# Patient Record
Sex: Male | Born: 1965 | Race: White | Hispanic: No | Marital: Married | Smoking: Never smoker
Health system: Southern US, Community
[De-identification: ages and names within clinical notes are randomized; demographics above are authoritative.]

## PROBLEM LIST (undated history)

## (undated) DIAGNOSIS — I350 Nonrheumatic aortic (valve) stenosis: Secondary | ICD-10-CM

## (undated) DIAGNOSIS — R112 Nausea with vomiting, unspecified: Secondary | ICD-10-CM

## (undated) DIAGNOSIS — Z9889 Other specified postprocedural states: Secondary | ICD-10-CM

## (undated) DIAGNOSIS — E119 Type 2 diabetes mellitus without complications: Secondary | ICD-10-CM

## (undated) DIAGNOSIS — I10 Essential (primary) hypertension: Secondary | ICD-10-CM

## (undated) HISTORY — DX: Type 2 diabetes mellitus without complications: E11.9

## (undated) HISTORY — DX: Essential (primary) hypertension: I10

## (undated) HISTORY — DX: Nonrheumatic aortic (valve) stenosis: I35.0

---

## 2011-03-25 ENCOUNTER — Ambulatory Visit: Payer: Worker's Compensation

## 2011-04-05 ENCOUNTER — Ambulatory Visit: Payer: Worker's Compensation

## 2011-04-14 ENCOUNTER — Ambulatory Visit: Payer: Worker's Compensation

## 2012-06-01 ENCOUNTER — Ambulatory Visit (INDEPENDENT_AMBULATORY_CARE_PROVIDER_SITE_OTHER): Payer: Self-pay | Admitting: Physician Assistant

## 2012-06-01 VITALS — BP 120/82 | HR 71 | Temp 98.4°F | Resp 17 | Ht 65.0 in | Wt 236.0 lb

## 2012-06-01 DIAGNOSIS — Z0289 Encounter for other administrative examinations: Secondary | ICD-10-CM

## 2012-06-01 DIAGNOSIS — E119 Type 2 diabetes mellitus without complications: Secondary | ICD-10-CM

## 2012-06-01 LAB — POCT GLYCOSYLATED HEMOGLOBIN (HGB A1C): Hemoglobin A1C: 6.7

## 2012-06-01 NOTE — Progress Notes (Signed)
  Subjective:    Patient ID: Jonathon Bray, male    DOB: [birth year], 47 y.o.   MRN: [id number]  HPI   Jonathon Bray is a 47 yr old male here for DOT exam.  He is currently treated for DM by his PCP, Dr. Kirstie Mirza.  He is treated with Glucovance only.  Pt is working on his diet, eating out less.  Has lost about 30 lbs in the last several months.  Hopeful he can get off diabetes medication.  States fasting blood sugars run in 120s (previoulsy 160s-170s).  His next appt with PCP is 06/22/2012, states he follows up every three months.  He does not wear corrective lenses.  Eye doctor appt next month as well.  Denies any other medical problems or medications.    Pt drives a service truck locally.   Review of Systems  All other systems reviewed and are negative.       Objective:   Physical Exam  Vitals reviewed. Constitutional: He is oriented to person, place, and time. He appears well-developed and well-nourished. No distress.  HENT:  Head: Normocephalic and atraumatic.  Right Ear: Tympanic membrane and ear canal normal.  Left Ear: Tympanic membrane and ear canal normal.  Mouth/Throat: Uvula is midline, oropharynx is clear and moist and mucous membranes are normal.  Eyes: Conjunctivae and EOM are normal. Pupils are equal, round, and reactive to light. No scleral icterus.  Neck: Normal range of motion. Neck supple.  Cardiovascular: Normal rate, regular rhythm, normal heart sounds and intact distal pulses.  Exam reveals no gallop and no friction rub.   No murmur heard. Pulmonary/Chest: Effort normal and breath sounds normal. He has no wheezes. He has no rales.  Abdominal: Soft. Bowel sounds are normal. He exhibits no distension and no mass. There is no tenderness. There is no rebound and no guarding. Hernia confirmed negative in the right inguinal area and confirmed negative in the left inguinal area.  Musculoskeletal: Normal range of motion. He exhibits no edema.       Cervical back: Normal.   Thoracic back: Normal.       Lumbar back: Normal.  Lymphadenopathy:    He has no cervical adenopathy.  Neurological: He is alert and oriented to person, place, and time. He has normal strength. No cranial nerve deficit or sensory deficit.  Skin: Skin is warm and dry.  Psychiatric: His behavior is normal.     Filed Vitals:   06/01/12 0824  BP: 120/82  Pulse: 71  Temp: 98.4 F (36.9 C)  Resp: 17        Assessment & Plan:  Health examination of defined subpopulation  Diabetes mellitus - Plan: POCT glycosylated hemoglobin (Hb A1C), POCT glucose (manual entry)   Jonathon Bray is a 47 yr old male here for DOT exam.  Pt with DM treated with oral agent only.  1000mg  sugar on urine dip.  A1C is 6.7 today indicating adequate control of DM.  PE is WNL.  Ok to certify for 1 yr.  DOT card signed.  Encouraged pt to continue making lifestyle changes and to follow up with primary as planned.

## 2012-06-12 ENCOUNTER — Encounter: Payer: Self-pay | Admitting: Physician Assistant

## 2014-01-24 ENCOUNTER — Inpatient Hospital Stay (HOSPITAL_COMMUNITY): Payer: 59

## 2014-01-24 ENCOUNTER — Inpatient Hospital Stay (HOSPITAL_COMMUNITY)
Admission: EM | Admit: 2014-01-24 | Discharge: 2014-01-25 | DRG: 069 | Disposition: A | Discharge status: Home / Self Care | Payer: 59 | Attending: Internal Medicine | Admitting: Internal Medicine

## 2014-01-24 ENCOUNTER — Encounter (HOSPITAL_COMMUNITY): Payer: Self-pay | Admitting: Emergency Medicine

## 2014-01-24 ENCOUNTER — Emergency Department (HOSPITAL_COMMUNITY): Payer: 59

## 2014-01-24 DIAGNOSIS — D45 Polycythemia vera: Secondary | ICD-10-CM | POA: Diagnosis present

## 2014-01-24 DIAGNOSIS — E119 Type 2 diabetes mellitus without complications: Secondary | ICD-10-CM

## 2014-01-24 DIAGNOSIS — G459 Transient cerebral ischemic attack, unspecified: Secondary | ICD-10-CM | POA: Diagnosis present

## 2014-01-24 DIAGNOSIS — E860 Dehydration: Secondary | ICD-10-CM | POA: Diagnosis present

## 2014-01-24 DIAGNOSIS — E781 Pure hyperglyceridemia: Secondary | ICD-10-CM | POA: Diagnosis present

## 2014-01-24 DIAGNOSIS — I10 Essential (primary) hypertension: Secondary | ICD-10-CM | POA: Diagnosis present

## 2014-01-24 DIAGNOSIS — H538 Other visual disturbances: Secondary | ICD-10-CM | POA: Diagnosis present

## 2014-01-24 DIAGNOSIS — E1165 Type 2 diabetes mellitus with hyperglycemia: Secondary | ICD-10-CM | POA: Diagnosis present

## 2014-01-24 DIAGNOSIS — R531 Weakness: Secondary | ICD-10-CM

## 2014-01-24 DIAGNOSIS — R29898 Other symptoms and signs involving the musculoskeletal system: Secondary | ICD-10-CM

## 2014-01-24 DIAGNOSIS — R42 Dizziness and giddiness: Secondary | ICD-10-CM | POA: Diagnosis present

## 2014-01-24 DIAGNOSIS — Z79899 Other long term (current) drug therapy: Secondary | ICD-10-CM

## 2014-01-24 DIAGNOSIS — I639 Cerebral infarction, unspecified: Secondary | ICD-10-CM

## 2014-01-24 LAB — URINALYSIS, ROUTINE W REFLEX MICROSCOPIC
Bilirubin Urine: NEGATIVE
Hgb urine dipstick: NEGATIVE
Ketones, ur: NEGATIVE mg/dL
LEUKOCYTES UA: NEGATIVE
Nitrite: NEGATIVE
PH: 5 (ref 5.0–8.0)
Protein, ur: NEGATIVE mg/dL
Specific Gravity, Urine: 1.045 — ABNORMAL HIGH (ref 1.005–1.030)
Urobilinogen, UA: 0.2 mg/dL (ref 0.0–1.0)

## 2014-01-24 LAB — BASIC METABOLIC PANEL
Anion gap: 12 (ref 5–15)
BUN: 20 mg/dL (ref 6–23)
CO2: 26 meq/L (ref 19–32)
Calcium: 9.7 mg/dL (ref 8.4–10.5)
Chloride: 101 mEq/L (ref 96–112)
Creatinine, Ser: 0.9 mg/dL (ref 0.50–1.35)
GFR calc Af Amer: >90 mL/min (ref >90)
GFR calc non Af Amer: >90 mL/min (ref >90)
GLUCOSE: 249 mg/dL — AB (ref 70–99)
POTASSIUM: 4.9 meq/L (ref 3.7–5.3)
Sodium: 139 mEq/L (ref 137–147)

## 2014-01-24 LAB — CBC WITH DIFFERENTIAL/PLATELET
BASOS PCT: 0 % (ref 0–1)
Basophils Absolute: 0 10*3/uL (ref 0.0–0.1)
Eosinophils Absolute: 0.3 10*3/uL (ref 0.0–0.7)
Eosinophils Relative: 4 % (ref 0–5)
HCT: 50.7 % (ref 39.0–52.0)
HEMOGLOBIN: 18.5 g/dL — AB (ref 13.0–17.0)
LYMPHS ABS: 1.5 10*3/uL (ref 0.7–4.0)
LYMPHS PCT: 20 % (ref 12–46)
MCH: 30.7 pg (ref 26.0–34.0)
MCHC: 36.5 g/dL — AB (ref 30.0–36.0)
MCV: 84.2 fL (ref 78.0–100.0)
MONOS PCT: 6 % (ref 3–12)
Monocytes Absolute: 0.4 10*3/uL (ref 0.1–1.0)
NEUTROS PCT: 70 % (ref 43–77)
Neutro Abs: 5.1 10*3/uL (ref 1.7–7.7)
Platelets: 190 10*3/uL (ref 150–400)
RBC: 6.02 MIL/uL — AB (ref 4.22–5.81)
RDW: 12.4 % (ref 11.5–15.5)
WBC: 7.3 10*3/uL (ref 4.0–10.5)

## 2014-01-24 LAB — RAPID URINE DRUG SCREEN, HOSP PERFORMED
Amphetamines: NOT DETECTED
BENZODIAZEPINES: NOT DETECTED
Barbiturates: NOT DETECTED
Cocaine: NOT DETECTED
Opiates: NOT DETECTED
Tetrahydrocannabinol: NOT DETECTED

## 2014-01-24 LAB — I-STAT CHEM 8, ED
BUN: 22 mg/dL (ref 6–23)
CREATININE: 1 mg/dL (ref 0.50–1.35)
Calcium, Ion: 1.26 mmol/L — ABNORMAL HIGH (ref 1.12–1.23)
Chloride: 102 mEq/L (ref 96–112)
Glucose, Bld: 251 mg/dL — ABNORMAL HIGH (ref 70–99)
HEMATOCRIT: 55 % — AB (ref 39.0–52.0)
HEMOGLOBIN: 18.7 g/dL — AB (ref 13.0–17.0)
POTASSIUM: 4.4 meq/L (ref 3.7–5.3)
SODIUM: 139 meq/L (ref 137–147)
TCO2: 27 mmol/L (ref 0–100)

## 2014-01-24 LAB — GLUCOSE, CAPILLARY
Glucose-Capillary: 236 mg/dL — ABNORMAL HIGH (ref 70–99)
Glucose-Capillary: 262 mg/dL — ABNORMAL HIGH (ref 70–99)

## 2014-01-24 LAB — I-STAT TROPONIN, ED: Troponin i, poc: 0 ng/mL (ref 0.00–0.08)

## 2014-01-24 LAB — PROTIME-INR
INR: 1.06 (ref 0.00–1.49)
Prothrombin Time: 13.9 seconds (ref 11.6–15.2)

## 2014-01-24 LAB — URINE MICROSCOPIC-ADD ON
RBC / HPF: NONE SEEN RBC/hpf (ref <3)
WBC, UA: NONE SEEN WBC/hpf (ref <3)

## 2014-01-24 LAB — HEMOGLOBIN A1C
Hgb A1c MFr Bld: 7.8 % — ABNORMAL HIGH (ref <5.7)
MEAN PLASMA GLUCOSE: 177 mg/dL — AB (ref <117)

## 2014-01-24 LAB — ETHANOL: Alcohol, Ethyl (B): <11 mg/dL (ref 0–11)

## 2014-01-24 LAB — APTT: APTT: 27 s (ref 24–37)

## 2014-01-24 MED ORDER — INSULIN ASPART 100 UNIT/ML ~~LOC~~ SOLN
0.0000 [IU] | Freq: Three times a day (TID) | SUBCUTANEOUS | Status: DC
  Administered 2014-01-24: 3 [IU] via SUBCUTANEOUS
  Administered 2014-01-25: 2 [IU] via SUBCUTANEOUS
  Administered 2014-01-25 (×2): 3 [IU] via SUBCUTANEOUS

## 2014-01-24 MED ORDER — IOHEXOL 350 MG/ML SOLN
80.0000 mL | Freq: Once | INTRAVENOUS | Status: AC | PRN
  Administered 2014-01-24: 80 mL via INTRAVENOUS

## 2014-01-24 MED ORDER — SODIUM CHLORIDE 0.9 % IV SOLN
Freq: Once | INTRAVENOUS | Status: AC
  Administered 2014-01-24: 11:00:00 via INTRAVENOUS

## 2014-01-24 MED ORDER — STROKE: EARLY STAGES OF RECOVERY BOOK
Freq: Once | Status: AC
  Administered 2014-01-25: 08:00:00
  Filled 2014-01-24 (×2): qty 1

## 2014-01-24 MED ORDER — ASPIRIN 325 MG PO TABS
325.0000 mg | ORAL_TABLET | Freq: Once | ORAL | Status: AC
  Administered 2014-01-24: 325 mg via ORAL
  Filled 2014-01-24: qty 1

## 2014-01-24 MED ORDER — METOCLOPRAMIDE HCL 5 MG/ML IJ SOLN
10.0000 mg | Freq: Once | INTRAMUSCULAR | Status: AC
  Administered 2014-01-24: 10 mg via INTRAVENOUS
  Filled 2014-01-24: qty 2

## 2014-01-24 MED ORDER — HEPARIN SODIUM (PORCINE) 5000 UNIT/ML IJ SOLN
5000.0000 [IU] | Freq: Three times a day (TID) | INTRAMUSCULAR | Status: DC
  Administered 2014-01-24 – 2014-01-25 (×3): 5000 [IU] via SUBCUTANEOUS
  Filled 2014-01-24 (×3): qty 1

## 2014-01-24 MED ORDER — ASPIRIN EC 81 MG PO TBEC
81.0000 mg | DELAYED_RELEASE_TABLET | Freq: Every day | ORAL | Status: DC
Start: 2014-01-24 — End: 2014-01-24

## 2014-01-24 MED ORDER — ASPIRIN EC 81 MG PO TBEC
81.0000 mg | DELAYED_RELEASE_TABLET | Freq: Every day | ORAL | Status: DC
  Administered 2014-01-25: 81 mg via ORAL
  Filled 2014-01-24: qty 1

## 2014-01-24 MED ORDER — ACETAMINOPHEN 325 MG PO TABS
650.0000 mg | ORAL_TABLET | Freq: Once | ORAL | Status: AC
  Administered 2014-01-24: 650 mg via ORAL
  Filled 2014-01-24: qty 2

## 2014-01-24 NOTE — ED Notes (Signed)
Patient states dizziness while driving this am, patient states once he pulled over he could not stand due to leg weakness, at time of arrival all symptoms improved except patient now c/o posterior headache and right arm heaviness, no drift noted, +pms in all extremities,

## 2014-01-24 NOTE — ED Notes (Signed)
Patient transported to MRI 

## 2014-01-24 NOTE — ED Notes (Signed)
Pt c/o near syncope with dizziness today while driving; pt sts pain in right arm and neck

## 2014-01-24 NOTE — ED Provider Notes (Signed)
CSN: [id number]     Arrival date & time 01/24/14  9381 History   First MD Initiated Contact with Patient 01/24/14 0912     Chief Complaint  Patient presents with  . Near Syncope  . Dizziness     HPI Patient states he was in his normal state of health this morning when he was driving down the road and felt a sudden sense of spinning and then "darkness" came over him.  He did not have syncope.  No chest pain or palpitations.  Pulled his car over quickly in an attempt to get out of his car at which point he fell forward secondary to "dizziness".  He now reports weakness and heaviness of his right arm.  He denies weakness of his lower extremities.  He reports mild headache.  He also reports some posterior neck and head pain.  He's never had symptoms like this before.  He has a history of hypertension and diabetes and is compliant with his medications.  He has recently lost 30 pounds over the past year.  He does not smoke cigarettes.  No prior history of stroke.  No history of headaches.  He does report headache at this time.  Last known well time equals 7 AM   Past Medical History  Diagnosis Date  . 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 Hypertension    History reviewed. No pertinent past surgical history. History reviewed. No pertinent family history. History  Substance Use Topics  . Smoking status: Never Smoker   . Smokeless tobacco: Never Used  . Alcohol Use: No    Review of Systems  All other systems reviewed and are negative.     Allergies  Review of patient's allergies indicates no known allergies.  Home Medications   Prior to Admission medications   Medication Sig Start Date End Date Taking? Authorizing Provider  Canagliflozin (INVOKANA) 300 MG TABS Take 300 mg by mouth daily.   Yes Historical Provider, MD  lisinopril (PRINIVIL,ZESTRIL) 5 MG tablet Take 2.5 mg by mouth every other day.   Yes Historical Provider, MD  metFORMIN (GLUCOPHAGE) 500 MG tablet  Take 500-1,000 mg by mouth 2 (two) times daily with a meal. 1000mg  every morning , and 500mg  in the evening with meals.   Yes Historical Provider, MD   BP 136/89  Pulse 89  Temp(Src) 97.2 F (36.2 C) (Oral)  Resp 18  Ht 5\' 5"  (1.651 m)  SpO2 96% Physical Exam  Nursing note and vitals reviewed. Constitutional: He is oriented to person, place, and time. He appears well-developed and well-nourished.  HENT:  Head: Normocephalic and atraumatic.  Eyes: EOM are normal. Pupils are equal, round, and reactive to light.  Neck: Normal range of motion.  Cardiovascular: Normal rate, regular rhythm, normal heart sounds and intact distal pulses.   Pulmonary/Chest: Effort normal and breath sounds normal. No respiratory distress.  Abdominal: Soft. He exhibits no distension. There is no tenderness.  Musculoskeletal: Normal range of motion.  Neurological: He is alert and oriented to person, place, and time.  Mild weakness of his right upper extremity as compared to his left.  No obvious pronator drift Speech normal. No facial asymetry.   Skin: Skin is warm and dry.  Psychiatric: He has a normal mood and affect. Judgment normal.    ED Course  Procedures (including critical care time)  CRITICAL CARE Performed by: Hoy Morn Total critical care time: 33 Critical care time was exclusive of separately billable  procedures and treating other patients. Critical care was necessary to treat or prevent imminent or life-threatening deterioration. Critical care was time spent personally by me on the following activities: development of treatment plan with patient and/or surrogate as well as nursing, discussions with consultants, evaluation of patient's response to treatment, examination of patient, obtaining history from patient or surrogate, ordering and performing treatments and interventions, ordering and review of laboratory studies, ordering and review of radiographic studies, pulse oximetry and  re-evaluation of patient's condition.   Labs Review Labs Reviewed  BASIC METABOLIC PANEL - Abnormal; Notable for the following:    Glucose, Bld 249 (*)    All other components within normal limits  CBC WITH DIFFERENTIAL - Abnormal; Notable for the following:    RBC 6.02 (*)    Hemoglobin 18.5 (*)    MCHC 36.5 (*)    All other components within normal limits  I-STAT CHEM 8, ED - Abnormal; Notable for the following:    Glucose, Bld 251 (*)    Calcium, Ion 1.26 (*)    Hemoglobin 18.7 (*)    HCT 55.0 (*)    All other components within normal limits  ETHANOL  PROTIME-INR  APTT  URINE RAPID DRUG SCREEN (HOSP PERFORMED)  URINALYSIS, ROUTINE W REFLEX MICROSCOPIC  I-STAT TROPOININ, ED  I-STAT TROPOININ, ED  Randolm Idol, ED    Imaging Review Dg Chest 2 View  01/24/2014   CLINICAL DATA:  Near syncope, dizziness.  EXAM: CHEST  2 VIEW  COMPARISON:  None.  FINDINGS: The heart size and mediastinal contours are within normal limits. Both lungs are clear. No pneumothorax or pleural effusion is noted. The visualized skeletal structures are unremarkable.  IMPRESSION: No acute cardiopulmonary abnormality seen.   Electronically Signed   By: Sabino Dick M.D.   On: 01/24/2014 09:10   Ct Head Wo Contrast  01/24/2014   CLINICAL DATA:  Code stroke, right arm weakness, near syncope  EXAM: CT HEAD WITHOUT CONTRAST  TECHNIQUE: Contiguous axial images were obtained from the base of the skull through the vertex without intravenous contrast.  COMPARISON:  None.  FINDINGS: There is no evidence of mass effect, midline shift or extra-axial fluid collections. There is no evidence of a space-occupying lesion or intracranial hemorrhage. There is no evidence of a cortical-based area of acute infarction.  The ventricles and sulci are appropriate for the patient's age. The basal cisterns are patent.  Visualized portions of the orbits are unremarkable. The visualized portions of the paranasal sinuses and mastoid air  cells are unremarkable.  The osseous structures are unremarkable.  IMPRESSION: No acute intracranial pathology. These results were called by telephone at the time of interpretation on 01/24/2014 at 9:54 am to Dr. Aram Beecham, who verbally acknowledged these results.   Electronically Signed   By: Kathreen Devoid   On: 01/24/2014 09:57  I personally reviewed the imaging tests through PACS system I reviewed available ER/hospitalization records through the EMR    EKG Interpretation   Date/Time:  Thursday January 24 2014 08:45:24 EDT Ventricular Rate:  89 PR Interval:  150 QRS Duration: 80 QT Interval:  352 QTC Calculation: 428 R Axis:   103 Text Interpretation:  Normal sinus rhythm Rightward axis Borderline ECG No  significant change was found Confirmed by Amellia Panik  MD, Bridie Colquhoun ([id number]) on  01/24/2014 10:09:09 AM      MDM   Final diagnoses:  Dizziness   Good stroke called on my valuation of the patient given his right arm weakness Initial  head CT negative.  Sent for CTA of the head and neck to evaluate for dissection.  No obvious abnormalities noted.  Patient will be admitted to the hospital for stroke workup.  He appears to be having some improvement in his right arm weakness at this time.  Aspirin will be given.    Hoy Morn, MD 01/24/14 [phone]

## 2014-01-24 NOTE — Consult Note (Signed)
Referring Physician: ED    Chief Complaint: CODE STROKE, RIGHT ARM WEAKNESS, BLURRED VI SON, DIZZINESS, HA  HPI:                                                                                                                                         Jonathon Bray is an 48 y.o. male with a past medical history significant for HTN, DM, brought in via EMS due to acute onset of the above stated symptoms. He [place] the [place] when suddenly experienced blurred vision, dizziness, and heaviness/weakness of the right arm. Stated that at some point he fell and hit his head and ever since has ben having headache and pain in the back of his neck. Denies double vision, difficulty swallowing, slurred speech, confusion, or language impairment. NIHSS 1. CT brain showed no acute intracranial abnormality. Date last known well: 01/24/14 Time last known well:  tPA Given: no, mild deficits NIHSS: 1   Past Medical History  Diagnosis Date  . 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 Hypertension     History reviewed. No pertinent past surgical history.  History reviewed. No pertinent family history. Social History:  reports that he has never smoked. He has never used smokeless tobacco. He reports that he does not drink alcohol or use illicit drugs.  Allergies: No Known Allergies  Medications:                                                                                                                           I have reviewed the patient's current medications.  ROS:  History obtained from the patient and chart review  General ROS: negative for - chills, fatigue, fever, night sweats,or weight loss Psychological ROS: negative for - behavioral disorder, hallucinations, memory difficulties, mood swings or suicidal ideation Ophthalmic ROS:  negative for - blurry vision, double vision, eye pain or loss of vision ENT ROS: negative for - epistaxis, nasal discharge, oral lesions, sore throat, tinnitus or vertigo Allergy and Immunology ROS: negative for - hives or itchy/watery eyes Hematological and Lymphatic ROS: negative for - bleeding problems, bruising or swollen lymph nodes Endocrine ROS: negative for - galactorrhea, hair pattern changes, polydipsia/polyuria or temperature intolerance Respiratory ROS: negative for - cough, hemoptysis, shortness of breath or wheezing Cardiovascular ROS: negative for - chest pain, dyspnea on exertion, edema or irregular heartbeat Gastrointestinal ROS: negative for - abdominal pain, diarrhea, hematemesis, nausea/vomiting or stool incontinence Genito-Urinary ROS: negative for - dysuria, hematuria, incontinence or urinary frequency/urgency Musculoskeletal ROS: negative for - joint swelling Neurological ROS: as noted in HPI Dermatological ROS: negative for rash and skin lesion changes  Physical exam: pleasant male in no apparent distress. Blood pressure 136/89, pulse 89, temperature 97.2 F (36.2 C), temperature source Oral, resp. rate 18, height 5' 5"  (1.651 m), SpO2 96.00%. Head: normocephalic. Neck: supple, no bruits, no JVD. Cardiac: no murmurs. Lungs: clear. Abdomen: soft, no tender, no mass. Extremities: no edema. Neurologic Examination:                                                                                                      General: Mental Status: Alert, oriented, thought content appropriate.  Speech fluent without evidence of aphasia.  Able to follow 3 step commands without difficulty. Cranial Nerves: II: Discs flat bilaterally; Visual fields grossly normal, pupils equal, round, reactive to light and accommodation III,IV, VI: ptosis not present, extra-ocular motions intact bilaterally V,VII: smile asymmetric die to mild right lower face weakness, facial light touch sensation  normal bilaterally VIII: hearing normal bilaterally IX,X: gag reflex present XI: bilateral shoulder shrug XII: midline tongue extension without atrophy or fasciculations Motor: Right : Upper extremity   5/5    Left:     Upper extremity   5/5  Lower extremity   5/5     Lower extremity   5/5 Tone and bulk:normal tone throughout; no atrophy noted Sensory: Pinprick and light touch intact throughout, bilaterally Deep Tendon Reflexes:  Right: Upper Extremity   Left: Upper extremity   biceps (C-5 to C-6) 2/4   biceps (C-5 to C-6) 2/4 tricep (C7) 2/4    triceps (C7) 2/4 Brachioradialis (C6) 2/4  Brachioradialis (C6) 2/4  Lower Extremity Lower Extremity  quadriceps (L-2 to L-4) 2/4   quadriceps (L-2 to L-4) 2/4 Achilles (S1) 2/4   Achilles (S1) 2/4  Plantars: Right: downgoing   Left: downgoing Cerebellar: normal finger-to-nose,  normal heel-to-shin test Gait:  No tested    Results for orders placed during the hospital encounter of 01/24/14 (from the past 48 hour(s))  BASIC METABOLIC PANEL     Status: Abnormal (Preliminary result)   Collection Time    01/24/14  8:59 AM      Result Value Ref Range   Sodium 139  137 - 147 mEq/L   Potassium PENDING  3.7 - 5.3 mEq/L   Chloride 101  96 - 112 mEq/L   CO2 26  19 - 32 mEq/L   Glucose, Bld 249 (*) 70 - 99 mg/dL   BUN 20  6 - 23 mg/dL   Creatinine, Ser 0.90  0.50 - 1.35 mg/dL   Calcium 9.7  8.4 - 10.5 mg/dL   GFR calc non Af Amer >90  >90 mL/min   GFR calc Af Amer >90  >90 mL/min   Comment: (NOTE)     The eGFR has been calculated using the CKD EPI equation.     This calculation has not been validated in all clinical situations.     eGFR's persistently <90 mL/min signify possible Chronic Kidney     Disease.   Anion gap 12  5 - 15  CBC WITH DIFFERENTIAL     Status: Abnormal   Collection Time    01/24/14  8:59 AM      Result Value Ref Range   WBC 7.3  4.0 - 10.5 K/uL   RBC 6.02 (*) 4.22 - 5.81 MIL/uL   Hemoglobin 18.5 (*) 13.0 -  17.0 g/dL   HCT 50.7  39.0 - 52.0 %   MCV 84.2  78.0 - 100.0 fL   MCH 30.7  26.0 - 34.0 pg   MCHC 36.5 (*) 30.0 - 36.0 g/dL   RDW 12.4  11.5 - 15.5 %   Platelets 190  150 - 400 K/uL   Neutrophils Relative % 70  43 - 77 %   Neutro Abs 5.1  1.7 - 7.7 K/uL   Lymphocytes Relative 20  12 - 46 %   Lymphs Abs 1.5  0.7 - 4.0 K/uL   Monocytes Relative 6  3 - 12 %   Monocytes Absolute 0.4  0.1 - 1.0 K/uL   Eosinophils Relative 4  0 - 5 %   Eosinophils Absolute 0.3  0.0 - 0.7 K/uL   Basophils Relative 0  0 - 1 %   Basophils Absolute 0.0  0.0 - 0.1 K/uL  I-STAT TROPOININ, ED     Status: None   Collection Time    01/24/14  9:04 AM      Result Value Ref Range   Troponin i, poc 0.00  0.00 - 0.08 ng/mL   Comment 3            Comment: Due to the release kinetics of cTnI,     a negative result within the first hours     of the onset of symptoms does not rule out     myocardial infarction with certainty.     If myocardial infarction is still suspected,     repeat the test at appropriate intervals.  I-STAT CHEM 8, ED     Status: Abnormal   Collection Time    01/24/14  9:45 AM      Result Value Ref Range   Sodium 139  137 - 147 mEq/L   Potassium 4.4  3.7 - 5.3 mEq/L   Chloride 102  96 - 112 mEq/L   BUN 22  6 - 23 mg/dL   Creatinine, Ser 1.00  0.50 - 1.35 mg/dL   Glucose, Bld 251 (*) 70 - 99 mg/dL   Calcium, Ion 1.26 (*) 1.12 - 1.23 mmol/L   TCO2 27  0 - 100 mmol/L  Hemoglobin 18.7 (*) 13.0 - 17.0 g/dL   HCT 55.0 (*) 39.0 - 52.0 %   Dg Chest 2 View  01/24/2014   CLINICAL DATA:  Near syncope, dizziness.  EXAM: CHEST  2 VIEW  COMPARISON:  None.  FINDINGS: The heart size and mediastinal contours are within normal limits. Both lungs are clear. No pneumothorax or pleural effusion is noted. The visualized skeletal structures are unremarkable.  IMPRESSION: No acute cardiopulmonary abnormality seen.   Electronically Signed   By: Sabino Dick M.D.   On: 01/24/2014 09:10   Ct Head Wo  Contrast  01/24/2014   CLINICAL DATA:  Code stroke, right arm weakness, near syncope  EXAM: CT HEAD WITHOUT CONTRAST  TECHNIQUE: Contiguous axial images were obtained from the base of the skull through the vertex without intravenous contrast.  COMPARISON:  None.  FINDINGS: There is no evidence of mass effect, midline shift or extra-axial fluid collections. There is no evidence of a space-occupying lesion or intracranial hemorrhage. There is no evidence of a cortical-based area of acute infarction.  The ventricles and sulci are appropriate for the patient's age. The basal cisterns are patent.  Visualized portions of the orbits are unremarkable. The visualized portions of the paranasal sinuses and mastoid air cells are unremarkable.  The osseous structures are unremarkable.  IMPRESSION: No acute intracranial pathology. These results were called by telephone at the time of interpretation on 01/24/2014 at 9:54 am to Dr. Aram Beecham, who verbally acknowledged these results.   Electronically Signed   By: Kathreen Devoid   On: 01/24/2014 09:57     Assessment: 48 y.o. male brought in as a code stroke due to acute onset of blurred vision, right arm weakness, dizziness, and HA. NIHSS 1. CT brain negative for acute abnormality. Deficits are minimal and thus will not pursue thrombolysis. He hit his head and thus concern about dissection: requested CTA brain and neck. Will need admission to medicine and complete stroke work up. Aspirin. Stroke team will follow up in am  Stroke Risk Factors -  HTN, DM.  Plan: 1. HgbA1c, fasting lipid panel 2. MRI of the brain without contrast 3. Echocardiogram 4. Carotid dopplers 5. Prophylactic therapy-aspirin 6. Risk factor modification 7. Telemetry monitoring 8. Frequent neuro checks 9. PT/OT SLP  [facility] [phone]  01/24/2014, 10:00 AM  Addendum: unremarkable CTA brain and neck.
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
## 2014-01-24 NOTE — ED Notes (Signed)
Pt transported to CT with stroke and Rapid Response RNs
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
## 2014-01-24 NOTE — ED Notes (Addendum)
Patient transported to MRI 

## 2014-01-24 NOTE — Progress Notes (Signed)
Patient admitted from the ED for stroke work up. Alert and oriented and no neuro deficit observed. Oriented to the room and call bell placed within reach. Cardiac monitoring set up as well.

## 2014-01-24 NOTE — ED Notes (Signed)
Back from CT

## 2014-01-25 ENCOUNTER — Encounter (HOSPITAL_COMMUNITY): Payer: Self-pay | Admitting: Emergency Medicine

## 2014-01-25 DIAGNOSIS — I6789 Other cerebrovascular disease: Secondary | ICD-10-CM

## 2014-01-25 LAB — LIPID PANEL
CHOLESTEROL: 159 mg/dL (ref 0–200)
HDL: 40 mg/dL (ref >39)
LDL CALC: 74 mg/dL (ref 0–99)
TRIGLYCERIDES: 223 mg/dL — AB (ref <150)
Total CHOL/HDL Ratio: 4 RATIO
VLDL: 45 mg/dL — ABNORMAL HIGH (ref 0–40)

## 2014-01-25 LAB — BASIC METABOLIC PANEL
Anion gap: 11 (ref 5–15)
BUN: 16 mg/dL (ref 6–23)
CO2: 25 mEq/L (ref 19–32)
Calcium: 9.2 mg/dL (ref 8.4–10.5)
Chloride: 100 mEq/L (ref 96–112)
Creatinine, Ser: 0.89 mg/dL (ref 0.50–1.35)
GFR calc Af Amer: >90 mL/min (ref >90)
GLUCOSE: 253 mg/dL — AB (ref 70–99)
POTASSIUM: 4.8 meq/L (ref 3.7–5.3)
SODIUM: 136 meq/L — AB (ref 137–147)

## 2014-01-25 LAB — HEMOGLOBIN A1C
Hgb A1c MFr Bld: 7.7 % — ABNORMAL HIGH (ref <5.7)
Mean Plasma Glucose: 174 mg/dL — ABNORMAL HIGH (ref <117)

## 2014-01-25 LAB — CBC
HCT: 49.4 % (ref 39.0–52.0)
HEMOGLOBIN: 17.8 g/dL — AB (ref 13.0–17.0)
MCH: 29.9 pg (ref 26.0–34.0)
MCHC: 36 g/dL (ref 30.0–36.0)
MCV: 82.9 fL (ref 78.0–100.0)
PLATELETS: 168 10*3/uL (ref 150–400)
RBC: 5.96 MIL/uL — ABNORMAL HIGH (ref 4.22–5.81)
RDW: 12.5 % (ref 11.5–15.5)
WBC: 7.8 10*3/uL (ref 4.0–10.5)

## 2014-01-25 LAB — GLUCOSE, CAPILLARY
GLUCOSE-CAPILLARY: 204 mg/dL — AB (ref 70–99)
GLUCOSE-CAPILLARY: 209 mg/dL — AB (ref 70–99)
Glucose-Capillary: 194 mg/dL — ABNORMAL HIGH (ref 70–99)

## 2014-01-25 NOTE — Progress Notes (Signed)
Bilateral carotid artery duplex completed:  1-39% ICA stenosis.  Vertebral artery flow is antegrade.     
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
## 2014-01-25 NOTE — Progress Notes (Signed)
  Echocardiogram 2D Echocardiogram has been performed.  Jonathon Bray 01/25/2014, 12:13 PM

## 2014-01-25 NOTE — Progress Notes (Signed)
Inpatient Diabetes Program Recommendations  AACE/ADA: New Consensus Statement on Inpatient Glycemic Control (2013)  Target Ranges:  Prepandial:   less than 140 mg/dL      Peak postprandial:   less than 180 mg/dL (1-2 hours)      Critically ill patients:  140 - 180 mg/dL   While here in hospital: Inpatient Diabetes Program Recommendations Insulin - Basal: Please consider addition of basal lantus or levemir. At 0.2 units/kg, pt would require 21 units daily. However, coukld start low with 10-15 units.  Thank you, Rosita Kea, RN, CNS, Diabetes Coordinator [phone])

## 2014-01-25 NOTE — Progress Notes (Signed)
Pt is being discharged home with family. Discharge instructions were given to patient and family.
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
## 2014-01-25 NOTE — Progress Notes (Signed)
SLP Cancellation Note  Patient Details Name: Jonathon Bray MRN: [id number] DOB: [birth year]   Cancelled treatment:        Patient unavailable during the two times SLP attempted to see him today for SLE, getting ECHO, then in vascular labs. SLP will attempt tomorrow (01/26/14)   Dannial Monarch 01/25/2014, 5:25 PM
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
## 2014-01-25 NOTE — Progress Notes (Addendum)
Subjective: No complaints this AM. He feels much better and would like to follow-up with his PCP to discuss other medications to try besides Invokana. He agrees to make sure he keeps up adequate fluid intake.  Objective: Vital signs in last 24 hours: Filed Vitals:   01/25/14 0400 01/25/14 1022 01/25/14 1100 01/25/14 1326  BP: 136/85 134/102  125/80  Pulse: 82 74  84  Temp: 98.2 F (36.8 C) 98.3 F (36.8 C)  98.2 F (36.8 C)  TempSrc: Oral Oral  Oral  Resp: 16 16  16   Height:   5\' 6"  (1.676 m)   Weight:   235 lb (106.595 kg)   SpO2: 100% 96%  97%   Weight change:   Intake/Output Summary (Last 24 hours) at 01/25/14 1436 Last data filed at 01/24/14 2221  Gross per 24 hour  Intake      0 ml  Output    600 ml  Net   -600 ml   General: resting in bed, NAD HEENT: PERRL, EOMI, no scleral icterus Cardiac: RRR, no rubs, murmurs or gallops Pulm: clear to auscultation bilaterally, no wheezes, rales, or rhonchi Abd: soft, nontender, nondistended, BS present Ext: warm and well perfused, no pedal edema Neuro: alert and oriented X3, cranial nerves II-XII intact, grip strength 2+, UE/LE strength 5/5, strength and sensation to light touch equal in bilateral upper and lower extremities   Lab Results: Basic Metabolic Panel:  Recent Labs Lab 01/24/14 0859 01/24/14 0945 01/25/14 0820  NA 139 139 136*  K 4.9 4.4 4.8  CL 101 102 100  CO2 26  --  25  GLUCOSE 249* 251* 253*  BUN 20 22 16   CREATININE 0.90 1.00 0.89  CALCIUM 9.7  --  9.2   CBC:  Recent Labs Lab 01/24/14 0859 01/24/14 0945 01/25/14 0820  WBC 7.3  --  7.8  NEUTROABS 5.1  --   --   HGB 18.5* 18.7* 17.8*  HCT 50.7 55.0* 49.4  MCV 84.2  --  82.9  PLT 190  --  168   CBG:  Recent Labs Lab 01/24/14 1806 01/24/14 2153 01/25/14 0646 01/25/14 1302  GLUCAP 236* 262* 204* 209*   Hemoglobin A1C:  Recent Labs Lab 01/25/14 0456  HGBA1C 7.7*   Fasting Lipid Panel:  Recent Labs Lab 01/25/14 0456  CHOL  159  HDL 40  LDLCALC 74  TRIG 223*  CHOLHDL 4.0   Urine Drug Screen: Drugs of Abuse     Component Value Date/Time   LABOPIA NONE DETECTED 01/24/2014 1121   COCAINSCRNUR NONE DETECTED 01/24/2014 1121   LABBENZ NONE DETECTED 01/24/2014 1121   AMPHETMU NONE DETECTED 01/24/2014 1121   THCU NONE DETECTED 01/24/2014 1121   LABBARB NONE DETECTED 01/24/2014 1121    Alcohol Level:  Recent Labs Lab 01/24/14 0952  ETH <11   Urinalysis:  Recent Labs Lab 01/24/14 1121  COLORURINE YELLOW  LABSPEC 1.045*  PHURINE 5.0  GLUCOSEU >1000*  HGBUR NEGATIVE  BILIRUBINUR NEGATIVE  KETONESUR NEGATIVE  PROTEINUR NEGATIVE  UROBILINOGEN 0.2  NITRITE NEGATIVE  LEUKOCYTESUR NEGATIVE   Studies/Results: Ct Angio Head W/cm &/or Wo Cm  01/24/2014   CLINICAL DATA:  Code stroke.  Right arm weakness.  EXAM: CT ANGIOGRAPHY HEAD AND NECK  TECHNIQUE: Multidetector CT imaging of the head and neck was performed using the standard protocol during bolus administration of intravenous contrast. Multiplanar CT image reconstructions and MIPs were obtained to evaluate the vascular anatomy. Carotid stenosis measurements (when applicable) are obtained utilizing  NASCET criteria, using the distal internal carotid diameter as the denominator.  CONTRAST:  39mL OMNIPAQUE IOHEXOL 350 MG/ML SOLN  COMPARISON:  CT head 01/24/2014  FINDINGS: CTA HEAD FINDINGS  Ventricle size is normal. Negative for acute infarct. Negative for hemorrhage or mass. Normal enhancement.  Both vertebral arteries are patent to the basilar. PICA patent bilaterally. Basilar is normal. Superior cerebellar and posterior cerebral arteries are widely patent bilaterally.  Internal carotid artery widely patent bilaterally. Anterior and middle cerebral arteries widely patent.  Negative for cerebral aneurysm.  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 Negative for venous thrombosis.  Review of the MIP images confirms the above findings.  CTA NECK  FINDINGS  Aortic arch is normal. Common origin of the innominate and left carotid artery.  Carotid arteries are widely patent bilaterally. Negative for carotid stenosis or dissection.  Both vertebral arteries are widely patent without stenosis or dissection. Both vertebral arteries are patent to the basilar and are approximately equal in size.  Review of the MIP images confirms the above findings.  IMPRESSION: Normal CTA head  Normal CTA neck  Critical Value/emergent results were called by telephone at the time of interpretation on 01/24/2014 at 0928 am to Verdene Rio, [place], who verbally acknowledged these results.   Electronically Signed   By: Franchot Gallo M.D.   On: 01/24/2014 11:02   Dg Chest 2 View  01/24/2014   CLINICAL DATA:  Near syncope, dizziness.  EXAM: CHEST  2 VIEW  COMPARISON:  None.  FINDINGS: The heart size and mediastinal contours are within normal limits. Both lungs are clear. No pneumothorax or pleural effusion is noted. The visualized skeletal structures are unremarkable.  IMPRESSION: No acute cardiopulmonary abnormality seen.   Electronically Signed   By: Sabino Dick M.D.   On: 01/24/2014 09:10   Ct Head Wo Contrast  01/24/2014   CLINICAL DATA:  Code stroke, right arm weakness, near syncope  EXAM: CT HEAD WITHOUT CONTRAST  TECHNIQUE: Contiguous axial images were obtained from the base of the skull through the vertex without intravenous contrast.  COMPARISON:  None.  FINDINGS: There is no evidence of mass effect, midline shift or extra-axial fluid collections. There is no evidence of a space-occupying lesion or intracranial hemorrhage. There is no evidence of a cortical-based area of acute infarction.  The ventricles and sulci are appropriate for the patient's age. The basal cisterns are patent.  Visualized portions of the orbits are unremarkable. The visualized portions of the paranasal sinuses and mastoid air cells are unremarkable.  The osseous structures are unremarkable.   IMPRESSION: No acute intracranial pathology. These results were called by telephone at the time of interpretation on 01/24/2014 at 9:54 am to Dr. Aram Beecham, who verbally acknowledged these results.   Electronically Signed   By: Kathreen Devoid   On: 01/24/2014 09:57   Ct Angio Neck W/cm &/or Wo/cm  01/24/2014   CLINICAL DATA:  Code stroke.  Right arm weakness.  EXAM: CT ANGIOGRAPHY HEAD AND NECK  TECHNIQUE: Multidetector CT imaging of the head and neck was performed using the standard protocol during bolus administration of intravenous contrast. Multiplanar CT image reconstructions and MIPs were obtained to evaluate the vascular anatomy. Carotid stenosis measurements (when applicable) are obtained utilizing NASCET criteria, using the distal internal carotid diameter as the denominator.  CONTRAST:  72mL OMNIPAQUE IOHEXOL 350 MG/ML SOLN  COMPARISON:  CT head 01/24/2014  FINDINGS: CTA HEAD FINDINGS  Ventricle size is normal. Negative for acute infarct. Negative for hemorrhage  or mass. Normal enhancement.  Both vertebral arteries are patent to the basilar. PICA patent bilaterally. Basilar is normal. Superior cerebellar and posterior cerebral arteries are widely patent bilaterally.  Internal carotid artery widely patent bilaterally. Anterior and middle cerebral arteries widely patent.  Negative for cerebral aneurysm.  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 Negative for venous thrombosis.  Review of the MIP images confirms the above findings.  CTA NECK FINDINGS  Aortic arch is normal. Common origin of the innominate and left carotid artery.  Carotid arteries are widely patent bilaterally. Negative for carotid stenosis or dissection.  Both vertebral arteries are widely patent without stenosis or dissection. Both vertebral arteries are patent to the basilar and are approximately equal in size.  Review of the MIP images confirms the above findings.  IMPRESSION: Normal CTA head  Normal CTA neck  Critical  Value/emergent results were called by telephone at the time of interpretation on 01/24/2014 at 0928 am to Verdene Rio, [place], who verbally acknowledged these results.   Electronically Signed   By: Franchot Gallo M.D.   On: 01/24/2014 11:02   Mr Brain Wo Contrast  01/24/2014   CLINICAL DATA:  Acute onset dizziness and right arm weakness with mild headache today.  EXAM: MRI HEAD WITHOUT CONTRAST  TECHNIQUE: Multiplanar, multiecho pulse sequences of the brain and surrounding structures were obtained without intravenous contrast.  COMPARISON:  Head CT/ CTA earlier today  FINDINGS: There is no acute infarct. Ventricles and sulci are normal for age. There is no evidence of intracranial hemorrhage, mass, midline shift, or extra-axial fluid collection. No brain parenchymal signal abnormality is identified.  Orbits are unremarkable. Small left maxillary sinus mucous retention cyst is noted. Mastoid air cells are clear. Major intracranial vascular flow voids are preserved. Calvarium and scalp soft tissues are unremarkable.  IMPRESSION: Unremarkable appearance of the brain.   Electronically Signed   By: Logan Bores   On: 01/24/2014 15:43   Medications: I have reviewed the patient's current medications. Scheduled Meds: .  stroke: mapping our early stages of recovery book   Does not apply Once  . aspirin EC  81 mg Oral Daily  . heparin  5,000 Units Subcutaneous 3 times per day  . insulin aspart  0-9 Units Subcutaneous TID WC   Continuous Infusions:  PRN Meds:. Assessment/Plan: Mr. Wecker is a 49 year old male with DM2, HTN who presented with dizziness and R arm weakness likely related to dehydration in the setting of Invokana use.  Dehydration in the setting of Invokana use: He does endorse polyuria related to starting Invoka two years ago. His A1c is 7.7 indicating he could try another non-insulin therapy. Stroke workup has been largely unremarkable. -Echo pending   DM2: Continue home meds.   HTN: Continue  home meds.   #FEN:  -Diet: Carb Modified   #DVT prophylaxis: heparin 5000 units subcutaneous   #CODE STATUS: FULL CODE  Dispo: Disposition is deferred at this time, awaiting improvement of current medical problems.  Anticipated discharge in approximately 1-2 day(s).   The patient does have a current PCP (No primary provider on file.) and does not need an [facility] hospital follow-up appointment after discharge.  The patient does not have transportation limitations that hinder transportation to clinic appointments.  .Services Needed at time of discharge: Y = Yes, Blank = No PT:   OT:   RN:   Equipment:   Other:     LOS: 1 day   Charlott Rakes, MD 01/25/2014,  2:36 PM

## 2014-01-25 NOTE — Progress Notes (Signed)
Subjective: Patient resting comfortably in chair. No complaints of dizziness or pain. Objective: Vital signs in last 24 hours: Filed Vitals:   01/25/14 0000 01/25/14 0200 01/25/14 0400 01/25/14 1022  BP: 136/80  136/85 134/102  Pulse: 78 70 82 74  Temp: 98.1 F (36.7 C) 98.2 F (36.8 C) 98.2 F (36.8 C) 98.3 F (36.8 C)  TempSrc: Oral Oral Oral Oral  Resp: 16 16 16    Height:      SpO2: 98% 97% 100% 96%   Weight change:   Intake/Output Summary (Last 24 hours) at 01/25/14 1105 Last data filed at 01/24/14 2221  Gross per 24 hour  Intake      0 ml  Output   1155 ml  Net  -1155 ml   BP 134/102  Pulse 74  Temp(Src) 98.3 F (36.8 C) (Oral)  Resp 16  Ht 5\' 5"  (1.651 m)  SpO2 96%  General Appearance:    Alert, no acute distress.  Head:    Normocephalic, atraumatic.  Eyes:    PERRL, EOMI, no injection or icterus.  Ears:    Deferred.  Nose:   Deferred.  Throat:   Mucous membranes pink and well perfused.  Neck:   Supple, symmetrical, full ROM.  Back:     Deferred.  Lungs:     Clear to auscultation bilaterally, normal work of breathing.  Chest wall:    No tenderness or deformity.  Heart:    Regular rate and rhythm, S1 and S2 normal, no murmur, rub, or gallop.  Abdomen:     Soft, non-tender, bowel sounds active all four quadrants.  Genitalia:    Deferred.  Rectal:    Deferred.  Extremities:   Extremities normal, atraumatic, no cyanosis or edema.  Pulses:   2+ and symmetric upper extremities.  Skin:   Skin color, texture, turgor normal.  Lymph nodes:   Deferred.  Neurologic:   CNII-XII intact. Normal strength, sensation. No focal deficits.   Lab Results: Results for Jonathon Bray, Jonathon Bray (MRN [id number]) as of 01/25/2014 11:14  Ref. Range 01/25/2014 04:56 01/25/2014 06:46 01/25/2014 08:20  Glucose-Capillary Latest Range: 70-99 mg/dL  204 (H)   Sodium Latest Range: 137-147 mEq/L   136 (L)  Potassium Latest Range: 3.7-5.3 mEq/L   4.8  Chloride Latest Range: 96-112 mEq/L    100  CO2 Latest Range: 19-32 mEq/L   25  BUN Latest Range: 6-23 mg/dL   16  Creatinine Latest Range: 0.50-1.35 mg/dL   0.89  Calcium Latest Range: 8.4-10.5 mg/dL   9.2  GFR calc non Af Amer Latest Range: >90 mL/min   >90  GFR calc Af Amer Latest Range: >90 mL/min   >90  Glucose Latest Range: 70-99 mg/dL   253 (H)  Anion gap Latest Range: 5-15    11  Cholesterol Latest Range: 0-200 mg/dL 159    Triglycerides Latest Range: <150 mg/dL 223 (H)    HDL Latest Range: >39 mg/dL 40    LDL (calc) Latest Range: 0-99 mg/dL 74    VLDL Latest Range: 0-40 mg/dL 45 (H)    Total CHOL/HDL Ratio No range found 4.0    WBC Latest Range: 4.0-10.5 K/uL   7.8  RBC Latest Range: 4.22-5.81 MIL/uL   5.96 (H)  Hemoglobin Latest Range: 13.0-17.0 g/dL   17.8 (H)  HCT Latest Range: 39.0-52.0 %   49.4  MCV Latest Range: 78.0-100.0 fL   82.9  MCH Latest Range: 26.0-34.0 pg   29.9  MCHC Latest Range: 30.0-36.0 g/dL  36.0  RDW Latest Range: 11.5-15.5 %   12.5  Platelets Latest Range: 150-400 K/uL   168   Micro Results: No results found for this or any previous visit (from the past 240 hour(s)). Studies/Results: Ct Angio Head W/cm &/or Wo Cm  01/24/2014   CLINICAL DATA:  Code stroke.  Right arm weakness.  EXAM: CT ANGIOGRAPHY HEAD AND NECK  TECHNIQUE: Multidetector CT imaging of the head and neck was performed using the standard protocol during bolus administration of intravenous contrast. Multiplanar CT image reconstructions and MIPs were obtained to evaluate the vascular anatomy. Carotid stenosis measurements (when applicable) are obtained utilizing NASCET criteria, using the distal internal carotid diameter as the denominator.  CONTRAST:  20mL OMNIPAQUE IOHEXOL 350 MG/ML SOLN  COMPARISON:  CT head 01/24/2014  FINDINGS: CTA HEAD FINDINGS  Ventricle size is normal. Negative for acute infarct. Negative for hemorrhage or mass. Normal enhancement.  Both vertebral arteries are patent to the basilar. PICA patent bilaterally.  Basilar is normal. Superior cerebellar and posterior cerebral arteries are widely patent bilaterally.  Internal carotid artery widely patent bilaterally. Anterior and middle cerebral arteries widely patent.  Negative for cerebral aneurysm.  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 Negative for venous thrombosis.  Review of the MIP images confirms the above findings.  CTA NECK FINDINGS  Aortic arch is normal. Common origin of the innominate and left carotid artery.  Carotid arteries are widely patent bilaterally. Negative for carotid stenosis or dissection.  Both vertebral arteries are widely patent without stenosis or dissection. Both vertebral arteries are patent to the basilar and are approximately equal in size.  Review of the MIP images confirms the above findings.  IMPRESSION: Normal CTA head  Normal CTA neck  Critical Value/emergent results were called by telephone at the time of interpretation on 01/24/2014 at 0928 am to Verdene Rio, [place], who verbally acknowledged these results.   Electronically Signed   By: Franchot Gallo M.D.   On: 01/24/2014 11:02   Dg Chest 2 View  01/24/2014   CLINICAL DATA:  Near syncope, dizziness.  EXAM: CHEST  2 VIEW  COMPARISON:  None.  FINDINGS: The heart size and mediastinal contours are within normal limits. Both lungs are clear. No pneumothorax or pleural effusion is noted. The visualized skeletal structures are unremarkable.  IMPRESSION: No acute cardiopulmonary abnormality seen.   Electronically Signed   By: Sabino Dick M.D.   On: 01/24/2014 09:10   Ct Head Wo Contrast  01/24/2014   CLINICAL DATA:  Code stroke, right arm weakness, near syncope  EXAM: CT HEAD WITHOUT CONTRAST  TECHNIQUE: Contiguous axial images were obtained from the base of the skull through the vertex without intravenous contrast.  COMPARISON:  None.  FINDINGS: There is no evidence of mass effect, midline shift or extra-axial fluid collections. There is no evidence of a  space-occupying lesion or intracranial hemorrhage. There is no evidence of a cortical-based area of acute infarction.  The ventricles and sulci are appropriate for the patient's age. The basal cisterns are patent.  Visualized portions of the orbits are unremarkable. The visualized portions of the paranasal sinuses and mastoid air cells are unremarkable.  The osseous structures are unremarkable.  IMPRESSION: No acute intracranial pathology. These results were called by telephone at the time of interpretation on 01/24/2014 at 9:54 am to Dr. Aram Beecham, who verbally acknowledged these results.   Electronically Signed   By: Kathreen Devoid   On: 01/24/2014 09:57   Ct Angio Neck W/cm &/  or Wo/cm  01/24/2014   CLINICAL DATA:  Code stroke.  Right arm weakness.  EXAM: CT ANGIOGRAPHY HEAD AND NECK  TECHNIQUE: Multidetector CT imaging of the head and neck was performed using the standard protocol during bolus administration of intravenous contrast. Multiplanar CT image reconstructions and MIPs were obtained to evaluate the vascular anatomy. Carotid stenosis measurements (when applicable) are obtained utilizing NASCET criteria, using the distal internal carotid diameter as the denominator.  CONTRAST:  80mL OMNIPAQUE IOHEXOL 350 MG/ML SOLN  COMPARISON:  CT head 01/24/2014  FINDINGS: CTA HEAD FINDINGS  Ventricle size is normal. Negative for acute infarct. Negative for hemorrhage or mass. Normal enhancement.  Both vertebral arteries are patent to the basilar. PICA patent bilaterally. Basilar is normal. Superior cerebellar and posterior cerebral arteries are widely patent bilaterally.  Internal carotid artery widely patent bilaterally. Anterior and middle cerebral arteries widely patent.  Negative for cerebral aneurysm.  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 Negative for venous thrombosis.  Review of the MIP images confirms the above findings.  CTA NECK FINDINGS  Aortic arch is normal. Common origin of the  innominate and left carotid artery.  Carotid arteries are widely patent bilaterally. Negative for carotid stenosis or dissection.  Both vertebral arteries are widely patent without stenosis or dissection. Both vertebral arteries are patent to the basilar and are approximately equal in size.  Review of the MIP images confirms the above findings.  IMPRESSION: Normal CTA head  Normal CTA neck  Critical Value/emergent results were called by telephone at the time of interpretation on 01/24/2014 at 0928 am to Verdene Rio, [place], who verbally acknowledged these results.   Electronically Signed   By: Franchot Gallo M.D.   On: 01/24/2014 11:02   Mr Brain Wo Contrast  01/24/2014   CLINICAL DATA:  Acute onset dizziness and right arm weakness with mild headache today.  EXAM: MRI HEAD WITHOUT CONTRAST  TECHNIQUE: Multiplanar, multiecho pulse sequences of the brain and surrounding structures were obtained without intravenous contrast.  COMPARISON:  Head CT/ CTA earlier today  FINDINGS: There is no acute infarct. Ventricles and sulci are normal for age. There is no evidence of intracranial hemorrhage, mass, midline shift, or extra-axial fluid collection. No brain parenchymal signal abnormality is identified.  Orbits are unremarkable. Small left maxillary sinus mucous retention cyst is noted. Mastoid air cells are clear. Major intracranial vascular flow voids are preserved. Calvarium and scalp soft tissues are unremarkable.  IMPRESSION: Unremarkable appearance of the brain.   Electronically Signed   By: Logan Bores   On: 01/24/2014 15:43   Medications: I have reviewed the patient's current medications. Scheduled Meds: .  stroke: mapping our early stages of recovery book   Does not apply Once  . aspirin EC  81 mg Oral Daily  . heparin  5,000 Units Subcutaneous 3 times per day  . insulin aspart  0-9 Units Subcutaneous TID WC   Assessment/Plan: Principal Problem:   Dizziness Active Problems:   Diabetes mellitus    Hypertension  Dizziness and Right Arm Weakness: Resolved. Likely TIA v hypovolemia. CT head, CT angiogram, MRI brain, chest xray unrevealing for stroke. Elevated hemoglobin and hematocrit likely combination of dehydration and polycythemia however no history of COPD, shortness of breath, and good exercise tolerance. * Follow up carotid doppler and echocardiogram.  * D/C'd canagliflozin - polyuria perhaps contributing to dehydration. Patient to follow up with primary care physician. * Lipid panel significant for elevated triglycerides and VLDL. Patient to follow up  with primary care physician. * ASA 81mg  * Neurology following  DMII: Moderately elevated blood glucose. * D/C'd canagliflozin. * Continue home metformin.  HTN: Normotensive. * Continue home lisinopril.  #FEN: * D/C'd fluids, adequate PO intake. * Diet: carb modified  #DVT: * Heparin 5000U subq  #Code Status: Full  Dispo: Patient to be discharged today and will follow up with PCP. Hospital discharge summary to be faxed to PCP.  This is a Careers information officer Note.  The care of the patient was discussed with Dr. Daryll Drown and the assessment and plan formulated with their assistance.  Please see their attached note for official documentation of the daily encounter.   LOS: 1 day   Jerene Dilling, Med Student 01/25/2014, 11:05 AM

## 2014-01-25 NOTE — Discharge Instructions (Signed)
Thank you for trusting [place] with your medical care!  You were hospitalized for dizziness, right arm weakness, and neck muscle pain. Your studies and labwork were reassuring that you did not have a stroke but may have been due to dehydration.  Please STOP Invokana until you see your regular doctor next week. Also, please discuss the possibility of starting a statin, a medication used to lower blood cholesterol.   Dehydration, Adult Dehydration is when you lose more fluids from the body than you take in. Vital organs like the kidneys, brain, and heart cannot function without a proper amount of fluids and salt. Any loss of fluids from the body can cause dehydration.  CAUSES   Vomiting.  Diarrhea.  Excessive sweating.  Excessive urine output.  Fever. SYMPTOMS  Mild dehydration  Thirst.  Dry lips.  Slightly dry mouth. Moderate dehydration  Very dry mouth.  Sunken eyes.  Skin does not bounce back quickly when lightly pinched and released.  Dark urine and decreased urine production.  Decreased tear production.  Headache. Severe dehydration  Very dry mouth.  Extreme thirst.  Rapid, weak pulse (more than 100 beats per minute at rest).  Cold hands and feet.  Not able to sweat in spite of heat and temperature.  Rapid breathing.  Blue lips.  Confusion and lethargy.  Difficulty being awakened.  Minimal urine production.  No tears. DIAGNOSIS  Your caregiver will diagnose dehydration based on your symptoms and your exam. Blood and urine tests will help confirm the diagnosis. The diagnostic evaluation should also identify the cause of dehydration. TREATMENT  Treatment of mild or moderate dehydration can often be done at home by increasing the amount of fluids that you drink. It is best to drink small amounts of fluid more often. Drinking too much at one time can make vomiting worse. Refer to the home care instructions below. Severe dehydration needs to be treated at  the hospital where you will probably be given intravenous (IV) fluids that contain water and electrolytes. HOME CARE INSTRUCTIONS   Ask your caregiver about specific rehydration instructions.  Drink enough fluids to keep your urine clear or pale yellow.  Drink small amounts frequently if you have nausea and vomiting.  Eat as you normally do.  Avoid:  Foods or drinks high in sugar.  Carbonated drinks.  Juice.  Extremely hot or cold fluids.  Drinks with caffeine.  Fatty, greasy foods.  Alcohol.  Tobacco.  Overeating.  Gelatin desserts.  Wash your hands well to avoid spreading bacteria and viruses.  Only take over-the-counter or prescription medicines for pain, discomfort, or fever as directed by your caregiver.  Ask your caregiver if you should continue all prescribed and over-the-counter medicines.  Keep all follow-up appointments with your caregiver. SEEK MEDICAL CARE IF:  You have abdominal pain and it increases or stays in one area (localizes).  You have a rash, stiff neck, or severe headache.  You are irritable, sleepy, or difficult to awaken.  You are weak, dizzy, or extremely thirsty. SEEK IMMEDIATE MEDICAL CARE IF:   You are unable to keep fluids down or you get worse despite treatment.  You have frequent episodes of vomiting or diarrhea.  You have blood or green matter (bile) in your vomit.  You have blood in your stool or your stool looks black and tarry.  You have not urinated in 6 to 8 hours, or you have only urinated a small amount of very dark urine.  You have a fever.  You  faint. MAKE SURE YOU:   Understand these instructions.  Will watch your condition.  Will get help right away if you are not doing well or get worse. Document Released: 03/22/2005 Document Revised: 06/14/2011 Document Reviewed: 11/09/2010 [facility] Patient Information 2015 [place], [place]. This information is not intended to replace advice given to you by your  health care provider. Make sure you discuss any questions you have with your health care provider.

## 2014-01-25 NOTE — Progress Notes (Signed)
  I have seen and examined the patient myself, and I have reviewed the note by Jerene Dilling, MS 3 and was present during the interview and physical exam.  Please see my separate H&P for additional findings, assessment, and plan.   Signed: Charlott Rakes, MD 01/25/2014, 5:34 PM

## 2014-01-25 NOTE — Progress Notes (Signed)
Nutrition Brief Note  Malnutrition Screening Tool result is inaccurate.  Please consult if nutrition needs are identified.  Jelina Paulsen Barnett RD, LDN Inpatient Clinical Dietitian Pager: [phone] After Hours Pager: [phone]  

## 2014-01-31 NOTE — Discharge Summary (Signed)
I saw Jonathon Bray on day of discharge and assisted in the discharge planning.

## 2014-05-15 ENCOUNTER — Encounter: Payer: Self-pay | Admitting: Endocrinology

## 2014-05-15 ENCOUNTER — Ambulatory Visit (INDEPENDENT_AMBULATORY_CARE_PROVIDER_SITE_OTHER): Payer: Self-pay | Admitting: Endocrinology

## 2014-05-15 VITALS — BP 144/106 | HR 82 | Temp 98.0°F | Resp 16 | Ht 65.0 in | Wt 246.8 lb

## 2014-05-15 DIAGNOSIS — IMO0002 Reserved for concepts with insufficient information to code with codable children: Secondary | ICD-10-CM

## 2014-05-15 DIAGNOSIS — E1165 Type 2 diabetes mellitus with hyperglycemia: Secondary | ICD-10-CM

## 2014-05-15 DIAGNOSIS — E669 Obesity, unspecified: Secondary | ICD-10-CM

## 2014-05-15 MED ORDER — DULAGLUTIDE 0.75 MG/0.5ML ~~LOC~~ SOAJ: 0.7500 mg | SUBCUTANEOUS | Status: DC

## 2014-05-15 NOTE — Progress Notes (Signed)
Patient ID: Jonathon Bray, male   DOB: 05/16/1965, 49 y.o.   MRN: [id number]           Reason for Appointment: Consultation for Type 2 Diabetes  Referring physician: Woody Seller  History of Present Illness:          Diagnosis: Type 2 diabetes mellitus, date of diagnosis: 2006       Past history: He thinks his blood sugars were only minimally increased at the time of diagnosis and he was not put on medications right away.  His initial weight was about 220 pounds.  Subsequently was put on metformin which apparently had controlled his sugars. At some point because of higher sugars he was given Amaryl also in addition, details of subsequent control are not available. Most likely because of poor control he was given Invokana 300 mg in 2014 which he took for almost a year. He thinks his blood sugars had improved with this but had to be stopped because of an episode of near-syncope in 01/2014 caused by orthostatic hypotension and resulted in hospital admission  Recent history:  He is referred here for further management since his A1c in 2/16 was 8.2. Previously in 10/15 it was 7.4 with PCP and has not had any labs in between.  A1c appears to be regimen increasing as it was 7.2 in July 2015 He continues to be on Amaryl 4 mg in the morning along with 1500 mg of metformin a day. He was given a trial of Januvia after his hospitalization but he thinks he did this only for about a month and stopped it because of cramping in his muscles and some joint pains. He also does not think Januvia was helping his glucose. Earlier this month he was given Onglyza 5 mg to take Also on his own he has been trying to do a little better with watching his diet.  He says that he is getting did information from a family member and is able to cut back on his carbohydrates and avoiding fast food and the diet soft drinks he was using He is checking his blood sugars only in the mornings and none after meals       Oral  hypoglycemic drugs the patient is taking are: Metformin 1 g in the morning and 2.5 g p.m.  Amaryl 2 mg in a.m.      Side effects from medications have been:  orthostatic hypotension from Invokana 300 mg Compliance with the medical regimen: Good Hypoglycemia: None    Glucose monitoring:  done one time a day         Glucometer: One Touch.      Blood Glucose readings fasting 145-180; upto 250 a couple of weeks ago  Self-care: The diet that the patient has been following is: tries to limit carbs. Less eating out, less fried    Meals: 3 meals per day. Breakfast is eggs, toast and milk.  Lunch is a sandwich Has snacks with apple or fruit bars           Exercise: walking or using some exercise equipment about 3 days a week  Dietician visit, most recent: None .               Weight history:  Wt Readings from Last 3 Encounters:  05/15/14 246 lb 12.8 oz (111.948 kg)  01/25/14 235 lb (106.595 kg)  06/01/12 236 lb (107.049 kg)    Glycemic control: 8.2   Lab Results  Component Value Date  HGBA1C 7.7* 01/25/2014   HGBA1C 7.8* 01/24/2014   HGBA1C 6.7 06/01/2012   Lab Results  Component Value Date   LDLCALC 74 01/25/2014   CREATININE 0.89 01/25/2014         Medication List       This list is accurate as of: 05/15/14 11:56 AM.  Always use your most recent med list.               allopurinol 300 MG tablet  Commonly known as:  ZYLOPRIM  Take 300 mg by mouth as needed.     glimepiride 4 MG tablet  Commonly known as:  AMARYL  Take 4 mg by mouth 2 (two) times daily.     lisinopril 5 MG tablet  Commonly known as:  PRINIVIL,ZESTRIL  Take 2.5 mg by mouth every other day.     metFORMIN 500 MG tablet  Commonly known as:  GLUCOPHAGE  Take 500-1,000 mg by mouth 2 (two) times daily with a meal. 1000mg  every morning , and 500mg  in the evening with meals.        Allergies: No Known Allergies  Past Medical History  Diagnosis Date  . Diabetes mellitus without complication      type two  . Hypertension     No past surgical history on file.  Family History  Problem Relation Age of Onset  . Diabetes Mother   . Diabetes Father   . Heart disease Father     Social History:  reports that he has never smoked. He has never used smokeless tobacco. He reports that he does not drink alcohol or use illicit drugs.    Review of Systems       Vision is normal. Most recent eye exam was in 11/2013       Lipids: These have been normal except for triglycerides without any medications       Lab Results  Component Value Date   CHOL 159 01/25/2014   HDL 40 01/25/2014   LDLCALC 74 01/25/2014   TRIG 223* 01/25/2014   CHOLHDL 4.0 01/25/2014                  Skin: No rash or infections     Thyroid:  No  unusual fatigue or history of thyroid disease.     The blood pressure has been normal, he is taking low dose lisinopril for kidney protection from PCP     No swelling of feet.     No shortness of breath or chest tightness  on exertion.     Bowel habits: Normal.  May have loose stools if eating certain kinds of foods like biscuits.  No nausea or abdominal pain      No joint  pains recently and no muscle cramps recently.          No history of Numbness, tingling or burning in feet       Has nocturia about one time    No difficulty with erectile function     He is on allopurinol for history of ?  Gout  Preventive care: He does not take annual influenza vaccine and does not take 81 mg aspirin.  Physical Examination:  BP 144/106 mmHg  Pulse 82  Temp(Src) 98 F (36.7 C)  Resp 16  Ht 5\' 5"  (1.651 m)  Wt 246 lb 12.8 oz (111.948 kg)  BMI 41.07 kg/m2  SpO2 97%  Repeat blood pressure 120/82  GENERAL:  Patient has marked generalized obesity with significant abdominal component .   HEENT:         Eye exam shows normal external appearance. Fundus exam shows no retinopathy. Oral exam shows normal mucosa .  NECK:        No lymphadenopathy. Carotids are  normal to palpation and no bruit heard.  Thyroid is not enlarged and no nodules felt.   LUNGS:         Chest is symmetrical. Lungs are clear to auscultation.Marland Kitchen   HEART:         Heart sounds:  S1 and S2 are normal. No murmurs or clicks heard., no S3 or S4.   ABDOMEN:   There is no distention present. Liver and spleen are not palpable. No other mass or tenderness present.  EXTREMITIES:     There is no edema. No skin lesions present.Marland Kitchen  NEUROLOGICAL:   Vibration sense is mildly reduced in toes. Ankle jerks are absent bilaterally.   Diabetic foot exam shows normal monofilament sensation in the toes and plantar surfaces, no skin lesions or ulcers on the feet and normal pedal pulses MUSCULOSKELETAL:       There is no enlargement or deformity of the joints. Spine is normal to inspection.Marland Kitchen   SKIN:       No rash, has acanthosis of the back of the neck    ASSESSMENT:  Diabetes type 2, uncontrolled with recent A1c of 8.2%. He has had diabetes for about 10 years and most likely has had progression with  relative insulin deficiency   He may have had mild benefit from taking Invokana but his A1c in 2015 was not below 7% anyway. Apparently had orthostatic hypotension with a 300 mg dose  Although he has started losing weight on his own with better diet and some exercise he still has significant high blood sugars Most likely has postprandial hyperglycemia since he is monitoring only morning readings With A1c of 8.2 he needs a relatively efficacious medication and most likely will not be able to get control with Onglyza, A1c higher when trying Januvia.   Discussed with the patient the nature of GLP-1 drugs, the action on various organ systems, how they benefit blood glucose control, as well as the benefit of weight loss and  increase satiety . Explained possible side effects especially nausea and vomiting; discussed safety information in package insert. Demonstrated the medication injection device and injection  technique to the patient. Discussed injection sites and titration of Trulicity starting with 0.75 mg once a week for 2 weeks and then increasing to 1.5 mg if no symptoms of nausea. Patient brochure on Trulicity and co-pay card given  Complications: None evident, will check urine microalbumin on his next visit Recommended regular eye exams  PLAN:   Start Trulicity 1.82 mg as above.  Most likely will increase the dose to 1.5 mg on his next visit unless blood sugars are significantly better  Stop Onglyza  Increase metformin by 500 mg in the evenings for total dose of 2000 mg  Change Amaryl to half tablet twice a day, discussed possibility of stopping this when blood sugar is below 100  Start monitoring at least half the time about 2 hours after meals especially after her evening meal and bring monitor for download on each visit.  Consistent exercise regimen  Discussed possibility of going on insulin if blood sugars continue to be poorly controlled   Patient Instructions  Metformin 2 in am and pm  Please check  blood sugars at least half the time about 2 hours after any meal and 3 times per week on waking up. Please bring blood sugar monitor to each visit. Recommended blood sugar levels about 2 hours after meal is 140-180 and on waking up 42-353  Start TRULICITY with the pen as shown once weekly on the same day of the week.  You may inject in the stomach, thigh or arm as indicated in the brochure given.  You will feel fullness of the stomach with starting the medication and should try to keep the portions at meals small.  You may experience nausea in the first few days which usually gets better over time   If any questions or concerns are present call the office or the [place] at [phone]. Also visit [email or web address] website for more useful information  Glimeperide 1/2 twice daily and if sugar is <100 in daytime stop am dose  Stop Onglyza     Ruqayya Ventress 05/15/2014,  11:56 AM   Note: This office note was prepared with Estate agent. Any transcriptional errors that result from this process are unintentional.

## 2014-05-15 NOTE — Patient Instructions (Addendum)
Metformin 2 in am and pm  Please check blood sugars at least half the time about 2 hours after any meal and 3 times per week on waking up. Please bring blood sugar monitor to each visit. Recommended blood sugar levels about 2 hours after meal is 140-180 and on waking up 16-109  Start TRULICITY with the pen as shown once weekly on the same day of the week.  You may inject in the stomach, thigh or arm as indicated in the brochure given.  You will feel fullness of the stomach with starting the medication and should try to keep the portions at meals small.  You may experience nausea in the first few days which usually gets better over time   If any questions or concerns are present call the office or the [place] at [phone]. Also visit [email or web address] website for more useful information  Glimeperide 1/2 twice daily and if sugar is <100 in daytime stop am dose  Stop Onglyza

## 2014-05-22 ENCOUNTER — Telehealth: Payer: Self-pay | Admitting: Nutrition

## 2014-05-22 NOTE — Telephone Encounter (Signed)
Message left on his machine to call for an appt. To learn how to administer the Victoza medication.

## 2014-05-24 ENCOUNTER — Telehealth: Payer: Self-pay | Admitting: Endocrinology

## 2014-05-24 NOTE — Telephone Encounter (Signed)
The last 2 med that have been rx are not being covered by the insurance please help him with an alternate that he can use. His insurance is not giving an alternate that will be covered.

## 2014-05-24 NOTE — Telephone Encounter (Signed)
I tried calling the patient back, # was busy, will try again at a later time.

## 2014-06-06 ENCOUNTER — Other Ambulatory Visit: Payer: Self-pay | Admitting: *Deleted

## 2014-06-17 ENCOUNTER — Ambulatory Visit: Payer: Self-pay | Admitting: Endocrinology

## 2014-07-17 ENCOUNTER — Other Ambulatory Visit: Payer: Self-pay

## 2014-07-17 ENCOUNTER — Telehealth: Payer: Self-pay | Admitting: Nutrition

## 2014-07-17 MED ORDER — LIRAGLUTIDE 18 MG/3ML ~~LOC~~ SOPN: 1.2000 mg | PEN_INJECTOR | Freq: Every day | SUBCUTANEOUS | Status: DC

## 2014-07-17 NOTE — Telephone Encounter (Signed)
Phoned patient and informed him that we are changing his medication to victoza, and that he will need some instruction on how to use this pen and adjust the dosage.  He says that he has been following his diet better, and exercising daily, and that his FBSs are down to the 140s.  Explained how this medication works, and that it will help him to loose weight, and get his blood sugars down quicker after he eats, and decrease his appetitie.  Suggested he test his blood  Sugars 2hr. pcS and if over 150, he will definitely need this medication.  Michela Pitcher he would think about this.  i gave him my number to schedule a time to meet with me, should he decide to pick up this medication.  Told him also of the Co-pay card for this drug.

## 2014-07-17 NOTE — Telephone Encounter (Signed)
Trulicity is denied.   Plan requires for the following first: Bydureon, Byetta or Victoza.   Please advise.

## 2014-07-17 NOTE — Telephone Encounter (Signed)
He will be needing to start Victoza daily.  Prescription will be for 1.2 mg daily   Need him to come in to see Vaughan Basta to be trained on this device and dosage.  Also needs follow-up visit about 3 weeks after starting this medication.  He did not show for his last visit

## 2014-07-22 NOTE — Telephone Encounter (Signed)
Agree he should have instructions on the Victoza but also needs to make a follow-up appointment with me within the next 3-4 weeks

## 2014-07-22 NOTE — Telephone Encounter (Signed)
Note left on my desk from Antonieta Iba, saying the patient would like a copay card for the Victoza. Left him a message that I would leave on up from for him, but that he would need some instructions on how/when to increase the dose of this medication, and to call me before he starts on this, to set up a few min. Appt. For me to show him how to do this.

## 2014-07-23 NOTE — Telephone Encounter (Signed)
Pt has cut out all junk food and his sugar is leveled out. He is picking up the copay card tomorrow for the victoza and he will make the appt then

## 2014-07-23 NOTE — Telephone Encounter (Signed)
LM for pt to call back and schedule.

## 2014-11-16 IMAGING — CT CT ANGIO HEAD
1 of 15 series · 1 of 33 positions shown · IV contrast (Iohexol (Omnipaque 350))
Comparison: CT head 01/24/2014

CLINICAL DATA: Code stroke.  Right arm weakness.



[Series 200: locator · axial · 0.49mm/px · 1 of 1 slices shown]
[im 1/1  soft-tissue]
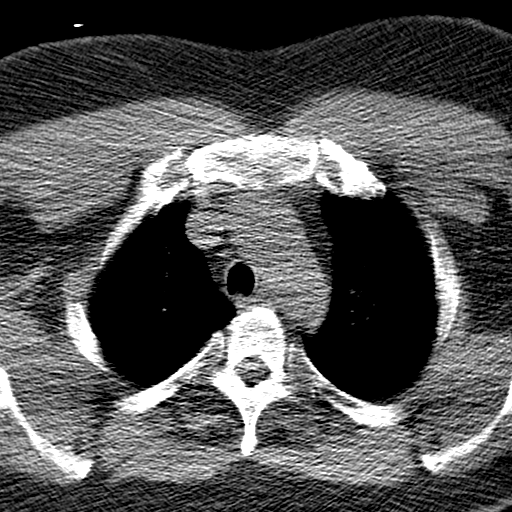

[1 of 33 positions shown; findings below may reference images not displayed]

FINDINGS: CTA HEAD FINDINGS

Ventricle size is normal. Negative for acute infarct. Negative for
hemorrhage or mass. Normal enhancement.

Both vertebral arteries are patent to the basilar. PICA patent
bilaterally. Basilar is normal. Superior cerebellar and posterior
cerebral arteries are widely patent bilaterally.

Internal carotid artery widely patent bilaterally. Anterior and
middle cerebral arteries widely patent.

Negative for cerebral aneurysm.

Hypoplastic left transverse sinus, felt to be congenital. Negative
for venous thrombosis.

Review of the MIP images confirms the above findings.

CTA NECK FINDINGS

Aortic arch is normal. Common origin of the innominate and left
carotid artery.

Carotid arteries are widely patent bilaterally. Negative for carotid
stenosis or dissection.

Both vertebral arteries are widely patent without stenosis or
dissection. Both vertebral arteries are patent to the basilar and
are approximately equal in size.

Review of the MIP images confirms the above findings.
IMPRESSION: Normal CTA head

Normal CTA neck

Critical Value/emergent results were called by telephone at the time
of interpretation on 01/24/2014 at 4438 am to Isco Sasso, PA, who
verbally acknowledged these results.

## 2018-01-02 ENCOUNTER — Ambulatory Visit: Payer: Self-pay | Admitting: "Endocrinology

## 2018-04-05 HISTORY — PX: COLONOSCOPY: SHX174

## 2020-04-02 ENCOUNTER — Encounter: Payer: Self-pay | Admitting: Internal Medicine

## 2020-04-02 ENCOUNTER — Ambulatory Visit: Payer: 59 | Admitting: Internal Medicine

## 2020-04-02 ENCOUNTER — Other Ambulatory Visit: Payer: Self-pay

## 2020-04-02 DIAGNOSIS — R911 Solitary pulmonary nodule: Secondary | ICD-10-CM

## 2020-04-02 DIAGNOSIS — I10 Essential (primary) hypertension: Secondary | ICD-10-CM

## 2020-04-02 NOTE — Assessment & Plan Note (Signed)
Never smoker - incidental dx 02/16/20  RUL x 7 mm > in reminder file for recall 08/2021   CT results reviewed with pt >>> Too small for PET or bx, not suspicious enough for excisional bx > really only option for now is follow the [employer] society guidelines as rec by radiology = 18 months until next ct due unless new resp symptoms.  Discussed in detail all the  indications, usual  risks and alternatives  relative to the benefits with patient who agrees to proceed with w/u as outlined.

## 2020-04-02 NOTE — Progress Notes (Signed)
Jonathon Bray, male    DOB: Jun 07, 1965,  MRN: [id number]   Brief patient profile:  33 yowm never smoker  Dm/hbp on acei with no chronic resp problems with acute onset fatigue august 2021 dx with covid at [facility] drug and stayed home x 10 days all better and back to work fine then fell L2 hitting [place] driveway playing with dog > CT      History of Present Illness  04/02/2020  Pulmonary/ 1st office eval/ [facility] / [place] Office  Chief Complaint  Patient presents with  . Consult    Shortness of breath sometimes when lays down  works as Catering manager well ventilated  Dyspnea:  Not limited by breathing from desired activities   Cough: none  Sleep: hs ever since fell and trying to lie flat, assoc with flare back pain  SABA use: none 02 none   No obvious day to day or daytime variability or assoc excess/ purulent sputum or mucus plugs or hemoptysis or cp or chest tightness, subjective wheeze or overt sinus or hb symptoms.   Sleeping  without nocturnal  or early am exacerbation  of respiratory  c/o's or need for noct saba. Also denies any obvious fluctuation of symptoms with weather or environmental changes or other aggravating or alleviating factors except as outlined above   No unusual exposure hx or h/o childhood pna/ asthma or knowledge of premature birth.  Current Allergies, Complete Past Medical History, Past Surgical History, Family History, and Social History were reviewed in [employer] record.  ROS  The following are not active complaints unless bolded Hoarseness, sore throat, dysphagia, dental problems, itching, sneezing,  nasal congestion or discharge of excess mucus or purulent secretions, ear ache,   fever, chills, sweats, unintended wt loss or wt gain, classically pleuritic or exertional cp,  ? Orthopnea(vs back pain related sob supine)  pnd or arm/hand swelling  or leg swelling, presyncope, palpitations, abdominal  pain, anorexia, nausea, vomiting, diarrhea  or change in bowel habits or change in bladder habits, change in stools or change in urine, dysuria, hematuria,  rash, arthralgias/back pain, visual complaints, headache, numbness, weakness or ataxia or problems with walking or coordination,  change in mood or  memory.           Past Medical History:  Diagnosis Date  . Diabetes mellitus without complication ([place])    type two  . Hypertension     Outpatient Medications Prior to Visit  Medication Sig Dispense Refill  . Dulaglutide (TRULICITY) [id number] SOPN Inject 0.75 mg into the skin once a week. 4 pen 0  . glimepiride (AMARYL) 4 MG tablet Take 2 mg by mouth 2 (two) times daily.   3  . insulin lispro (HUMALOG) 100 UNIT/ML KwikPen INJECT 18 UNITS SUBCUTANEOUSLY BEFORE MEAL(S) AND  NOT  AT  SNACKS    . Insulin NPH, Human,, Isophane, (HUMULIN N KWIKPEN) 100 UNIT/ML Kiwkpen INJECT 30 UNITS SUBCUTANEOUSLY BEFORE MORNING MEAL AND 20 UNITS AT BEDTIME    . Liraglutide 18 MG/3ML SOPN Inject 0.2 mLs (1.2 mg total) into the skin daily. 6 mL 5  . lisinopril (PRINIVIL,ZESTRIL) 5 MG tablet Take 2.5 mg by mouth every other day.    . metFORMIN (GLUCOPHAGE) 500 MG tablet Take 500-1,000 mg by mouth 2 (two) times daily with a meal. 1000mg  every morning , and 500mg  in the evening with meals.    Marland Kitchen oxyCODONE (OXY IR/ROXICODONE) 5 MG immediate release tablet Take 5  mg by mouth every 6 (six) hours as needed.    Marland Kitchen HYDROcodone-acetaminophen (NORCO/VICODIN) 5-325 MG tablet     . allopurinol (ZYLOPRIM) 300 MG tablet Take 300 mg by mouth as needed. (Patient not taking: Reported on 04/02/2020)  4   No facility-administered medications prior to visit.     Objective:     BP (!) 146/96 (BP Location: Left Arm, Cuff Size: Normal)   Pulse 99   Temp (!) 97.4 F (36.3 C) (Other (Comment)) Comment (Src): wrist  Ht 5\' 5"  (1.651 m)   Wt 222 lb 3.2 oz (100.8 kg)   SpO2 96% Comment: Room air  BMI 36.98 kg/m   SpO2: 96 %  (Room air)    HEENT : pt wearing mask not removed for exam due to covid -19 concerns.    NECK :  without JVD/Nodes/TM/ nl carotid upstrokes bilaterally   LUNGS: no acc muscle use,  Nl contour chest which is clear to A and P bilaterally without cough on insp or exp maneuvers   CV:  RRR  no s3 or murmur or increase in P2, and no edema   ABD:  soft and nontender with nl inspiratory excursion in the supine position. No bruits or organomegaly appreciated, bowel sounds nl  MS:  Nl gait/ ext warm without deformities, calf tenderness, cyanosis or clubbing No obvious joint restrictions   SKIN: warm and dry without lesions    NEURO:  alert, approp, nl sensorium with  no motor or cerebellar deficits apparent.     CT  02/16/20 s contrast  1. Right apical pulmonary nodule with a mean diameter of 7 mm.  Non-contrast chest CT at 6-12 months is recommended. If the nodule  is stable at time of repeat CT, then future CT at 18-24 months (from  today's scan) is considered optional for low-risk patients, but is  recommended for high-risk patients. This recommendation follows the  consensus statement: Guidelines for Management of Incidental  Pulmonary Nodules Detected on CT Images: From the Fleischner Society  2017; Radiology 2017; 284:228-243.  2. 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 Recommend annual imaging followup by CTA or MRA. This  recommendation follows 2010  ACCF/AHA/AATS/ACR/ASA/SCA/SCAI/SIR/STS/SVM Guidelines for the  Diagnosis and Management of Patients with Thoracic Aortic Disease.  Circulation. 2010; 1212011. Aortic aneurysm NOS (ICD10-I71.9)  3. Cholelithiasis without cholecystitis.      Assessment   Solitary pulmonary nodule on lung CT Never smoker - incidental dx 02/16/20  RUL x 7 mm > in reminder file for recall 08/2021   CT results reviewed with pt >>> Too small for PET or bx, not suspicious enough for excisional bx > really only  option for now is follow the Fleischner society guidelines as rec by radiology = 18 months until next ct due unless new resp symptoms.  Discussed in detail all the  indications, usual  risks and alternatives  relative to the benefits with patient who agrees to proceed with w/u as outlined.       Essential hypertension Not optimally controlled on present regimen. I reviewed this with the patient and emphasized importance of follow-up with primary care.  >>> advised for new cough or unexplained sob/nocturnal symptoms would prefer prefer off acei x 6 weeks prior to embarking on pulmonary w/u as trial off acei would be a reasonable first step before traveling to see me.  >>> also advised: Patient is unvaccinated and was informed of the seriousness of COVID 19 infection as  a direct risk to lung health as well as safety to close contacts and should continue to wear a facemask in public and minimize exposure to public locations but especially avoid any area or activity where non-close contacts are not observing distancing or wearing an appropriate face mask.  I strongly recommended pt take either of the vaccines available through local drugstores based on updated information on millions of Americans treated with the [place] products  which have proven both safe and  effective even against the delta and omicron variants.   Each maintenance medication was reviewed in detail including emphasizing most importantly the difference between maintenance and prns and under what circumstances the prns are to be triggered using an action plan format where appropriate.  Total time for H and P, chart review, counseling, teaching device and generating customized AVS unique to this initial office visit / charting = 45 min               Christinia Gully, MD 04/02/2020

## 2020-04-02 NOTE — Patient Instructions (Addendum)
Your nodules are most likely totally benign   Radiology recommends another scan after at least 18 months and my office will call to be sure your have it done hopefully at the same place you had the previous one for apples to apples comparison (ok Vyas's office schedule but we will to confirm)  If start coughing or having more trouble with breathing you would need to first try substituting an alternative drug for lisinopril.     I very strongly recommend you get the moderna or pfizer vaccine as soon as possible based on your risk of dying from the virus  and the proven safety and benefit of these vaccines against even the delta or omicron  variant.  This can save your life as well as  those of your loved ones,  especially if they are also not vaccinated.

## 2020-04-02 NOTE — Assessment & Plan Note (Addendum)
Not optimally controlled on present regimen. I reviewed this with the patient and emphasized importance of follow-up with primary care.  >>> advised for new cough or unexplained sob/nocturnal symptoms would prefer prefer off acei x 6 weeks prior to embarking on pulmonary w/u as trial off acei would be a reasonable first step before traveling to see me.  >>> also advised: Patient is unvaccinated and was informed of the seriousness of COVID 19 infection as a direct risk to lung health as well as safety to close contacts and should continue to wear a facemask in public and minimize exposure to public locations but especially avoid any area or activity where non-close contacts are not observing distancing or wearing an appropriate face mask.  I strongly recommended pt take either of the vaccines available through local drugstores based on updated information on millions of Americans treated with the Moderna and [employer] products  which have proven both safe and  effective even against the delta and omicron variants.   Each maintenance medication was reviewed in detail including emphasizing most importantly the difference between maintenance and prns and under what circumstances the prns are to be triggered using an action plan format where appropriate.  Total time for H and P, chart review, counseling, teaching device and generating customized AVS unique to this initial office visit / charting = 45 min

## 2020-06-17 ENCOUNTER — Other Ambulatory Visit: Payer: Self-pay | Admitting: Neurosurgery

## 2020-06-18 ENCOUNTER — Encounter (HOSPITAL_COMMUNITY): Payer: Self-pay | Admitting: Neurosurgery

## 2020-06-18 ENCOUNTER — Other Ambulatory Visit: Payer: Self-pay

## 2020-06-18 NOTE — Progress Notes (Signed)
PCP - Dr. Woody Seller Cardiologist -   Chest x-ray -  EKG - DOS Stress Test -  ECHO - 2015 Cardiac Cath -   Fasting Blood Sugar:   Checks Blood Sugar:  Has a glucose monitor but has not worn it since his MRI. But per pt he can tell when his sugars are high or too low.   Blood Thinner Instructions:  Aspirin Instructions:   COVID TEST- DOS   Anesthesia review: n/a  -------------  SDW INSTRUCTIONS:  Your procedure is scheduled on 06/19/20. Please report to [facility] Main Entrance "A" at 11:30 A.M., and check in at the Admitting office. Call this number if you have problems the morning of surgery: [phone]   Remember: Do not eat after midnight the night before your surgery  Clear liquids until 11:30 AM DOS: water, black coffee (nothing added), clear tea, non-citrus juices (no pulp- cranberry or apple juice), Gatorade   Medications to take morning of surgery with a sip of water include: insulin lispro (HUMALOG) : 3/16 no bedtime dose, 3/17: none -- 1/2 dose if CBG >220  Insulin NPH, Human,, Isophane, (HUMULIN N KWIKPEN)  3/16: 1/2 usual dose  3/17: 1/2 usual dose   ** PLEASE check your blood sugar the morning of your surgery when you wake up and every 2 hours until you get to the Short Stay unit.  If your blood sugar is less than 70 mg/dL, you will need to treat for low blood sugar: - Do not take insulin. - Treat a low blood sugar (less than 70 mg/dL) with  cup of clear juice (cranberry or apple), 4 glucose tablets, OR glucose gel. - Recheck blood sugar in 15 minutes after treatment (to make sure it is greater than 70 mg/dL). If your blood sugar is not greater than 70 mg/dL on recheck, call [phone] for further instructions.   As of today, STOP taking any Aspirin (unless otherwise instructed by your surgeon), Aleve, Naproxen, Ibuprofen, Motrin, Advil, Goody's, BC's, all herbal medications, fish oil, and all vitamins.    The Morning of Surgery Do not wear jewelry Do not  wear lotions, powders, colognes, or deodorant Men may shave face and neck. Do not bring valuables to the hospital. [facility] is not responsible for any belongings or valuables. If you are a smoker, DO NOT Smoke 24 hours prior to surgery If you wear a CPAP at night please bring your mask the morning of surgery  Remember that you must have someone to transport you home after your surgery, and remain with you for 24 hours if you are discharged the same day. Please bring cases for contacts, glasses, hearing aids, dentures or bridgework because it cannot be worn into surgery.   Patients discharged the day of surgery will not be allowed to drive home.   Please shower the NIGHT BEFORE SURGERY and the MORNING OF SURGERY with DIAL Soap. Wear comfortable clothes the morning of surgery. Oral Hygiene is also important to reduce your risk of infection.  Remember - BRUSH YOUR TEETH THE MORNING OF SURGERY WITH YOUR REGULAR TOOTHPASTE  Patient denies shortness of breath, fever, cough and chest pain.

## 2020-06-19 ENCOUNTER — Ambulatory Visit (HOSPITAL_COMMUNITY): Payer: 59 | Admitting: Anesthesiology

## 2020-06-19 ENCOUNTER — Other Ambulatory Visit: Payer: Self-pay

## 2020-06-19 ENCOUNTER — Encounter (HOSPITAL_COMMUNITY)
Admission: RE | Disposition: A | Discharge status: Home / Self Care | Payer: Self-pay | Source: Home / Self Care | Attending: Neurosurgery

## 2020-06-19 ENCOUNTER — Ambulatory Visit (HOSPITAL_COMMUNITY): Payer: 59

## 2020-06-19 ENCOUNTER — Ambulatory Visit (HOSPITAL_COMMUNITY)
Admission: RE | Admit: 2020-06-19 | Discharge: 2020-06-19 | Disposition: A | Discharge status: Home / Self Care | Payer: 59 | Attending: Neurosurgery | Admitting: Neurosurgery

## 2020-06-19 ENCOUNTER — Encounter (HOSPITAL_COMMUNITY): Payer: Self-pay | Admitting: Neurosurgery

## 2020-06-19 DIAGNOSIS — Z833 Family history of diabetes mellitus: Secondary | ICD-10-CM | POA: Insufficient documentation

## 2020-06-19 DIAGNOSIS — Z20822 Contact with and (suspected) exposure to covid-19: Secondary | ICD-10-CM | POA: Diagnosis not present

## 2020-06-19 DIAGNOSIS — W1831XA Fall on same level due to stepping on an object, initial encounter: Secondary | ICD-10-CM | POA: Diagnosis not present

## 2020-06-19 DIAGNOSIS — S32020A Wedge compression fracture of second lumbar vertebra, initial encounter for closed fracture: Secondary | ICD-10-CM | POA: Diagnosis present

## 2020-06-19 DIAGNOSIS — E119 Type 2 diabetes mellitus without complications: Secondary | ICD-10-CM | POA: Diagnosis not present

## 2020-06-19 DIAGNOSIS — Z8249 Family history of ischemic heart disease and other diseases of the circulatory system: Secondary | ICD-10-CM | POA: Diagnosis not present

## 2020-06-19 DIAGNOSIS — I10 Essential (primary) hypertension: Secondary | ICD-10-CM | POA: Insufficient documentation

## 2020-06-19 DIAGNOSIS — Z419 Encounter for procedure for purposes other than remedying health state, unspecified: Secondary | ICD-10-CM

## 2020-06-19 HISTORY — DX: Nausea with vomiting, unspecified: R11.2

## 2020-06-19 HISTORY — PX: KYPHOPLASTY: SHX5884

## 2020-06-19 HISTORY — DX: Other specified postprocedural states: Z98.890

## 2020-06-19 LAB — SARS CORONAVIRUS 2 BY RT PCR (HOSPITAL ORDER, PERFORMED IN ~~LOC~~ HOSPITAL LAB): SARS Coronavirus 2: NEGATIVE

## 2020-06-19 LAB — BASIC METABOLIC PANEL
Anion gap: 7 (ref 5–15)
BUN: 15 mg/dL (ref 6–20)
CO2: 27 mmol/L (ref 22–32)
Calcium: 9.3 mg/dL (ref 8.9–10.3)
Chloride: 99 mmol/L (ref 98–111)
Creatinine, Ser: 0.88 mg/dL (ref 0.61–1.24)
GFR, Estimated: >60 mL/min (ref >60)
Glucose, Bld: 171 mg/dL — ABNORMAL HIGH (ref 70–99)
Potassium: 4.4 mmol/L (ref 3.5–5.1)
Sodium: 133 mmol/L — ABNORMAL LOW (ref 135–145)

## 2020-06-19 LAB — CBC
HCT: 49 % (ref 39.0–52.0)
Hemoglobin: 17.5 g/dL — ABNORMAL HIGH (ref 13.0–17.0)
MCH: 29.5 pg (ref 26.0–34.0)
MCHC: 35.7 g/dL (ref 30.0–36.0)
MCV: 82.6 fL (ref 80.0–100.0)
Platelets: 233 10*3/uL (ref 150–400)
RBC: 5.93 MIL/uL — ABNORMAL HIGH (ref 4.22–5.81)
RDW: 11.9 % (ref 11.5–15.5)
WBC: 9.3 10*3/uL (ref 4.0–10.5)
nRBC: 0 % (ref 0.0–0.2)

## 2020-06-19 LAB — GLUCOSE, CAPILLARY
Glucose-Capillary: 179 mg/dL — ABNORMAL HIGH (ref 70–99)
Glucose-Capillary: 133 mg/dL — ABNORMAL HIGH (ref 70–99)

## 2020-06-19 LAB — HEMOGLOBIN A1C
Hgb A1c MFr Bld: 8.9 % — ABNORMAL HIGH (ref 4.8–5.6)
Mean Plasma Glucose: 208.73 mg/dL

## 2020-06-19 SURGERY — KYPHOPLASTY
Anesthesia: General

## 2020-06-19 MED ORDER — MIDAZOLAM HCL 5 MG/5ML IJ SOLN
INTRAMUSCULAR | Status: DC | PRN
  Administered 2020-06-19: 2 mg via INTRAVENOUS

## 2020-06-19 MED ORDER — DIPHENHYDRAMINE HCL 50 MG/ML IJ SOLN
INTRAMUSCULAR | Status: DC | PRN
  Administered 2020-06-19: 12.5 mg via INTRAVENOUS

## 2020-06-19 MED ORDER — LIDOCAINE-EPINEPHRINE 0.5 %-1:200000 IJ SOLN
INTRAMUSCULAR | Status: DC | PRN
  Administered 2020-06-19: 7 mL

## 2020-06-19 MED ORDER — ROCURONIUM BROMIDE 10 MG/ML (PF) SYRINGE
PREFILLED_SYRINGE | INTRAVENOUS | Status: DC | PRN
  Administered 2020-06-19: 50 mg via INTRAVENOUS

## 2020-06-19 MED ORDER — MEPERIDINE HCL 25 MG/ML IJ SOLN: 6.2500 mg | INTRAMUSCULAR | Status: DC | PRN

## 2020-06-19 MED ORDER — CHLORHEXIDINE GLUCONATE CLOTH 2 % EX PADS: 6.0000 | MEDICATED_PAD | Freq: Once | CUTANEOUS | Status: DC

## 2020-06-19 MED ORDER — PROPOFOL 10 MG/ML IV BOLUS
INTRAVENOUS | Status: AC
  Filled 2020-06-19: qty 20

## 2020-06-19 MED ORDER — ONDANSETRON HCL 4 MG/2ML IJ SOLN
INTRAMUSCULAR | Status: DC | PRN
  Administered 2020-06-19: 4 mg via INTRAVENOUS

## 2020-06-19 MED ORDER — LIDOCAINE-EPINEPHRINE 0.5 %-1:200000 IJ SOLN
INTRAMUSCULAR | Status: AC
  Filled 2020-06-19: qty 1

## 2020-06-19 MED ORDER — MIDAZOLAM HCL 2 MG/2ML IJ SOLN
INTRAMUSCULAR | Status: AC
  Filled 2020-06-19: qty 2

## 2020-06-19 MED ORDER — CEFAZOLIN SODIUM-DEXTROSE 2-4 GM/100ML-% IV SOLN
INTRAVENOUS | Status: AC
  Filled 2020-06-19: qty 100

## 2020-06-19 MED ORDER — FENTANYL CITRATE (PF) 250 MCG/5ML IJ SOLN
INTRAMUSCULAR | Status: AC
  Filled 2020-06-19: qty 5

## 2020-06-19 MED ORDER — CHLORHEXIDINE GLUCONATE 0.12 % MT SOLN
15.0000 mL | Freq: Once | OROMUCOSAL | Status: AC
  Administered 2020-06-19: 15 mL via OROMUCOSAL
  Filled 2020-06-19: qty 15

## 2020-06-19 MED ORDER — IOPAMIDOL (ISOVUE-300) INJECTION 61%
INTRAVENOUS | Status: DC | PRN
  Administered 2020-06-19: 50 mL

## 2020-06-19 MED ORDER — CEFAZOLIN SODIUM-DEXTROSE 2-4 GM/100ML-% IV SOLN
2.0000 g | INTRAVENOUS | Status: AC
  Administered 2020-06-19: 2 g via INTRAVENOUS

## 2020-06-19 MED ORDER — AMISULPRIDE (ANTIEMETIC) 5 MG/2ML IV SOLN: 10.0000 mg | Freq: Once | INTRAVENOUS | Status: DC | PRN

## 2020-06-19 MED ORDER — HYDROMORPHONE HCL 1 MG/ML IJ SOLN
0.2500 mg | INTRAMUSCULAR | Status: DC | PRN
Start: 2020-06-19 — End: 2020-06-19

## 2020-06-19 MED ORDER — PROPOFOL 10 MG/ML IV BOLUS
INTRAVENOUS | Status: DC | PRN
  Administered 2020-06-19: 140 mg via INTRAVENOUS

## 2020-06-19 MED ORDER — 0.9 % SODIUM CHLORIDE (POUR BTL) OPTIME
TOPICAL | Status: DC | PRN
  Administered 2020-06-19: 1000 mL

## 2020-06-19 MED ORDER — ORAL CARE MOUTH RINSE: 15.0000 mL | Freq: Once | OROMUCOSAL | Status: AC

## 2020-06-19 MED ORDER — LIDOCAINE 2% (20 MG/ML) 5 ML SYRINGE
INTRAMUSCULAR | Status: DC | PRN
  Administered 2020-06-19: 100 mg via INTRAVENOUS

## 2020-06-19 MED ORDER — DEXAMETHASONE SODIUM PHOSPHATE 10 MG/ML IJ SOLN
INTRAMUSCULAR | Status: DC | PRN
  Administered 2020-06-19: 4 mg via INTRAVENOUS

## 2020-06-19 MED ORDER — LACTATED RINGERS IV SOLN: INTRAVENOUS | Status: DC

## 2020-06-19 MED ORDER — FENTANYL CITRATE (PF) 250 MCG/5ML IJ SOLN
INTRAMUSCULAR | Status: DC | PRN
  Administered 2020-06-19: 100 ug via INTRAVENOUS
  Administered 2020-06-19: 50 ug via INTRAVENOUS

## 2020-06-19 SURGICAL SUPPLY — 37 items
BLADE CLIPPER SURG (BLADE) IMPLANT
BLADE SURG 15 STRL LF DISP TIS (BLADE) ×1 IMPLANT
BLADE SURG 15 STRL SS (BLADE) ×1
CARTRIDGE OIL MAESTRO DRILL (MISCELLANEOUS) IMPLANT
CEMENT KYPHON CX01A KIT/MIXER (Cement) IMPLANT
COVER WAND RF STERILE (DRAPES) ×2 IMPLANT
DERMABOND ADVANCED (GAUZE/BANDAGES/DRESSINGS) ×1
DERMABOND ADVANCED .7 DNX12 (GAUZE/BANDAGES/DRESSINGS) ×1 IMPLANT
DIFFUSER DRILL AIR PNEUMATIC (MISCELLANEOUS) ×2 IMPLANT
DRAPE C-ARM 42X72 X-RAY (DRAPES) ×2 IMPLANT
DRAPE HALF SHEET 40X57 (DRAPES) ×2 IMPLANT
DRAPE LAPAROTOMY 100X72X124 (DRAPES) ×2 IMPLANT
DRAPE SURG 17X23 STRL (DRAPES) ×2 IMPLANT
DRAPE WARM FLUID 44X44 (DRAPES) ×2 IMPLANT
DURAPREP 26ML APPLICATOR (WOUND CARE) ×2 IMPLANT
GAUZE 4X4 16PLY RFD (DISPOSABLE) ×2 IMPLANT
GLOVE ECLIPSE 6.5 STRL STRAW (GLOVE) ×2 IMPLANT
GLOVE EXAM NITRILE XL STR (GLOVE) IMPLANT
GOWN STRL REUS W/ TWL LRG LVL3 (GOWN DISPOSABLE) ×2 IMPLANT
GOWN STRL REUS W/ TWL XL LVL3 (GOWN DISPOSABLE) ×1 IMPLANT
GOWN STRL REUS W/TWL 2XL LVL3 (GOWN DISPOSABLE) IMPLANT
GOWN STRL REUS W/TWL LRG LVL3 (GOWN DISPOSABLE) ×2
GOWN STRL REUS W/TWL XL LVL3 (GOWN DISPOSABLE) ×1
KIT BASIN OR (CUSTOM PROCEDURE TRAY) ×2 IMPLANT
KIT TURNOVER KIT B (KITS) ×2 IMPLANT
NEEDLE HYPO 25X1 1.5 SAFETY (NEEDLE) ×2 IMPLANT
NS IRRIG 1000ML POUR BTL (IV SOLUTION) ×2 IMPLANT
OIL CARTRIDGE MAESTRO DRILL (MISCELLANEOUS)
PACK SURGICAL SETUP 50X90 (CUSTOM PROCEDURE TRAY) ×2 IMPLANT
PAD ARMBOARD 7.5X6 YLW CONV (MISCELLANEOUS) ×6 IMPLANT
SPECIMEN JAR SMALL (MISCELLANEOUS) ×2 IMPLANT
STAPLER SKIN PROX WIDE 3.9 (STAPLE) ×2 IMPLANT
SUT VIC AB 3-0 SH 8-18 (SUTURE) ×2 IMPLANT
SYR CONTROL 10ML LL (SYRINGE) ×4 IMPLANT
TOWEL GREEN STERILE (TOWEL DISPOSABLE) ×2 IMPLANT
TOWEL GREEN STERILE FF (TOWEL DISPOSABLE) ×2 IMPLANT
TRAY KYPHOPAK 20/3 ONESTEP 1ST (MISCELLANEOUS) ×2 IMPLANT

## 2020-06-19 NOTE — Progress Notes (Signed)
Called Dr Marcie Bal to see if a T&S was needed on this patient.  Per Dr Marcie Bal, no T&S needed for this procedure.  Verified t/o order.  CBC and BMP drawn and sent to lab.

## 2020-06-19 NOTE — Anesthesia Procedure Notes (Signed)
Procedure Name: Intubation Date/Time: 06/19/2020 3:02 PM Performed by: Mariea Clonts, CRNA Pre-anesthesia Checklist: Patient identified, Emergency Drugs available, Suction available and Patient being monitored Patient Re-evaluated:Patient Re-evaluated prior to induction Oxygen Delivery Method: Circle System Utilized Preoxygenation: Pre-oxygenation with 100% oxygen Induction Type: IV induction Ventilation: Mask ventilation without difficulty and Oral airway inserted - appropriate to patient size Laryngoscope Size: Sabra Heck and 2 Grade View: Grade II Tube type: Oral Tube size: 7.5 mm Number of attempts: 1 Airway Equipment and Method: Stylet and Oral airway Placement Confirmation: ETT inserted through vocal cords under direct vision,  positive ETCO2 and breath sounds checked- equal and bilateral Tube secured with: Tape Dental Injury: Teeth and Oropharynx as per pre-operative assessment

## 2020-06-19 NOTE — Anesthesia Preprocedure Evaluation (Addendum)
Anesthesia Evaluation  Patient identified by MRN, date of birth, ID band Patient awake    Reviewed: Allergy & Precautions, NPO status , Patient's Chart, lab work & pertinent test results  History of Anesthesia Complications (+) PONV and history of anesthetic complications  Airway Mallampati: II  TM Distance: >3 FB Neck ROM: Full    Dental  (+) Dental Advisory Given, Teeth Intact   Pulmonary neg pulmonary ROS,    Pulmonary exam normal breath sounds clear to auscultation       Cardiovascular hypertension, Pt. on medications  Rhythm:Regular Rate:Normal + Systolic murmurs    Neuro/Psych negative neurological ROS     GI/Hepatic negative GI ROS, Neg liver ROS,   Endo/Other  diabetes  Renal/GU negative Renal ROS     Musculoskeletal negative musculoskeletal ROS (+)   Abdominal (+) + obese,   Peds  Hematology negative hematology ROS (+)   Anesthesia Other Findings   Reproductive/Obstetrics                            Anesthesia Physical Anesthesia Plan  ASA: III  Anesthesia Plan: General   Post-op Pain Management:    Induction: Intravenous  PONV Risk Score and Plan: 4 or greater and Ondansetron, Dexamethasone, Midazolam, Treatment may vary due to age or medical condition and Diphenhydramine  Airway Management Planned: Oral ETT  Additional Equipment: None  Intra-op Plan:   Post-operative Plan: Extubation in OR  Informed Consent: I have reviewed the patients History and Physical, chart, labs and discussed the procedure including the risks, benefits and alternatives for the proposed anesthesia with the patient or authorized representative who has indicated his/her understanding and acceptance.     Dental advisory given  Plan Discussed with: CRNA  Anesthesia Plan Comments:        Anesthesia Quick Evaluation

## 2020-06-19 NOTE — Op Note (Signed)
06/19/2020  5:35 PM  PATIENT:  Jonathon Bray  55 y.o. male  PRE-OPERATIVE DIAGNOSIS:  Wedge compression fracture of second lumbar vertebra  POST-OPERATIVE DIAGNOSIS:  Wedge compression fracture of second lumbar vertebra  PROCEDURE:  Procedure(s): ATTEMPTED LUMBAR TWO KYPHOPLASTY  SURGEON: Surgeon(s): Ashok Pall, MD  ASSISTANTS:none  ANESTHESIA:   general  EBL:  Total I/O In: 100 [IV Piggyback:100] Out: -   BLOOD ADMINISTERED:none    COUNT:correct per nursing  DRAINS: none   SPECIMEN:  No Specimen  DICTATION: Jonathon Bray was taken to the operating room, intubated, and placed under a general anesthetic without difficulty. He was positioned prone on the table with all pressure points padded. His lumbar region was prepped and draped in a sterile manner. I incised the entry point and  placed needles into the L2 pedicle on both sides. I used fluoroscopic guidance to place the trocars. I inflated the balloons on both sides, and on both sides the balloon violated the superior L2 endplate. Thus I would not instill methylmethacrylate into the vertebral body.  The incisions were closed and dressed. He was turned supine and extubated. Sent to the PACU. I closed the incisions for the trocars  PLAN OF CARE: Discharge to home after PACU  PATIENT DISPOSITION:  PACU - hemodynamically stable.   Delay start of Pharmacological VTE agent (>24hrs) due to surgical blood loss or risk of bleeding:  no

## 2020-06-19 NOTE — Anesthesia Postprocedure Evaluation (Signed)
Anesthesia Post Note  Patient: Jonathon Bray  Procedure(s) Performed: ATTEMPTED LUMBAR TWO KYPHOPLASTY (N/A )     Patient location during evaluation: PACU Anesthesia Type: General Level of consciousness: sedated and patient cooperative Pain management: pain level controlled Vital Signs Assessment: post-procedure vital signs reviewed and stable Respiratory status: spontaneous breathing Cardiovascular status: stable Anesthetic complications: no   No complications documented.  Last Vitals:  Vitals:   06/19/20 1645 06/19/20 1700  BP: (!) 146/98 (!) 144/96  Pulse: 100 98  Resp: 15 15  Temp:  (!) 36.4 C  SpO2: 95% 94%    Last Pain:  Vitals:   06/19/20 1700  TempSrc:   PainSc: [name]

## 2020-06-19 NOTE — Progress Notes (Signed)
Patient states "had poison ivy" to lower legs.  Patient has sores and scars on bilateral lower legs.  CRNA Thedore Mins was informed and looked at patient's lower legs.

## 2020-06-19 NOTE — Transfer of Care (Signed)
Immediate Anesthesia Transfer of Care Note  Patient: Jonathon Bray  Procedure(s) Performed: ATTEMPTED LUMBAR TWO KYPHOPLASTY (N/A )  Patient Location: PACU  Anesthesia Type:General  Level of Consciousness: awake, alert  and oriented  Airway & Oxygen Therapy: Patient Spontanous Breathing and Patient connected to nasal cannula oxygen  Post-op Assessment: Report given to RN, Post -op Vital signs reviewed and stable and Patient moving all extremities X 4  Post vital signs: Reviewed and stable  Last Vitals:  Vitals Value Taken Time  BP 152/97 06/19/20 1628  Temp    Pulse 100 06/19/20 1631  Resp 18 06/19/20 1631  SpO2 97 % 06/19/20 1631  Vitals shown include unvalidated device data.  Last Pain:  Vitals:   06/19/20 1254  TempSrc:   PainSc: 8       Patients Stated Pain Goal: 3 (39/53/20 2334)  Complications: No complications documented.

## 2020-06-19 NOTE — H&P (Signed)
BP (!) 152/104   Pulse (!) 106   Temp 98 F (36.7 C) (Oral)   Resp 18   Ht 5\' 5"  (1.651 m)   Wt 95.3 kg   SpO2 96%   BMI 34.95 kg/m  Mr. Sperling comes in today secondary to an L2 compression fracture which he got trying to avoid a friend's dog.  He fell backwards on concrete. His fall was broken only by his back.  He also hit his head at that time.  This occurred on November 13.  He was taken to the [facility], where he was evaluated and also received a number of scans.  Head CT was normal.  The lumbar scan showed a fracture of L2.  He has been able to urinate.  He has not had a bowel movement, however, since that day.  He otherwise is doing well and did not have any significant back pain.  He has started to use a cane since the injury.  He is 55 years of age, works with heavy equipment.  He is right handed. Use alcohol. No history of substance abuse.     Past medical history is significant for hypertension and diabetes.  No previous procedures.       He has a reaction to [employer].  He saids that is knocks him out.     He takes Metformin, Lisinopril, Hydrocodone, Acetaminophen, Humalog, Humulin, and Ibuprofen.       No information about mother or father, but diabetes and heart disease present in the family history.       He says nothing truly relieves the pain, but if he stands, he gets some relief. Lying down has been his worst position.  At the time of the intake, he filled a 9/10 for pain severity.  His weight has been stable.     Review of systems positive only for the diabetes and hypertension.     On exam, Mr. Milby is alert, oriented by 4. He answers all questions appropriately.  Memory, language, attention span, and fund of knowledge are normal.  Speech is clear. It is also fluent. Hearing intact to voice.  Reflexes are intact in the upper extremities at 2+, at the knees 1+, ankles trace.  He has a negative Romberg test.  Gait is antalgic.  He does use a cane when he is walking.   Pupils equal, round, and reactive to light.  Full extraocular movements.  Full visual fields.     Mr. Bihm reports having severe pain in and around the medial left thigh.  He can barely stand anybody touching him.  He can barely stand putting his clothes on because they would have to touch it.  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  This is causing stenosis, but the compression fracture seems to have advanced slightly.  I do not know how that is connected to this hyperpathia that he seems to be describing in the left thigh.  For that, I will give him Neurontin and see how he does with it.  I would also like to just go ahead and schedule him for a kyphoplasty of L2.  There is still edema within the bone, and this fracture is now a few months old.  I think this might very well help, and we will see, but I don't have a direct correlate to the pain that he is experiencing in the thigh.  The disc herniation at 3-4 appears to  be something that has been there for quite some time.  The listhesis certainly is not acute.  I will see him for the kyphoplasty and we will get that scheduled.

## 2020-06-19 NOTE — Progress Notes (Signed)
MD at bedside explained procedure to patient. MD reported no restrictions at this time and if patient had any problems or pain medicine to call his office

## 2020-06-20 ENCOUNTER — Encounter (HOSPITAL_COMMUNITY): Payer: Self-pay | Admitting: Neurosurgery

## 2020-11-17 ENCOUNTER — Other Ambulatory Visit: Payer: Self-pay | Admitting: Neurosurgery

## 2020-12-25 NOTE — Progress Notes (Signed)
Surgical Instructions    Your procedure is scheduled on Thursday, September 29th, 2022.   Report to [facility] Main Entrance "A" at 09:00 A.M., then check in with the Admitting office.  Call this number if you have problems the morning of surgery:  [phone]   If you have any questions prior to your surgery date call [phone]: Open Monday-Friday 8am-4pm    Remember:  Do not eat after midnight the night before your surgery  You may drink clear liquids until 08:00 the morning of your surgery.   Clear liquids allowed are: Water, Non-Citrus Juices (without pulp), Carbonated Beverages, Clear Tea, Black Coffee ONLY (NO MILK, CREAM OR POWDERED CREAMER of any kind), and Gatorade    Take these medicines the morning of surgery with A SIP OF WATER: none   WHAT DO I DO ABOUT MY DIABETES MEDICATION?   Do not  take Dulaglutide (TRULICITY) and metFORMIN (GLUCOPHAGE) the morning of surgery.  THE DAY BEFORE SURGERY, take insulin lispro (HUMALOG) before meals but not at bedtime     THE MORNING OF SURGERY, do not take insulin lispro (HUMALOG)   THE NIGHT BEFORE SURGERY, take 50% of Insulin NPH, Human,, Isophane, (HUMULIN N KWIKPEN) (10 units)  THE MORNING OF SURGERY take 50% of Insulin NPH, Human,, Isophane, (HUMULIN N KWIKPEN) (9 units)  If your CBG is greater than 220 mg/dL, you may take  of your sliding scale (correction) dose of insulin.   HOW TO MANAGE YOUR DIABETES BEFORE AND AFTER SURGERY  Why is it important to control my blood sugar before and after surgery? Improving blood sugar levels before and after surgery helps healing and can limit problems. A way of improving blood sugar control is eating a healthy diet by:  Eating less sugar and carbohydrates  Increasing activity/exercise  Talking with your doctor about reaching your blood sugar goals High blood sugars (greater than 180 mg/dL) can raise your risk of infections and slow your recovery, so you will need to focus on  controlling your diabetes during the weeks before surgery. Make sure that the doctor who takes care of your diabetes knows about your planned surgery including the date and location.  How do I manage my blood sugar before surgery? Check your blood sugar at least 4 times a day, starting 2 days before surgery, to make sure that the level is not too high or low.  Check your blood sugar the morning of your surgery when you wake up and every 2 hours until you get to the Short Stay unit.  If your blood sugar is less than 70 mg/dL, you will need to treat for low blood sugar: Do not take insulin. Treat a low blood sugar (less than 70 mg/dL) with  cup of clear juice (cranberry or apple), 4 glucose tablets, OR glucose gel. Recheck blood sugar in 15 minutes after treatment (to make sure it is greater than 70 mg/dL). If your blood sugar is not greater than 70 mg/dL on recheck, call [phone] for further instructions. Report your blood sugar to the short stay nurse when you get to Short Stay.  If you are admitted to the hospital after surgery: Your blood sugar will be checked by the staff and you will probably be given insulin after surgery (instead of oral diabetes medicines) to make sure you have good blood sugar levels. The goal for blood sugar control after surgery is 80-180 mg/dL.    As of today, STOP taking any Aspirin (unless otherwise instructed by your  surgeon) Aleve, Naproxen, Ibuprofen, Motrin, Advil, Goody's, BC's, all herbal medications, fish oil, and all vitamins.           Do not wear jewelry  Do not wear lotions, powders, colognes, or deodorant. Men may shave face and neck. Do not bring valuables to the hospital.              [facility] is not responsible for any belongings or valuables.  Do NOT Smoke (Tobacco/Vaping)  24 hours prior to your procedure If you use a CPAP at night, you may bring your mask for your overnight stay.   Contacts, glasses, dentures or bridgework may  not be worn into surgery, please bring cases for these belongings   For patients admitted to the hospital, discharge time will be determined by your treatment team.   Patients discharged the day of surgery will not be allowed to drive home, and someone needs to stay with them for 24 hours.  NO VISITORS WILL BE ALLOWED IN PRE-OP WHERE PATIENTS GET READY FOR SURGERY.  ONLY 1 SUPPORT PERSON MAY BE PRESENT WHILE YOU ARE IN SURGERY.  IF YOU ARE TO BE ADMITTED, ONCE YOU ARE IN YOUR ROOM YOU WILL BE ALLOWED TWO (2) VISITORS.  Minor children may have two parents present. Special consideration for safety and communication needs will be reviewed on a case by case basis.  Special instructions:    Oral Hygiene is also important to reduce your risk of infection.  Remember - BRUSH YOUR TEETH THE MORNING OF SURGERY WITH YOUR REGULAR TOOTHPASTE   [place]- Preparing For Surgery  Before surgery, you can play an important role. Because skin is not sterile, your skin needs to be as free of germs as possible. You can reduce the number of germs on your skin by washing with CHG (chlorahexidine gluconate) Soap before surgery.  CHG is an antiseptic cleaner which kills germs and bonds with the skin to continue killing germs even after washing.     Please do not use if you have an allergy to CHG or antibacterial soaps. If your skin becomes reddened/irritated stop using the CHG.  Do not shave (including legs and underarms) for at least 48 hours prior to first CHG shower. It is OK to shave your face.  Please follow these instructions carefully.     Shower the NIGHT BEFORE SURGERY and the MORNING OF SURGERY with CHG Soap.   If you chose to wash your hair, wash your hair first as usual with your normal shampoo. After you shampoo, rinse your hair and body thoroughly to remove the shampoo.  Then [employer] and genitals (private parts) with your normal soap and rinse thoroughly to remove soap.  After that Use CHG Soap  as you would any other liquid soap. You can apply CHG directly to the skin and wash gently with a scrungie or a clean washcloth.   Apply the CHG Soap to your body ONLY FROM THE NECK DOWN.  Do not use on open wounds or open sores. Avoid contact with your eyes, ears, mouth and genitals (private parts). Wash Face and genitals (private parts)  with your normal soap.   Wash thoroughly, paying special attention to the area where your surgery will be performed.  Thoroughly rinse your body with warm water from the neck down.  DO NOT shower/wash with your normal soap after using and rinsing off the CHG Soap.  Pat yourself dry with a CLEAN TOWEL.  Wear CLEAN PAJAMAS to bed  the night before surgery  Place CLEAN SHEETS on your bed the night before your surgery  DO NOT SLEEP WITH PETS.   Day of Surgery:  Take a shower with CHG soap. Wear Clean/Comfortable clothing the morning of surgery Do not apply any deodorants/lotions.   Remember to brush your teeth WITH YOUR REGULAR TOOTHPASTE.   Please read over the following fact sheets that you were given.

## 2020-12-26 ENCOUNTER — Encounter (HOSPITAL_COMMUNITY): Payer: Self-pay

## 2020-12-26 ENCOUNTER — Other Ambulatory Visit: Payer: Self-pay

## 2020-12-26 ENCOUNTER — Encounter (HOSPITAL_COMMUNITY)
Admission: RE | Admit: 2020-12-26 | Discharge: 2020-12-26 | Disposition: A | Discharge status: Home / Self Care | Payer: 59 | Source: Ambulatory Visit | Attending: Neurosurgery | Admitting: Neurosurgery

## 2020-12-26 DIAGNOSIS — E119 Type 2 diabetes mellitus without complications: Secondary | ICD-10-CM | POA: Insufficient documentation

## 2020-12-26 DIAGNOSIS — M4316 Spondylolisthesis, lumbar region: Secondary | ICD-10-CM | POA: Diagnosis not present

## 2020-12-26 DIAGNOSIS — Z7984 Long term (current) use of oral hypoglycemic drugs: Secondary | ICD-10-CM | POA: Diagnosis not present

## 2020-12-26 DIAGNOSIS — Z7901 Long term (current) use of anticoagulants: Secondary | ICD-10-CM | POA: Insufficient documentation

## 2020-12-26 DIAGNOSIS — I10 Essential (primary) hypertension: Secondary | ICD-10-CM | POA: Insufficient documentation

## 2020-12-26 DIAGNOSIS — Z794 Long term (current) use of insulin: Secondary | ICD-10-CM | POA: Diagnosis not present

## 2020-12-26 DIAGNOSIS — Z79899 Other long term (current) drug therapy: Secondary | ICD-10-CM | POA: Insufficient documentation

## 2020-12-26 DIAGNOSIS — Z01818 Encounter for other preprocedural examination: Secondary | ICD-10-CM | POA: Diagnosis not present

## 2020-12-26 LAB — BASIC METABOLIC PANEL
Anion gap: 8 (ref 5–15)
BUN: 18 mg/dL (ref 6–20)
CO2: 28 mmol/L (ref 22–32)
Calcium: 9.4 mg/dL (ref 8.9–10.3)
Chloride: 102 mmol/L (ref 98–111)
Creatinine, Ser: 0.9 mg/dL (ref 0.61–1.24)
GFR, Estimated: >60 mL/min (ref >60)
Glucose, Bld: 158 mg/dL — ABNORMAL HIGH (ref 70–99)
Potassium: 4.3 mmol/L (ref 3.5–5.1)
Sodium: 138 mmol/L (ref 135–145)

## 2020-12-26 LAB — CBC
HCT: 45 % (ref 39.0–52.0)
Hemoglobin: 15.9 g/dL (ref 13.0–17.0)
MCH: 29.4 pg (ref 26.0–34.0)
MCHC: 35.3 g/dL (ref 30.0–36.0)
MCV: 83.2 fL (ref 80.0–100.0)
Platelets: 257 10*3/uL (ref 150–400)
RBC: 5.41 MIL/uL (ref 4.22–5.81)
RDW: 12.6 % (ref 11.5–15.5)
WBC: 11.9 10*3/uL — ABNORMAL HIGH (ref 4.0–10.5)
nRBC: 0 % (ref 0.0–0.2)

## 2020-12-26 LAB — TYPE AND SCREEN
ABO/RH(D): A NEG
Antibody Screen: NEGATIVE

## 2020-12-26 LAB — HEMOGLOBIN A1C
Hgb A1c MFr Bld: 9.3 % — ABNORMAL HIGH (ref 4.8–5.6)
Mean Plasma Glucose: 220.21 mg/dL

## 2020-12-26 LAB — SURGICAL PCR SCREEN
MRSA, PCR: NEGATIVE
Staphylococcus aureus: POSITIVE — AB

## 2020-12-26 LAB — GLUCOSE, CAPILLARY: Glucose-Capillary: 213 mg/dL — ABNORMAL HIGH (ref 70–99)

## 2020-12-26 NOTE — Progress Notes (Addendum)
PCP - Dr. Jerene Bears Cardiologist - Dr. Jacolyn Reedy  PPM/ICD - Denies  Chest x-ray - N/A EKG - 12/26/20 Stress Test - Denies ECHO - 01/27/14 Cardiac Cath - Denies  Sleep Study - Denies  Fasting Blood Sugar - 175-180 Checks Blood Sugar _2_ times a day  Patient states that he wears a continuous CBG monitor, but has not worn it in few days because he wasn't sure if he could war it for surgery. I instructed patient to continue to wear monitor as directed.  Blood Thinner Instructions: N/A Aspirin Instructions: N/A  ERAS Protcol - Yes PRE-SURGERY Ensure or G2- No  Patient had questions about what lumbar levels were being worked on during his surgery. Consent states 'Lumber 3-4'. Patient states he was told lumber 5-6 would be worked on. He will sign consent DOS after clarification.  COVID TEST- Scheduled Monday, 12/29/20   Anesthesia review: Yes, abnormal A1C  Patient denies shortness of breath, fever, cough and chest pain at PAT appointment   All instructions explained to the patient, with a verbal understanding of the material. Patient agrees to go over the instructions while at home for a better understanding. Patient also instructed to self quarantine after being tested for COVID-19. The opportunity to ask questions was provided.

## 2020-12-29 ENCOUNTER — Other Ambulatory Visit: Payer: Self-pay

## 2020-12-29 ENCOUNTER — Other Ambulatory Visit (HOSPITAL_COMMUNITY)
Admission: RE | Admit: 2020-12-29 | Discharge: 2020-12-29 | Disposition: A | Discharge status: Home / Self Care | Payer: 59 | Source: Ambulatory Visit | Attending: Neurosurgery | Admitting: Neurosurgery

## 2020-12-29 DIAGNOSIS — Z01812 Encounter for preprocedural laboratory examination: Secondary | ICD-10-CM | POA: Insufficient documentation

## 2020-12-29 DIAGNOSIS — Z20822 Contact with and (suspected) exposure to covid-19: Secondary | ICD-10-CM | POA: Insufficient documentation

## 2020-12-29 LAB — SARS CORONAVIRUS 2 (TAT 6-24 HRS): SARS Coronavirus 2: NEGATIVE

## 2020-12-29 NOTE — Progress Notes (Addendum)
Anesthesia Chart Review:  Case: [id number] Date/Time: 01/01/21 1046   Procedures:      Lumbar 3 gill procedure with Lumbar 3-4 decompression and fusion, posterior versus transforaminal (Back)     APPLICATION OF PULSE   Anesthesia type: General   Pre-op diagnosis: Spondylolisthesis, Lumbar region   Location: [facility] OR ROOM 21 / [facility]   Surgeons: Erline Levine, MD       DISCUSSION: Patient is a 55 year old male scheduled for the above procedure. S/p attempted L2 kyphoplasty 06/19/20. Per Op Note, trocars placed but inflated balloons "violated the superior L2 endplate. Thus I would not instill methylmethacrylate into the vertebral body." Incisions closed and case aborted. He had 10/21/20 L-spine MRI which showed similar L2 loss height with resolution of marrow edema and severe canal and moderate-severe foraminal stenosis at L3-4.   Other history includes never smoker, post-operative N/V, HTN, DM2. BMI is consistent with obesity.  [facility] following fall backwards onto asphalt, no LOC. Reportedly fell while trying to avoid a dog. Mild superior and inferior endplate compression fractures at L2 noted. 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 Pulmonologist Dr. Melvyn Novas recommended repeat imaging in 18 months.   A1c 9.3%, up from 7.4% in June. Based on 09/09/20 endocrinology note, Trulicity increased to 3 mg weekly, continue Humulin N 30 units Q AM and 20 unites with dinner, Humalog 18 units TID with meals; However, based on medication list in [facility], he is taking NPH 18 units in AM and 20 units in PM and sometimes taking less than 18 units Humalog with meals. Trulicity dose is still listed as 0.75 mg. Has Libre CGM but was not wearing it at PAT as she reportedly was unsure if he could wear it up to surgery. He was advised to use his CGM as directed.   I attempted to contact Mr. Lindner given elevated BP (180/107) and A1c from 12/26/20  PAT visit. I got a voice message from his cell number. I was hoping to clarify the changes in lisinopril and DM regimen based on Dr. Gustavus Bryant and endocrinology notes. He is taking less insulin then mentioned in notes, which may account for rise in A1c. Also, HTN not well controlled. Unfortunately, I don't see that BP was rechecked at PAT. Previously readings ~ 140's/90's. He is at risk for case cancellation or delay if he arrives with a significantly elevated glucose or BP on the day of surgery. I will request more recent records from [facility]. I have also left a message with Janett Billow at Dr. Melven Sartorius office regarding BP and A1c readings.   ADDENDUM 12/31/20 1:27 PM: I was able to speak with Mr. Szatkowski today. His BP of 180/107 was off antihypertensives, as he had run out for about a week and was was able to resume on 12/30/20 after getting a refill. Janett Billow with neurosurgery said BP reading there were 155/100 07/07/20, 153/90 10/13/20, and 155/96 on 11/10/20.) Also within the past three months there was about a two week time frame that he was out of insulin which likely explains the jump in his A1c.  He got those refilled about two weeks ago. He says he is compliant with his insulin, but believes his just recently picked up the increase dose of Trulicity which is why the 3 mg dose is not showing on his medication list. (I asked him to bring in a list of what he is taking,  since current doses on him medication list are lower than what is listed on his last endocrinology visit.) His back pain from his L2 fracture is improved, so no longer having SOB when lying down. He is taking lisinopril for HTN. He feels recovered from [place] from last year. Now having bilateral foot pain. 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 He has been out of work due to his foot pain since the end of August but previous was able to do  strenuous activity related to his job and property upkeep without chest pain, SOB, or syncope. He has increased foot pain with walking, so advised he use hospital wheelchair to get to [place] for surgery, in hopes to avoid pain-associated elevated BP.  He will get vitals on arrival and CBG. Anesthesia team to evaluate on the day of surgery.    VS: BP (!) 180/107   Pulse 99   Temp 36.6 C (Oral)   Resp 19   Ht 5\' 5"  (1.651 m)   Wt 100.2 kg   SpO2 98%   BMI 36.78 kg/m  BP Readings from Last 3 Encounters:  12/26/20 (!) 180/107  06/19/20 (!) 144/96  04/02/20 (!) 146/96     PROVIDERS: Glenda Chroman, MD is PCP [facility]) - Jacolyn Reedy, FNP is endocrinology provider ([facility]). At that visit, A1c 7.4%. T 3 month follow-up planned.  Christinia Gully, MD is pulmonologist. Visit on 04/02/20. Reported fatigue in August 2021 and was found to have COVID-19 by home test. Had fall 02/2020 with L2 fracture and CT showing incidental with RUL 7 mm lung nodule, too small for PET or biopsy, plan for 18 month follow-up imaging. Some SOB with lying down but otherwise activity not limitng his breathing and no cough or congestion. No edema. Question if back pain from L2 fracture aggravated by lying supine was cause. Discussed trial off ACEi and PCP follow-up for HTN. If persisted could consider further pulmonology testing.   LABS: Preoperative labs noted. A1c 9.3%, up from 8.9% on 06/19/20 and 7.4% on 09/09/20 ([facility]). See DISCUSSION/ADDENDUM. (all labs ordered are listed, but only abnormal results are displayed)  Labs Reviewed  SURGICAL PCR SCREEN - Abnormal; Notable for the following components:      Result Value   Staphylococcus aureus POSITIVE (*)    All other components within normal limits  HEMOGLOBIN A1C - Abnormal; Notable for the following components:   Hgb A1c MFr Bld 9.3 (*)    All other components within normal limits  BASIC METABOLIC PANEL - Abnormal; Notable for  the following components:   Glucose, Bld 158 (*)    All other components within normal limits  CBC - Abnormal; Notable for the following components:   WBC 11.9 (*)    All other components within normal limits  GLUCOSE, CAPILLARY - Abnormal; Notable for the following components:   Glucose-Capillary 213 (*)    All other components within normal limits  TYPE AND SCREEN     IMAGES: MRI L-spine 10/21/20 ([facility]/PACS): IMPRESSION: 1. At L3-L4, similar severe canal and moderate to severe bilateral foraminal stenosis. 2. Additional milder multifocal degenerative changes are also similar and detailed above. [See full report] 3. Similar height loss of the L2 vertebral body with resolution of the previously seen marrow edema. 4. Mild edema within the posterior right paraspinal soft tissues at L2-L3, suggestive of  a strain.   Xray left ribs and chest 3V 09/01/20 [facility]): FINDINGS:  - No rib fracture or rib lesion.  - Heart, mediastinum and hila are unremarkable. Clear lungs. No  pleural effusion or pneumothorax.   CT Chest 02/16/20 [facility]): Impression: 1. Right apical pulmonary nodule with a mean diameter of 7 mm.  Non-contrast chest CT at 6-12 months is recommended. If the nodule  is stable at time of repeat CT, then future CT at 18-24 months (from  today's scan) is considered optional for low-risk patients, but is  recommended for high-risk patients. This recommendation follows the  consensus statement: Guidelines for Management of Incidental  Pulmonary Nodules Detected on CT Images: From the Fleischner Society  2017; Radiology 2017; 284:228-243.  2. 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 Recommend annual imaging followup by CTA or MRA. This  recommendation follows 2010  ACCF/AHA/AATS/ACR/ASA/SCA/SCAI/SIR/STS/SVM Guidelines for the  Diagnosis and Management of Patients with Thoracic Aortic Disease.  Circulation. 2010; 121: Q595-G387. Aortic  aneurysm NOS (ICD10-I71.9)  3. Cholelithiasis without cholecystitis.    EKG: 12/26/20: NSR   CV: Echo 01/25/14: Study Conclusions  - Left ventricle: The cavity size was normal. Wall thickness was    normal. The estimated ejection fraction was 60%. Wall motion was    normal; there were no regional wall motion abnormalities.  - Impressions: No cardiac source of embolism was identified, but    cannot be ruled out on the basis of this examination.  Impressions:  - No cardiac source of embolism was identified, but cannot be ruled    out on the basis of this examination.   US Carotid 01/25/14: Summary:  Bilateral: 1-39% ICA stenosis. Vertebral artery flow is antegrade.    Past Medical History:  Diagnosis Date   Diabetes mellitus without complication ([place])    type two - [place] ON RIGHT UPPER OUTER ARM   Hypertension    PONV (postoperative nausea and vomiting)     Past Surgical History:  Procedure Laterality Date   COLONOSCOPY  2020   KYPHOPLASTY N/A 06/19/2020   Procedure: ATTEMPTED LUMBAR TWO KYPHOPLASTY;  Surgeon: Ashok Pall, MD;  Location: [facility];  Service: Neurosurgery;  Laterality: N/A;    MEDICATIONS:  Dulaglutide (TRULICITY) 5.64 PP/2.9JJ SOPN   insulin lispro (HUMALOG) 100 UNIT/ML KwikPen   Insulin NPH, Human,, Isophane, (HUMULIN N KWIKPEN) 100 UNIT/ML Kiwkpen   lisinopril (PRINIVIL,ZESTRIL) 5 MG tablet   metFORMIN (GLUCOPHAGE) 500 MG tablet   No current facility-administered medications for this encounter.    Myra Gianotti, PA-C Surgical Short Stay/Anesthesiology [facility] Phone [phone] [facility] Phone [phone] 12/29/2020 2:21 PM

## 2020-12-31 NOTE — Anesthesia Preprocedure Evaluation (Addendum)
Anesthesia Evaluation  Patient identified by MRN, date of birth, ID band Patient awake    Reviewed: Allergy & Precautions, Patient's Chart, lab work & pertinent test results  History of Anesthesia Complications (+) PONV and history of anesthetic complications  Airway Mallampati: II  TM Distance: >3 FB     Dental   Pulmonary neg pulmonary ROS,    breath sounds clear to auscultation       Cardiovascular hypertension, Pt. on medications  Rhythm:Regular Rate:Normal  Echo:  - Left ventricle: The cavity size was normal. Wall thickness was  normal. The estimated ejection fraction was 60%. Wall motion was  normal; there were no regional wall motion abnormalities.  - Impressions: No cardiac source of embolism was identified, but  cannot be ruled out on the basis of this examination.      Neuro/Psych negative neurological ROS  negative psych ROS   GI/Hepatic negative GI ROS, Neg liver ROS,   Endo/Other  diabetes, Type 2, Oral Hypoglycemic Agents  Renal/GU negative Renal ROS     Musculoskeletal negative musculoskeletal ROS (+)   Abdominal   Peds  Hematology negative hematology ROS (+)   Anesthesia Other Findings   Reproductive/Obstetrics                           Anesthesia Physical Anesthesia Plan  ASA: 3  Anesthesia Plan: General   Post-op Pain Management:    Induction: Intravenous  PONV Risk Score and Plan: 4 or greater and Ondansetron, Dexamethasone, Midazolam and Scopolamine patch - Pre-op  Airway Management Planned: Oral ETT  Additional Equipment: None  Intra-op Plan:   Post-operative Plan: Extubation in OR  Informed Consent: I have reviewed the patients History and Physical, chart, labs and discussed the procedure including the risks, benefits and alternatives for the proposed anesthesia with the patient or authorized representative who has indicated his/her understanding  and acceptance.       Plan Discussed with: CRNA and Anesthesiologist  Anesthesia Plan Comments: (PAT note written by Myra Gianotti, PA-C. )      Anesthesia Quick Evaluation

## 2021-01-01 ENCOUNTER — Inpatient Hospital Stay (HOSPITAL_COMMUNITY)
Admission: RE | Admit: 2021-01-01 | Discharge: 2021-01-02 | DRG: 460 | Disposition: A | Discharge status: Home / Self Care | Payer: 59 | Attending: Neurosurgery | Admitting: Neurosurgery

## 2021-01-01 ENCOUNTER — Inpatient Hospital Stay (HOSPITAL_COMMUNITY): Payer: 59 | Admitting: Vascular Surgery

## 2021-01-01 ENCOUNTER — Inpatient Hospital Stay (HOSPITAL_COMMUNITY): Payer: 59

## 2021-01-01 ENCOUNTER — Inpatient Hospital Stay (HOSPITAL_COMMUNITY): Payer: 59 | Admitting: Certified Registered Nurse Anesthetist

## 2021-01-01 ENCOUNTER — Other Ambulatory Visit: Payer: Self-pay

## 2021-01-01 ENCOUNTER — Encounter (HOSPITAL_COMMUNITY): Payer: Self-pay | Admitting: Neurosurgery

## 2021-01-01 ENCOUNTER — Encounter (HOSPITAL_COMMUNITY)
Admission: RE | Disposition: A | Discharge status: Home / Self Care | Payer: Self-pay | Source: Home / Self Care | Attending: Neurosurgery

## 2021-01-01 DIAGNOSIS — Z79891 Long term (current) use of opiate analgesic: Secondary | ICD-10-CM | POA: Diagnosis not present

## 2021-01-01 DIAGNOSIS — Z20822 Contact with and (suspected) exposure to covid-19: Secondary | ICD-10-CM | POA: Diagnosis present

## 2021-01-01 DIAGNOSIS — Z794 Long term (current) use of insulin: Secondary | ICD-10-CM | POA: Diagnosis not present

## 2021-01-01 DIAGNOSIS — I10 Essential (primary) hypertension: Secondary | ICD-10-CM | POA: Diagnosis present

## 2021-01-01 DIAGNOSIS — Z791 Long term (current) use of non-steroidal anti-inflammatories (NSAID): Secondary | ICD-10-CM

## 2021-01-01 DIAGNOSIS — Z79899 Other long term (current) drug therapy: Secondary | ICD-10-CM | POA: Diagnosis not present

## 2021-01-01 DIAGNOSIS — M5116 Intervertebral disc disorders with radiculopathy, lumbar region: Principal | ICD-10-CM | POA: Diagnosis present

## 2021-01-01 DIAGNOSIS — M4726 Other spondylosis with radiculopathy, lumbar region: Secondary | ICD-10-CM | POA: Diagnosis present

## 2021-01-01 DIAGNOSIS — Z7984 Long term (current) use of oral hypoglycemic drugs: Secondary | ICD-10-CM | POA: Diagnosis not present

## 2021-01-01 DIAGNOSIS — Z881 Allergy status to other antibiotic agents status: Secondary | ICD-10-CM | POA: Diagnosis not present

## 2021-01-01 DIAGNOSIS — Z889 Allergy status to unspecified drugs, medicaments and biological substances status: Secondary | ICD-10-CM

## 2021-01-01 DIAGNOSIS — M48061 Spinal stenosis, lumbar region without neurogenic claudication: Secondary | ICD-10-CM | POA: Diagnosis present

## 2021-01-01 DIAGNOSIS — M4306 Spondylolysis, lumbar region: Secondary | ICD-10-CM | POA: Diagnosis present

## 2021-01-01 DIAGNOSIS — Z419 Encounter for procedure for purposes other than remedying health state, unspecified: Secondary | ICD-10-CM

## 2021-01-01 LAB — GLUCOSE, CAPILLARY
Glucose-Capillary: 197 mg/dL — ABNORMAL HIGH (ref 70–99)
Glucose-Capillary: 169 mg/dL — ABNORMAL HIGH (ref 70–99)
Glucose-Capillary: 206 mg/dL — ABNORMAL HIGH (ref 70–99)
Glucose-Capillary: 330 mg/dL — ABNORMAL HIGH (ref 70–99)

## 2021-01-01 LAB — ABO/RH: ABO/RH(D): A NEG

## 2021-01-01 SURGERY — POSTERIOR LUMBAR FUSION 1 LEVEL
Anesthesia: General | Site: Spine Lumbar

## 2021-01-01 MED ORDER — INSULIN ASPART 100 UNIT/ML IJ SOLN
2.0000 [IU] | Freq: Once | INTRAMUSCULAR | Status: AC
  Administered 2021-01-01: 2 [IU] via SUBCUTANEOUS

## 2021-01-01 MED ORDER — LACTATED RINGERS IV SOLN: INTRAVENOUS | Status: DC

## 2021-01-01 MED ORDER — PHENYLEPHRINE 40 MCG/ML (10ML) SYRINGE FOR IV PUSH (FOR BLOOD PRESSURE SUPPORT)
PREFILLED_SYRINGE | INTRAVENOUS | Status: DC | PRN
  Administered 2021-01-01: 120 ug via INTRAVENOUS
  Administered 2021-01-01: 80 ug via INTRAVENOUS

## 2021-01-01 MED ORDER — FENTANYL CITRATE (PF) 250 MCG/5ML IJ SOLN
INTRAMUSCULAR | Status: DC | PRN
  Administered 2021-01-01: 100 ug via INTRAVENOUS
  Administered 2021-01-01 (×2): 50 ug via INTRAVENOUS

## 2021-01-01 MED ORDER — FENTANYL CITRATE (PF) 250 MCG/5ML IJ SOLN
INTRAMUSCULAR | Status: AC
  Filled 2021-01-01: qty 5

## 2021-01-01 MED ORDER — LABETALOL HCL 5 MG/ML IV SOLN
INTRAVENOUS | Status: AC
  Administered 2021-01-01: 5 mg via INTRAVENOUS
  Filled 2021-01-01: qty 4

## 2021-01-01 MED ORDER — BISACODYL 10 MG RE SUPP: 10.0000 mg | Freq: Every day | RECTAL | Status: DC | PRN

## 2021-01-01 MED ORDER — PANTOPRAZOLE SODIUM 40 MG PO TBEC
40.0000 mg | DELAYED_RELEASE_TABLET | Freq: Every day | ORAL | Status: DC
  Administered 2021-01-01 – 2021-01-02 (×2): 40 mg via ORAL
  Filled 2021-01-01: qty 1

## 2021-01-01 MED ORDER — LISINOPRIL 10 MG PO TABS
5.0000 mg | ORAL_TABLET | ORAL | Status: AC
  Administered 2021-01-01: 5 mg via ORAL

## 2021-01-01 MED ORDER — DEXAMETHASONE SODIUM PHOSPHATE 10 MG/ML IJ SOLN
INTRAMUSCULAR | Status: DC | PRN
  Administered 2021-01-01: 5 mg via INTRAVENOUS

## 2021-01-01 MED ORDER — METFORMIN HCL 500 MG PO TABS
1000.0000 mg | ORAL_TABLET | Freq: Two times a day (BID) | ORAL | Status: DC
  Administered 2021-01-02: 1000 mg via ORAL
  Filled 2021-01-01: qty 2

## 2021-01-01 MED ORDER — BUPIVACAINE LIPOSOME 1.3 % IJ SUSP
INTRAMUSCULAR | Status: DC | PRN
  Administered 2021-01-01: 20 mL

## 2021-01-01 MED ORDER — PHENYLEPHRINE HCL-NACL 20-0.9 MG/250ML-% IV SOLN
INTRAVENOUS | Status: DC | PRN
  Administered 2021-01-01: 30 ug/min via INTRAVENOUS

## 2021-01-01 MED ORDER — MIDAZOLAM HCL 2 MG/2ML IJ SOLN
INTRAMUSCULAR | Status: DC | PRN
  Administered 2021-01-01: 2 mg via INTRAVENOUS

## 2021-01-01 MED ORDER — CINNAMON 500 MG PO CAPS: ORAL_CAPSULE | Freq: Every day | ORAL | Status: DC

## 2021-01-01 MED ORDER — DIPHENHYDRAMINE HCL 50 MG/ML IJ SOLN
INTRAMUSCULAR | Status: AC
  Filled 2021-01-01: qty 1

## 2021-01-01 MED ORDER — HYDROCODONE-ACETAMINOPHEN 5-325 MG PO TABS
2.0000 | ORAL_TABLET | ORAL | Status: DC | PRN
  Administered 2021-01-01 – 2021-01-02 (×4): 2 via ORAL
  Filled 2021-01-01 (×4): qty 2

## 2021-01-01 MED ORDER — ROCURONIUM BROMIDE 10 MG/ML (PF) SYRINGE
PREFILLED_SYRINGE | INTRAVENOUS | Status: DC | PRN
  Administered 2021-01-01: 30 mg via INTRAVENOUS
  Administered 2021-01-01: 70 mg via INTRAVENOUS

## 2021-01-01 MED ORDER — INSULIN LISPRO (1 UNIT DIAL) 100 UNIT/ML (KWIKPEN): 18.0000 [IU] | PEN_INJECTOR | Freq: Three times a day (TID) | SUBCUTANEOUS | Status: DC

## 2021-01-01 MED ORDER — FLEET ENEMA 7-19 GM/118ML RE ENEM: 1.0000 | ENEMA | Freq: Once | RECTAL | Status: DC | PRN

## 2021-01-01 MED ORDER — LABETALOL HCL 5 MG/ML IV SOLN: 5.0000 mg | Freq: Once | INTRAVENOUS | Status: AC

## 2021-01-01 MED ORDER — CHLORHEXIDINE GLUCONATE 0.12 % MT SOLN
15.0000 mL | Freq: Once | OROMUCOSAL | Status: AC
  Administered 2021-01-01: 15 mL via OROMUCOSAL
  Filled 2021-01-01: qty 15

## 2021-01-01 MED ORDER — ONDANSETRON HCL 4 MG/2ML IJ SOLN: 4.0000 mg | Freq: Four times a day (QID) | INTRAMUSCULAR | Status: DC | PRN

## 2021-01-01 MED ORDER — DULAGLUTIDE 3 MG/0.5ML ~~LOC~~ SOAJ: 3.0000 mg | SUBCUTANEOUS | Status: DC

## 2021-01-01 MED ORDER — MEPERIDINE HCL 25 MG/ML IJ SOLN: 6.2500 mg | INTRAMUSCULAR | Status: DC | PRN

## 2021-01-01 MED ORDER — ACETAMINOPHEN 325 MG PO TABS: 325.0000 mg | ORAL_TABLET | Freq: Once | ORAL | Status: DC | PRN

## 2021-01-01 MED ORDER — ONDANSETRON HCL 4 MG PO TABS: 4.0000 mg | ORAL_TABLET | Freq: Four times a day (QID) | ORAL | Status: DC | PRN

## 2021-01-01 MED ORDER — ROCURONIUM BROMIDE 10 MG/ML (PF) SYRINGE
PREFILLED_SYRINGE | INTRAVENOUS | Status: AC
  Filled 2021-01-01: qty 10

## 2021-01-01 MED ORDER — CEFAZOLIN SODIUM-DEXTROSE 2-4 GM/100ML-% IV SOLN
2.0000 g | Freq: Three times a day (TID) | INTRAVENOUS | Status: AC
Start: 2021-01-01 — End: 2021-01-02
  Administered 2021-01-01 – 2021-01-02 (×2): 2 g via INTRAVENOUS
  Filled 2021-01-01 (×2): qty 100

## 2021-01-01 MED ORDER — SODIUM CHLORIDE 0.9% FLUSH: 3.0000 mL | Freq: Two times a day (BID) | INTRAVENOUS | Status: DC

## 2021-01-01 MED ORDER — ONDANSETRON HCL 4 MG/2ML IJ SOLN
INTRAMUSCULAR | Status: AC
  Filled 2021-01-01: qty 2

## 2021-01-01 MED ORDER — DEXAMETHASONE SODIUM PHOSPHATE 10 MG/ML IJ SOLN
INTRAMUSCULAR | Status: AC
  Filled 2021-01-01: qty 1

## 2021-01-01 MED ORDER — HYDROMORPHONE HCL 1 MG/ML IJ SOLN
INTRAMUSCULAR | Status: AC
  Filled 2021-01-01: qty 1

## 2021-01-01 MED ORDER — CEFAZOLIN SODIUM-DEXTROSE 2-4 GM/100ML-% IV SOLN
2.0000 g | INTRAVENOUS | Status: AC
  Administered 2021-01-01: 2 g via INTRAVENOUS
  Filled 2021-01-01: qty 100

## 2021-01-01 MED ORDER — METHOCARBAMOL 1000 MG/10ML IJ SOLN
500.0000 mg | Freq: Four times a day (QID) | INTRAVENOUS | Status: DC | PRN
  Filled 2021-01-01: qty 5

## 2021-01-01 MED ORDER — SODIUM CHLORIDE 0.9 % IV SOLN: 250.0000 mL | INTRAVENOUS | Status: DC

## 2021-01-01 MED ORDER — ORAL CARE MOUTH RINSE: 15.0000 mL | Freq: Once | OROMUCOSAL | Status: AC

## 2021-01-01 MED ORDER — LISINOPRIL 10 MG PO TABS
5.0000 mg | ORAL_TABLET | Freq: Every day | ORAL | Status: DC
  Administered 2021-01-02: 5 mg via ORAL
  Filled 2021-01-01 (×2): qty 1

## 2021-01-01 MED ORDER — PROPOFOL 10 MG/ML IV BOLUS
INTRAVENOUS | Status: DC | PRN
  Administered 2021-01-01: 130 mg via INTRAVENOUS

## 2021-01-01 MED ORDER — HYDROMORPHONE HCL 1 MG/ML IJ SOLN: 0.5000 mg | INTRAMUSCULAR | Status: DC | PRN

## 2021-01-01 MED ORDER — INSULIN ASPART 100 UNIT/ML IJ SOLN
INTRAMUSCULAR | Status: AC
  Filled 2021-01-01: qty 1

## 2021-01-01 MED ORDER — AMISULPRIDE (ANTIEMETIC) 5 MG/2ML IV SOLN: 10.0000 mg | Freq: Once | INTRAVENOUS | Status: DC | PRN

## 2021-01-01 MED ORDER — THROMBIN 5000 UNITS EX SOLR
CUTANEOUS | Status: AC
  Filled 2021-01-01: qty 5000

## 2021-01-01 MED ORDER — MIDAZOLAM HCL 2 MG/2ML IJ SOLN
INTRAMUSCULAR | Status: AC
  Filled 2021-01-01: qty 2

## 2021-01-01 MED ORDER — BUPIVACAINE HCL (PF) 0.5 % IJ SOLN
INTRAMUSCULAR | Status: AC
  Filled 2021-01-01: qty 30

## 2021-01-01 MED ORDER — ALUM & MAG HYDROXIDE-SIMETH 200-200-20 MG/5ML PO SUSP: 30.0000 mL | Freq: Four times a day (QID) | ORAL | Status: DC | PRN

## 2021-01-01 MED ORDER — HYDROMORPHONE HCL 1 MG/ML IJ SOLN
0.2500 mg | INTRAMUSCULAR | Status: DC | PRN
  Administered 2021-01-01 (×3): 0.5 mg via INTRAVENOUS

## 2021-01-01 MED ORDER — SODIUM CHLORIDE 0.9% FLUSH: 3.0000 mL | INTRAVENOUS | Status: DC | PRN

## 2021-01-01 MED ORDER — MENTHOL 3 MG MT LOZG: 1.0000 | LOZENGE | OROMUCOSAL | Status: DC | PRN

## 2021-01-01 MED ORDER — CHLORHEXIDINE GLUCONATE CLOTH 2 % EX PADS: 6.0000 | MEDICATED_PAD | Freq: Once | CUTANEOUS | Status: DC

## 2021-01-01 MED ORDER — OXYCODONE HCL 5 MG PO TABS: 5.0000 mg | ORAL_TABLET | ORAL | Status: DC | PRN

## 2021-01-01 MED ORDER — SCOPOLAMINE 1 MG/3DAYS TD PT72
MEDICATED_PATCH | TRANSDERMAL | Status: DC | PRN
  Administered 2021-01-01: 1 via TRANSDERMAL

## 2021-01-01 MED ORDER — LIDOCAINE-EPINEPHRINE 1 %-1:100000 IJ SOLN
INTRAMUSCULAR | Status: DC | PRN
  Administered 2021-01-01: 5 mL

## 2021-01-01 MED ORDER — KCL IN DEXTROSE-NACL 20-5-0.45 MEQ/L-%-% IV SOLN: INTRAVENOUS | Status: DC

## 2021-01-01 MED ORDER — PANTOPRAZOLE SODIUM 40 MG IV SOLR: 40.0000 mg | Freq: Every day | INTRAVENOUS | Status: DC

## 2021-01-01 MED ORDER — PHENYLEPHRINE 40 MCG/ML (10ML) SYRINGE FOR IV PUSH (FOR BLOOD PRESSURE SUPPORT)
PREFILLED_SYRINGE | INTRAVENOUS | Status: AC
  Filled 2021-01-01: qty 10

## 2021-01-01 MED ORDER — LIDOCAINE HCL (PF) 2 % IJ SOLN
INTRAMUSCULAR | Status: AC
  Filled 2021-01-01: qty 5

## 2021-01-01 MED ORDER — LIDOCAINE-EPINEPHRINE 1 %-1:100000 IJ SOLN
INTRAMUSCULAR | Status: AC
  Filled 2021-01-01: qty 1

## 2021-01-01 MED ORDER — DOCUSATE SODIUM 100 MG PO CAPS
100.0000 mg | ORAL_CAPSULE | Freq: Two times a day (BID) | ORAL | Status: DC
  Administered 2021-01-01 – 2021-01-02 (×2): 100 mg via ORAL
  Filled 2021-01-01 (×2): qty 1

## 2021-01-01 MED ORDER — ZOLPIDEM TARTRATE 5 MG PO TABS: 5.0000 mg | ORAL_TABLET | Freq: Every evening | ORAL | Status: DC | PRN

## 2021-01-01 MED ORDER — POLYETHYLENE GLYCOL 3350 17 G PO PACK: 17.0000 g | PACK | Freq: Every day | ORAL | Status: DC | PRN

## 2021-01-01 MED ORDER — METHOCARBAMOL 500 MG PO TABS
500.0000 mg | ORAL_TABLET | Freq: Four times a day (QID) | ORAL | Status: DC | PRN
  Administered 2021-01-01 – 2021-01-02 (×2): 500 mg via ORAL
  Filled 2021-01-01 (×2): qty 1

## 2021-01-01 MED ORDER — LIDOCAINE 2% (20 MG/ML) 5 ML SYRINGE
INTRAMUSCULAR | Status: DC | PRN
  Administered 2021-01-01: 60 mg via INTRAVENOUS

## 2021-01-01 MED ORDER — PROMETHAZINE HCL 25 MG/ML IJ SOLN: 6.2500 mg | INTRAMUSCULAR | Status: DC | PRN

## 2021-01-01 MED ORDER — ACETAMINOPHEN 325 MG PO TABS: 650.0000 mg | ORAL_TABLET | ORAL | Status: DC | PRN

## 2021-01-01 MED ORDER — INSULIN ASPART 100 UNIT/ML IJ SOLN
0.0000 [IU] | Freq: Three times a day (TID) | INTRAMUSCULAR | Status: DC
  Administered 2021-01-02: 8 [IU] via SUBCUTANEOUS

## 2021-01-01 MED ORDER — SUGAMMADEX SODIUM 200 MG/2ML IV SOLN
INTRAVENOUS | Status: DC | PRN
Start: 2021-01-01 — End: 2021-01-01
  Administered 2021-01-01: 200 mg via INTRAVENOUS

## 2021-01-01 MED ORDER — INSULIN ASPART 100 UNIT/ML IJ SOLN
4.0000 [IU] | Freq: Three times a day (TID) | INTRAMUSCULAR | Status: DC
  Administered 2021-01-02: 4 [IU] via SUBCUTANEOUS

## 2021-01-01 MED ORDER — BUPIVACAINE HCL (PF) 0.5 % IJ SOLN
INTRAMUSCULAR | Status: DC | PRN
  Administered 2021-01-01: 5 mL

## 2021-01-01 MED ORDER — BUPIVACAINE LIPOSOME 1.3 % IJ SUSP
INTRAMUSCULAR | Status: AC
  Filled 2021-01-01: qty 20

## 2021-01-01 MED ORDER — ONDANSETRON HCL 4 MG/2ML IJ SOLN
INTRAMUSCULAR | Status: DC | PRN
  Administered 2021-01-01: 4 mg via INTRAVENOUS

## 2021-01-01 MED ORDER — ACETAMINOPHEN 160 MG/5ML PO SOLN: 325.0000 mg | Freq: Once | ORAL | Status: DC | PRN

## 2021-01-01 MED ORDER — ACETAMINOPHEN 10 MG/ML IV SOLN: 1000.0000 mg | Freq: Once | INTRAVENOUS | Status: DC | PRN

## 2021-01-01 MED ORDER — KETOROLAC TROMETHAMINE 15 MG/ML IJ SOLN
7.5000 mg | Freq: Four times a day (QID) | INTRAMUSCULAR | Status: DC
  Administered 2021-01-01 – 2021-01-02 (×3): 7.5 mg via INTRAVENOUS
  Filled 2021-01-01 (×3): qty 1

## 2021-01-01 MED ORDER — 0.9 % SODIUM CHLORIDE (POUR BTL) OPTIME
TOPICAL | Status: DC | PRN
  Administered 2021-01-01: 1000 mL

## 2021-01-01 MED ORDER — INSULIN ASPART 100 UNIT/ML IJ SOLN
0.0000 [IU] | Freq: Every day | INTRAMUSCULAR | Status: DC
  Administered 2021-01-01: 4 [IU] via SUBCUTANEOUS

## 2021-01-01 MED ORDER — PHENOL 1.4 % MT LIQD: 1.0000 | OROMUCOSAL | Status: DC | PRN

## 2021-01-01 MED ORDER — ACETAMINOPHEN 650 MG RE SUPP: 650.0000 mg | RECTAL | Status: DC | PRN

## 2021-01-01 MED ORDER — DIPHENHYDRAMINE HCL 50 MG/ML IJ SOLN
INTRAMUSCULAR | Status: DC | PRN
  Administered 2021-01-01: 12.5 mg via INTRAVENOUS

## 2021-01-01 MED ORDER — THROMBIN 5000 UNITS EX SOLR: OROMUCOSAL | Status: DC | PRN

## 2021-01-01 SURGICAL SUPPLY — 80 items
BAG COUNTER SPONGE SURGICOUNT (BAG) ×3 IMPLANT
BASKET BONE COLLECTION (BASKET) ×3 IMPLANT
BLADE CLIPPER SURG (BLADE) ×3 IMPLANT
BONE CANC CHIPS 20CC PCAN1/4 (Bone Implant) ×3 IMPLANT
BUR MATCHSTICK NEURO 3.0 LAGG (BURR) ×3 IMPLANT
BUR PRECISION FLUTE 5.0 (BURR) ×3 IMPLANT
CANISTER SUCT 3000ML PPV (MISCELLANEOUS) ×3 IMPLANT
CARTRIDGE OIL MAESTRO DRILL (MISCELLANEOUS) ×2 IMPLANT
CHIPS CANC BONE 20CC PCAN1/4 (Bone Implant) ×2 IMPLANT
CLIP PULSE STIMULATION (NEUROSURGERY SUPPLIES) IMPLANT
CNTNR URN SCR LID CUP LEK RST (MISCELLANEOUS) ×2 IMPLANT
CONT SPEC 4OZ STRL OR WHT (MISCELLANEOUS) ×1
COVER BACK TABLE 60X90IN (DRAPES) ×3 IMPLANT
DECANTER SPIKE VIAL GLASS SM (MISCELLANEOUS) IMPLANT
DERMABOND ADVANCED (GAUZE/BANDAGES/DRESSINGS) ×1
DERMABOND ADVANCED .7 DNX12 (GAUZE/BANDAGES/DRESSINGS) ×2 IMPLANT
DIFFUSER DRILL AIR PNEUMATIC (MISCELLANEOUS) ×3 IMPLANT
DRAPE C-ARM 42X72 X-RAY (DRAPES) ×3 IMPLANT
DRAPE C-ARMOR (DRAPES) ×3 IMPLANT
DRAPE LAPAROTOMY 100X72X124 (DRAPES) ×3 IMPLANT
DRAPE PULSE TABLE ARRAY (DRAPES) ×3 IMPLANT
DRAPE SURG 17X23 STRL (DRAPES) ×3 IMPLANT
DRSG OPSITE POSTOP 4X6 (GAUZE/BANDAGES/DRESSINGS) ×3 IMPLANT
DURAPREP 26ML APPLICATOR (WOUND CARE) ×3 IMPLANT
ELECT BLADE 4.0 EZ CLEAN MEGAD (MISCELLANEOUS) ×3
ELECT REM PT RETURN 9FT ADLT (ELECTROSURGICAL) ×3
ELECTRODE BLDE 4.0 EZ CLN MEGD (MISCELLANEOUS) ×2 IMPLANT
ELECTRODE REM PT RTRN 9FT ADLT (ELECTROSURGICAL) ×2 IMPLANT
EVACUATOR 1/8 PVC DRAIN (DRAIN) IMPLANT
GAUZE 4X4 16PLY ~~LOC~~+RFID DBL (SPONGE) ×3 IMPLANT
GAUZE SPONGE 4X4 12PLY STRL (GAUZE/BANDAGES/DRESSINGS) ×3 IMPLANT
GLOVE EXAM NITRILE XL STR (GLOVE) IMPLANT
GLOVE SRG 8 PF TXTR STRL LF DI (GLOVE) ×4 IMPLANT
GLOVE SURG ENC MOIS LTX SZ8 (GLOVE) ×6 IMPLANT
GLOVE SURG LTX SZ8 (GLOVE) ×6 IMPLANT
GLOVE SURG UNDER POLY LF SZ8 (GLOVE) ×2
GLOVE SURG UNDER POLY LF SZ8.5 (GLOVE) ×6 IMPLANT
GOWN STRL REUS W/ TWL LRG LVL3 (GOWN DISPOSABLE) IMPLANT
GOWN STRL REUS W/ TWL XL LVL3 (GOWN DISPOSABLE) ×4 IMPLANT
GOWN STRL REUS W/TWL 2XL LVL3 (GOWN DISPOSABLE) ×6 IMPLANT
GOWN STRL REUS W/TWL LRG LVL3 (GOWN DISPOSABLE)
GOWN STRL REUS W/TWL XL LVL3 (GOWN DISPOSABLE) ×2
HEMOSTAT POWDER KIT SURGIFOAM (HEMOSTASIS) ×3 IMPLANT
KIT BASIN OR (CUSTOM PROCEDURE TRAY) ×3 IMPLANT
KIT INFUSE SMALL (Orthopedic Implant) ×3 IMPLANT
KIT POSITION SURG JACKSON T1 (MISCELLANEOUS) ×3 IMPLANT
KIT PULSE EMG NEEDLE (NEUROSURGERY SUPPLIES) IMPLANT
KIT PULSE EMG SURFACE (NEUROSURGERY SUPPLIES) IMPLANT
KIT PULSE MIOM NEEDLE (NEUROSURGERY SUPPLIES) IMPLANT
KIT PULSE MIOM SURFACE (NEUROSURGERY SUPPLIES) IMPLANT
KIT PULSE XLIF (NEUROSURGERY SUPPLIES) IMPLANT
KIT TURNOVER KIT B (KITS) ×3 IMPLANT
MILL MEDIUM DISP (BLADE) ×3 IMPLANT
NEEDLE HYPO 25X1 1.5 SAFETY (NEEDLE) ×3 IMPLANT
NEEDLE SPNL 18GX3.5 QUINCKE PK (NEEDLE) ×3 IMPLANT
NS IRRIG 1000ML POUR BTL (IV SOLUTION) ×3 IMPLANT
OIL CARTRIDGE MAESTRO DRILL (MISCELLANEOUS) ×3
PACK LAMINECTOMY NEURO (CUSTOM PROCEDURE TRAY) ×3 IMPLANT
PAD ARMBOARD 7.5X6 YLW CONV (MISCELLANEOUS) ×9 IMPLANT
PATTIES SURGICAL .5 X.5 (GAUZE/BANDAGES/DRESSINGS) IMPLANT
PATTIES SURGICAL .5 X1 (DISPOSABLE) IMPLANT
PATTIES SURGICAL 1X1 (DISPOSABLE) IMPLANT
PENCIL BUTTON HOLSTER BLD 10FT (ELECTRODE) ×3 IMPLANT
PROBE PULSE STIMULATION (NEUROSURGERY SUPPLIES) IMPLANT
ROD RELINE-O LORD 5.5X40 (Rod) ×6 IMPLANT
RUBBER BAND PULSE STRL (MISCELLANEOUS) ×3 IMPLANT
SCREW LOCK RELINE 5.5 TULIP (Screw) ×12 IMPLANT
SCREW RELINE-O POLY 6.5X45 (Screw) ×12 IMPLANT
SPONGE SURGIFOAM ABS GEL 100 (HEMOSTASIS) IMPLANT
SPONGE T-LAP 4X18 ~~LOC~~+RFID (SPONGE) ×3 IMPLANT
STAPLER SKIN PROX WIDE 3.9 (STAPLE) IMPLANT
SUT VIC AB 1 CT1 18XBRD ANBCTR (SUTURE) ×4 IMPLANT
SUT VIC AB 1 CT1 8-18 (SUTURE) ×2
SUT VIC AB 2-0 CT1 18 (SUTURE) ×3 IMPLANT
SUT VIC AB 3-0 SH 8-18 (SUTURE) ×3 IMPLANT
SYR 5ML LL (SYRINGE) IMPLANT
TOWEL GREEN STERILE (TOWEL DISPOSABLE) ×3 IMPLANT
TOWEL GREEN STERILE FF (TOWEL DISPOSABLE) ×3 IMPLANT
TRAY FOLEY MTR SLVR 16FR STAT (SET/KITS/TRAYS/PACK) ×3 IMPLANT
WATER STERILE IRR 1000ML POUR (IV SOLUTION) ×3 IMPLANT

## 2021-01-01 NOTE — Brief Op Note (Signed)
01/01/2021  3:56 PM  PATIENT:  Jonathon Bray  55 y.o. male  PRE-OPERATIVE DIAGNOSIS:  Spondylolisthesis, Lumbar region, spondylolysis L 3, herniated lumbar disc, lumbar spinal stenosis, radiculopathy, lumbago L 34 level  POST-OPERATIVE DIAGNOSIS:  Spondylolisthesis, Lumbar region, spondylolysis L 3, herniated lumbar disc, lumbar spinal stenosis, radiculopathy, lumbago L 34 level  PROCEDURE:  Procedure(s): Lumbar three gill procedure with Lumbar three-four decompression and fusion, posterior versus transforaminal (N/A) APPLICATION OF PULSE (N/A) pedicle screw fixation, posterolateral arthrodesis  SURGEON:  Surgeon(s) and Role:    Erline Levine, MD - Primary    * Dawley, Theodoro Doing, DO - Assisting  PHYSICIAN ASSISTANT:   ASSISTANTS: [place], RN [place] MS 3   ANESTHESIA:   general  EBL:  200 mL   BLOOD ADMINISTERED:none  DRAINS: none   LOCAL MEDICATIONS USED:  MARCAINE    and LIDOCAINE   SPECIMEN:  No Specimen  DISPOSITION OF SPECIMEN:  N/A  COUNTS:  YES  TOURNIQUET:  * No tourniquets in log *  DICTATION: Patient is 55 year old man with mobile spondylolisthesis of L 3 on L 4 with bilateral L 3 spondylolysis, herniated disc and lumbar stenosis. He has a severe left greater than right radiculopathy. It was elected to take him to surgery for decompression and fusion at this level.   Procedure: Patient was placed in a prone position on the [place] table after smooth and uncomplicated induction of general endotracheal anesthesia. His low back was prepped and draped in usual sterile fashion with betadine scrub and DuraPrep after localizing radiographs were obtained with Pulse. Area of incision was infiltrated with local lidocaine. Incision was made to the lumbodorsal fascia was incised and exposure was performed of the L 3 through L 4 spinous processes laminae facet joint and transverse processes. Intraoperative x-ray was obtained which confirmed correct orientation. A total  laminectomy of L 3 (Gill procedure)  was performed with disarticulation of the facet joints at this level and thorough decompression was performed of both L 3 and L 4 nerve roots along with the common dural tube. The bilateral pars defects were identified and posterior elements were removed.  This decompression was more involved than would be typical of that performed for PLIF alone and included painstaking dissection of adherent ligament compressing the thecal sac and wide decompression of all neural elements. Both the L 3 and L 4 nerve roots were widely decompressed.  The bone quality was extremely soft.  I felt that the patient would likely subside with interbody grafts, so we elected to not perform this portion of the procedure.  The posterolateral region was extensively decorticated and pedicle probes were placed at L 3 and L 4 bilaterally. Intraoperative fluoroscopy confirmed correct orientationin the AP and lateral plane. 45 x 6.5 mm pedicle screws were placed at L 4 bilaterally and 45 x 6.5 mm screws placed at L 3 bilaterally final x-rays demonstrated well-positioned pedicle screw fixation. A 40 mm lordotic rod was placed on the right and a 40 mm rod was placed on the left locked down in situ and the posterolateral region was packed with the small BMP and 40 cc bone autograft supplemented with 20 cc allograft chips. The wound was irrigated. Long-acting Marcaine was injected into the deep musculature. Fascia was closed with 1 Vicryl sutures skin edges were reapproximated 2 and 3-0 Vicryl sutures. The wound was dressed with Dermabond and an occlusive dressing.  The patient was extubated in the operating room and taken to recovery in stable satisfactory condition.  He tolerated the operation well counts were correct at the end of the case.   PLAN OF CARE: Admit to inpatient   PATIENT DISPOSITION:  PACU - hemodynamically stable.   Delay start of Pharmacological VTE agent (>24hrs) due to surgical blood loss  or risk of bleeding: yes

## 2021-01-01 NOTE — Anesthesia Postprocedure Evaluation (Signed)
Anesthesia Post Note  Patient: Diamante Rubin  Procedure(s) Performed: Lumbar three gill procedure with Lumbar three-four decompression and fusion, posterior versus transforaminal (Spine Lumbar) APPLICATION OF PULSE     Patient location during evaluation: PACU Anesthesia Type: General Level of consciousness: awake and alert Pain management: pain level controlled Vital Signs Assessment: post-procedure vital signs reviewed and stable Respiratory status: spontaneous breathing, nonlabored ventilation, respiratory function stable and patient connected to nasal cannula oxygen Cardiovascular status: blood pressure returned to baseline and stable Postop Assessment: no apparent nausea or vomiting Anesthetic complications: no   No notable events documented.  Last Vitals:  Vitals:   01/01/21 1700 01/01/21 1800  BP: (!) 156/94   Pulse: 88 92  Resp: (!) 9 12  Temp:    SpO2: 98% 97%    Last Pain:  Vitals:   01/01/21 1800  TempSrc:   PainSc: Mango Yesenia Fontenette

## 2021-01-01 NOTE — Op Note (Signed)
01/01/2021  3:56 PM  PATIENT:  Jonathon Bray  55 y.o. male  PRE-OPERATIVE DIAGNOSIS:  Spondylolisthesis, Lumbar region, spondylolysis L 3, herniated lumbar disc, lumbar spinal stenosis, radiculopathy, lumbago L 34 level  POST-OPERATIVE DIAGNOSIS:  Spondylolisthesis, Lumbar region, spondylolysis L 3, herniated lumbar disc, lumbar spinal stenosis, radiculopathy, lumbago L 34 level  PROCEDURE:  Procedure(s): Lumbar three gill procedure with Lumbar three-four decompression and fusion, posterior versus transforaminal (N/A) APPLICATION OF PULSE (N/A) pedicle screw fixation, posterolateral arthrodesis  SURGEON:  Surgeon(s) and Role:    Erline Levine, MD - Primary    * Dawley, Theodoro Doing, DO - Assisting  PHYSICIAN ASSISTANT:   ASSISTANTS: [place], RN [place] MS 3   ANESTHESIA:   general  EBL:  200 mL   BLOOD ADMINISTERED:none  DRAINS: none   LOCAL MEDICATIONS USED:  MARCAINE    and LIDOCAINE   SPECIMEN:  No Specimen  DISPOSITION OF SPECIMEN:  N/A  COUNTS:  YES  TOURNIQUET:  * No tourniquets in log *  DICTATION: Patient is 55 year old man with mobile spondylolisthesis of L 3 on L 4 with bilateral L 3 spondylolysis, herniated disc and lumbar stenosis. He has a severe left greater than right radiculopathy. It was elected to take him to surgery for decompression and fusion at this level.   Procedure: Patient was placed in a prone position on the [place] table after smooth and uncomplicated induction of general endotracheal anesthesia. His low back was prepped and draped in usual sterile fashion with betadine scrub and DuraPrep after localizing radiographs were obtained with Pulse. Area of incision was infiltrated with local lidocaine. Incision was made to the lumbodorsal fascia was incised and exposure was performed of the L 3 through L 4 spinous processes laminae facet joint and transverse processes. Intraoperative x-ray was obtained which confirmed correct orientation. A total  laminectomy of L 3 (Gill procedure)  was performed with disarticulation of the facet joints at this level and thorough decompression was performed of both L 3 and L 4 nerve roots along with the common dural tube. The bilateral pars defects were identified and posterior elements were removed.  This decompression was more involved than would be typical of that performed for PLIF alone and included painstaking dissection of adherent ligament compressing the thecal sac and wide decompression of all neural elements. Both the L 3 and L 4 nerve roots were widely decompressed.  The bone quality was extremely soft.  I felt that the patient would likely subside with interbody grafts, so we elected to not perform this portion of the procedure.  The posterolateral region was extensively decorticated and pedicle probes were placed at L 3 and L 4 bilaterally. Intraoperative fluoroscopy confirmed correct orientationin the AP and lateral plane. 45 x 6.5 mm pedicle screws were placed at L 4 bilaterally and 45 x 6.5 mm screws placed at L 3 bilaterally final x-rays demonstrated well-positioned pedicle screw fixation. A 40 mm lordotic rod was placed on the right and a 40 mm rod was placed on the left locked down in situ and the posterolateral region was packed with the small BMP and 40 cc bone autograft supplemented with 20 cc allograft chips. The wound was irrigated. Long-acting Marcaine was injected into the deep musculature. Fascia was closed with 1 Vicryl sutures skin edges were reapproximated 2 and 3-0 Vicryl sutures. The wound was dressed with Dermabond and an occlusive dressing.  The patient was extubated in the operating room and taken to recovery in stable satisfactory condition.  He tolerated the operation well counts were correct at the end of the case.   PLAN OF CARE: Admit to inpatient   PATIENT DISPOSITION:  PACU - hemodynamically stable.   Delay start of Pharmacological VTE agent (>24hrs) due to surgical blood loss  or risk of bleeding: yes

## 2021-01-01 NOTE — H&P (Signed)
Patient ID:   [id number] Patient: Jonathon Bray  Date of Birth: March 29, 1966 Visit Type: Office Visit   Date: 11/10/2020 10:30 AM Provider: Marchia Meiers. Vertell Limber MD   This 55 year old male presents for back pain.  HISTORY OF PRESENT ILLNESS: 1.  back pain  The patient remains severely symptomatic.  He continues to have severe pain.  He is having pain into both of his legs.  New MRI imaging redemonstrates severe spinal stenosis at the L3-4 level with bilateral pars defects of L3 and with spondylolisthesis of L3 on L4.  The previously identified L2 compression fracture appears sclerotic and there does not appear to be any evidence of continued bone marrow edema suggesting the fracture is healed.  Based on his new imaging and his persistent pain and weakness, I have recommended proceeding with surgery.  This will consist of L3 Gill procedure with L3-4 decompression and fusion.  The patient wishes to go ahead with surgery.      Medical/Surgical/Interim History Reviewed, no change.  Last detailed document date:02/19/2020.     PAST MEDICAL HISTORY, SURGICAL HISTORY, FAMILY HISTORY, SOCIAL HISTORY AND REVIEW OF SYSTEMS I have reviewed the patient's past medical, surgical, family and social history as well as the comprehensive review of systems as included on the [facility] history form dated 02/19/2020, which I have signed.  Family History: Reviewed, no changes.  Last detailed document date:02/19/2020.   Social History: Reviewed, no changes. Last detailed document date: 02/19/2020.    MEDICATIONS: (added, continued or stopped this visit) Started Medication Directions Instruction Stopped 05/08/2020 acetaminophen 300 mg-codeine 30 mg tablet take 1 tablet by oral route  every 6 hours as needed   05/08/2020 cyclobenzaprine 10 mg tablet take 1 tablet by oral route 3 times every day as needed   05/26/2020 gabapentin 300 mg capsule take 1 capsule by  oral route once today, then 2 tablets tomorrow, then 3 tablets each day afterwards    HUMALOG inject by subcutaneous route per prescriber's instructions. Insulin dosing requires individualization.    lisinopril take 1 tablet by oral route  every day    metformin HCl take 1 tablet by oral route 2 times every day with morning and evening meals   10/13/2020 methocarbamol 500 mg tablet take 1 tablet by oral route 4 times every day  11/14/2020 10/13/2020 Mobic 15 mg tablet take 1 tablet by oral route  every day   06/29/2020 oxycodone 5 mg tablet take 1 tablet by oral route  every  6 hours as needed      ALLERGIES: Ingredient Reaction Medication Name Comment NO KNOWN ALLERGIES    No known allergies. Reviewed, no changes.    PHYSICAL EXAM:  Vitals Date Temp F BP Pulse Ht In Wt Lb BMI BSA Pain Score 11/10/2020  155/96 93 65 221 36.78  8/10     IMPRESSION:  The patient has severe back and bilateral lower extremity pain refractory to conservative management.  His previous L2 compression fracture has healed.  He continues to have severe spinal stenosis with spondylolisthesis at L3-4 with bilateral L3 pars defects.  PLAN: L3 Gill with L3-4 fusion.  Risks and benefits were discussed in detail with the patient he wishes to proceed with surgery.  Patient education was performed extensively today.  Orders: Diagnostic Procedures: Assessment Procedure M54.16 Lumbar Spine- AP/Lat Instruction(s)/Education: Assessment Instruction I10 Lifestyle education Z68.36 Lifestyle education regarding diet  Completed Orders (this encounter) Order Details Reason Side Interpretation Result Initial Treatment Date Region  Lifestyle education Patient will follow up with primary care physician.       Lifestyle education regarding diet Encouraged patient to eat well balanced diet        Assessment/Plan  # Detail Type Description   1. Assessment Lumbar spondylosis (M47.816).     2. Assessment Spondylolisthesis, lumbar region (M43.16).     3. Assessment Lumbar radiculopathy (M54.16).     4. Assessment Lumbar foraminal stenosis (M48.061).     5. Assessment Essential (primary) hypertension (I10).     6. Assessment Body mass index (BMI) 36.0-36.9, adult (Z68.36).  Plan Orders Today's instructions / counseling include(s) Lifestyle education regarding diet. Clinical information/comments: Encouraged patient to eat well balanced diet.       Pain Management Plan Pain Scale: 8/10. Method: Numeric Pain Intensity Scale. Location: back. Onset: 04/20/2009. Duration: varies. Quality: discomforting. Pain management follow-up plan of care: Patient will continue medication management..              Provider:  Marchia Meiers. Vertell Limber MD  11/15/2020 04:05 PM    Dictation edited by: Marchia Meiers. Vertell Limber    CC Providers: [facility] [address] [place],  [place]  [address]   Kyle Cabbell MD  [address] [place], [place] [address]               Electronically signed by Marchia Meiers. Vertell Limber MD on 11/15/2020 04:05 PM

## 2021-01-01 NOTE — Anesthesia Procedure Notes (Signed)
Procedure Name: Intubation Date/Time: 01/01/2021 1:26 PM Performed by: Janace Litten, CRNA Pre-anesthesia Checklist: Patient identified, Emergency Drugs available, Suction available and Patient being monitored Patient Re-evaluated:Patient Re-evaluated prior to induction Oxygen Delivery Method: Circle System Utilized Preoxygenation: Pre-oxygenation with 100% oxygen Induction Type: IV induction Ventilation: Mask ventilation without difficulty Laryngoscope Size: Mac and 4 Grade View: Grade I Tube type: Oral Tube size: 7.5 mm Number of attempts: 1 Airway Equipment and Method: Stylet and Oral airway Placement Confirmation: ETT inserted through vocal cords under direct vision, positive ETCO2 and breath sounds checked- equal and bilateral Secured at: 21 cm Tube secured with: Tape Dental Injury: Teeth and Oropharynx as per pre-operative assessment

## 2021-01-01 NOTE — Progress Notes (Signed)
Awake, alert, conversant.  MAEW with good power.  Patient states legs hurt less than before his surgery.

## 2021-01-01 NOTE — Transfer of Care (Signed)
Immediate Anesthesia Transfer of Care Note  Patient: Jonathon Bray  Procedure(s) Performed: Lumbar three gill procedure with Lumbar three-four decompression and fusion, posterior versus transforaminal (Spine Lumbar) APPLICATION OF PULSE  Patient Location: PACU  Anesthesia Type:General  Level of Consciousness: awake, alert  and oriented  Airway & Oxygen Therapy: Patient Spontanous Breathing and Patient connected to nasal cannula oxygen  Post-op Assessment: Report given to RN and Post -op Vital signs reviewed and stable  Post vital signs: Reviewed and stable  Last Vitals:  Vitals Value Taken Time  BP 158/93 01/01/21 1602  Temp 36.4 C 01/01/21 1600  Pulse 102 01/01/21 1603  Resp 17 01/01/21 1603  SpO2 96 % 01/01/21 1603  Vitals shown include unvalidated device data.  Last Pain:  Vitals:   01/01/21 1055  TempSrc:   PainSc: 5       Patients Stated Pain Goal: 0 (34/03/70 9643)  Complications: No notable events documented.

## 2021-01-01 NOTE — Interval H&P Note (Signed)
History and Physical Interval Note:  01/01/2021 12:26 PM  Jonathon Bray  has presented today for surgery, with the diagnosis of Spondylolisthesis, Lumbar region.  The various methods of treatment have been discussed with the patient and family. After consideration of risks, benefits and other options for treatment, the patient has consented to  Procedure(s): Lumbar 3 gill procedure with Lumbar 3-4 decompression and fusion, posterior versus transforaminal (N/A) APPLICATION OF PULSE (N/A) as a surgical intervention.  The patient's history has been reviewed, patient examined, no change in status, stable for surgery.  I have reviewed the patient's chart and labs.  Questions were answered to the patient's satisfaction.     Peggyann Shoals

## 2021-01-02 LAB — GLUCOSE, CAPILLARY: Glucose-Capillary: 264 mg/dL — ABNORMAL HIGH (ref 70–99)

## 2021-01-02 MED ORDER — METHOCARBAMOL 500 MG PO TABS: 500.0000 mg | ORAL_TABLET | Freq: Four times a day (QID) | ORAL | 0 refills | Status: DC | PRN

## 2021-01-02 MED ORDER — HYDROCODONE-ACETAMINOPHEN 5-325 MG PO TABS: 1.0000 | ORAL_TABLET | ORAL | 0 refills | Status: AC | PRN

## 2021-01-02 NOTE — Plan of Care (Signed)
Patient alert and oriented, mae's well, voiding adequate amount of urine, swallowing without difficulty, no c/o pain at time of discharge. Patient discharged home with family. Script and discharged instructions given to patient. Patient and wife stated understanding of instructions given. Patient has an appointment with Dr. Vertell Limber in 2 weeks

## 2021-01-02 NOTE — Evaluation (Signed)
Physical Therapy Evaluation Patient Details Name: Jonathon Bray MRN: [id number] DOB: 1965-04-11 Today's Date: 01/02/2021  History of Present Illness  55 y.o. male presents to [facility] hospital on 01/01/2021 with severe BLE radiating pain. MRI demonstrates severe spinal stenosis at L3-4. Pt underwent L3-4 PLIF on 01/01/2021. PMH includes HTN, DM.  Clinical Impression  Pt presents to PT with deficits in activity tolerance, power, gait, balance, endurance, sensation. Pt with chronic LE sensation deficits due to peripheral neuropathy, but reports less significant tingling since surgery. Pt is able to perform all mobility required in the home without physical assistance, benefiting from UE support of walker to improve balance. Pt will benefit from continued aggressive mobilization to aide in a return to independence. PT recommends discharge home with RW.     Recommendations for follow up therapy are one component of a multi-disciplinary discharge planning process, led by the attending physician.  Recommendations may be updated based on patient status, additional functional criteria and insurance authorization.  Follow Up Recommendations No PT follow up    Equipment Recommendations  Rolling walker with 5" wheels    Recommendations for Other Services       Precautions / Restrictions Precautions Precautions: Fall;Back Precaution Booklet Issued:  (pt declines, reports having multiple copies of back precautions) Required Braces or Orthoses: Spinal Brace Spinal Brace: Lumbar corset;Applied in sitting position Restrictions Weight Bearing Restrictions: No      Mobility  Bed Mobility Overal bed mobility: Needs Assistance Bed Mobility: Rolling;Sidelying to Sit;Sit to Sidelying Rolling: Supervision Sidelying to sit: Supervision     Sit to sidelying: Supervision General bed mobility comments: cues for log roll technique    Transfers Overall transfer level: Needs assistance Equipment used:  Rolling walker (2 wheeled) Transfers: Sit to/from Stand Sit to Stand: Supervision            Ambulation/Gait Ambulation/Gait assistance: Supervision Gait Distance (Feet): 400 Feet (no device for 30') Assistive device: Rolling walker (2 wheeled);None Gait Pattern/deviations: Step-through pattern Gait velocity: reduced Gait velocity interpretation: 1.31 - 2.62 ft/sec, indicative of limited community ambulator General Gait Details: pt with slowed step-through gait, reduced gait speed and increased lateral sway without UE support  Stairs            Wheelchair Mobility    Modified Rankin (Stroke Patients Only)       Balance Overall balance assessment: Needs assistance Sitting-balance support: No upper extremity supported;Feet supported Sitting balance-Leahy Scale: Good     Standing balance support: No upper extremity supported Standing balance-Leahy Scale: Fair                               Pertinent Vitals/Pain Pain Assessment: Faces Faces Pain Scale: Hurts little more Pain Location: low back Pain Descriptors / Indicators: Grimacing Pain Intervention(s): Monitored during session    Home Living Family/patient expects to be discharged to:: Private residence Living Arrangements: Spouse/significant other Available Help at Discharge: Family;Available 24 hours/day Type of Home: House Home Access: Ramped entrance     Home Layout: One level Home Equipment: Cane - single point      Prior Function Level of Independence: Independent               Hand Dominance        Extremity/Trunk Assessment   Upper Extremity Assessment Upper Extremity Assessment: Overall WFL for tasks assessed    Lower Extremity Assessment Lower Extremity Assessment: Overall WFL for tasks assessed  Cervical / Trunk Assessment Cervical / Trunk Assessment: Other exceptions Cervical / Trunk Exceptions: s/p lumbar surgery  Communication   Communication: No  difficulties  Cognition Arousal/Alertness: Awake/alert Behavior During Therapy: WFL for tasks assessed/performed Overall Cognitive Status: Within Functional Limits for tasks assessed                                        General Comments General comments (skin integrity, edema, etc.): VSS on RA    Exercises     Assessment/Plan    PT Assessment Patient needs continued PT services  PT Problem List Decreased activity tolerance;Decreased balance;Decreased mobility;Decreased knowledge of use of DME;Decreased knowledge of precautions;Pain       PT Treatment Interventions DME instruction;Gait training;Functional mobility training;Therapeutic activities;Therapeutic exercise;Balance training;Neuromuscular re-education;Patient/family education    PT Goals (Current goals can be found in the Care Plan section)  Acute Rehab PT Goals Patient Stated Goal: to go home PT Goal Formulation: With patient Time For Goal Achievement: 01/07/21 Potential to Achieve Goals: Good    Frequency Min 5X/week   Barriers to discharge        Co-evaluation               AM-PAC PT "6 Clicks" Mobility  Outcome Measure Help needed turning from your back to your side while in a flat bed without using bedrails?: A Little Help needed moving from lying on your back to sitting on the side of a flat bed without using bedrails?: A Little Help needed moving to and from a bed to a chair (including a wheelchair)?: A Little Help needed standing up from a chair using your arms (e.g., wheelchair or bedside chair)?: A Little Help needed to walk in hospital room?: A Little Help needed climbing 3-5 steps with a railing? : A Little 6 Click Score: 18    End of Session Equipment Utilized During Treatment: Back brace Activity Tolerance: Patient tolerated treatment well Patient left: in bed;with call bell/phone within reach;with family/visitor present Nurse Communication: Mobility status PT Visit  Diagnosis: Other abnormalities of gait and mobility (R26.89);Pain Pain - part of body:  (back)    Time: 3875-6433 PT Time Calculation (min) (ACUTE ONLY): 17 min   Charges:   PT Evaluation $PT Eval Low Complexity: 1 Low          Zenaida Niece, PT, DPT Acute Rehabilitation Pager: [phone]   Zenaida Niece 01/02/2021, 9:02 AM

## 2021-01-02 NOTE — Progress Notes (Signed)
Subjective: Patient reports that he is doing well and is pleased with his postoperative status thus far. He has appropriate incisional pain. BLE pain has significantly improved. He is ambulating well. No acute events overnight.  Objective: Vital signs in last 24 hours: Temp:  [97.5 F (36.4 C)-98.4 F (36.9 C)] 98.2 F (36.8 C) (09/30 0341) Pulse Rate:  [88-98] 93 (09/30 0341) Resp:  [9-20] 20 (09/30 0341) BP: (123-188)/(73-115) 123/83 (09/30 0341) SpO2:  [95 %-99 %] 96 % (09/30 0341) Weight:  [95.3 kg] 95.3 kg (09/29 1012)  Intake/Output from previous day: 09/29 0701 - 09/30 0700 In: 1500 [I.V.:1500] Out: 1535 [Urine:1335; Blood:200] Intake/Output this shift: No intake/output data recorded.  Physical Exam: Patient is awake, A/O X 4, conversant, and in good spirits. They are in NAD and VSS. Doing well. Speech is fluent and appropriate. MAEW with good strength. Sensation to light touch is intact. PERLA, EOMI. CNs grossly intact. Dressing is clean dry intact. Incision is well approximated with no drainage, erythema, or edema.  Lab Results: No results for input(s): WBC, HGB, HCT, PLT in the last 72 hours. BMET No results for input(s): NA, K, CL, CO2, GLUCOSE, BUN, CREATININE, CALCIUM in the last 72 hours.  Studies/Results: DG Lumbar Spine 2-3 Views  Result Date: 01/01/2021 CLINICAL DATA:  Lumbar decompression and fusion EXAM: LUMBAR SPINE - 2-3 VIEW COMPARISON:  MRI 10/21/2020, radiograph 10/13/2020 FINDINGS: Three low resolution intraoperative spot views of the lumbar spine. The images demonstrate transpedicular screws at the lumbar spine, this appears to be at the L3-L4 level given compression deformity at L2 noted prior radiographs. IMPRESSION: Intraoperative fluoroscopic assistance provided during lumbar spine surgery Electronically Signed   By: Donavan Foil M.D.   On: 01/01/2021 17:42   DG C-Arm 1-60 Min-No Report  Result Date: 01/01/2021 Fluoroscopy was utilized by the  requesting physician.  No radiographic interpretation.   DG C-Arm 1-60 Min-No Report  Result Date: 01/01/2021 Fluoroscopy was utilized by the requesting physician.  No radiographic interpretation.   DG C-Arm 1-60 Min-No Report  Result Date: 01/01/2021 Fluoroscopy was utilized by the requesting physician.  No radiographic interpretation.    Assessment/Plan: Patient is post-op day 1 s/p L3 Gill procedure with L3-4 decompression and fusion. He is recovering well and reports a significant improvement in his preoperative symptoms.  His only complaint is mild incisional discomfort.  He has ambulated with nursing staff and is awaiting PT/OT evaluation.  Continue LSO brace when OOB. Continue working on pain control, mobility and ambulating patient. Will plan for discharge today.   LOS: 1 day     Marvis Moeller, DNP, NP-C 01/02/2021, 7:26 AM

## 2021-01-02 NOTE — Discharge Summary (Signed)
Physician Discharge Summary  Patient ID: Jonathon Bray MRN: [id number] DOB/AGE: April 07, 1965 55 y.o.  Admit date: 01/01/2021 Discharge date: 01/02/2021  Admission Diagnoses: Spondylolisthesis, Lumbar region, spondylolysis L 3, herniated lumbar disc, lumbar spinal stenosis, radiculopathy, lumbago L 34 level  Discharge Diagnoses: Spondylolisthesis, Lumbar region, spondylolysis L 3, herniated lumbar disc, lumbar spinal stenosis, radiculopathy, lumbago L 34 level Active Problems:   Spondylolysis, lumbar region   Discharged Condition: good  Hospital Course: The patient was admitted on 01/01/2021 and taken to the operating room where the patient underwent L3 Gill procedure with L3-4 decompression and fusion. The patient tolerated the procedure well and was taken to the recovery room and then to the floor in stable condition. The hospital course was routine. There were no complications. The wound remained clean dry and intact. Pt had appropriate back soreness. No complaints of leg pain or new N/T/W. The patient remained afebrile with stable vital signs, and tolerated a regular diet. The patient continued to increase activities, and pain was well controlled with oral pain medications.   Consults: None  Significant Diagnostic Studies: radiology: X-Ray: intraoperative   Treatments: surgery: Lumbar three gill procedure with Lumbar three-four decompression and fusion, posterior versus transforaminal (N/A) APPLICATION OF PULSE (N/A) pedicle screw fixation, posterolateral arthrodesis  Discharge Exam: Blood pressure (!) 155/96, pulse 95, temperature 97.6 F (36.4 C), temperature source Oral, resp. rate 18, height 5\' 5"  (1.651 m), weight 95.3 kg, SpO2 98 %.  Physical Exam: Patient is awake, A/O X 4, conversant, and in good spirits. They are in NAD and VSS. Doing well. Speech is fluent and appropriate. MAEW with good strength. Sensation to light touch is intact. PERLA, EOMI. CNs grossly intact. Dressing  is clean dry intact. Incision is well approximated with no drainage, erythema, or edema.  Disposition: Discharge disposition: 01-Home or Self Care        Allergies as of 01/02/2021       Reactions   Canagliflozin Other (See Comments)   Other reaction(s): Dizziness "passing out" "passing out" "passing out" "passing out"   Levofloxacin Rash   Lisinopril Other (See Comments), Cough   Ok with low dose   Olmesartan Other (See Comments)        Medication List     STOP taking these medications    naproxen sodium 220 MG tablet Commonly known as: ALEVE       TAKE these medications    CINNAMON PO Take 1 tablet by mouth daily.   Dulaglutide 3 MG/0.5ML Sopn Inject 3 mg into the skin once a week. Sunday's   HumuLIN N KwikPen 100 UNIT/ML Kiwkpen Generic drug: Insulin NPH (Human) (Isophane) Inject 30 Units into the skin in the morning and at bedtime.   HYDROcodone-acetaminophen 5-325 MG tablet Commonly known as: NORCO/VICODIN Take 1-2 tablets by mouth every 4 (four) hours as needed for moderate pain.   insulin lispro 100 UNIT/ML KwikPen Commonly known as: HUMALOG Inject 18 Units into the skin with breakfast, with lunch, and with evening meal.   lisinopril 5 MG tablet Commonly known as: ZESTRIL Take 5 mg by mouth daily.   metFORMIN 500 MG tablet Commonly known as: GLUCOPHAGE Take 1,000 mg by mouth 2 (two) times daily with a meal.   methocarbamol 500 MG tablet Commonly known as: ROBAXIN Take 1 tablet (500 mg total) by mouth every 6 (six) hours as needed for muscle spasms.         Signed: Marvis Moeller, DNP, NP-C 01/02/2021, 8:25 AM

## 2021-01-02 NOTE — Discharge Instructions (Addendum)
Wound Care Remove outer dressing in 2 -3 days Leave incision open to air. You may shower. Do not scrub directly on incision.  Do not put any creams, lotions, or ointments on incision. Activity Walk each and every day, increasing distance each day. No lifting greater than 5 lbs.  Avoid bending, arching, and twisting. No driving for 2 weeks; may ride as a passenger locally. If provided with back brace, wear when out of bed.  It is not necessary to wear in bed. Diet Resume your normal diet.  Return to Work Will be discussed at you follow up appointment. Call Your Doctor If Any of These Occur Redness, drainage, or swelling at the wound.  Temperature greater than 101 degrees. Severe pain not relieved by pain medication. Incision starts to come apart. Follow Up Appt Call today for appointment in 3-4 weeks ([phone]) or for problems.  If you have any hardware placed in your spine, you will need an x-ray before your appointment.

## 2021-01-05 ENCOUNTER — Encounter: Payer: Self-pay | Admitting: Internal Medicine

## 2021-08-06 ENCOUNTER — Other Ambulatory Visit: Payer: Self-pay | Admitting: Emergency Medicine

## 2021-08-06 DIAGNOSIS — R911 Solitary pulmonary nodule: Secondary | ICD-10-CM

## 2021-08-28 ENCOUNTER — Ambulatory Visit (HOSPITAL_BASED_OUTPATIENT_CLINIC_OR_DEPARTMENT_OTHER): Admission: RE | Admit: 2021-08-28 | Payer: 59 | Source: Ambulatory Visit

## 2022-01-18 ENCOUNTER — Ambulatory Visit: Payer: 59 | Admitting: Cardiology

## 2022-01-18 ENCOUNTER — Encounter: Payer: Self-pay | Admitting: Cardiology

## 2022-01-18 ENCOUNTER — Ambulatory Visit: Payer: Self-pay | Admitting: Cardiology

## 2022-01-18 VITALS — BP 175/107 | HR 88 | Temp 97.8°F | Resp 16 | Ht 65.0 in | Wt 220.0 lb

## 2022-01-18 DIAGNOSIS — R011 Cardiac murmur, unspecified: Secondary | ICD-10-CM

## 2022-01-18 DIAGNOSIS — I10 Essential (primary) hypertension: Secondary | ICD-10-CM

## 2022-01-18 DIAGNOSIS — R0989 Other specified symptoms and signs involving the circulatory and respiratory systems: Secondary | ICD-10-CM

## 2022-01-18 DIAGNOSIS — R0602 Shortness of breath: Secondary | ICD-10-CM

## 2022-01-18 DIAGNOSIS — E1165 Type 2 diabetes mellitus with hyperglycemia: Secondary | ICD-10-CM

## 2022-01-18 DIAGNOSIS — I700 Atherosclerosis of aorta: Secondary | ICD-10-CM

## 2022-01-18 MED ORDER — LOSARTAN POTASSIUM 50 MG PO TABS: 50.0000 mg | ORAL_TABLET | Freq: Every morning | ORAL | 2 refills | Status: DC

## 2022-01-18 MED ORDER — METOPROLOL SUCCINATE ER 25 MG PO TB24: 25.0000 mg | ORAL_TABLET | Freq: Every day | ORAL | 0 refills | Status: DC

## 2022-01-18 NOTE — Progress Notes (Signed)
ID:  Jonathon Bray, DOB 08-02-1965, MRN [id number]  PCP:  Glenda Chroman, MD  Cardiologist:  Rex Kras, DO, [facility] (established care 01/18/2022)  REASON FOR CONSULT: Shortness of breath  REQUESTING PHYSICIAN:  Glenda Chroman, MD [place] [address]  Chief Complaint  Patient presents with   Shortness of Breath   New Patient (Initial Visit)    Referred by Ma Hillock    HPI  Jonathon Bray is a 56 y.o. Caucasian male who presents to the clinic for evaluation of shortness of breath at the request of Vyas, Dhruv B, MD. His past medical history and cardiovascular risk factors include: Aortic atherosclerosis, benign essential hypertension, gout, hyperlipidemia, insulin-dependent diabetes mellitus type 2, family history of heart disease, obesity.   Patient presents to the office for evaluation of shortness of breath.  He noticed the symptoms over the last 2 or 3 weeks predominantly when he lays down he has a difficult time catching his breath.  Currently has 2 pillow orthopnea, and minimal lower extremity swelling.  Office blood pressures at today's office visit are not well controlled.  He has been out of his medications for the last 3 to 4 days.  Waiting for refill.  In addition, patient states that his blood pressures are usually elevated at doctor's offices.  When asked what his normal blood pressures at home patient states that he does not check it often but when it is checked it is usually 140/85.  Patient has been a diabetic for the last 20 years and has been on insulin for the last 7 years.  He had an echo back in 2015 but no prior stress tests.   FUNCTIONAL STATUS: No structured exercise program or daily routine.  Limited due o prior surgery.  ALLERGIES: Allergies  Allergen Reactions   Canagliflozin Other (See Comments)    Other reaction(s): Dizziness "passing out" "passing out" "passing out" "passing out"    Levofloxacin Rash   Lisinopril Other  (See Comments) and Cough    Ok with low dose    MEDICATION LIST PRIOR TO VISIT: Current Meds  Medication Sig   insulin lispro (HUMALOG) 100 UNIT/ML KwikPen Inject 18 Units into the skin with breakfast, with lunch, and with evening meal.   Insulin NPH, Human,, Isophane, (HUMULIN N KWIKPEN) 100 UNIT/ML Kiwkpen Inject 30 Units into the skin in the morning and at bedtime.   losartan (COZAAR) 50 MG tablet Take 1 tablet (50 mg total) by mouth in the morning.   metFORMIN (GLUCOPHAGE) 500 MG tablet Take 1,000 mg by mouth 2 (two) times daily with a meal.   metoprolol succinate (TOPROL XL) 25 MG 24 hr tablet Take 1 tablet (25 mg total) by mouth daily at 10 pm.   [DISCONTINUED] lisinopril (PRINIVIL,ZESTRIL) 5 MG tablet Take 5 mg by mouth daily.     PAST MEDICAL HISTORY: Past Medical History:  Diagnosis Date   Diabetes mellitus without complication ([place])    type two - [place] ON RIGHT UPPER OUTER ARM   Hypertension    PONV (postoperative nausea and vomiting)     PAST SURGICAL HISTORY: Past Surgical History:  Procedure Laterality Date   COLONOSCOPY  2020   KYPHOPLASTY N/A 06/19/2020   Procedure: ATTEMPTED LUMBAR TWO KYPHOPLASTY;  Surgeon: Ashok Pall, MD;  Location: [place];  Service: Neurosurgery;  Laterality: N/A;    FAMILY HISTORY: The patient family history includes Diabetes in his father and mother; Heart attack in his brother and sister;  Heart disease in his father; Hypertension in his sister; Other (age of onset: 4) in his brother; Other (age of onset: 62) in his brother.  SOCIAL HISTORY:  The patient  reports that he has never smoked. He has never used smokeless tobacco. He reports that he does not drink alcohol and does not use drugs.  REVIEW OF SYSTEMS: Review of Systems  Cardiovascular:  Positive for leg swelling and paroxysmal nocturnal dyspnea (one episode (see HPI)). Negative for chest pain, claudication, dyspnea on exertion, irregular heartbeat, near-syncope, orthopnea,  palpitations and syncope.  Respiratory:  Negative for shortness of breath.   Hematologic/Lymphatic: Negative for bleeding problem.  Musculoskeletal:  Negative for muscle cramps and myalgias.  Neurological:  Negative for dizziness and light-headedness.    PHYSICAL EXAM:    01/18/2022    3:32 PM 01/18/2022    3:25 PM 01/02/2021    7:35 AM  Vitals with BMI  Height  5' 5"    Weight  220 lbs   BMI  63.84   Systolic 665 993 570  Diastolic 177 939 96  Pulse 88 88 95   CONSTITUTIONAL: Appears older than stated age, hemodynamically stable, no acute distress  SKIN: Skin is warm and dry. No rash noted. No cyanosis. No pallor. No jaundice HEAD: Normocephalic and atraumatic.  EYES: No scleral icterus MOUTH/THROAT: Moist oral membranes.  Poor oral dentition NECK: No JVD present. No thyromegaly noted.  Bilateral carotid bruits, left subclavian bruit. CHEST Normal respiratory effort. No intercostal retractions  LUNGS: Decreased breath sounds bilaterally likely secondary to poor inspiratory effort.  No stridor. No wheezes. No rales.  CARDIOVASCULAR: Regular rate and rhythm, positive S1-S2, 3 out of 6 systolic ejection murmur at the second right intercostal space, 3 out of 6 holosystolic murmur, no rubs or gallops appreciated. ABDOMINAL: Obese, soft, nontender, nondistended, positive bowel sounds in all 4 quadrants no apparent ascites.  EXTREMITIES: +1 bilateral peripheral edema, warm to touch HEMATOLOGIC: No significant bruising NEUROLOGIC: Oriented to person, place, and time. Nonfocal. Normal muscle tone.  PSYCHIATRIC: Normal mood and affect. Normal behavior. Cooperative  CARDIAC DATABASE: EKG: 01/18/2022: Normal sinus rhythm, 93 bpm, without underlying ischemia or injury pattern.  Echocardiogram: 01/25/2014: LVEF 60%.    Stress Testing: No results found for this or any previous visit from the past 1095 days.  Heart Catheterization: None  LABORATORY DATA:    Latest Ref Rng & Units  12/26/2020    2:07 PM 06/19/2020    1:51 PM 01/25/2014    8:20 AM  CBC  WBC 4.0 - 10.5 K/uL 11.9  9.3  7.8   Hemoglobin 13.0 - 17.0 g/dL 15.9  17.5  17.8   Hematocrit 39.0 - 52.0 % 45.0  49.0  49.4   Platelets 150 - 400 K/uL 257  233  168        Latest Ref Rng & Units 12/26/2020    2:07 PM 06/19/2020    1:51 PM 01/25/2014    8:20 AM  CMP  Glucose 70 - 99 mg/dL 158  171  253   BUN 6 - 20 mg/dL 18  15  16    Creatinine 0.61 - 1.24 mg/dL 0.90  0.88  0.89   Sodium 135 - 145 mmol/L 138  133  136   Potassium 3.5 - 5.1 mmol/L 4.3  4.4  4.8   Chloride 98 - 111 mmol/L 102  99  100   CO2 22 - 32 mmol/L 28  27  25    Calcium 8.9 - 10.3 mg/dL  9.4  9.3  9.2     Lipid Panel     Component Value Date/Time   CHOL 159 01/25/2014 0456   TRIG 223 (H) 01/25/2014 0456   HDL 40 01/25/2014 0456   CHOLHDL 4.0 01/25/2014 0456   VLDL 45 (H) 01/25/2014 0456   LDLCALC 74 01/25/2014 0456    No components found for: "NTPROBNP" No results for input(s): "PROBNP" in the last 8760 hours. No results for input(s): "TSH" in the last 8760 hours.  BMP No results for input(s): "NA", "K", "CL", "CO2", "GLUCOSE", "BUN", "CREATININE", "CALCIUM", "GFRNONAA", "GFRAA" in the last 8760 hours.  HEMOGLOBIN A1C Lab Results  Component Value Date   HGBA1C 9.3 (H) 12/26/2020   MPG 220.21 12/26/2020    IMPRESSION:    ICD-10-CM   1. Shortness of breath  R06.02 EKG 12-Lead    Pro b natriuretic peptide (BNP)    TSH    losartan (COZAAR) 50 MG tablet    ECHOCARDIOGRAM COMPLETE    2. Essential hypertension  I10 losartan (COZAAR) 50 MG tablet    3. Type 2 diabetes mellitus with hyperglycemia, with long-term current use of insulin (HCC)  E11.65 Lipid Panel With LDL/HDL Ratio   Z79.4 LDL cholesterol, direct    CMP14+EGFR    CT CARDIAC SCORING ([facility] LOCATIONS ONLY)    4. Atherosclerosis of aorta (HCC)  I70.0 CT CARDIAC SCORING ([facility] LOCATIONS ONLY)    5. Bilateral carotid bruits  R09.89 Carotid    CANCELED: PCV  CAROTID DUPLEX (BILATERAL)    6. Murmur  R01.1 metoprolol succinate (TOPROL XL) 25 MG 24 hr tablet    ECHOCARDIOGRAM COMPLETE       RECOMMENDATIONS: Jonathon Bray is a 56 y.o. Caucasian male whose past medical history and cardiac risk factors include: Aortic atherosclerosis, benign essential hypertension, gout, hyperlipidemia, insulin-dependent diabetes mellitus type 2, family history of heart disease, obesity.   Shortness of breath Likely secondary to valvular heart disease and uncontrolled hypertension Echo will be ordered to evaluate for structural heart disease and left ventricular systolic function. Start losartan 50 mg p.o. every morning. Start Toprol-XL 25 mg p.o. every afternoon. Given his long-term history of diabetes he likely needs to have an ischemic work-up. However, would like to optimize his blood pressures and evaluate for valvular heart disease prior to ischemic work-up. We will check a coronary calcium score for further risk stratification and the study will also help stratify aortic valve calcification. Check BNP  Essential hypertension Blood pressures are not well controlled. Discontinue lisinopril Start losartan and Toprol-XL as discussed above Patient is asked to keep a log of his blood pressures and to bring it in at the next office visit for review with PCP  Type 2 diabetes mellitus with hyperglycemia, with long-term current use of insulin ([place]) Reemphasized importance of glycemic control. Currently on insulin and metformin. Consider Jardiance/Farxiga-for now we will defer to PCP. Reemphasized importance of glycemic control, being on ACE or ARB for renal protection. He has been a diabetic for the last 20 years but still not on statin therapy. From a cardiovascular standpoint would recommend statin therapy given his elevated ASCVD risk score and diabetes.  However, we will check a fasting lipid profile for further evaluation.  Bilateral carotid  bruits Likely secondary to underlying valve disease but superimposed carotid artery disease cannot be ruled out. Plan carotid duplex  Murmur Based on physical examination I suspect he has a degree of aortic stenosis (at least moderate) he also has findings of mitral  regurgitation murmur either due to primary pathology or secondary to Galliverdin  phenomena. Echo will be ordered to evaluate for structural heart disease and left ventricular systolic function. Further recommendations to follow.  Given my suspicion for underlying aortic valve stenosis I have advised the patient to seek medical attention by going to the closest ER via EMS if he has worsening shortness of breath, chest pain, or syncope.  Patient verbalized understanding.  Data Reviewed: I have independently reviewed external notes provided by the referring provider as part of this office visit.   I have independently reviewed prior echo results, carotid duplex results, EKG as part of medical decision making. I have ordered the following tests:  Orders Placed This Encounter  Procedures   CT CARDIAC SCORING ([facility] LOCATIONS ONLY)    Standing Status:   Future    Standing Expiration Date:   01/19/2023    Order Specific Question:   Preferred imaging location?    Answer:   [facility]    Order Specific Question:   Release to patient    Answer:   Immediate   Lipid Panel With LDL/HDL Ratio   LDL cholesterol, direct   CMP14+EGFR   Pro b natriuretic peptide (BNP)    Standing Status:   Future    Standing Expiration Date:   04/20/2022   TSH   EKG 12-Lead   ECHOCARDIOGRAM COMPLETE    Standing Status:   Future    Standing Expiration Date:   01/19/2023    Scheduling Instructions:     Evaluate for aortic stenosis and mitral regurgitation    Order Specific Question:   Where should this test be performed    Answer:   [facility]    Order Specific Question:   Perflutren DEFINITY (image enhancing agent) should be administered unless  hypersensitivity or allergy exist    Answer:   Administer Perflutren    Order Specific Question:   Reason for exam-Echo    Answer:   Murmur R01.1  I have made medications changes at today's encounter as noted above.  FINAL MEDICATION LIST END OF ENCOUNTER: Meds ordered this encounter  Medications   losartan (COZAAR) 50 MG tablet    Sig: Take 1 tablet (50 mg total) by mouth in the morning.    Dispense:  30 tablet    Refill:  2   metoprolol succinate (TOPROL XL) 25 MG 24 hr tablet    Sig: Take 1 tablet (25 mg total) by mouth daily at 10 pm.    Dispense:  30 tablet    Refill:  0    Medications Discontinued During This Encounter  Medication Reason   CINNAMON PO    Dulaglutide 3 MG/0.5ML SOPN    methocarbamol (ROBAXIN) 500 MG tablet    lisinopril (PRINIVIL,ZESTRIL) 5 MG tablet Change in therapy     Current Outpatient Medications:    insulin lispro (HUMALOG) 100 UNIT/ML KwikPen, Inject 18 Units into the skin with breakfast, with lunch, and with evening meal., Disp: , Rfl:    Insulin NPH, Human,, Isophane, (HUMULIN N KWIKPEN) 100 UNIT/ML Kiwkpen, Inject 30 Units into the skin in the morning and at bedtime., Disp: , Rfl:    losartan (COZAAR) 50 MG tablet, Take 1 tablet (50 mg total) by mouth in the morning., Disp: 30 tablet, Rfl: 2   metFORMIN (GLUCOPHAGE) 500 MG tablet, Take 1,000 mg by mouth 2 (two) times daily with a meal., Disp: , Rfl:    metoprolol succinate (TOPROL XL) 25 MG 24 hr tablet,  Take 1 tablet (25 mg total) by mouth daily at 10 pm., Disp: 30 tablet, Rfl: 0   indomethacin (INDOCIN) 50 MG capsule, Take 50 mg by mouth 3 (three) times daily as needed., Disp: , Rfl:   Orders Placed This Encounter  Procedures   CT CARDIAC SCORING ([facility] LOCATIONS ONLY)   Lipid Panel With LDL/HDL Ratio   LDL cholesterol, direct   CMP14+EGFR   Pro b natriuretic peptide (BNP)   TSH   EKG 12-Lead   ECHOCARDIOGRAM COMPLETE   Carotid    There are no Patient Instructions on file for this visit.    --Continue cardiac medications as reconciled in final medication list. --Return in about 6 weeks (around 03/04/2022) for Reevaluation of, Dyspnea, Review test results. or sooner if needed. --Continue follow-up with your primary care physician regarding the management of your other chronic comorbid conditions.  Patient's questions and concerns were addressed to his satisfaction. He voices understanding of the instructions provided during this encounter.   This note was created using a voice recognition software as a result there may be grammatical errors inadvertently enclosed that do not reflect the nature of this encounter. Every attempt is made to correct such errors.  Rex Kras, [place], [facility]  Pager: [phone] Office: [phone]

## 2022-01-25 ENCOUNTER — Ambulatory Visit: Payer: 59 | Admitting: Neurology

## 2022-02-16 ENCOUNTER — Ambulatory Visit (HOSPITAL_COMMUNITY)
Admission: RE | Admit: 2022-02-16 | Discharge: 2022-02-16 | Disposition: A | Discharge status: Home / Self Care | Payer: 59 | Source: Ambulatory Visit | Attending: Cardiology | Admitting: Cardiology

## 2022-02-16 ENCOUNTER — Ambulatory Visit (HOSPITAL_BASED_OUTPATIENT_CLINIC_OR_DEPARTMENT_OTHER)
Admission: RE | Admit: 2022-02-16 | Discharge: 2022-02-16 | Disposition: A | Discharge status: Home / Self Care | Payer: 59 | Source: Ambulatory Visit | Attending: Cardiology | Admitting: Cardiology

## 2022-02-16 DIAGNOSIS — R0989 Other specified symptoms and signs involving the circulatory and respiratory systems: Secondary | ICD-10-CM | POA: Diagnosis present

## 2022-02-16 DIAGNOSIS — E119 Type 2 diabetes mellitus without complications: Secondary | ICD-10-CM | POA: Insufficient documentation

## 2022-02-16 DIAGNOSIS — R011 Cardiac murmur, unspecified: Secondary | ICD-10-CM

## 2022-02-16 DIAGNOSIS — R0602 Shortness of breath: Secondary | ICD-10-CM

## 2022-02-16 DIAGNOSIS — I10 Essential (primary) hypertension: Secondary | ICD-10-CM | POA: Diagnosis not present

## 2022-02-22 ENCOUNTER — Other Ambulatory Visit: Payer: Self-pay | Admitting: Cardiology

## 2022-02-23 ENCOUNTER — Telehealth: Payer: Self-pay

## 2022-02-23 LAB — LDL CHOLESTEROL, DIRECT: LDL Direct: 119 mg/dL — ABNORMAL HIGH (ref 0–99)

## 2022-02-23 LAB — CMP14+EGFR
ALT: 10 IU/L (ref 0–44)
AST: 12 IU/L (ref 0–40)
Albumin/Globulin Ratio: 1.5 (ref 1.2–2.2)
Albumin: 4.2 g/dL (ref 3.8–4.9)
Alkaline Phosphatase: 73 IU/L (ref 44–121)
BUN/Creatinine Ratio: 19 (ref 9–20)
BUN: 18 mg/dL (ref 6–24)
Bilirubin Total: 0.6 mg/dL (ref 0.0–1.2)
CO2: 26 mmol/L (ref 20–29)
Calcium: 9.9 mg/dL (ref 8.7–10.2)
Chloride: 95 mmol/L — ABNORMAL LOW (ref 96–106)
Creatinine, Ser: 0.95 mg/dL (ref 0.76–1.27)
Globulin, Total: 2.8 g/dL (ref 1.5–4.5)
Glucose: 352 mg/dL — ABNORMAL HIGH (ref 70–99)
Potassium: 5.3 mmol/L — ABNORMAL HIGH (ref 3.5–5.2)
Sodium: 133 mmol/L — ABNORMAL LOW (ref 134–144)
Total Protein: 7 g/dL (ref 6.0–8.5)
eGFR: 94 mL/min/{1.73_m2} (ref >59)

## 2022-02-23 LAB — TSH: TSH: 1.13 u[IU]/mL (ref 0.450–4.500)

## 2022-02-23 LAB — LIPID PANEL WITH LDL/HDL RATIO
Cholesterol, Total: 193 mg/dL (ref 100–199)
HDL: 47 mg/dL (ref >39)
LDL Chol Calc (NIH): 114 mg/dL — ABNORMAL HIGH (ref 0–99)
LDL/HDL Ratio: 2.4 ratio (ref 0.0–3.6)
Triglycerides: 181 mg/dL — ABNORMAL HIGH (ref 0–149)
VLDL Cholesterol Cal: 32 mg/dL (ref 5–40)

## 2022-02-23 LAB — PRO B NATRIURETIC PEPTIDE: NT-Pro BNP: 251 pg/mL — ABNORMAL HIGH (ref 0–210)

## 2022-02-23 NOTE — Telephone Encounter (Signed)
Spoke with patient, told him labs would be discussed at next visit. Patient was confused about where to get Calcium score performed. I advised him to call [phone] option 1 then option 3 for scheduling his appointment for calcium score.

## 2022-02-23 NOTE — Telephone Encounter (Signed)
-----   Message from [place], [place] sent at 02/23/2022  2:56 PM EST ----- We will review the results at the upcoming office visit.

## 2022-02-24 NOTE — Progress Notes (Signed)
Called patient, NA, LMAM

## 2022-03-01 NOTE — Progress Notes (Signed)
Called and spoke with patient regarding his CAD results.

## 2022-03-01 NOTE — Progress Notes (Signed)
Called and spoke with patient regarding his CAD and Lab results. Patient does not want to start any medications until he comes to see you on the 30th. He stated that he has also stopped taking the Metoprolol because it made him pass out. He will talk to you about it more at his office visit.

## 2022-03-04 ENCOUNTER — Ambulatory Visit: Payer: 59

## 2022-03-04 LAB — ECHOCARDIOGRAM COMPLETE
AR max vel: 0.87 cm2
AV Area VTI: 0.92 cm2
AV Area mean vel: 0.87 cm2
AV Mean grad: 32 mmHg
AV Peak grad: 50.7 mmHg
Ao pk vel: 3.56 m/s
Area-P 1/2: 3.01 cm2
P 1/2 time: 303 msec
S' Lateral: 2.5 cm

## 2022-03-05 ENCOUNTER — Telehealth: Payer: Self-pay

## 2022-03-05 NOTE — Telephone Encounter (Signed)
Called to give results no answer.

## 2022-03-05 NOTE — Telephone Encounter (Signed)
-----   Message from [place], [place] sent at 03/04/2022  8:24 PM EST ----- Left ventricular ejection fraction (pumping activity) of the heart is within the normal limit.  Findings suggestive of aortic stenosis Details will be reviewed at the next office visit.   Sunit Minden, DO, [facility]

## 2022-03-05 NOTE — Telephone Encounter (Signed)
-----   Message from [place], [place] sent at 03/04/2022  8:24 PM EST ----- Left ventricular ejection fraction (pumping activity) of the heart is within the normal limit.  Findings suggestive of aortic stenosis Details will be reviewed at the next office visit.   Sunit Cochiti, DO, [facility]

## 2022-03-05 NOTE — Telephone Encounter (Signed)
Patient made aware of results.  

## 2022-03-22 ENCOUNTER — Encounter: Payer: Self-pay | Admitting: Cardiology

## 2022-03-22 ENCOUNTER — Ambulatory Visit: Payer: 59 | Admitting: Cardiology

## 2022-03-22 VITALS — BP 142/88 | HR 86 | Resp 16 | Ht 65.0 in | Wt 214.8 lb

## 2022-03-22 DIAGNOSIS — I700 Atherosclerosis of aorta: Secondary | ICD-10-CM

## 2022-03-22 DIAGNOSIS — I77810 Thoracic aortic ectasia: Secondary | ICD-10-CM

## 2022-03-22 DIAGNOSIS — I350 Nonrheumatic aortic (valve) stenosis: Secondary | ICD-10-CM

## 2022-03-22 DIAGNOSIS — Z794 Long term (current) use of insulin: Secondary | ICD-10-CM

## 2022-03-22 DIAGNOSIS — R0602 Shortness of breath: Secondary | ICD-10-CM

## 2022-03-22 DIAGNOSIS — I10 Essential (primary) hypertension: Secondary | ICD-10-CM

## 2022-03-22 MED ORDER — ASPIRIN 81 MG PO TBEC: 81.0000 mg | DELAYED_RELEASE_TABLET | Freq: Every day | ORAL | 12 refills | Status: DC

## 2022-03-22 MED ORDER — ATORVASTATIN CALCIUM 40 MG PO TABS: 40.0000 mg | ORAL_TABLET | Freq: Every day | ORAL | 0 refills | Status: DC

## 2022-03-22 MED ORDER — METOPROLOL SUCCINATE ER 25 MG PO TB24: 12.5000 mg | ORAL_TABLET | Freq: Every morning | ORAL | 0 refills | Status: DC

## 2022-03-22 NOTE — Patient Instructions (Signed)
Take Toprol-XL 12.5 mg p.o. every morning. Take losartan in the p.m. Start atorvastatin at night as prescribed Start aspirin 81 mg p.o. daily. Fasting labs in 6 weeks to reevaluate your lipids and liver function after starting cholesterol medication. Your coronary calcium score scheduled for 03/31/2022 at 10 AM. Exercise stress test prior to the next office visit.

## 2022-03-22 NOTE — Progress Notes (Signed)
ID:  Jonathon Bray, DOB Apr 12, 1965, MRN [id number]  PCP:  Glenda Chroman, MD  Cardiologist:  Rex Kras, DO, [facility] (established care 01/18/2022)  Date: 03/22/22 Last Office Visit: 01/18/2022  Chief Complaint  Patient presents with   Shortness of Breath   Follow-up    6 weeks    HPI  Jonathon Bray is a 56 y.o. 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   Referred to the practice for evaluation of shortness of breath.  During initial consultation patient's symptoms were concerning for possible heart failure and physical examination notable for valvular heart disease specifically aortic stenosis.  Had ordered labs to evaluate his lipids, A1c, and BMP.  Results reviewed with him and noted below for further reference.  In addition, patient was recommended to be on losartan and Toprol-XL.  He is only taking losartan for now.  Patient states that his home blood pressures are around 135 mmHg.  Carotid duplex does not illustrate any hemodynamically significant stenosis.  Bilateral carotid bruits are likely secondary to his underlying valvular disease.   Since last visit he denies heart failure symptoms, angina pectoris, or syncope.  Patient states that his shortness of breath is improved by at least 90%.   FUNCTIONAL STATUS: No structured exercise program or daily routine.  Limited due o prior surgery.  ALLERGIES: Allergies  Allergen Reactions   Canagliflozin Other (See Comments)    Other reaction(s): Dizziness "passing out" "passing out" "passing out" "passing out"    Levofloxacin Rash   Lisinopril Other (See Comments) and Cough    Ok with low dose    MEDICATION LIST PRIOR TO VISIT: Current Meds   Medication Sig   aspirin EC 81 MG tablet Take 1 tablet (81 mg total) by mouth daily. Swallow whole.   atorvastatin (LIPITOR) 40 MG tablet Take 1 tablet (40 mg total) by mouth at bedtime.   indomethacin (INDOCIN) 50 MG capsule Take 50 mg by mouth 3 (three) times daily as needed.   insulin lispro (HUMALOG) 100 UNIT/ML KwikPen Inject 18 Units into the skin with breakfast, with lunch, and with evening meal.   Insulin NPH, Human,, Isophane, (HUMULIN N KWIKPEN) 100 UNIT/ML Kiwkpen Inject 30 Units into the skin in the morning and at bedtime.   losartan (COZAAR) 50 MG tablet Take 1 tablet (50 mg total) by mouth in the morning.   metFORMIN (GLUCOPHAGE) 500 MG tablet Take 1,000 mg by mouth 2 (two) times daily with a meal.   metoprolol succinate (TOPROL XL) 25 MG 24 hr tablet Take 0.5 tablets (12.5 mg total) by mouth every morning.     PAST MEDICAL HISTORY: Past Medical History:  Diagnosis Date   Aortic stenosis    Diabetes mellitus without complication ([place])    type two - [place] ON RIGHT UPPER OUTER ARM   Hypertension    PONV (postoperative nausea and vomiting)     PAST SURGICAL HISTORY: Past Surgical History:  Procedure Laterality Date   COLONOSCOPY  2020   KYPHOPLASTY N/A 06/19/2020   Procedure: ATTEMPTED LUMBAR TWO KYPHOPLASTY;  Surgeon: Ashok Pall, MD;  Location: [facility];  Service: Neurosurgery;  Laterality: N/A;    FAMILY HISTORY: The patient family history includes Diabetes in his brother, father, and mother; Heart attack in his brother and sister; Heart disease  in his father; Hypertension in his sister; Other (age of onset: 46) in his brother; Other (age of onset: 86) in his brother.  SOCIAL HISTORY:  The patient  reports that he has never smoked. He has never used smokeless tobacco. He reports that he does not drink alcohol and does not use drugs.  REVIEW OF SYSTEMS: Review of Systems  Cardiovascular:  Negative for chest pain, claudication, dyspnea on exertion, irregular  heartbeat, leg swelling, near-syncope, orthopnea, palpitations, paroxysmal nocturnal dyspnea and syncope.  Respiratory:  Negative for shortness of breath.   Hematologic/Lymphatic: Negative for bleeding problem.  Musculoskeletal:  Negative for muscle cramps and myalgias.  Neurological:  Negative for dizziness and light-headedness.    PHYSICAL EXAM:    03/22/2022    3:47 PM 01/18/2022    3:32 PM 01/18/2022    3:25 PM  Vitals with BMI  Height _0   _1   Weight 214 lbs 13 oz  220 lbs  BMI 85.46  27.03  Systolic 500 938 182  Diastolic 88 993 716  Pulse 86 88 88   CONSTITUTIONAL: Appears older than stated age, hemodynamically stable, no acute distress  SKIN: Skin is warm and dry. No rash noted. No cyanosis. No pallor. No jaundice HEAD: Normocephalic and atraumatic.  EYES: No scleral icterus MOUTH/THROAT: Moist oral membranes.  Poor oral dentition NECK: No JVD present. No thyromegaly noted.  Bilateral carotid bruits, left subclavian bruit. CHEST Normal respiratory effort. No intercostal retractions  LUNGS: Decreased breath sounds bilaterally likely secondary to poor inspiratory effort.  No stridor. No wheezes. No rales.  CARDIOVASCULAR: Regular rate and rhythm, positive S1-S2, 3 out of 6 systolic ejection murmur at the second right intercostal space,  no rubs or gallops appreciated. ABDOMINAL: Obese, soft, nontender, nondistended, positive bowel sounds in all 4 quadrants no apparent ascites.  EXTREMITIES: +trace bilateral peripheral edema, warm to touch HEMATOLOGIC: No significant bruising NEUROLOGIC: Oriented to person, place, and time. Nonfocal. Normal muscle tone.  PSYCHIATRIC: Normal mood and affect. Normal behavior. Cooperative  CARDIAC DATABASE: EKG: 01/18/2022: Normal sinus rhythm, 93 bpm, without underlying ischemia or injury pattern.  Echocardiogram: 01/25/2014: LVEF 60%.    02/16/2022: 1. Left ventricular ejection fraction, by estimation, is 60 to 65%. Left  ventricular ejection fraction by PLAX is 70 %. The left ventricle has normal function. The left ventricle has no regional wall motion abnormalities. The left ventricular internal cavity size was mildly dilated. Left ventricular diastolic parameters were normal. 2. Right ventricular systolic function is normal. The right ventricular size is normal. 3. The mitral valve is grossly normal. No evidence of mitral valve regurgitation. 4. The aortic valve is calcified. There is mild thickening of the aortic valve. Aortic valve regurgitation is mild. Moderate aortic valve stenosis. Aortic valve mean gradient measures 32.0 mmHg. Aortic valve Vmax measures 3.56 m/s.   Stress Testing: None  Heart Catheterization: None  Carotid Duplex 02/16/2022: Right Carotid: The extracranial vessels were near-normal with only minimal wall thickening or plaque.  Left Carotid: The extracranial vessels were near-normal with only minimal wall thickening or plaque.  Vertebrals: Bilateral vertebral arteries demonstrate antegrade flow.  Subclavians: Normal flow hemodynamics were seen in bilateral subclavian arteries.   LABORATORY DATA:    Latest Ref Rng & Units 12/26/2020    2:07 PM 06/19/2020    1:51 PM 01/25/2014    8:20 AM  CBC  WBC 4.0 - 10.5 K/uL 11.9  9.3  7.8   Hemoglobin 13.0 - 17.0 g/dL 15.9  17.5  17.8  Hematocrit 39.0 - 52.0 % 45.0  49.0  49.4   Platelets 150 - 400 K/uL 257  233  168        Latest Ref Rng & Units 02/22/2022   10:04 AM 12/26/2020    2:07 PM 06/19/2020    1:51 PM  CMP  Glucose 70 - 99 mg/dL 352  158  171   BUN 6 - 24 mg/dL _0 Creatinine 0.76 - 1.27 mg/dL 0.95  0.90  0.88   Sodium 134 - 144 mmol/L 133  138  133   Potassium 3.5 - 5.2 mmol/L 5.3  4.3  4.4   Chloride 96 - 106 mmol/L 95  102  99   CO2 20 - 29 mmol/L _1 Calcium 8.7 - 10.2 mg/dL 9.9  9.4  9.3   Total Protein 6.0 - 8.5 g/dL 7.0     Total Bilirubin 0.0 - 1.2 mg/dL 0.6     Alkaline Phos 44 - 121 IU/L  73     AST 0 - 40 IU/L 12     ALT 0 - 44 IU/L 10       Lipid Panel  Lab Results  Component Value Date   CHOL 193 02/22/2022   HDL 47 02/22/2022   LDLCALC 114 (H) 02/22/2022   LDLDIRECT 119 (H) 02/22/2022   TRIG 181 (H) 02/22/2022   CHOLHDL 4.0 01/25/2014    No components found for: "NTPROBNP" Recent Labs    02/22/22 1005  PROBNP 251*   Recent Labs    02/22/22 1004  TSH 1.130    BMP Recent Labs    02/22/22 1004  NA 133*  K 5.3*  CL 95*  CO2 26  GLUCOSE 352*  BUN 18  CREATININE 0.95  CALCIUM 9.9    HEMOGLOBIN A1C Lab Results  Component Value Date   HGBA1C 9.3 (H) 12/26/2020   MPG 220.21 12/26/2020    IMPRESSION:    ICD-10-CM   1. Shortness of breath  R06.02 PCV MYOCARDIAL PERFUSION WO LEXISCAN    2. Nonrheumatic aortic valve stenosis  I35.0 metoprolol succinate (TOPROL XL) 25 MG 24 hr tablet    PCV MYOCARDIAL PERFUSION WO LEXISCAN    PCV ECHOCARDIOGRAM COMPLETE    3. Essential hypertension  I10 metoprolol succinate (TOPROL XL) 25 MG 24 hr tablet    PCV MYOCARDIAL PERFUSION WO LEXISCAN    4. Type 2 diabetes mellitus with hyperglycemia, with long-term current use of insulin (HCC)  E11.65 atorvastatin (LIPITOR) 40 MG tablet   Z79.4 PCV MYOCARDIAL PERFUSION WO LEXISCAN    Lipid Panel With LDL/HDL Ratio    LDL cholesterol, direct    CMP14+EGFR    5. Atherosclerosis of aorta (HCC)  I70.0 atorvastatin (LIPITOR) 40 MG tablet    aspirin EC 81 MG tablet    PCV MYOCARDIAL PERFUSION WO LEXISCAN    Lipid Panel With LDL/HDL Ratio    LDL cholesterol, direct    CMP14+EGFR    6. Ascending aorta dilatation (HCC)  I77.810 PCV ECHOCARDIOGRAM COMPLETE       RECOMMENDATIONS: Leshawn Straka is a 55 y.o. 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   Shortness of breath Improving. Likely secondary to underlying valvular heart disease and hypertension. However, given his risk  factors underlying ischemia cannot be ruled out. NT-proBNP -above normal limits but not diagnostic of heart failure.  Clinically euvolemic. Blood pressures are better controlled but currently not at goal. Echocardiogram results reviewed with the patient at today's visit.  Nonrheumatic aortic valve stenosis Moderate aortic stenosis based on hemodynamics, as of November 2023.  Denies anginal discomfort, heart failure symptoms, or syncope.  Patient is educated on if the symptoms surface he needs to go to the closest ER via EMS for further evaluation and management.  Would like to repeat an echo in 6 months to reevaluate disease progression.  Continue losartan. Recommended starting Toprol-XL 12.5 mg p.o. every morning. Avoid excessive diuresis. Re emphasized the importance of blood pressure management  Essential hypertension Improving.  Currently not at goal. Currently on losartan. Will start Toprol-XL 12.5 mg p.o. a.m. Reemphasized importance of low-salt diet. Educated him on keeping a log of his blood pressures at home  Type 2 diabetes mellitus with hyperglycemia, with long-term current use of insulin (HCC) A1c not well-controlled. Patient states that he is currently on insulin and following up with endocrinology. Start atorvastatin 40 mg p.o. nightly, targeting goal LDL of <55 mg/dL. Will check fasting lipid profile and CMP in 6 weeks to reevaluate therapy  Atherosclerosis of aorta (HCC) /ascending aorta dilatation: Start aspirin and atorvastatin.  Will need to follow the dilatation of the ascending aorta.   Patient is scheduled for a coronary calcium score later this month.  Given his uncontrolled diabetes, uncontrolled hyperlipidemia, shortness of breath shared decision was to proceed with ischemic  workup.  Will schedule for exercise nuclear stress test to evaluate for functional capacity as well as reversible ischemia.  FINAL MEDICATION LIST END OF ENCOUNTER: Meds ordered this encounter  Medications   atorvastatin (LIPITOR) 40 MG tablet    Sig: Take 1 tablet (40 mg total) by mouth at bedtime.    Dispense:  90 tablet    Refill:  0   metoprolol succinate (TOPROL XL) 25 MG 24 hr tablet    Sig: Take 0.5 tablets (12.5 mg total) by mouth every morning.    Dispense:  45 tablet    Refill:  0   aspirin EC 81 MG tablet    Sig: Take 1 tablet (81 mg total) by mouth daily. Swallow whole.    Dispense:  30 tablet    Refill:  12    Medications Discontinued During This Encounter  Medication Reason   metoprolol succinate (TOPROL XL) 25 MG 24 hr tablet Patient Preference     Current Outpatient Medications:    aspirin EC 81 MG tablet, Take 1 tablet (81 mg total) by mouth daily. Swallow whole., Disp: 30 tablet, Rfl: 12   atorvastatin (LIPITOR) 40 MG tablet, Take 1 tablet (40 mg total) by mouth at bedtime., Disp: 90 tablet, Rfl: 0   indomethacin (INDOCIN) 50 MG capsule, Take 50 mg by mouth 3 (three) times daily as needed., Disp: , Rfl:    insulin lispro (HUMALOG) 100 UNIT/ML KwikPen, Inject 18 Units into the skin with breakfast, with lunch, and with evening meal., Disp: , Rfl:    Insulin NPH, Human,, Isophane, (HUMULIN N KWIKPEN) 100 UNIT/ML Kiwkpen, Inject 30 Units into the skin in the morning and at bedtime., Disp: , Rfl:    losartan (COZAAR) 50 MG tablet, Take 1 tablet (50 mg total) by mouth in the morning., Disp: 30 tablet, Rfl: 2   metFORMIN (GLUCOPHAGE) 500 MG tablet, Take 1,000 mg by mouth 2 (two) times daily with  a meal., Disp: , Rfl:    metoprolol succinate (TOPROL XL) 25 MG 24 hr tablet, Take 0.5 tablets (12.5 mg total) by mouth every morning., Disp: 45 tablet, Rfl: 0  Orders Placed This Encounter  Procedures   Lipid Panel With LDL/HDL Ratio   LDL cholesterol, direct   CMP14+EGFR    PCV MYOCARDIAL PERFUSION WO LEXISCAN   PCV ECHOCARDIOGRAM COMPLETE    Patient Instructions  Take Toprol-XL 12.5 mg p.o. every morning. Take losartan in the p.m. Start atorvastatin at night as prescribed Start aspirin 81 mg p.o. daily. Fasting labs in 6 weeks to reevaluate your lipids and liver function after starting cholesterol medication. Your coronary calcium score scheduled for 03/31/2022 at 10 AM. Exercise stress test prior to the next office visit.   --Continue cardiac medications as reconciled in final medication list. --Return in about 3 months (around 06/21/2022) for Follow up Aortic stenosis, Review test results. or sooner if needed. --Continue follow-up with your primary care physician regarding the management of your other chronic comorbid conditions.  Patient's questions and concerns were addressed to his satisfaction. He voices understanding of the instructions provided during this encounter.   This note was created using a voice recognition software as a result there may be grammatical errors inadvertently enclosed that do not reflect the nature of this encounter. Every attempt is made to correct such errors.  Rex Kras, [place], [facility]  Pager: [phone] Office: [phone]

## 2022-03-31 ENCOUNTER — Other Ambulatory Visit: Payer: 59

## 2022-04-12 ENCOUNTER — Ambulatory Visit: Payer: 59

## 2022-04-12 DIAGNOSIS — I700 Atherosclerosis of aorta: Secondary | ICD-10-CM

## 2022-04-12 DIAGNOSIS — I10 Essential (primary) hypertension: Secondary | ICD-10-CM

## 2022-04-12 DIAGNOSIS — E1165 Type 2 diabetes mellitus with hyperglycemia: Secondary | ICD-10-CM

## 2022-04-12 DIAGNOSIS — I350 Nonrheumatic aortic (valve) stenosis: Secondary | ICD-10-CM

## 2022-04-12 DIAGNOSIS — R0602 Shortness of breath: Secondary | ICD-10-CM

## 2022-04-19 NOTE — Progress Notes (Signed)
Left VM to call back to move appt up.

## 2022-04-20 ENCOUNTER — Telehealth: Payer: Self-pay

## 2022-04-20 NOTE — Telephone Encounter (Signed)
Tried to call patients wife, phone just rings busy. Tried to call patients work number and was told he was not working today.

## 2022-04-20 NOTE — Telephone Encounter (Signed)
-----  Message from Matthew Saras, LPN sent at 2/59/5638  8:35 AM EST ----- Left VM to call back to move appt up.

## 2022-04-20 NOTE — Telephone Encounter (Addendum)
-----  Message from Matthew Saras, LPN sent at 4/40/1027  8:35 AM EST ----- Left VM to call back to move appt up.

## 2022-04-21 ENCOUNTER — Telehealth: Payer: Self-pay

## 2022-04-21 NOTE — Telephone Encounter (Signed)
Spoke with patient about stress test, and got his follow up rescheduled for Friday 04/23/22.

## 2022-04-23 ENCOUNTER — Ambulatory Visit: Payer: 59 | Admitting: Cardiology

## 2022-04-23 ENCOUNTER — Encounter: Payer: Self-pay | Admitting: Cardiology

## 2022-04-23 VITALS — BP 156/99 | HR 103 | Ht 65.0 in | Wt 219.0 lb

## 2022-04-23 DIAGNOSIS — R0602 Shortness of breath: Secondary | ICD-10-CM

## 2022-04-23 DIAGNOSIS — I700 Atherosclerosis of aorta: Secondary | ICD-10-CM

## 2022-04-23 DIAGNOSIS — I10 Essential (primary) hypertension: Secondary | ICD-10-CM

## 2022-04-23 DIAGNOSIS — R9439 Abnormal result of other cardiovascular function study: Secondary | ICD-10-CM

## 2022-04-23 DIAGNOSIS — I77810 Thoracic aortic ectasia: Secondary | ICD-10-CM

## 2022-04-23 DIAGNOSIS — E1165 Type 2 diabetes mellitus with hyperglycemia: Secondary | ICD-10-CM

## 2022-04-23 DIAGNOSIS — I350 Nonrheumatic aortic (valve) stenosis: Secondary | ICD-10-CM

## 2022-04-23 MED ORDER — LOSARTAN POTASSIUM 50 MG PO TABS: 50.0000 mg | ORAL_TABLET | Freq: Every morning | ORAL | 2 refills | Status: DC

## 2022-04-23 MED ORDER — METOPROLOL SUCCINATE ER 25 MG PO TB24: 12.5000 mg | ORAL_TABLET | Freq: Every morning | ORAL | 0 refills | Status: DC

## 2022-04-23 NOTE — Progress Notes (Signed)
ID:  Jonathon Bray, DOB September 17, 1965, MRN [id number]  PCP:  Glenda Chroman, MD  Cardiologist:  Rex Kras, DO, [facility] (established care 01/18/2022)  Date: 04/23/22 Last Office Visit: 03/22/2022  Chief Complaint  Patient presents with   Aortic Stenosis   Follow-up    Test results    HPI  Jonathon Bray is a 57 y.o. 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   Initially referred to the practice for shortness of breath with concern for possible heart failure.  However on physical examination his symptoms were likely due to valvular heart disease.  Echocardiogram performed which confirmed at least moderate aortic stenosis.  Patient was started on losartan and low-dose beta-blocker therapy.  And his symptoms overall improved by 90%.  However given his cardiovascular risk factors including insulin-dependent diabetes, aortic atherosclerosis, and valvular heart disease shared decision was to proceed with stress test.  Patient underwent exercise nuclear stress test during which he exercised for approximately 4 minutes and 14 seconds achieved 6.12 METS, and was noted to have PVCs at peak exercise.  He also has a reversible perfusion defect suggestive of ischemia in either the RCA/LCx distribution.  Currently patient denies any anginal discomfort.  Patient is requesting refills on his medications that he is ran out.  FUNCTIONAL STATUS: No structured exercise program or daily routine.  Limited due o prior surgery.  ALLERGIES: Allergies  Allergen Reactions   Canagliflozin Other (See Comments)    Other reaction(s): Dizziness "passing out" "passing out" "passing out" "passing out"    Levofloxacin Rash   Lisinopril  Other (See Comments) and Cough    Ok with low dose    MEDICATION LIST PRIOR TO VISIT: Current Meds  Medication Sig   aspirin EC 81 MG tablet Take 1 tablet (81 mg total) by mouth daily. Swallow whole.   atorvastatin (LIPITOR) 40 MG tablet Take 1 tablet (40 mg total) by mouth at bedtime.   indomethacin (INDOCIN) 50 MG capsule Take 50 mg by mouth 3 (three) times daily as needed.   insulin lispro (HUMALOG) 100 UNIT/ML KwikPen Inject 18 Units into the skin with breakfast, with lunch, and with evening meal.   Insulin NPH, Human,, Isophane, (HUMULIN N KWIKPEN) 100 UNIT/ML Kiwkpen Inject 30 Units into the skin in the morning and at bedtime.   metFORMIN (GLUCOPHAGE) 500 MG tablet Take 1,000 mg by mouth 2 (two) times daily with a meal.     PAST MEDICAL HISTORY: Past Medical History:  Diagnosis Date   Aortic stenosis    Diabetes mellitus without complication ([place])    type two - [place] ON RIGHT UPPER OUTER ARM   Hypertension    PONV (postoperative nausea and vomiting)     PAST SURGICAL HISTORY: Past Surgical History:  Procedure Laterality Date   COLONOSCOPY  2020   KYPHOPLASTY N/A 06/19/2020   Procedure: ATTEMPTED LUMBAR TWO KYPHOPLASTY;  Surgeon: Ashok Pall, MD;  Location: [place];  Service: Neurosurgery;  Laterality: N/A;    FAMILY HISTORY: The patient family history includes Diabetes in his brother, father, and mother; Heart attack in his brother and sister; Heart disease in his father; Hypertension in his sister; Other (age of onset: 35) in his brother; Other (age of onset: 27) in his brother.  SOCIAL HISTORY:  The patient  reports  that he has never smoked. He has never used smokeless tobacco. He reports that he does not drink alcohol and does not use drugs.  REVIEW OF SYSTEMS: Review of Systems  Cardiovascular:  Negative for chest pain, claudication, dyspnea on exertion, irregular heartbeat, leg swelling, near-syncope, orthopnea, palpitations, paroxysmal nocturnal dyspnea and  syncope.  Respiratory:  Negative for shortness of breath.   Hematologic/Lymphatic: Negative for bleeding problem.  Musculoskeletal:  Negative for muscle cramps and myalgias.  Neurological:  Negative for dizziness and light-headedness.    PHYSICAL EXAM:    04/23/2022    9:18 AM 03/22/2022    3:47 PM 01/18/2022    3:32 PM  Vitals with BMI  Height '5\' 5"'$  '5\' 5"'$    Weight 219 lbs 214 lbs 13 oz   BMI 39.53 20.23   Systolic 343 568 616  Diastolic 99 88 837  Pulse 103 86 88   CONSTITUTIONAL: Appears older than stated age, hemodynamically stable, no acute distress  SKIN: Skin is warm and dry. No rash noted. No cyanosis. No pallor. No jaundice HEAD: Normocephalic and atraumatic.  EYES: No scleral icterus MOUTH/THROAT: Moist oral membranes.  Poor oral dentition NECK: No JVD present. No thyromegaly noted.  Bilateral carotid bruits, left subclavian bruit. CHEST Normal respiratory effort. No intercostal retractions  LUNGS: Decreased breath sounds bilaterally likely secondary to poor inspiratory effort.  No stridor. No wheezes. No rales.  CARDIOVASCULAR: Regular rate and rhythm, positive S1-S2, 3 out of 6 systolic ejection murmur at the second right intercostal space,  no rubs or gallops appreciated. ABDOMINAL: Obese, soft, nontender, nondistended, positive bowel sounds in all 4 quadrants no apparent ascites.  EXTREMITIES: +trace bilateral peripheral edema, warm to touch HEMATOLOGIC: No significant bruising NEUROLOGIC: Oriented to person, place, and time. Nonfocal. Normal muscle tone.  PSYCHIATRIC: Normal mood and affect. Normal behavior. Cooperative  CARDIAC DATABASE: EKG: 01/18/2022: Normal sinus rhythm, 93 bpm, without underlying ischemia or injury pattern.  Echocardiogram: 01/25/2014: LVEF 60%.    02/16/2022: 1. Left ventricular ejection fraction, by estimation, is 60 to 65%. Left ventricular ejection fraction by PLAX is 70 %. The left ventricle has normal function. The left ventricle  has no regional wall motion abnormalities. The left ventricular internal cavity size was mildly dilated. Left ventricular diastolic parameters were normal. 2. Right ventricular systolic function is normal. The right ventricular size is normal. 3. The mitral valve is grossly normal. No evidence of mitral valve regurgitation. 4. The aortic valve is calcified. There is mild thickening of the aortic valve. Aortic valve regurgitation is mild. Moderate aortic valve stenosis. Aortic valve mean gradient measures 32.0 mmHg. Aortic valve Vmax measures 3.56 m/s.   Stress Testing: Exercise Sestamibi stress test 04/12/2022: Exercise nuclear stress test was performed using Bruce protocol.  1 Day Rest and Stress images. Exercise time 4 minutes 14 seconds, achieved 6.12METS, 86% APMHR.  Stress ECG negative for ischemia with rare PVCs at peak stress.  Hypertensive response to exercise.  Medium size, mild intensity reversible perfusion defect, involving base to mid inferior and basal inferolateral segments, suggestive of ischemia in RCA/LCX distribution.  Left ventricular size normal, calculated LVEF 50% but visually appears preserved. No prior studies available for comparison. Low risk study, clinical correlation required (reduced functional capacity for age, hypertensive response to exercise, PVC at peak stress, perfusion findings as noted above).   Heart Catheterization: None  Carotid Duplex 02/16/2022: Right Carotid: The extracranial vessels were near-normal with only minimal wall thickening or plaque.  Left Carotid: The extracranial vessels were near-normal  with only minimal wall thickening or plaque.  Vertebrals: Bilateral vertebral arteries demonstrate antegrade flow.  Subclavians: Normal flow hemodynamics were seen in bilateral subclavian arteries.   LABORATORY DATA:    Latest Ref Rng & Units 12/26/2020    2:07 PM 06/19/2020    1:51 PM 01/25/2014    8:20 AM  CBC  WBC 4.0 - 10.5 K/uL 11.9  9.3   7.8   Hemoglobin 13.0 - 17.0 g/dL 15.9  17.5  17.8   Hematocrit 39.0 - 52.0 % 45.0  49.0  49.4   Platelets 150 - 400 K/uL 257  233  168        Latest Ref Rng & Units 02/22/2022   10:04 AM 12/26/2020    2:07 PM 06/19/2020    1:51 PM  CMP  Glucose 70 - 99 mg/dL 352  158  171   BUN 6 - 24 mg/dL '18  18  15   '$ Creatinine 0.76 - 1.27 mg/dL 0.95  0.90  0.88   Sodium 134 - 144 mmol/L 133  138  133   Potassium 3.5 - 5.2 mmol/L 5.3  4.3  4.4   Chloride 96 - 106 mmol/L 95  102  99   CO2 20 - 29 mmol/L '26  28  27   '$ Calcium 8.7 - 10.2 mg/dL 9.9  9.4  9.3   Total Protein 6.0 - 8.5 g/dL 7.0     Total Bilirubin 0.0 - 1.2 mg/dL 0.6     Alkaline Phos 44 - 121 IU/L 73     AST 0 - 40 IU/L 12     ALT 0 - 44 IU/L 10       Lipid Panel  Lab Results  Component Value Date   CHOL 193 02/22/2022   HDL 47 02/22/2022   LDLCALC 114 (H) 02/22/2022   LDLDIRECT 119 (H) 02/22/2022   TRIG 181 (H) 02/22/2022   CHOLHDL 4.0 01/25/2014    No components found for: "NTPROBNP" Recent Labs    02/22/22 1005  PROBNP 251*   Recent Labs    02/22/22 1004  TSH 1.130    BMP Recent Labs    02/22/22 1004  NA 133*  K 5.3*  CL 95*  CO2 26  GLUCOSE 352*  BUN 18  CREATININE 0.95  CALCIUM 9.9    HEMOGLOBIN A1C Lab Results  Component Value Date   HGBA1C 9.3 (H) 12/26/2020   MPG 220.21 12/26/2020    IMPRESSION:    ICD-10-CM   1. Abnormal stress test  R94.39 CBC    Basic metabolic panel    2. Shortness of breath  R06.02 losartan (COZAAR) 50 MG tablet    CBC    Basic metabolic panel    3. Nonrheumatic aortic valve stenosis  I35.0 metoprolol succinate (TOPROL XL) 25 MG 24 hr tablet    4. Essential hypertension  I10 losartan (COZAAR) 50 MG tablet    metoprolol succinate (TOPROL XL) 25 MG 24 hr tablet    5. Type 2 diabetes mellitus with hyperglycemia, with long-term current use of insulin (HCC)  E11.65    Z79.4     6. Atherosclerosis of aorta (HCC)  I70.0     7. Ascending aorta dilatation  [facility])  I77.810        RECOMMENDATIONS: Ademola Vert is a 57 y.o. 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   Abnormal stress test Shortness of breath Given his dyspnea, multiple cardiovascular risk factors including insulin-dependent diabetes, family history of heart disease, and elevated risk of 10-year ASCVD he underwent ischemic workup with exercise nuclear stress test.  Nuclear stress test illustrated reduced functional capacity for age, hypertensive response to exercise, PVCs at peak stress, and medium size mild intensity reversible perfusion defect involving the basal to mid inferior and basal inferolateral segments suggestive of possible ischemia in either RCA/LCx distribution. Given the workup and symptoms recommend further workup with either coronary CTA versus invasive angiography.  After discussing the risks, benefits, alternatives patient would like to proceed with left heart catheterization with possible intervention if cost effective (he wants to know his out-of-pocket expense).  Refilled his Toprol-XL and losartan for now.  Nonrheumatic aortic valve stenosis Echo November 2023 illustrates moderate aortic stenosis. Denies angina pectoralis, heart failure, syncope. Monitor for now  Essential hypertension Office blood pressures are not well-controlled. He is ran out of his medications-refilled losartan and metoprolol. I have asked him to keep a log of his blood pressures and bring it in the next office visit or review with PCP.  Hesitant to uptitrate antihypertensive medications because he has had episodes of dizziness in the past.  Type 2 diabetes mellitus with hyperglycemia, with long-term current use of  insulin (HCC) We emphasized the importance of glycemic control. Currently on ARB, statin therapy. Recommend initiation of Farxiga/Jardiance will defer to PCP for now. 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 Currently being followed by PCP   FINAL MEDICATION LIST END OF ENCOUNTER: Meds ordered this encounter  Medications   losartan (COZAAR) 50 MG tablet    Sig: Take 1 tablet (50 mg total) by mouth in the morning.    Dispense:  30 tablet    Refill:  2   metoprolol succinate (TOPROL XL) 25 MG 24 hr tablet    Sig: Take 0.5 tablets (12.5 mg total) by mouth every morning for 90 doses.    Dispense:  45 tablet    Refill:  0    Medications Discontinued During This Encounter  Medication Reason   losartan (COZAAR) 50 MG tablet Reorder   metoprolol succinate (TOPROL XL) 25 MG 24 hr tablet Reorder     Current Outpatient Medications:    aspirin EC 81 MG tablet, Take 1 tablet (81 mg total) by mouth daily. Swallow whole., Disp: 30 tablet, Rfl: 12   atorvastatin (LIPITOR) 40 MG tablet, Take 1 tablet (40 mg total) by mouth at bedtime., Disp: 90 tablet, Rfl: 0   indomethacin (INDOCIN) 50 MG capsule, Take 50 mg by mouth 3 (three) times daily as needed., Disp: , Rfl:    insulin lispro (HUMALOG) 100 UNIT/ML KwikPen, Inject 18 Units into the skin with breakfast, with lunch, and with evening meal., Disp: , Rfl:    Insulin NPH, Human,, Isophane, (HUMULIN N KWIKPEN) 100 UNIT/ML Kiwkpen, Inject 30 Units into the skin in the morning and at bedtime., Disp: , Rfl:    metFORMIN (GLUCOPHAGE) 500 MG tablet, Take 1,000 mg by mouth 2 (two) times daily with a meal., Disp: , Rfl:    losartan (COZAAR) 50 MG tablet, Take 1 tablet (50 mg total) by mouth in the  morning., Disp: 30 tablet, Rfl: 2   metoprolol succinate (TOPROL XL) 25 MG 24 hr tablet, Take 0.5 tablets (12.5 mg total) by mouth every morning for 90 doses., Disp: 45 tablet, Rfl: 0  Orders Placed This Encounter  Procedures   CBC   Basic metabolic panel    There are no Patient Instructions on file for this visit.   --Continue cardiac medications as reconciled in final medication list. --Return in about 4 weeks (around 05/21/2022) for Follow up s/p cath. or sooner if needed. --Continue follow-up with your primary care physician regarding the management of your other chronic comorbid conditions.  Patient's questions and concerns were addressed to his satisfaction. He voices understanding of the instructions provided during this encounter.   This note was created using a voice recognition software as a result there may be grammatical errors inadvertently enclosed that do not reflect the nature of this encounter. Every attempt is made to correct such errors.  Rex Kras, [place], [facility]  Pager: [phone] Office: [phone]

## 2022-04-28 ENCOUNTER — Telehealth: Payer: Self-pay | Admitting: Neurology

## 2022-04-28 ENCOUNTER — Encounter: Payer: Self-pay | Admitting: Neurology

## 2022-04-28 ENCOUNTER — Ambulatory Visit: Payer: 59 | Admitting: Neurology

## 2022-04-28 VITALS — BP 144/94 | HR 96 | Ht 65.0 in | Wt 208.5 lb

## 2022-04-28 DIAGNOSIS — M4306 Spondylolysis, lumbar region: Secondary | ICD-10-CM | POA: Diagnosis not present

## 2022-04-28 DIAGNOSIS — Z9889 Other specified postprocedural states: Secondary | ICD-10-CM | POA: Diagnosis not present

## 2022-04-28 DIAGNOSIS — M5440 Lumbago with sciatica, unspecified side: Secondary | ICD-10-CM

## 2022-04-28 DIAGNOSIS — G629 Polyneuropathy, unspecified: Secondary | ICD-10-CM | POA: Diagnosis not present

## 2022-04-28 MED ORDER — PREGABALIN 50 MG PO CAPS: 50.0000 mg | ORAL_CAPSULE | Freq: Three times a day (TID) | ORAL | 3 refills | Status: DC

## 2022-04-28 NOTE — Telephone Encounter (Signed)
Pt is requesting a summary of what he will owe from today's visit mailed to him. Informed pt that we have to wait for insurance to go through before summary is ready. Verified address on file is correct.

## 2022-04-28 NOTE — Patient Instructions (Signed)
MRI lumbar spine without contrast Trial of Lyrica 50 mg twice daily Continue to follow-up PCP Return in 6 months or sooner if worse.
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
## 2022-05-03 NOTE — Telephone Encounter (Signed)
Peer to peer was performed with his insurance company earlier this afternoon.  After discussion with Dr. Cletus Gash approval number is [id number] , CPT code [phone], valid until 3/14/204.   Kirsi Hugh Percival, DO, [facility]

## 2022-05-09 ENCOUNTER — Encounter (HOSPITAL_COMMUNITY): Payer: Self-pay

## 2022-05-10 ENCOUNTER — Observation Stay (HOSPITAL_COMMUNITY)
Admission: EM | Admit: 2022-05-10 | Discharge: 2022-05-12 | Disposition: A | Discharge status: Home / Self Care | Payer: 59 | Source: Other Acute Inpatient Hospital | Attending: Cardiology | Admitting: Cardiology

## 2022-05-10 ENCOUNTER — Encounter (HOSPITAL_COMMUNITY)
Admission: EM | Disposition: A | Discharge status: Home / Self Care | Payer: Self-pay | Source: Other Acute Inpatient Hospital | Attending: Cardiology

## 2022-05-10 ENCOUNTER — Other Ambulatory Visit: Payer: Self-pay

## 2022-05-10 ENCOUNTER — Encounter (HOSPITAL_COMMUNITY): Payer: Self-pay | Admitting: Cardiology

## 2022-05-10 ENCOUNTER — Ambulatory Visit (HOSPITAL_COMMUNITY): Admit: 2022-05-10 | Payer: 59 | Admitting: Cardiology

## 2022-05-10 DIAGNOSIS — I214 Non-ST elevation (NSTEMI) myocardial infarction: Secondary | ICD-10-CM | POA: Diagnosis not present

## 2022-05-10 DIAGNOSIS — I700 Atherosclerosis of aorta: Secondary | ICD-10-CM

## 2022-05-10 DIAGNOSIS — I409 Acute myocarditis, unspecified: Secondary | ICD-10-CM | POA: Diagnosis present

## 2022-05-10 DIAGNOSIS — E7800 Pure hypercholesterolemia, unspecified: Secondary | ICD-10-CM | POA: Diagnosis not present

## 2022-05-10 DIAGNOSIS — I77819 Aortic ectasia, unspecified site: Secondary | ICD-10-CM

## 2022-05-10 DIAGNOSIS — E1165 Type 2 diabetes mellitus with hyperglycemia: Secondary | ICD-10-CM | POA: Insufficient documentation

## 2022-05-10 DIAGNOSIS — E118 Type 2 diabetes mellitus with unspecified complications: Secondary | ICD-10-CM

## 2022-05-10 DIAGNOSIS — I401 Isolated myocarditis: Secondary | ICD-10-CM

## 2022-05-10 DIAGNOSIS — I2510 Atherosclerotic heart disease of native coronary artery without angina pectoris: Secondary | ICD-10-CM | POA: Insufficient documentation

## 2022-05-10 DIAGNOSIS — Z8249 Family history of ischemic heart disease and other diseases of the circulatory system: Secondary | ICD-10-CM

## 2022-05-10 DIAGNOSIS — Z794 Long term (current) use of insulin: Secondary | ICD-10-CM | POA: Diagnosis not present

## 2022-05-10 DIAGNOSIS — I10 Essential (primary) hypertension: Secondary | ICD-10-CM | POA: Insufficient documentation

## 2022-05-10 DIAGNOSIS — I350 Nonrheumatic aortic (valve) stenosis: Secondary | ICD-10-CM

## 2022-05-10 DIAGNOSIS — E1169 Type 2 diabetes mellitus with other specified complication: Secondary | ICD-10-CM

## 2022-05-10 DIAGNOSIS — R748 Abnormal levels of other serum enzymes: Secondary | ICD-10-CM

## 2022-05-10 HISTORY — PX: CORONARY ANGIOGRAPHY: CATH118303

## 2022-05-10 LAB — GLUCOSE, CAPILLARY
Glucose-Capillary: 139 mg/dL — ABNORMAL HIGH (ref 70–99)
Glucose-Capillary: 168 mg/dL — ABNORMAL HIGH (ref 70–99)
Glucose-Capillary: 228 mg/dL — ABNORMAL HIGH (ref 70–99)
Glucose-Capillary: 434 mg/dL — ABNORMAL HIGH (ref 70–99)

## 2022-05-10 LAB — CBC
HCT: 42.9 % (ref 39.0–52.0)
Hemoglobin: 15.2 g/dL (ref 13.0–17.0)
MCH: 29.4 pg (ref 26.0–34.0)
MCHC: 35.4 g/dL (ref 30.0–36.0)
MCV: 83 fL (ref 80.0–100.0)
Platelets: 199 10*3/uL (ref 150–400)
RBC: 5.17 MIL/uL (ref 4.22–5.81)
RDW: 12.4 % (ref 11.5–15.5)
WBC: 12.8 10*3/uL — ABNORMAL HIGH (ref 4.0–10.5)
nRBC: 0 % (ref 0.0–0.2)

## 2022-05-10 LAB — COMPREHENSIVE METABOLIC PANEL
ALT: 21 U/L (ref 0–44)
AST: 43 U/L — ABNORMAL HIGH (ref 15–41)
Albumin: 3.1 g/dL — ABNORMAL LOW (ref 3.5–5.0)
Alkaline Phosphatase: 49 U/L (ref 38–126)
Anion gap: 7 (ref 5–15)
BUN: 14 mg/dL (ref 6–20)
CO2: 27 mmol/L (ref 22–32)
Calcium: 8.8 mg/dL — ABNORMAL LOW (ref 8.9–10.3)
Chloride: 99 mmol/L (ref 98–111)
Creatinine, Ser: 0.77 mg/dL (ref 0.61–1.24)
GFR, Estimated: >60 mL/min (ref >60)
Glucose, Bld: 137 mg/dL — ABNORMAL HIGH (ref 70–99)
Potassium: 3.7 mmol/L (ref 3.5–5.1)
Sodium: 133 mmol/L — ABNORMAL LOW (ref 135–145)
Total Bilirubin: 0.7 mg/dL (ref 0.3–1.2)
Total Protein: 6.1 g/dL — ABNORMAL LOW (ref 6.5–8.1)

## 2022-05-10 LAB — HEMOGLOBIN A1C
Hgb A1c MFr Bld: 9.4 % — ABNORMAL HIGH (ref 4.8–5.6)
Mean Plasma Glucose: 223.08 mg/dL

## 2022-05-10 LAB — TROPONIN I (HIGH SENSITIVITY): Troponin I (High Sensitivity): 6077 ng/L (ref <18)

## 2022-05-10 LAB — LIPID PANEL
Cholesterol: 116 mg/dL (ref 0–200)
HDL: 45 mg/dL (ref >40)
LDL Cholesterol: 58 mg/dL (ref 0–99)
Total CHOL/HDL Ratio: 2.6 RATIO
Triglycerides: 63 mg/dL (ref <150)
VLDL: 13 mg/dL (ref 0–40)

## 2022-05-10 LAB — PROTIME-INR
INR: 1.1 (ref 0.8–1.2)
Prothrombin Time: 14.2 seconds (ref 11.4–15.2)

## 2022-05-10 LAB — APTT: aPTT: 109 seconds — ABNORMAL HIGH (ref 24–36)

## 2022-05-10 SURGERY — CORONARY ANGIOGRAPHY (CATH LAB)
Anesthesia: LOCAL

## 2022-05-10 MED ORDER — ACETAMINOPHEN 325 MG PO TABS: 650.0000 mg | ORAL_TABLET | ORAL | Status: DC | PRN

## 2022-05-10 MED ORDER — HEPARIN SODIUM (PORCINE) 1000 UNIT/ML IJ SOLN
INTRAMUSCULAR | Status: AC
  Filled 2022-05-10: qty 10

## 2022-05-10 MED ORDER — SODIUM CHLORIDE 0.9 % WEIGHT BASED INFUSION
1.0000 mL/kg/h | INTRAVENOUS | Status: AC
  Administered 2022-05-10: 1 mL/kg/h via INTRAVENOUS

## 2022-05-10 MED ORDER — MIDAZOLAM HCL 2 MG/2ML IJ SOLN
INTRAMUSCULAR | Status: AC
  Filled 2022-05-10: qty 2

## 2022-05-10 MED ORDER — PREGABALIN 25 MG PO CAPS
50.0000 mg | ORAL_CAPSULE | Freq: Two times a day (BID) | ORAL | Status: DC
  Administered 2022-05-10 – 2022-05-12 (×4): 50 mg via ORAL
  Filled 2022-05-10 (×4): qty 2

## 2022-05-10 MED ORDER — SODIUM CHLORIDE 0.9 % WEIGHT BASED INFUSION
1.0000 mL/kg/h | INTRAVENOUS | Status: DC
  Administered 2022-05-10: 1 mL/kg/h via INTRAVENOUS

## 2022-05-10 MED ORDER — HYDROCHLOROTHIAZIDE 25 MG PO TABS
25.0000 mg | ORAL_TABLET | Freq: Every day | ORAL | Status: DC
  Administered 2022-05-11 – 2022-05-12 (×2): 25 mg via ORAL
  Filled 2022-05-10 (×2): qty 1

## 2022-05-10 MED ORDER — LABETALOL HCL 5 MG/ML IV SOLN
INTRAVENOUS | Status: DC | PRN
  Administered 2022-05-10: 15 mg via INTRAVENOUS

## 2022-05-10 MED ORDER — INSULIN ASPART 100 UNIT/ML IJ SOLN
4.0000 [IU] | Freq: Three times a day (TID) | INTRAMUSCULAR | Status: DC
  Administered 2022-05-10 – 2022-05-12 (×5): 4 [IU] via SUBCUTANEOUS

## 2022-05-10 MED ORDER — FENTANYL CITRATE (PF) 100 MCG/2ML IJ SOLN
INTRAMUSCULAR | Status: DC | PRN
  Administered 2022-05-10: 25 ug via INTRAVENOUS

## 2022-05-10 MED ORDER — ATORVASTATIN CALCIUM 40 MG PO TABS
40.0000 mg | ORAL_TABLET | Freq: Every morning | ORAL | Status: DC
  Administered 2022-05-11 – 2022-05-12 (×2): 40 mg via ORAL
  Filled 2022-05-10 (×2): qty 1

## 2022-05-10 MED ORDER — SODIUM CHLORIDE 0.9% FLUSH
3.0000 mL | Freq: Two times a day (BID) | INTRAVENOUS | Status: DC
  Administered 2022-05-11 (×2): 3 mL via INTRAVENOUS

## 2022-05-10 MED ORDER — LIDOCAINE HCL (PF) 1 % IJ SOLN
INTRAMUSCULAR | Status: DC | PRN
  Administered 2022-05-10: 2 mL

## 2022-05-10 MED ORDER — SODIUM CHLORIDE 0.9 % IV SOLN: 250.0000 mL | INTRAVENOUS | Status: DC | PRN

## 2022-05-10 MED ORDER — INSULIN ASPART 100 UNIT/ML IJ SOLN: 16.0000 [IU] | Freq: Two times a day (BID) | INTRAMUSCULAR | Status: DC

## 2022-05-10 MED ORDER — LOSARTAN POTASSIUM 50 MG PO TABS
50.0000 mg | ORAL_TABLET | Freq: Every evening | ORAL | Status: DC
  Filled 2022-05-10 (×2): qty 1

## 2022-05-10 MED ORDER — LABETALOL HCL 5 MG/ML IV SOLN
INTRAVENOUS | Status: AC
  Filled 2022-05-10: qty 4

## 2022-05-10 MED ORDER — INSULIN NPH (HUMAN) (ISOPHANE) 100 UNIT/ML ~~LOC~~ SUSP
30.0000 [IU] | Freq: Two times a day (BID) | SUBCUTANEOUS | Status: DC
  Administered 2022-05-10 – 2022-05-12 (×4): 30 [IU] via SUBCUTANEOUS
  Filled 2022-05-10 (×3): qty 10

## 2022-05-10 MED ORDER — ASPIRIN 81 MG PO CHEW: 81.0000 mg | CHEWABLE_TABLET | ORAL | Status: DC

## 2022-05-10 MED ORDER — VERAPAMIL HCL 2.5 MG/ML IV SOLN
INTRAVENOUS | Status: AC
  Filled 2022-05-10: qty 2

## 2022-05-10 MED ORDER — AMLODIPINE BESYLATE 5 MG PO TABS
5.0000 mg | ORAL_TABLET | Freq: Every day | ORAL | Status: DC
  Administered 2022-05-10 – 2022-05-11 (×2): 5 mg via ORAL
  Filled 2022-05-10 (×2): qty 1

## 2022-05-10 MED ORDER — INSULIN ASPART 100 UNIT/ML IJ SOLN
0.0000 [IU] | Freq: Three times a day (TID) | INTRAMUSCULAR | Status: DC
  Administered 2022-05-10: 3 [IU] via SUBCUTANEOUS
  Administered 2022-05-11: 7 [IU] via SUBCUTANEOUS
  Administered 2022-05-11: 11 [IU] via SUBCUTANEOUS
  Administered 2022-05-11 – 2022-05-12 (×2): 4 [IU] via SUBCUTANEOUS

## 2022-05-10 MED ORDER — LIDOCAINE HCL (PF) 1 % IJ SOLN
INTRAMUSCULAR | Status: AC
  Filled 2022-05-10: qty 30

## 2022-05-10 MED ORDER — HEPARIN SODIUM (PORCINE) 1000 UNIT/ML IJ SOLN
INTRAMUSCULAR | Status: DC | PRN
  Administered 2022-05-10: 8000 [IU] via INTRAVENOUS

## 2022-05-10 MED ORDER — FENTANYL CITRATE (PF) 100 MCG/2ML IJ SOLN
INTRAMUSCULAR | Status: AC
  Filled 2022-05-10: qty 2

## 2022-05-10 MED ORDER — SODIUM CHLORIDE 0.9% FLUSH
3.0000 mL | Freq: Two times a day (BID) | INTRAVENOUS | Status: DC
  Administered 2022-05-10: 3 mL via INTRAVENOUS

## 2022-05-10 MED ORDER — SODIUM CHLORIDE 0.9% FLUSH: 3.0000 mL | INTRAVENOUS | Status: DC | PRN

## 2022-05-10 MED ORDER — MIDAZOLAM HCL 2 MG/2ML IJ SOLN
INTRAMUSCULAR | Status: DC | PRN
  Administered 2022-05-10: 2 mg via INTRAVENOUS

## 2022-05-10 MED ORDER — SODIUM CHLORIDE 0.9 % WEIGHT BASED INFUSION
3.0000 mL/kg/h | INTRAVENOUS | Status: DC
  Administered 2022-05-10: 3 mL/kg/h via INTRAVENOUS

## 2022-05-10 MED ORDER — HEPARIN (PORCINE) IN NACL 1000-0.9 UT/500ML-% IV SOLN
INTRAVENOUS | Status: AC
  Filled 2022-05-10: qty 1000

## 2022-05-10 MED ORDER — ONDANSETRON HCL 4 MG/2ML IJ SOLN: 4.0000 mg | Freq: Four times a day (QID) | INTRAMUSCULAR | Status: DC | PRN

## 2022-05-10 MED ORDER — IOHEXOL 350 MG/ML SOLN
INTRAVENOUS | Status: DC | PRN
  Administered 2022-05-10: 55 mL

## 2022-05-10 MED ORDER — HEPARIN (PORCINE) IN NACL 1000-0.9 UT/500ML-% IV SOLN
INTRAVENOUS | Status: DC | PRN
  Administered 2022-05-10 (×2): 500 mL

## 2022-05-10 MED ORDER — VERAPAMIL HCL 2.5 MG/ML IV SOLN
INTRAVENOUS | Status: DC | PRN
  Administered 2022-05-10: 10 mL via INTRA_ARTERIAL

## 2022-05-10 MED ORDER — METOPROLOL SUCCINATE ER 50 MG PO TB24
50.0000 mg | ORAL_TABLET | Freq: Every day | ORAL | Status: DC
  Administered 2022-05-10 – 2022-05-11 (×2): 50 mg via ORAL
  Filled 2022-05-10 (×2): qty 1

## 2022-05-10 SURGICAL SUPPLY — 15 items
CATH 5FR JL3.5 JR4 ANG PIG MP (CATHETERS) IMPLANT
CATH INFINITI 5FR AL1 (CATHETERS) IMPLANT
CATH INFINITI 5FR JK (CATHETERS) IMPLANT
DEVICE RAD COMP TR BAND LRG (VASCULAR PRODUCTS) IMPLANT
GLIDESHEATH SLEND A-KIT 6F 22G (SHEATH) IMPLANT
GLIDEWIRE ADV .035X180CM (WIRE) IMPLANT
GUIDEWIRE INQWIRE 1.5J.035X260 (WIRE) IMPLANT
INQWIRE 1.5J .035X260CM (WIRE) ×1
KIT HEART LEFT (KITS) ×1 IMPLANT
PACK CARDIAC CATHETERIZATION (CUSTOM PROCEDURE TRAY) ×1 IMPLANT
TRANSDUCER W/STOPCOCK (MISCELLANEOUS) ×1 IMPLANT
TUBING CIL FLEX 10 FLL-RA (TUBING) ×1 IMPLANT
WIRE AMPLATZ ST .035X145CM (WIRE) IMPLANT
WIRE BENTSON .035X145CM (WIRE) IMPLANT
WIRE EMERALD ST .035X260CM (WIRE) IMPLANT

## 2022-05-10 NOTE — Progress Notes (Signed)
Pt arriving from [facility] [place]  via [facility] with Heparin running at 1200units/ hr.

## 2022-05-10 NOTE — Plan of Care (Signed)
  Problem: Cardiovascular: Goal: Ability to achieve and maintain adequate cardiovascular perfusion will improve Outcome: Progressing Goal: Vascular access site(s) Level 0-1 will be maintained Outcome: Progressing   Problem: Health Behavior/Discharge Planning: Goal: Ability to safely manage health-related needs after discharge will improve Outcome: Progressing   Problem: Education: Goal: Knowledge of General Education information will improve Description: Including pain rating scale, medication(s)/side effects and non-pharmacologic comfort measures Outcome: Progressing   Problem: Pain Managment: Goal: General experience of comfort will improve Outcome: Progressing   Problem: Safety: Goal: Ability to remain free from injury will improve Outcome: Progressing   Problem: Skin Integrity: Goal: Risk for impaired skin integrity will decrease Outcome: Progressing

## 2022-05-10 NOTE — Progress Notes (Addendum)
Date and time results received: 05/10/22 1715 (use smartphrase ".now" to insert current time)  Test: troponin  Critical Value: 6.077  Name of Provider Notified: Dr. Eulas Post   Orders Received? Or Actions Taken?: Paged Dr. Einar Gip. No new orders.

## 2022-05-10 NOTE — Interval H&P Note (Signed)
History and Physical Interval Note:  05/10/2022 1:03 PM  [name]  has presented today for surgery, with the diagnosis of nstemi.  The various methods of treatment have been discussed with the patient and family. After consideration of risks, benefits and other options for treatment, the patient has consented to  Procedure(s): LEFT HEART CATH AND CORONARY ANGIOGRAPHY (N/A) and possible angioplasty as a surgical intervention.  The patient's history has been reviewed, patient examined, no change in status, stable for surgery.  I have reviewed the patient's chart and labs.  Questions were answered to the patient's satisfaction.    Cath Lab Visit (complete for each Cath Lab visit)  Clinical Evaluation Leading to the Procedure:   ACS: Yes.    Non-ACS:    Anginal Classification: CCS IV  Anti-ischemic medical therapy: Minimal Therapy (1 class of medications)  Non-Invasive Test Results: Intermediate-risk stress test findings: cardiac mortality 1-3%/year  Prior CABG: No previous CABG   Adrian Prows

## 2022-05-10 NOTE — H&P (Signed)
CARDIOLOGY ADMIT NOTE   Patient ID: Jonathon Bray MRN: [id number] DOB/AGE: 1965/10/28 57 y.o.  Admit date: 05/10/2022 Primary Physician:  Glenda Chroman, MD  Patient ID: Jonathon Bray, male    DOB: [birth year], 57 y.o.   MRN: [id number]  No chief complaint on file.  HPI:    Jonathon Bray  is a 58 y.o. Caucasian male patient with moderate aortic stenosis, mild ascending aortic dilatation at 39 mm, aortic atherosclerosis, primary hypertension, hypercholesterolemia, type 2 diabetes mellitus uncontrolled, gout, strong family history of premature coronary disease with multiple members in his family including his brothers and sister having coronary disease in the late 62s and 57 years of age and moderate obesity who was evaluated by my partner Dr. Terri Skains last seen by him on 04/23/2022 and recommended cardiac catheterization which is scheduled for tomorrow.  Patient presented to [facility] yesterday with chest pain, was found to have NSTEMI, now being transferred to [facility] for cardiac catheterization and management of NSTEMI.  Patient states yesterday around 6 evening, started having burning sensation of the chest and chest tightness and did not feel well, in view of very strong family history of premature coronary disease, presented to the hospital.  States that he is still having some mild discomfort but states that this is just lingering pain but he feels very comfortable and feels well.  No other associated symptoms, denies shortness of breath, palpitations, dizziness or syncope.  Past Medical History:  Diagnosis Date   Aortic stenosis    Diabetes mellitus without complication ([place])    type two - [place] ON RIGHT UPPER OUTER ARM   Hypertension    PONV (postoperative nausea and vomiting)    Past Surgical History:  Procedure Laterality Date   COLONOSCOPY  2020   KYPHOPLASTY N/A 06/19/2020   Procedure: ATTEMPTED LUMBAR TWO KYPHOPLASTY;  Surgeon:  Ashok Pall, MD;  Location: [place];  Service: Neurosurgery;  Laterality: N/A;   Social History   Tobacco Use   Smoking status: Never   Smokeless tobacco: Never  Substance Use Topics   Alcohol use: No    Marital status: Married   Family History  Problem Relation Age of Onset   Diabetes Mother    Diabetes Father    Heart disease Father    Heart attack Sister    Hypertension Sister    Heart attack Brother    Other Brother 67       Shot in the head   Other Brother 5       Polio   Diabetes Brother     ROS  Review of Systems  Cardiovascular:  Positive for chest pain. Negative for dyspnea on exertion and leg swelling.   Objective      05/10/2022   11:20 AM 04/28/2022    3:32 PM 04/23/2022    9:18 AM  Vitals with BMI  Height '5\' 5"'$  '5\' 5"'$  '5\' 5"'$   Weight 214 lbs 208 lbs 8 oz 219 lbs  BMI 35.61 38.4 66.59  Systolic  935 701  Diastolic  94 99  Pulse  96 103    Physical Exam Constitutional:      Appearance: He is obese.  Neck:     Vascular: Carotid bruit (bilateral) present. No JVD.  Cardiovascular:     Rate and Rhythm: Normal rate and regular rhythm.     Pulses: Intact distal pulses.     Heart sounds: Murmur heard.     Harsh midsystolic murmur  is present at the upper right sternal border radiating to the neck.     No gallop.  Pulmonary:     Effort: Pulmonary effort is normal.     Breath sounds: Normal breath sounds.  Abdominal:     General: Bowel sounds are normal.     Palpations: Abdomen is soft.  Musculoskeletal:     Right lower leg: No edema.     Left lower leg: No edema.    Laboratory examination:   Recent Labs    02/22/22 1004  NA 133*  K 5.3*  CL 95*  CO2 26  GLUCOSE 352*  BUN 18  CREATININE 0.95  CALCIUM 9.9       Latest Ref Rng & Units 02/22/2022   10:04 AM 12/26/2020    2:07 PM 06/19/2020    1:51 PM  CMP  Glucose 70 - 99 mg/dL 352  158  171   BUN 6 - 24 mg/dL '18  18  15   '$ Creatinine 0.76 - 1.27 mg/dL 0.95  0.90  0.88   Sodium 134 - 144  mmol/L 133  138  133   Potassium 3.5 - 5.2 mmol/L 5.3  4.3  4.4   Chloride 96 - 106 mmol/L 95  102  99   CO2 20 - 29 mmol/L '26  28  27   '$ Calcium 8.7 - 10.2 mg/dL 9.9  9.4  9.3   Total Protein 6.0 - 8.5 g/dL 7.0     Total Bilirubin 0.0 - 1.2 mg/dL 0.6     Alkaline Phos 44 - 121 IU/L 73     AST 0 - 40 IU/L 12     ALT 0 - 44 IU/L 10         Latest Ref Rng & Units 12/26/2020    2:07 PM 06/19/2020    1:51 PM 01/25/2014    8:20 AM  CBC  WBC 4.0 - 10.5 K/uL 11.9  9.3  7.8   Hemoglobin 13.0 - 17.0 g/dL 15.9  17.5  17.8   Hematocrit 39.0 - 52.0 % 45.0  49.0  49.4   Platelets 150 - 400 K/uL 257  233  168    Lab Results  Component Value Date   CHOL 193 02/22/2022   HDL 47 02/22/2022   LDLCALC 114 (H) 02/22/2022   LDLDIRECT 119 (H) 02/22/2022   TRIG 181 (H) 02/22/2022   CHOLHDL 4.0 01/25/2014     HEMOGLOBIN A1C Lab Results  Component Value Date   HGBA1C 9.3 (H) 12/26/2020   MPG 220.21 12/26/2020   TSH Recent Labs    02/22/22 1004  TSH 1.130     Medications and allergies   Allergies  Allergen Reactions   Invokana [Canagliflozin] Other (See Comments)     Dizziness/"passing out"    Levaquin [Levofloxacin] Rash   Zestril [Lisinopril] Other (See Comments) and Cough    Ok with low dose     sodium chloride     sodium chloride 3 mL/kg/hr (05/10/22 1139)   Followed by   sodium chloride 1 mL/kg/hr (05/10/22 1138)    Current Outpatient Medications  Medication Instructions   aspirin EC 81 mg, Oral, Daily, Swallow whole.   atorvastatin (LIPITOR) 40 mg, Oral, Daily at bedtime   HumuLIN N KwikPen 30 Units, Subcutaneous, 2 times daily   indomethacin (INDOCIN) 50 mg, Oral, Daily PRN   insulin lispro (HUMALOG) 16 Units, Subcutaneous, 2 times daily   losartan (COZAAR) 50 mg, Oral, Every morning   metFORMIN (GLUCOPHAGE-XR) 1,000  mg, Oral, 2 times daily   metoprolol succinate (TOPROL XL) 12.5 mg, Oral, Every morning   pregabalin (LYRICA) 50 mg, Oral, 3 times daily   Trulicity 3  mg, Subcutaneous, Every Fri   Radiology:   Portable chest x-ray 05/09/2022: The heart size and mediastinal contours are within normal limits.  Both lungs are clear. The visualized skeletal structures are  unremarkable.   Cardiac Studies:   Carotid Duplex 02/16/2022: Right Carotid: The extracranial vessels were near-normal with only minimal wall thickening or plaque.  Left Carotid: The extracranial vessels were near-normal with only minimal wall thickening or plaque.  Vertebrals: Bilateral vertebral arteries demonstrate antegrade flow. Subclavians: Normal flow hemodynamics were seen in bilateral subclavian arteries.   Echocardiogram 02/16/2022: 1. Left ventricular ejection fraction, by estimation, is 60 to 65%. Left ventricular ejection fraction by PLAX is 70 %. The left ventricle has normal function. The left ventricle has no regional wall motion abnormalities. The left ventricular internal cavity size was mildly dilated. Left ventricular diastolic parameters were normal. 2. Right ventricular systolic function is normal. The right ventricular size is normal. 3. The mitral valve is grossly normal. No evidence of mitral valve regurgitation. 4. The aortic valve is calcified. There is mild thickening of the aortic valve. Aortic valve regurgitation is mild. Moderate aortic valve stenosis. Aortic valve mean gradient measures 32.0 mmHg. Aortic valve Vmax measures 3.56 m/s.    Exercise Sestamibi stress test 04/12/2022: Exercise nuclear stress test was performed using Bruce protocol.  1 Day Rest and Stress images. Exercise time 4 minutes 14 seconds, achieved 6.12METS, 86% APMHR.  Stress ECG negative for ischemia with rare PVCs at peak stress.  Hypertensive response to exercise.  Medium size, mild intensity reversible perfusion defect, involving base to mid inferior and basal inferolateral segments, suggestive of ischemia in RCA/LCX distribution.  Left ventricular size normal, calculated LVEF  50% but visually appears preserved. No prior studies available for comparison. Low risk study, clinical correlation required (reduced functional capacity for age, hypertensive response to exercise, PVC at peak stress, perfusion findings as noted above).   EKG  EKG is 05/10/2022: Normal sinus rhythm at rate of 87 bpm, normal axis.  No evidence of ischemia, normal EKG.  EKG 01/18/2022: Normal sinus rhythm, 93 bpm, without underlying ischemia or injury pattern.  Assessment   1.  NSTEMI 2.  Diabetes mellitus type 2 uncontrolled with hyperglycemia on insulin 3.  Strong family history of premature coronary artery disease, 1 brother had MI at the age of 23, another brother with coronary artery disease and stents in his 65s, third brother died with coronary artery disease in his 42s.  Both his parents had coronary artery disease in their 42s. 4.  Primary hypertension 5.  Hypercholesterolemia  Recommendations:   Jonathon Bray  is a 57 y.o. Caucasian male patient with moderate aortic stenosis, mild ascending aortic dilatation at 39 mm, aortic atherosclerosis, primary hypertension, hypercholesterolemia, type 2 diabetes mellitus uncontrolled, gout, strong family history of premature coronary disease with multiple members in his family including his brothers and sister having coronary disease in the late 36s and 57 years of age and moderate obesity who was evaluated by my partner Dr. Terri Skains last seen by him on 04/23/2022 and recommended cardiac catheterization which is scheduled for tomorrow.  Patient presented to [facility] yesterday with chest pain, was found to have NSTEMI  I discussed with the patient regarding proceeding with cardiac catheterization. Schedule for cardiac catheterization, and possible angioplasty. We discussed regarding risks,  benefits, alternatives to this including stress testing, CTA and continued medical therapy. Patient wants to proceed. Understands <1-2% risk of  death, stroke, MI, urgent CABG, bleeding, infection, renal failure but not limited to these.   He is hypertensive today, I will address this after the cardiac catheterization.  Lipids are not been controlled but needs repeat lipid profile testing,.  With regard to diabetes mellitus, he is trying his best to lose weight, he has lost about 70 to 80 pounds in weight, trying to eat right.  He is a non-smoker.   Adrian Prows, MD, [facility] 05/10/2022, 12:16 PM Office: [phone] Fax: [phone] Pager: [phone]

## 2022-05-11 ENCOUNTER — Other Ambulatory Visit (HOSPITAL_COMMUNITY): Payer: Self-pay

## 2022-05-11 ENCOUNTER — Encounter (HOSPITAL_COMMUNITY): Admission: RE | Payer: Self-pay | Source: Home / Self Care

## 2022-05-11 ENCOUNTER — Observation Stay (HOSPITAL_BASED_OUTPATIENT_CLINIC_OR_DEPARTMENT_OTHER): Payer: 59

## 2022-05-11 ENCOUNTER — Ambulatory Visit (HOSPITAL_COMMUNITY): Admission: RE | Admit: 2022-05-11 | Payer: 59 | Source: Home / Self Care | Admitting: Cardiology

## 2022-05-11 ENCOUNTER — Observation Stay (HOSPITAL_COMMUNITY): Payer: 59

## 2022-05-11 DIAGNOSIS — E1169 Type 2 diabetes mellitus with other specified complication: Secondary | ICD-10-CM

## 2022-05-11 DIAGNOSIS — I77819 Aortic ectasia, unspecified site: Secondary | ICD-10-CM

## 2022-05-11 DIAGNOSIS — I409 Acute myocarditis, unspecified: Secondary | ICD-10-CM | POA: Diagnosis not present

## 2022-05-11 DIAGNOSIS — I700 Atherosclerosis of aorta: Secondary | ICD-10-CM

## 2022-05-11 DIAGNOSIS — I350 Nonrheumatic aortic (valve) stenosis: Secondary | ICD-10-CM

## 2022-05-11 DIAGNOSIS — I10 Essential (primary) hypertension: Secondary | ICD-10-CM

## 2022-05-11 DIAGNOSIS — Z8249 Family history of ischemic heart disease and other diseases of the circulatory system: Secondary | ICD-10-CM

## 2022-05-11 LAB — RESPIRATORY PANEL BY PCR

## 2022-05-11 LAB — BASIC METABOLIC PANEL
Anion gap: 10 (ref 5–15)
Anion gap: 9 (ref 5–15)
BUN: 13 mg/dL (ref 6–20)
BUN: 23 mg/dL — ABNORMAL HIGH (ref 6–20)
CO2: 26 mmol/L (ref 22–32)
CO2: 28 mmol/L (ref 22–32)
Calcium: 8.8 mg/dL — ABNORMAL LOW (ref 8.9–10.3)
Calcium: 8.9 mg/dL (ref 8.9–10.3)
Chloride: 98 mmol/L (ref 98–111)
Chloride: 99 mmol/L (ref 98–111)
Creatinine, Ser: 0.91 mg/dL (ref 0.61–1.24)
Creatinine, Ser: 1.21 mg/dL (ref 0.61–1.24)
GFR, Estimated: >60 mL/min (ref >60)
GFR, Estimated: >60 mL/min (ref >60)
Glucose, Bld: 289 mg/dL — ABNORMAL HIGH (ref 70–99)
Glucose, Bld: 227 mg/dL — ABNORMAL HIGH (ref 70–99)
Potassium: 4.3 mmol/L (ref 3.5–5.1)
Potassium: 4.6 mmol/L (ref 3.5–5.1)
Sodium: 134 mmol/L — ABNORMAL LOW (ref 135–145)
Sodium: 136 mmol/L (ref 135–145)

## 2022-05-11 LAB — ECHOCARDIOGRAM COMPLETE
AR max vel: 0.7 cm2
AV Area VTI: 0.64 cm2
AV Area mean vel: 0.64 cm2
AV Mean grad: 37.3 mmHg
AV Peak grad: 61.5 mmHg
Ao pk vel: 3.92 m/s
Area-P 1/2: 3.93 cm2
Height: 65 in
S' Lateral: 3.1 cm
Weight: 3424 oz

## 2022-05-11 LAB — TROPONIN I (HIGH SENSITIVITY)
Troponin I (High Sensitivity): 2319 ng/L (ref <18)
Troponin I (High Sensitivity): 2395 ng/L (ref <18)
Troponin I (High Sensitivity): 2244 ng/L (ref <18)
Troponin I (High Sensitivity): 2450 ng/L (ref <18)

## 2022-05-11 LAB — GLUCOSE, CAPILLARY
Glucose-Capillary: 202 mg/dL — ABNORMAL HIGH (ref 70–99)
Glucose-Capillary: 274 mg/dL — ABNORMAL HIGH (ref 70–99)
Glucose-Capillary: 197 mg/dL — ABNORMAL HIGH (ref 70–99)
Glucose-Capillary: 318 mg/dL — ABNORMAL HIGH (ref 70–99)

## 2022-05-11 LAB — SEDIMENTATION RATE: Sed Rate: 8 mm/hr (ref 0–16)

## 2022-05-11 LAB — RAPID URINE DRUG SCREEN, HOSP PERFORMED
Amphetamines: NOT DETECTED
Barbiturates: NOT DETECTED
Benzodiazepines: POSITIVE — AB
Cocaine: NOT DETECTED
Opiates: NOT DETECTED
Tetrahydrocannabinol: NOT DETECTED

## 2022-05-11 LAB — BRAIN NATRIURETIC PEPTIDE: B Natriuretic Peptide: 116.1 pg/mL — ABNORMAL HIGH (ref 0.0–100.0)

## 2022-05-11 LAB — C-REACTIVE PROTEIN: CRP: 0.6 mg/dL (ref <1.0)

## 2022-05-11 LAB — MAGNESIUM: Magnesium: 1.6 mg/dL — ABNORMAL LOW (ref 1.7–2.4)

## 2022-05-11 LAB — D-DIMER, QUANTITATIVE: D-Dimer, Quant: 0.29 ug/mL-FEU (ref 0.00–0.50)

## 2022-05-11 SURGERY — LEFT HEART CATH AND CORONARY ANGIOGRAPHY
Anesthesia: LOCAL

## 2022-05-11 MED ORDER — GADOBUTROL 1 MMOL/ML IV SOLN
10.0000 mL | Freq: Once | INTRAVENOUS | Status: AC | PRN
  Administered 2022-05-11: 10 mL via INTRAVENOUS

## 2022-05-11 MED ORDER — DAPAGLIFLOZIN PROPANEDIOL 5 MG PO TABS
5.0000 mg | ORAL_TABLET | Freq: Every day | ORAL | Status: DC
  Administered 2022-05-11 – 2022-05-12 (×2): 5 mg via ORAL
  Filled 2022-05-11 (×2): qty 1

## 2022-05-11 MED ORDER — INDOMETHACIN 25 MG PO CAPS
25.0000 mg | ORAL_CAPSULE | Freq: Three times a day (TID) | ORAL | Status: DC
  Administered 2022-05-11 – 2022-05-12 (×2): 25 mg via ORAL
  Filled 2022-05-11 (×3): qty 1

## 2022-05-11 MED ORDER — DAPAGLIFLOZIN PROPANEDIOL 10 MG PO TABS: 10.0000 mg | ORAL_TABLET | Freq: Every day | ORAL | Status: DC

## 2022-05-11 MED ORDER — PANTOPRAZOLE SODIUM 40 MG PO TBEC
40.0000 mg | DELAYED_RELEASE_TABLET | Freq: Every day | ORAL | Status: DC
  Administered 2022-05-11 – 2022-05-12 (×2): 40 mg via ORAL
  Filled 2022-05-11 (×2): qty 1

## 2022-05-11 MED ORDER — METOPROLOL SUCCINATE ER 100 MG PO TB24
100.0000 mg | ORAL_TABLET | Freq: Every day | ORAL | Status: DC
  Administered 2022-05-12: 100 mg via ORAL
  Filled 2022-05-11: qty 1

## 2022-05-11 MED ORDER — INDOMETHACIN 25 MG PO CAPS: 50.0000 mg | ORAL_CAPSULE | Freq: Three times a day (TID) | ORAL | Status: DC

## 2022-05-11 MED ORDER — COLCHICINE 0.6 MG PO TABS
0.6000 mg | ORAL_TABLET | Freq: Two times a day (BID) | ORAL | Status: DC
  Administered 2022-05-11 – 2022-05-12 (×2): 0.6 mg via ORAL
  Filled 2022-05-11 (×2): qty 1

## 2022-05-11 MED ORDER — PERFLUTREN LIPID MICROSPHERE
1.0000 mL | INTRAVENOUS | Status: AC | PRN
  Administered 2022-05-11: 6 mL via INTRAVENOUS

## 2022-05-11 MED ORDER — MAGNESIUM SULFATE 4 GM/100ML IV SOLN
4.0000 g | Freq: Once | INTRAVENOUS | Status: AC
  Administered 2022-05-11: 4 g via INTRAVENOUS
  Filled 2022-05-11: qty 100

## 2022-05-11 NOTE — Progress Notes (Signed)
Progress Note  Patient Name: Jonathon Bray MRN: [id number] DOB: [birth year] Date of Encounter: 05/11/2022  Attending physician: Adrian Prows, MD Primary care provider: Glenda Chroman, MD Primary Cardiologist: Rex Kras, DO, [facility]  Subjective: Jonathon Bray is a 57 y.o. Caucasian male who was seen and examined at bedside  Accompanied by his wife at bedside. Denies anginal discomfort or heart failure symptoms. Wishes to go home.   Currently getting an echocardiogram. Case discussed and reviewed with his nurse.  Objective: Vital Signs in the last 24 hours: Temp:  [97.8 F (36.6 C)-98.4 F (36.9 C)] 97.8 F (36.6 C) (02/06 0810) Pulse Rate:  [84-95] 92 (02/06 0810) Resp:  [13-24] 18 (02/06 0810) BP: (127-186)/(83-115) 129/90 (02/06 0810) SpO2:  [94 %-99 %] 97 % (02/06 0810) Weight:  [97.1 kg] 97.1 kg (02/05 1120)  Intake/Output:  Intake/Output Summary (Last 24 hours) at 05/11/2022 1021 Last data filed at 05/11/2022 0830 Gross per 24 hour  Intake 2487.47 ml  Output --  Net 2487.47 ml    Net IO Since Admission: 2,487.47 mL [05/11/22 1021]  Weights:     05/10/2022   11:20 AM 04/28/2022    3:32 PM 04/23/2022    9:18 AM  Last 3 Weights  Weight (lbs) 214 lb 208 lb 8 oz 219 lb  Weight (kg) 97.07 kg 94.575 kg 99.338 kg      Telemetry:  Overnight telemetry shows sinus rhythm, which I personally reviewed.   Physical examination: PHYSICAL EXAM: Vitals:   05/10/22 1951 05/10/22 2351 05/11/22 0346 05/11/22 0810  BP: 127/85 139/88 (!) 139/92 (!) 129/90  Pulse: 88 86 84 92  Resp: '18 19 18 18  '$ Temp: 98.4 F (36.9 C) 98.1 F (36.7 C) 98 F (36.7 C) 97.8 F (36.6 C)  TempSrc: Oral Oral Oral Oral  SpO2: 98% 96% 99% 97%  Weight:      Height:        Physical Exam  Constitutional: No distress.  Appears older than stated age, hemodynamically stable.   HENT:  Poor dental hygiene  Neck: No JVD present.  Cardiovascular: Normal rate, regular rhythm, S1 normal, S2 normal,  intact distal pulses and normal pulses. Exam reveals no gallop, no S3 and no S4.  Murmur heard. Crescendo-decrescendo midsystolic murmur is present with a grade of 3/6 at the upper right sternal border radiating to the neck. Pulses:      Carotid pulses are  on the right side with bruit and  on the left side with bruit. Pulmonary/Chest: Effort normal and breath sounds normal. No stridor. He has no wheezes. He has no rales.  Abdominal: Soft. Bowel sounds are normal. He exhibits no distension. There is no abdominal tenderness.  Musculoskeletal:        General: No edema.     Cervical back: Neck supple.  Neurological: He is alert and oriented to person, place, and time. He has intact cranial nerves (2-12).  Skin: Skin is warm and moist.   Lab Results: Chemistry Recent Labs  Lab 05/10/22 1356  NA 133*  K 3.7  CL 99  CO2 27  GLUCOSE 137*  BUN 14  CREATININE 0.77  CALCIUM 8.8*  PROT 6.1*  ALBUMIN 3.1*  AST 43*  ALT 21  ALKPHOS 49  BILITOT 0.7  GFRNONAA >60  ANIONGAP 7    Hematology Recent Labs  Lab 05/10/22 1356  WBC 12.8*  RBC 5.17  HGB 15.2  HCT 42.9  MCV 83.0  MCH 29.4  MCHC 35.4  RDW  12.4  PLT 199   High Sensitivity Troponin:   Hs trop 05/09/2022 23 followed by 363 followed by 1,199 (05/30/2022) - see care everywhere  Recent Labs  Lab 05/10/22 1548  TROPONINIHS 6,077*     Cardiac EnzymesNo results for input(s): "TROPONINI" in the last 168 hours. No results for input(s): "TROPIPOC" in the last 168 hours.  BNPNo results for input(s): "BNP", "PROBNP" in the last 168 hours.  DDimer No results for input(s): "DDIMER" in the last 168 hours.  Hemoglobin A1c:  Lab Results  Component Value Date   HGBA1C 9.4 (H) 05/10/2022   MPG 223.08 05/10/2022   TSH  Recent Labs    02/22/22 1004  TSH 1.130   Lipid Panel  Lab Results  Component Value Date   CHOL 116 05/10/2022   HDL 45 05/10/2022   LDLCALC 58 05/10/2022   LDLDIRECT 119 (H) 02/22/2022   TRIG 63  05/10/2022   CHOLHDL 2.6 05/10/2022    Drugs of Abuse     Component Value Date/Time   LABOPIA NONE DETECTED 01/24/2014 1121   COCAINSCRNUR NONE DETECTED 01/24/2014 1121   LABBENZ NONE DETECTED 01/24/2014 1121   AMPHETMU NONE DETECTED 01/24/2014 1121   THCU NONE DETECTED 01/24/2014 1121   LABBARB NONE DETECTED 01/24/2014 1121    Imaging: CARDIAC CATHETERIZATION  Result Date: 05/10/2022 Coronary angiography 05/10/22: Normal coronary arteries, right dominant circulation.  Unable to cross into the LV in spite of aggressive attempts. Impression: Elevated cardiac markers could be related to either pericarditis or myocarditis although EKG essentially normal.  Will repeat echocardiogram. Patient can be discharged home tomorrow if he remains stable.  No indication for aspirin.    CARDIAC DATABASE: EKG: 01/18/2022: Normal sinus rhythm, 93 bpm, without underlying ischemia or injury pattern. 05/10/2022: Normal sinus rhythm at rate of 87 bpm, normal axis.  No evidence of ischemia, normal EKG.  Echocardiogram: 01/25/2014: LVEF 60%.    02/16/2022: 1. Left ventricular ejection fraction, by estimation, is 60 to 65%. Left ventricular ejection fraction by PLAX is 70 %. The left ventricle has normal function. The left ventricle has no regional wall motion abnormalities. The left ventricular internal cavity size was mildly dilated. Left ventricular diastolic parameters were normal. 2. Right ventricular systolic function is normal. The right ventricular size is normal. 3. The mitral valve is grossly normal. No evidence of mitral valve regurgitation. 4. The aortic valve is calcified. There is mild thickening of the aortic valve. Aortic valve regurgitation is mild. Moderate aortic valve stenosis. Aortic valve mean gradient measures 32.0 mmHg. Aortic valve Vmax measures 3.56 m/s.    Stress Testing: Exercise Sestamibi stress test 04/12/2022: Exercise nuclear stress test was performed using Bruce protocol.   1 Day Rest and Stress images. Exercise time 4 minutes 14 seconds, achieved 6.12METS, 86% APMHR.  Stress ECG negative for ischemia with rare PVCs at peak stress.  Hypertensive response to exercise.  Medium size, mild intensity reversible perfusion defect, involving base to mid inferior and basal inferolateral segments, suggestive of ischemia in RCA/LCX distribution.  Left ventricular size normal, calculated LVEF 50% but visually appears preserved. No prior studies available for comparison. Low risk study, clinical correlation required (reduced functional capacity for age, hypertensive response to exercise, PVC at peak stress, perfusion findings as noted above).    Heart Catheterization: None   Carotid Duplex 02/16/2022: Right Carotid: The extracranial vessels were near-normal with only minimal wall thickening or plaque.  Left Carotid: The extracranial vessels were near-normal with only minimal wall thickening or plaque.  Vertebrals: Bilateral vertebral arteries demonstrate antegrade flow.  Subclavians: Normal flow hemodynamics were seen in bilateral subclavian arteries.   Scheduled Meds:  atorvastatin  40 mg Oral q AM   dapagliflozin propanediol  5 mg Oral Daily   hydrochlorothiazide  25 mg Oral Daily   insulin aspart  0-20 Units Subcutaneous TID WC   insulin aspart  4 Units Subcutaneous TID WC   insulin NPH Human  30 Units Subcutaneous BID AC & HS   losartan  50 mg Oral QPM   metoprolol succinate  50 mg Oral Daily   pregabalin  50 mg Oral BID   sodium chloride flush  3 mL Intravenous Q12H    Continuous Infusions:  sodium chloride      PRN Meds: sodium chloride, acetaminophen, ondansetron (ZOFRAN) IV, sodium chloride flush   IMPRESSION & RECOMMENDATIONS: Jonathon Bray is a 57 y.o. Caucasian male whose past medical history and cardiac risk factors include: moderate aortic stenosis, mild ascending aortic dilatation at 39 mm, aortic atherosclerosis, primary hypertension,  hypercholesterolemia, type 2 diabetes mellitus uncontrolled, gout, strong family history of premature coronary disease with multiple members in his family including his brothers and sister having coronary disease in the late 76s and 57 years of age.  Impression:  NSTEMI Elevated high sensitive troponins Aortic stenosis-moderate Family history of premature coronary artery disease. Hypertension, benign. Type 2 diabetes with hyperglycemia with insulin-dependent diabetes. Type 2 diabetes with hyperlipidemia. Atherosclerosis of the aorta. Ascending aortic dilatation   Recommendations: Patient presents to the hospital with presentation concerning for NSTEMI.  Undergoes invasive angiography and was noted to have no significant epicardial coronary artery disease.  Interventionalist was unable to cross the aortic valve despite multiple events given the underlying aortic stenosis.  Despite no significant disease in the epicardial coronary arteries his high sensitive troponin continues to trend up which raises a concern for possible myopericarditis.  Will check ESR, CRP, respiratory pathogen.  And cardiac MRI.  2D echocardiogram results are pending.  Continue metoprolol and losartan.  Will start Farxiga 5 mg p.o. daily given his underlying diabetes and to help facilitate diuresis.  Reemphasized importance of glycemic control.  He was started on atorvastatin in December and his lipids have improved.  Continue current medical therapy.  Patient was getting a bedside echocardiogram while my encounter today and and on  some nondedicated RV views it appeared to be dilated.  Given the troponin leak as well as heart rate in the 80s and 90s despite being on metoprolol would like to check a D-dimer. If elevated will proceed w/ CT PE protocol to rule out possible PE.  Ordered morning labs.   Patient's questions and concerns were addressed to his satisfaction. He voices understanding of the instructions  provided during this encounter.   This note was created using a voice recognition software as a result there may be grammatical errors inadvertently enclosed that do not reflect the nature of this encounter. Every attempt is made to correct such errors.  Mechele [facility]  Pager: [phone] Office: [phone] 05/11/2022, 10:21 AM

## 2022-05-11 NOTE — Progress Notes (Signed)
Echocardiogram 2D Echocardiogram has been performed.  Jonathon Bray 05/11/2022, 11:07 AM

## 2022-05-11 NOTE — TOC Benefit Eligibility Note (Signed)
Patient Teacher, English as a foreign language completed.    The patient is currently admitted and upon discharge could be taking Farxiga 10 mg.  The current 30 day co-pay is $150.00 Can use Copay Card.   The patient is insured through [place], [place] Patient Advocate Specialist [place] Patient Advocate Team Direct Number: [phone]  Fax: [phone]

## 2022-05-11 NOTE — Progress Notes (Signed)
Date and time results received: 05/11/22 2140  Test: Troponin  Critical Value: 2244  Name of Provider Notified: S. Custovic, MD  Orders Received? No Or Actions Taken?: None

## 2022-05-11 NOTE — Inpatient Diabetes Management (Signed)
Inpatient Diabetes Program Recommendations  AACE/ADA: New Consensus Statement on Inpatient Glycemic Control (2015)  Target Ranges:  Prepandial:   less than 140 mg/dL      Peak postprandial:   less than 180 mg/dL (1-2 hours)      Critically ill patients:  140 - 180 mg/dL   Lab Results  Component Value Date   GLUCAP 274 (H) 05/11/2022   HGBA1C 9.4 (H) 05/10/2022    Review of Glycemic Control  Diabetes history: DM 2 Outpatient Diabetes medications: Trulicity 3 mg Qfriday, Humalog 16 units bid, NPH 30 units bid, Metformin 1000 mg bid, Freestyle Libre 2 CGM Current orders for Inpatient glycemic control:  Farxiga 5 mg Daily NPH 30 units bid Novolog 0-20 units tid  Novolog 4 units tid meal coverage  Diet carb modified A1c 9.4%  Inpatient Diabetes Program Recommendations:    -   Increase Novolog meal coverage to 10 units tid if eating >50% of meals  -   NOTE: invokana listed under allergies please review Farxiga medication for pt (same class)  Spoke with pt at bedside regarding his A1c of 9.4% and glucose control at home. Pt reportedly not wearing his CGM for the last week. However, his glucose trends were elevated at home. Pt reports 6 months ago his A1c was around a 6%. Since that time his grand daughter has had major health issues and he has had a lot of stress at work. Pt reports weight loss as well. Discussed current A1c level. Discussed glucose and A1c goals at home.    Thanks,  Tama Headings RN, MSN, BC-ADM Inpatient Diabetes Coordinator Team Pager [phone] (8a-5p)

## 2022-05-12 ENCOUNTER — Other Ambulatory Visit (HOSPITAL_COMMUNITY): Payer: Self-pay

## 2022-05-12 ENCOUNTER — Other Ambulatory Visit: Payer: Self-pay

## 2022-05-12 ENCOUNTER — Encounter: Payer: Self-pay | Admitting: Cardiology

## 2022-05-12 DIAGNOSIS — I2510 Atherosclerotic heart disease of native coronary artery without angina pectoris: Secondary | ICD-10-CM | POA: Diagnosis not present

## 2022-05-12 DIAGNOSIS — I214 Non-ST elevation (NSTEMI) myocardial infarction: Secondary | ICD-10-CM | POA: Diagnosis not present

## 2022-05-12 DIAGNOSIS — I409 Acute myocarditis, unspecified: Secondary | ICD-10-CM | POA: Diagnosis not present

## 2022-05-12 DIAGNOSIS — I10 Essential (primary) hypertension: Secondary | ICD-10-CM | POA: Diagnosis not present

## 2022-05-12 DIAGNOSIS — I401 Isolated myocarditis: Secondary | ICD-10-CM

## 2022-05-12 LAB — LIPOPROTEIN A (LPA): Lipoprotein (a): 161.1 nmol/L — ABNORMAL HIGH (ref <75.0)

## 2022-05-12 LAB — CBC WITH DIFFERENTIAL/PLATELET
Abs Immature Granulocytes: 0.04 10*3/uL (ref 0.00–0.07)
Basophils Absolute: 0 10*3/uL (ref 0.0–0.1)
Basophils Relative: 0 %
Eosinophils Absolute: 0.5 10*3/uL (ref 0.0–0.5)
Eosinophils Relative: 4 %
HCT: 45.4 % (ref 39.0–52.0)
Hemoglobin: 16.3 g/dL (ref 13.0–17.0)
Immature Granulocytes: 0 %
Lymphocytes Relative: 25 %
Lymphs Abs: 2.8 10*3/uL (ref 0.7–4.0)
MCH: 29.7 pg (ref 26.0–34.0)
MCHC: 35.9 g/dL (ref 30.0–36.0)
MCV: 82.7 fL (ref 80.0–100.0)
Monocytes Absolute: 0.8 10*3/uL (ref 0.1–1.0)
Monocytes Relative: 7 %
Neutro Abs: 6.9 10*3/uL (ref 1.7–7.7)
Neutrophils Relative %: 64 %
Platelets: 220 10*3/uL (ref 150–400)
RBC: 5.49 MIL/uL (ref 4.22–5.81)
RDW: 12.3 % (ref 11.5–15.5)
WBC: 11 10*3/uL — ABNORMAL HIGH (ref 4.0–10.5)
nRBC: 0 % (ref 0.0–0.2)

## 2022-05-12 LAB — BASIC METABOLIC PANEL
Anion gap: 12 (ref 5–15)
BUN: 22 mg/dL — ABNORMAL HIGH (ref 6–20)
CO2: 25 mmol/L (ref 22–32)
Calcium: 8.9 mg/dL (ref 8.9–10.3)
Chloride: 100 mmol/L (ref 98–111)
Creatinine, Ser: 1.11 mg/dL (ref 0.61–1.24)
GFR, Estimated: >60 mL/min (ref >60)
Glucose, Bld: 165 mg/dL — ABNORMAL HIGH (ref 70–99)
Potassium: 4.6 mmol/L (ref 3.5–5.1)
Sodium: 137 mmol/L (ref 135–145)

## 2022-05-12 LAB — GLUCOSE, CAPILLARY: Glucose-Capillary: 170 mg/dL — ABNORMAL HIGH (ref 70–99)

## 2022-05-12 LAB — BRAIN NATRIURETIC PEPTIDE: B Natriuretic Peptide: 72.8 pg/mL (ref 0.0–100.0)

## 2022-05-12 LAB — MAGNESIUM: Magnesium: 2.6 mg/dL — ABNORMAL HIGH (ref 1.7–2.4)

## 2022-05-12 MED ORDER — DAPAGLIFLOZIN PROPANEDIOL 5 MG PO TABS
5.0000 mg | ORAL_TABLET | Freq: Every day | ORAL | 0 refills | Status: DC
  Filled 2022-05-12: qty 30, 30d supply, fill #0

## 2022-05-12 MED ORDER — COLCHICINE 0.6 MG PO TABS
0.6000 mg | ORAL_TABLET | Freq: Two times a day (BID) | ORAL | 0 refills | Status: DC
  Filled 2022-05-12: qty 60, 30d supply, fill #0

## 2022-05-12 MED ORDER — METOPROLOL SUCCINATE ER 50 MG PO TB24
50.0000 mg | ORAL_TABLET | Freq: Every day | ORAL | 0 refills | Status: DC
  Filled 2022-05-12: qty 30, 30d supply, fill #0

## 2022-05-12 MED ORDER — ORAL CARE MOUTH RINSE: 15.0000 mL | OROMUCOSAL | Status: DC | PRN

## 2022-05-12 MED ORDER — PANTOPRAZOLE SODIUM 40 MG PO TBEC
40.0000 mg | DELAYED_RELEASE_TABLET | Freq: Every day | ORAL | 0 refills | Status: DC
  Filled 2022-05-12: qty 30, 30d supply, fill #0

## 2022-05-12 MED ORDER — INDOMETHACIN 25 MG PO CAPS
25.0000 mg | ORAL_CAPSULE | Freq: Three times a day (TID) | ORAL | 0 refills | Status: AC
  Filled 2022-05-12: qty 21, 7d supply, fill #0

## 2022-05-12 MED ORDER — INDOMETHACIN 50 MG PO CAPS
50.0000 mg | ORAL_CAPSULE | Freq: Three times a day (TID) | ORAL | 0 refills | Status: DC
  Filled 2022-05-12: qty 21, 7d supply, fill #0

## 2022-05-12 NOTE — Discharge Summary (Signed)
Physician Discharge Summary  Patient ID: Jonathon Bray MRN: [id number] DOB/AGE: Jun 28, 1965 57 y.o.  Admit date: 05/10/2022 Discharge date: 05/12/2022  Primary Discharge Diagnosis: Myocarditis  NSTEMI - likely supply demand (type II)  Secondary Discharge Diagnosis: Aortic stenosis -moderate/severe History of premature CAD. Benign hypertension. 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 Type 2 diabetes with hyperlipidemia. Atherosclerosis of aorta. Ascending aortic dilatation  Hospital Course:   57 y.o. Caucasian male  with moderate to severe aortic stenosis, mild ascending aortic dilatation at 39 mm, aortic atherosclerosis, primary hypertension, hypercholesterolemia, type 2 diabetes mellitus uncontrolled, gout, strong family history of premature coronary disease with multiple members in his family including his brothers and sister having coronary disease in the late 61s and 57 years of age.   Patient presented to [facility] with concerns for NSTEMI given his chest pain and elevated troponins.  He was transferred to [facility] for further evaluation and management.  He underwent coronary angiography which noted no significant epicardial coronary disease and attempts are made to cross aortic valve but were unsuccessful due to underlying aortic stenosis.  Since the troponins continue to trend upward he was monitored closely during hospitalization and also a cardiac MRI consistent with myocarditis per report.  Clinically denies anginal discomfort and heart failure symptoms.  No significant dysrhythmias on telemetry.  BNP within normal limits.  High sensitive troponin is trending down.  Patient wanted to go home.  Understands that he needs close cardiology follow-up and to avoid exertional activities.  Discharge Exam: Temp:  [97.6 F (36.4 C)-97.9 F (36.6 C)] 97.9 F (36.6 C) (02/07 0727) Pulse Rate:  [78-81] 78 (02/07 0727) Cardiac Rhythm: Normal sinus  rhythm (02/07 0746) Resp:  [17-19] 17 (02/07 0727) BP: (110-126)/(71-86) 124/86 (02/07 0727) SpO2:  [95 %-97 %] 97 % (02/07 0727)  Today's Vitals   05/11/22 1935 05/11/22 2000 05/12/22 0534 05/12/22 0727  BP: 110/71  126/85 124/86  Pulse:   81 78  Resp: '19  18 17  '$ Temp: 97.8 F (36.6 C)  97.6 F (36.4 C) 97.9 F (36.6 C)  TempSrc: Oral  Oral Oral  SpO2: 97%  95% 97%  Weight:      Height:      PainSc:  0-No pain  0-No pain   Body mass index is 35.61 kg/m.  Wt Readings from Last 3 Encounters:  05/10/22 97.1 kg  04/28/22 94.6 kg  04/23/22 99.3 kg   Net IO Since Admission: 2,727.47 mL [05/12/22 0954]  Physical Exam  Constitutional: No distress.  Appears older than stated age, hemodynamically stable.   HENT:  Poor dental hygiene  Neck: No JVD present.  Cardiovascular: Normal rate, regular rhythm, S1 normal, S2 normal, intact distal pulses and normal pulses. Exam reveals no gallop, no S3 and no S4.  Murmur heard. Crescendo-decrescendo midsystolic murmur is present with a grade of 3/6 at the upper right sternal border radiating to the neck. Pulses:      Carotid pulses are  on the right side with bruit and  on the left side with bruit. Pulmonary/Chest: Effort normal and breath sounds normal. No stridor. He has no wheezes. He has no rales.  Abdominal: Soft. Bowel sounds are normal. He exhibits no distension. There is no abdominal tenderness.  Musculoskeletal:        General: No edema.     Cervical back: Neck supple.  Neurological: He is alert and oriented to person, place, and time. He has intact cranial nerves (2-12).  Skin:  Skin is warm and moist.    Recommendations on discharge:  #1 indomethacin 25 mg p.o. 3 times daily for 1 week followed by indomethacin 50 mg p.o. 3 times daily for 1 week. #2 start colchicine twice daily based on his weight for 90 days. #3 start Protonix while he is on indomethacin for GI prophylaxis Patient was informed of cardiac MRI findings are  concerning for myocarditis.  His LV EF is preserved.  No significant dysrhythmias on telemetry.  He does not have any chest pain/pleuritic discomfort.  No obvious pericardial effusion on diagnostic imaging.  No significant dysrhythmias on telemetry.  BNP trending down.  Respiratory panel for 20 pathogens negative.  ESR and CRP within normal limits.  Still needs to be cautious and not participated exertional activities for the next 3 months. #4 patient requesting a work note for the next 2 weeks until he is reevaluated at the office. #5 additional care visit scheduled for 05/25/2022 with labs prior to the visit. #6 started on Farxiga given his diabetes.  He has been intolerant Invokana in the past.  But has done well on Farxiga during hospitalization.  If he has symptoms similar to what he experienced with Invokana I have asked him to hold [place]. #7 plan of care discussed with wife and patient as well as nursing staff. #8 medication sent to [facility].   CARDIAC DATABASE: EKG: 01/18/2022: Normal sinus rhythm, 93 bpm, without underlying ischemia or injury pattern. 05/10/2022: Normal sinus rhythm at rate of 87 bpm, normal axis.  No evidence of ischemia, normal EKG.  Echocardiogram: 01/25/2014: LVEF 60%.    02/16/2022: LVEF 06-30%, normal diastolic function, Moderate aortic valve stenosis. Aortic valve mean gradient measures 32.0 mmHg. Aortic valve Vmax measures 3.56 m/s.  See report for additional details.  05/31/2022:  1. Left ventricular ejection fraction, by estimation, is 60 to 65%. The  left ventricle has normal function. The left ventricle has no regional  wall motion abnormalities. Left ventricular diastolic parameters were  normal.   2. Right ventricular systolic function is normal. The right ventricular  size is normal.   3. The mitral valve is grossly normal. Trivial mitral valve  regurgitation. No evidence of mitral stenosis.   4. The aortic valve is calcified. Aortic valve  regurgitation is not  visualized. Moderate to severe aortic valve stenosis (peak velocity  3.47ms, MG 37.374mg, DI 0.20).   5. The inferior vena cava is normal in size with greater than 50%  respiratory variability, suggesting right atrial pressure of 3 mmHg.   6. Rhythm strip during this exam demonstrates normal sinus rhythm.   Comparison(s): A prior study was performed on 02/16/2022. LVEF 60-65%,  moderate AS (peak velocity 3.5659m MG 73m74m   Stress Testing: Exercise Sestamibi stress test 04/12/2022: Exercise nuclear stress test was performed using Bruce protocol.  1 Day Rest and Stress images. Exercise time 4 minutes 14 seconds, achieved 6.12METS, 86% APMHR.  Stress ECG negative for ischemia with rare PVCs at peak stress.  Hypertensive response to exercise.  Medium size, mild intensity reversible perfusion defect, involving base to mid inferior and basal inferolateral segments, suggestive of ischemia in RCA/LCX distribution.  Left ventricular size normal, calculated LVEF 50% but visually appears preserved. No prior studies available for comparison. Low risk study, clinical correlation required (reduced functional capacity for age, hypertensive response to exercise, PVC at peak stress, perfusion findings as noted above).    Coronary angiography 05/10/22:  Normal coronary arteries, right dominant circulation.  Unable to  cross into the LV in spite of aggressive attempts.   Impression: Elevated cardiac markers could be related to either pericarditis or myocarditis although EKG essentially normal.  Will repeat echocardiogram.   Patient can be discharged home tomorrow if he remains stable.  No indication for aspirin.  Cardiac MRI  05/11/2022: Modified [facility] Criteria is met for myocarditis.   LGE pattern can also be seen in obstructive coronary disease, but in lieu of this, with slight pericardial thickening and with lack of wall motion abnormality, study is most consistent with  myocarditis.   At least moderate aortic stenosis.    Carotid Duplex 02/16/2022: Right Carotid: The extracranial vessels were near-normal with only minimal wall thickening or plaque.  Left Carotid: The extracranial vessels were near-normal with only minimal wall thickening or plaque.  Vertebrals: Bilateral vertebral arteries demonstrate antegrade flow.  Subclavians: Normal flow hemodynamics were seen in bilateral subclavian arteries.    Lab Results  Component Value Date   WBC 11.0 (H) 05/12/2022   HGB 16.3 05/12/2022   HCT 45.4 05/12/2022   MCV 82.7 05/12/2022   PLT 220 05/12/2022    Recent Labs  Lab 05/10/22 1356 05/11/22 1050 05/11/22 2100  NA 133*   < > 136  K 3.7   < > 4.6  CL 99   < > 99  CO2 27   < > 28  BUN 14   < > 23*  CREATININE 0.77   < > 1.21  CALCIUM 8.8*   < > 8.9  PROT 6.1*  --   --   BILITOT 0.7  --   --   ALKPHOS 49  --   --   ALT 21  --   --   AST 43*  --   --   GLUCOSE 137*   < > 227*   < > = values in this interval not displayed.    Lipid Panel  Lab Results  Component Value Date   CHOL 116 05/10/2022   HDL 45 05/10/2022   LDLCALC 58 05/10/2022   LDLDIRECT 119 (H) 02/22/2022   TRIG 63 05/10/2022   CHOLHDL 2.6 05/10/2022   BNP (last 3 results) Recent Labs    05/11/22 1050 05/12/22 0532  BNP 116.1* 72.8    HEMOGLOBIN A1C Lab Results  Component Value Date   HGBA1C 9.4 (H) 05/10/2022   MPG 223.08 05/10/2022    Cardiac Panel (last 3 results) Recent Labs    05/11/22 1250 05/11/22 2100 05/11/22 2255  TROPONINIHS 2,395* 2,244* 2,450*     TSH Recent Labs    02/22/22 1004  TSH 1.130   Results for orders placed or performed during the hospital encounter of 05/10/22  Respiratory (~20 pathogens) panel by PCR     Status: None   Collection Time: 05/11/22 11:48 AM   Specimen: Nasopharyngeal Swab; Respiratory  Result Value Ref Range Status   Adenovirus NOT DETECTED NOT DETECTED Final   Coronavirus 229E NOT DETECTED NOT  DETECTED Final    Comment: (NOTE) The Coronavirus on the Respiratory Panel, DOES NOT test for the novel  Coronavirus (2019 nCoV)    Coronavirus HKU1 NOT DETECTED NOT DETECTED Final   Coronavirus NL63 NOT DETECTED NOT DETECTED Final   Coronavirus OC43 NOT DETECTED NOT DETECTED Final   Metapneumovirus NOT DETECTED NOT DETECTED Final   Rhinovirus / Enterovirus NOT DETECTED NOT DETECTED Final   Influenza A NOT DETECTED NOT DETECTED Final   Influenza B NOT DETECTED NOT DETECTED Final   Parainfluenza  Virus 1 NOT DETECTED NOT DETECTED Final   Parainfluenza Virus 2 NOT DETECTED NOT DETECTED Final   Parainfluenza Virus 3 NOT DETECTED NOT DETECTED Final   Parainfluenza Virus 4 NOT DETECTED NOT DETECTED Final   Respiratory Syncytial Virus NOT DETECTED NOT DETECTED Final   Bordetella pertussis NOT DETECTED NOT DETECTED Final   Bordetella Parapertussis NOT DETECTED NOT DETECTED Final   Chlamydophila pneumoniae NOT DETECTED NOT DETECTED Final   Mycoplasma pneumoniae NOT DETECTED NOT DETECTED Final    Comment: Performed at [facility], [place] [address] [place], [place] [address]      Component Value Date/Time   CRP 0.6 05/11/2022 1050   Lab Results  Component Value Date   ESRSEDRATE 8 05/11/2022   Radiology: MR CARDIAC MORPHOLOGY W WO CONTRAST  Result Date: 05/11/2022 CLINICAL DATA:  Clinical question of myocarditis EXAM: CARDIAC MRI TECHNIQUE: The patient was scanned on a 1.5 Tesla GE magnet. A dedicated cardiac coil was used. Functional imaging was done using Fiesta sequences. 2,3, and 4 chamber views were done to assess for RWMA's. Modified Simpson's rule using a short axis stack was used to calculate an ejection fraction on a dedicated work Conservation officer, nature. The patient received 10 cc of Gadavist. After 10 minutes inversion recovery sequences were used to assess for infiltration and scar tissue. Flow quantification was performed 2 times during this examination with flow  quantification performed at the levels of the ascending aorta above the valve, pulmonary artery above the valve. CONTRAST:  10 cc  of Gadavist FINDINGS: 1. Normal left ventricular size, with LVEDD 46 mm, and LVEDVi 53 mL/m2. Mild concentric increase in left ventricular thickness, with intraventricular septal thickness of 9 mm, posterior wall thickness of 9 mm, but myocardial mass index of 69 g/m2. Normal left ventricular systolic function (LVEF =01%). There are no regional wall motion abnormalities. Left ventricular parametric mapping notable for increase in T2 signal in the mid anterior (60 ms) with concurrent elevation in ECV signal (42%). There is late gadolinium enhancement in the left ventricular myocardium: Up to 50% endomyocardial LGE at the level of the mid anterior wall. 2. Normal right ventricular size with RVEDVI 40 mL/m2. Normal right ventricular thickness. Normal right ventricular systolic function (RVEF =75%). There are no regional wall motion abnormalities or aneurysms. 3.  Normal left and right atrial size. 4. Normal size of the aortic root, ascending aorta (39 mm aorta at the level of the PA bifurcation) and pulmonary artery. 5. Valve assessment: Aortic Valve: Valve is abnormally thickened. 2D area 1.36 cm2. At least moderate aortic stenosis. Regurgitant fraction 14%. Mild aortic regurgitation. Pulmonic Valve: There is no significant regurgitation. Regurgitant fraction 2%. Tricuspid Valve: There is mild central regurgitation. Regurgitant fraction 15%. Mitral Valve: There is mild central regurgitation. Regurgitant fraction 16%. 6. Mild pericardial thickening of the basal inferior RV (4 mm). No pericardial effusion. No pericardial hyper-enhancement. 7. Grossly, no extracardiac findings. Recommended dedicated study if concerned for non-cardiac pathology. IMPRESSION: Modified [place] Criteria is met for myocarditis. LGE pattern can also be seen in obstructive coronary disease, but in lieu of this,  with slight pericardial thickening and with lack of wall motion abnormality, study is most consistent with myocarditis. At least moderate aortic stenosis. Rudean Haskell MD Electronically Signed   By: Rudean Haskell M.D.   On: 05/11/2022 19:55   MR CARDIAC VELOCITY FLOW MAP  Result Date: 05/11/2022 CLINICAL DATA:  Clinical question of myocarditis EXAM: CARDIAC MRI TECHNIQUE: The patient was  scanned on a 1.5 Tesla GE magnet. A dedicated cardiac coil was used. Functional imaging was done using Fiesta sequences. 2,3, and 4 chamber views were done to assess for RWMA's. Modified Simpson's rule using a short axis stack was used to calculate an ejection fraction on a dedicated work Conservation officer, nature. The patient received 10 cc of Gadavist. After 10 minutes inversion recovery sequences were used to assess for infiltration and scar tissue. Flow quantification was performed 2 times during this examination with flow quantification performed at the levels of the ascending aorta above the valve, pulmonary artery above the valve. CONTRAST:  10 cc  of Gadavist FINDINGS: 1. Normal left ventricular size, with LVEDD 46 mm, and LVEDVi 53 mL/m2. Mild concentric increase in left ventricular thickness, with intraventricular septal thickness of 9 mm, posterior wall thickness of 9 mm, but myocardial mass index of 69 g/m2. Normal left ventricular systolic function (LVEF =37%). There are no regional wall motion abnormalities. Left ventricular parametric mapping notable for increase in T2 signal in the mid anterior (60 ms) with concurrent elevation in ECV signal (42%). There is late gadolinium enhancement in the left ventricular myocardium: Up to 50% endomyocardial LGE at the level of the mid anterior wall. 2. Normal right ventricular size with RVEDVI 40 mL/m2. Normal right ventricular thickness. Normal right ventricular systolic function (RVEF =62%). There are no regional wall motion abnormalities or aneurysms.  3.  Normal left and right atrial size. 4. Normal size of the aortic root, ascending aorta (39 mm aorta at the level of the PA bifurcation) and pulmonary artery. 5. Valve assessment: Aortic Valve: Valve is abnormally thickened. 2D area 1.36 cm2. At least moderate aortic stenosis. Regurgitant fraction 14%. Mild aortic regurgitation. Pulmonic Valve: There is no significant regurgitation. Regurgitant fraction 2%. Tricuspid Valve: There is mild central regurgitation. Regurgitant fraction 15%. Mitral Valve: There is mild central regurgitation. Regurgitant fraction 16%. 6. Mild pericardial thickening of the basal inferior RV (4 mm). No pericardial effusion. No pericardial hyper-enhancement. 7. Grossly, no extracardiac findings. Recommended dedicated study if concerned for non-cardiac pathology. IMPRESSION: Modified [place] Criteria is met for myocarditis. LGE pattern can also be seen in obstructive coronary disease, but in lieu of this, with slight pericardial thickening and with lack of wall motion abnormality, study is most consistent with myocarditis. At least moderate aortic stenosis. Rudean Haskell MD Electronically Signed   By: Rudean Haskell M.D.   On: 05/11/2022 19:55   MR CARDIAC VELOCITY FLOW MAP  Result Date: 05/11/2022 CLINICAL DATA:  Clinical question of myocarditis EXAM: CARDIAC MRI TECHNIQUE: The patient was scanned on a 1.5 Tesla GE magnet. A dedicated cardiac coil was used. Functional imaging was done using Fiesta sequences. 2,3, and 4 chamber views were done to assess for RWMA's. Modified Simpson's rule using a short axis stack was used to calculate an ejection fraction on a dedicated work Conservation officer, nature. The patient received 10 cc of Gadavist. After 10 minutes inversion recovery sequences were used to assess for infiltration and scar tissue. Flow quantification was performed 2 times during this examination with flow quantification performed at the levels of the  ascending aorta above the valve, pulmonary artery above the valve. CONTRAST:  10 cc  of Gadavist FINDINGS: 1. Normal left ventricular size, with LVEDD 46 mm, and LVEDVi 53 mL/m2. Mild concentric increase in left ventricular thickness, with intraventricular septal thickness of 9 mm, posterior wall thickness of 9 mm, but myocardial mass index of 69 g/m2. Normal left  ventricular systolic function (LVEF =46%). There are no regional wall motion abnormalities. Left ventricular parametric mapping notable for increase in T2 signal in the mid anterior (60 ms) with concurrent elevation in ECV signal (42%). There is late gadolinium enhancement in the left ventricular myocardium: Up to 50% endomyocardial LGE at the level of the mid anterior wall. 2. Normal right ventricular size with RVEDVI 40 mL/m2. Normal right ventricular thickness. Normal right ventricular systolic function (RVEF =27%). There are no regional wall motion abnormalities or aneurysms. 3.  Normal left and right atrial size. 4. Normal size of the aortic root, ascending aorta (39 mm aorta at the level of the PA bifurcation) and pulmonary artery. 5. Valve assessment: Aortic Valve: Valve is abnormally thickened. 2D area 1.36 cm2. At least moderate aortic stenosis. Regurgitant fraction 14%. Mild aortic regurgitation. Pulmonic Valve: There is no significant regurgitation. Regurgitant fraction 2%. Tricuspid Valve: There is mild central regurgitation. Regurgitant fraction 15%. Mitral Valve: There is mild central regurgitation. Regurgitant fraction 16%. 6. Mild pericardial thickening of the basal inferior RV (4 mm). No pericardial effusion. No pericardial hyper-enhancement. 7. Grossly, no extracardiac findings. Recommended dedicated study if concerned for non-cardiac pathology. IMPRESSION: Modified [place] Criteria is met for myocarditis. LGE pattern can also be seen in obstructive coronary disease, but in lieu of this, with slight pericardial thickening and with  lack of wall motion abnormality, study is most consistent with myocarditis. At least moderate aortic stenosis. Rudean Haskell MD Electronically Signed   By: Rudean Haskell M.D.   On: 05/11/2022 19:55   ECHOCARDIOGRAM COMPLETE  Result Date: 05/11/2022    ECHOCARDIOGRAM REPORT   Patient Name:   Jonathon Bray Date of Exam: 05/11/2022 Medical Rec #:  [id number]         Height:       65.0 in Accession #:    [id number]        Weight:       214.0 lb Date of Birth:  11/30/1965         BSA:          2.036 m Patient Age:    47 years          BP:           139/92 mmHg Patient Gender: M                 HR:           90 bpm. Exam Location:  Inpatient Procedure: 2D Echo, Cardiac Doppler, Color Doppler and Intracardiac            Opacification Agent Indications:     Other-Full Diagnosis List  History:         Patient has prior history of Echocardiogram examinations, most                  recent 03/04/2022. Previous Myocardial Infarction; Risk                  Factors:Hypertension and Diabetes.  Sonographer:     Ronny Flurry Referring Phys:  [id number] Adrian Prows Diagnosing Phys: Rex Kras DO IMPRESSIONS  1. Left ventricular ejection fraction, by estimation, is 60 to 65%. The left ventricle has normal function. The left ventricle has no regional wall motion abnormalities. Left ventricular diastolic parameters were normal.  2. Right ventricular systolic function is normal. The right ventricular size is normal.  3. The mitral valve is grossly normal. Trivial mitral valve regurgitation. No evidence of mitral  stenosis.  4. The aortic valve is calcified. Aortic valve regurgitation is not visualized. Moderate to severe aortic valve stenosis (peak velocity 3.94ms, MG 37.335mg, DI 0.20).  5. The inferior vena cava is normal in size with greater than 50% respiratory variability, suggesting right atrial pressure of 3 mmHg.  6. Rhythm strip during this exam demonstrates normal sinus rhythm. Comparison(s): A prior study was  performed on 02/16/2022. LVEF 60-65%, moderate AS (peak velocity 3.5642m MG 49m6m. FINDINGS  Left Ventricle: Left ventricular ejection fraction, by estimation, is 60 to 65%. The left ventricle has normal function. The left ventricle has no regional wall motion abnormalities. Definity contrast agent was given IV to delineate the left ventricular  endocardial borders. The left ventricular internal cavity size was normal in size. There is no left ventricular hypertrophy. Left ventricular diastolic parameters were normal. Right Ventricle: The right ventricular size is normal. No increase in right ventricular wall thickness. Right ventricular systolic function is normal. Left Atrium: Left atrial size was normal in size. Right Atrium: Right atrial size was normal in size. Pericardium: There is no evidence of pericardial effusion. Mitral Valve: The mitral valve is grossly normal. Trivial mitral valve regurgitation. No evidence of mitral valve stenosis. Tricuspid Valve: The tricuspid valve is grossly normal. Tricuspid valve regurgitation is not demonstrated. No evidence of tricuspid stenosis. Aortic Valve: The aortic valve is calcified. Aortic valve regurgitation is not visualized. Moderate to severe aortic valve stenosis (peak velocity 3.35m/93mG 37.3mmHg8mI 0.20). Aortic valve mean gradient measures 37.3 mmHg. Aortic valve peak gradient measures 61.5 mmHg. Aortic valve area, by VTI measures 0.64 cm. Pulmonic Valve: The pulmonic valve was grossly normal. Pulmonic valve regurgitation is not visualized. No evidence of pulmonic stenosis. Aorta: The aortic root is normal in size and structure and the ascending aorta was not well visualized. Venous: The inferior vena cava is normal in size with greater than 50% respiratory variability, suggesting right atrial pressure of 3 mmHg. IAS/Shunts: No atrial level shunt detected by color flow Doppler. EKG: Rhythm strip during this exam demonstrates normal sinus rhythm.  LEFT  VENTRICLE PLAX 2D LVIDd:         4.60 cm   Diastology LVIDs:         3.10 cm   LV e' medial:    6.83 cm/s LV PW:         1.10 cm   LV E/e' medial:  12.9 LV IVS:        0.80 cm   LV e' lateral:   7.73 cm/s LVOT diam:     2.00 cm   LV E/e' lateral: 11.4 LV SV:         54 LV SV Index:   27 LVOT Area:     3.14 cm  RIGHT VENTRICLE             IVC RV S prime:     12.80 cm/s  IVC diam: 1.90 cm TAPSE (M-mode): 2.0 cm LEFT ATRIUM             Index        RIGHT ATRIUM           Index LA diam:        3.90 cm 1.92 cm/m   RA Area:     15.85 cm LA Vol (A2C):   54.7 ml 26.87 ml/m  RA Volume:   38.05 ml  18.69 ml/m LA Vol (A4C):   29.4 ml 14.44 ml/m LA Biplane Vol: 42.3 ml 20.78  ml/m  AORTIC VALVE AV Area (Vmax):    0.70 cm AV Area (Vmean):   0.64 cm AV Area (VTI):     0.64 cm AV Vmax:           392.00 cm/s AV Vmean:          288.667 cm/s AV VTI:            0.843 m AV Peak Grad:      61.5 mmHg AV Mean Grad:      37.3 mmHg LVOT Vmax:         87.70 cm/s LVOT Vmean:        58.740 cm/s LVOT VTI:          0.173 m LVOT/AV VTI ratio: 0.20  AORTA Ao Root diam: 3.60 cm Ao Asc diam:  3.60 cm MITRAL VALVE MV Area (PHT): 3.93 cm    SHUNTS MV Decel Time: 193 msec    Systemic VTI:  0.17 m MV E velocity: 88.40 cm/s  Systemic Diam: 2.00 cm MV A velocity: 81.90 cm/s MV E/A ratio:  1.08 Haille Pardi DO Electronically signed by Rex Kras DO Signature Date/Time: 05/11/2022/2:41:04 PM    Final    CARDIAC CATHETERIZATION  Result Date: 05/10/2022 Coronary angiography 05/10/22: Normal coronary arteries, right dominant circulation.  Unable to cross into the LV in spite of aggressive attempts. Impression: Elevated cardiac markers could be related to either pericarditis or myocarditis although EKG essentially normal.  Will repeat echocardiogram. Patient can be discharged home tomorrow if he remains stable.  No indication for aspirin.   PCV MYOCARDIAL PERFUSION WO LEXISCAN  Result Date: 04/14/2022 Exercise Sestamibi stress test 04/12/2022:  Exercise nuclear stress test was performed using Bruce protocol.  1 Day Rest and Stress images. Exercise time 4 minutes 14 seconds, achieved 6.12METS, 86% APMHR. Stress ECG negative for ischemia with rare PVCs at peak stress. Hypertensive response to exercise. Medium size, mild intensity reversible perfusion defect, involving base to mid inferior and basal inferolateral segments, suggestive of ischemia in RCA/LCX distribution. Left ventricular size normal, calculated LVEF 50% but visually appears preserved. No prior studies available for comparison. Low risk study, clinical correlation required (reduced functional capacity for age, hypertensive response to exercise, PVC at peak stress, perfusion findings as noted above).      FOLLOW UP PLANS AND APPOINTMENTS  Allergies as of 05/12/2022       Reactions   Invokana [canagliflozin] Other (See Comments)   Dizziness Syncope    Levaquin [levofloxacin] Rash   Zestril [lisinopril] Cough        Medication List     STOP taking these medications    aspirin EC 81 MG tablet   CINNAMON PO       TAKE these medications    atorvastatin 40 MG tablet Commonly known as: LIPITOR Take 1 tablet (40 mg total) by mouth at bedtime. What changed: when to take this   colchicine 0.6 MG tablet Take 1 tablet (0.6 mg total) by mouth 2 (two) times daily.   dapagliflozin propanediol 5 MG Tabs tablet Commonly known as: FARXIGA Take 1 tablet (5 mg total) by mouth daily.   HumuLIN N KwikPen 100 UNIT/ML Kiwkpen Generic drug: Insulin NPH (Human) (Isophane) Inject 30 Units into the skin in the morning and at bedtime.   indomethacin 25 MG capsule Commonly known as: INDOCIN Take 1 capsule (25 mg total) by mouth 3 (three) times daily with meals for 7 days.   indomethacin 50 MG capsule Commonly known as:  INDOCIN Take 1 capsule (50 mg total) by mouth 3 (three) times daily with meals for 7 days. Start taking on: May 20, 2022   insulin lispro 100 UNIT/ML  KwikPen Commonly known as: HUMALOG Inject 16 Units into the skin 2 (two) times daily with a meal.   losartan 50 MG tablet Commonly known as: COZAAR Take 1 tablet (50 mg total) by mouth in the morning. What changed: when to take this   metFORMIN 500 MG 24 hr tablet Commonly known as: GLUCOPHAGE-XR Take 1,000 mg by mouth 2 (two) times daily.   metoprolol succinate 50 MG 24 hr tablet Commonly known as: TOPROL-XL Take 1 tablet (50 mg total) by mouth daily. Hold if systolic blood pressure (top number) less than 100 mmHg or pulse less than 60 bpm. What changed:  medication strength how much to take when to take this additional instructions   pantoprazole 40 MG tablet Commonly known as: PROTONIX Take 1 tablet (40 mg total) by mouth daily.   pregabalin 50 MG capsule Commonly known as: LYRICA Take 1 capsule (50 mg total) by mouth 3 (three) times daily. What changed: when to take this   Trulicity 3 JE/5.6DJ Sopn Generic drug: Dulaglutide Inject 3 mg into the skin every Friday.      Discharge time: 45 minutes.   Rex Kras, [place], [facility]  Pager: [phone] Office: [phone]

## 2022-05-12 NOTE — Plan of Care (Signed)
  Problem: Cardiovascular: Goal: Ability to achieve and maintain adequate cardiovascular perfusion will improve Outcome: Progressing Goal: Vascular access site(s) Level 0-1 will be maintained Outcome: Progressing   Problem: Health Behavior/Discharge Planning: Goal: Ability to safely manage health-related needs after discharge will improve Outcome: Progressing   Problem: Education: Goal: Knowledge of General Education information will improve Description: Including pain rating scale, medication(s)/side effects and non-pharmacologic comfort measures Outcome: Progressing   Problem: Pain Managment: Goal: General experience of comfort will improve Outcome: Progressing   Problem: Safety: Goal: Ability to remain free from injury will improve Outcome: Progressing

## 2022-05-13 ENCOUNTER — Other Ambulatory Visit: Payer: Self-pay

## 2022-05-13 ENCOUNTER — Telehealth: Payer: Self-pay

## 2022-05-13 DIAGNOSIS — I214 Non-ST elevation (NSTEMI) myocardial infarction: Secondary | ICD-10-CM

## 2022-05-13 NOTE — Telephone Encounter (Signed)
Called patient to do a TOC no answer left a vm

## 2022-05-13 NOTE — Telephone Encounter (Signed)
Location of hospitalization: [place] Reason for hospitalization: chest pain/ burning. Date of discharge: 05/12/2022 Date of first communication with patient: today Person contacting patient: Me Current symptoms: no symptoms per patient, he has been resting like instructed and is currently not having any pain or SOB.  Do you understand why you were in the Hospital: Yes Questions regarding discharge instructions: None Where were you discharged to: Home Medications reviewed: Yes Allergies reviewed: Yes Dietary changes reviewed: Yes. Discussed low fat and low salt diet.  Referals reviewed: NA Activities of Daily Living: Able to with mild limitations Any transportation issues/concerns: None Any patient concerns: None Confirmed importance & date/time of Follow up appt: Yes Confirmed with patient if condition begins to worsen call. Pt was given the office number and encouraged to call back with questions or concerns: Yes

## 2022-05-13 NOTE — Telephone Encounter (Signed)
Called patient about having labs done 48 hours before next visit at [place] lab at the main entrance of the hospital. Agreed and acknowledged understanding.

## 2022-05-18 ENCOUNTER — Telehealth: Payer: Self-pay | Admitting: Neurology

## 2022-05-18 NOTE — Telephone Encounter (Signed)
30 mins MR lumbar spine wo Dr. April Manson [facility] Josem [id number] exp. 04/29/22-06/13/22 scheduled at [facility] 2/14 at 10am

## 2022-05-19 ENCOUNTER — Ambulatory Visit: Payer: 59

## 2022-05-19 DIAGNOSIS — Z9889 Other specified postprocedural states: Secondary | ICD-10-CM | POA: Diagnosis not present

## 2022-05-19 DIAGNOSIS — M5440 Lumbago with sciatica, unspecified side: Secondary | ICD-10-CM | POA: Diagnosis not present

## 2022-05-19 DIAGNOSIS — M4306 Spondylolysis, lumbar region: Secondary | ICD-10-CM | POA: Diagnosis not present

## 2022-05-21 ENCOUNTER — Telehealth: Payer: Self-pay

## 2022-05-21 NOTE — Telephone Encounter (Signed)
These medications should not cause shortness of breath.  Reduce the metoprolol to 61m po qday.  Check daily weights and BP.   ST

## 2022-05-21 NOTE — Telephone Encounter (Signed)
Patient is calling to let you know that since leaving the hospital he was given these medications: Pantoprazole 40 mg, metoprolol 50 mg, colchicine 0.6 mg, Losartan 50 mg, Indomethacin, Farxiga 5 mg, and atorvastatin 40 mg, and has had some shortness of breath consistently when at rest and when he is walking. He says he fills that it is related to the medications, he mentioned he hadn't taken them this morning and has noticed the SOB has decreased.  Please advise and I will call him back Call back number [phone]

## 2022-05-21 NOTE — Telephone Encounter (Signed)
Called both numbers on file and couldn't leave a VM on either one.

## 2022-05-21 NOTE — Progress Notes (Signed)
Discussed L spine MRI results with with patient. I will send him to physical therapy.   Dr. April Manson

## 2022-05-24 ENCOUNTER — Telehealth: Payer: Self-pay

## 2022-05-25 ENCOUNTER — Ambulatory Visit: Payer: 59 | Admitting: Cardiology

## 2022-05-25 ENCOUNTER — Encounter: Payer: Self-pay | Admitting: Cardiology

## 2022-05-25 VITALS — BP 150/90 | HR 82 | Resp 18 | Ht 65.0 in | Wt 223.8 lb

## 2022-05-25 DIAGNOSIS — R9439 Abnormal result of other cardiovascular function study: Secondary | ICD-10-CM

## 2022-05-25 DIAGNOSIS — I401 Isolated myocarditis: Secondary | ICD-10-CM

## 2022-05-25 DIAGNOSIS — R0602 Shortness of breath: Secondary | ICD-10-CM

## 2022-05-25 DIAGNOSIS — I350 Nonrheumatic aortic (valve) stenosis: Secondary | ICD-10-CM

## 2022-05-25 DIAGNOSIS — Z794 Long term (current) use of insulin: Secondary | ICD-10-CM

## 2022-05-25 DIAGNOSIS — I10 Essential (primary) hypertension: Secondary | ICD-10-CM

## 2022-05-25 DIAGNOSIS — I77810 Thoracic aortic ectasia: Secondary | ICD-10-CM

## 2022-05-25 DIAGNOSIS — I700 Atherosclerosis of aorta: Secondary | ICD-10-CM

## 2022-05-25 MED ORDER — LOSARTAN POTASSIUM 50 MG PO TABS: 50.0000 mg | ORAL_TABLET | Freq: Every day | ORAL | 2 refills | Status: DC

## 2022-05-25 MED ORDER — METOPROLOL SUCCINATE ER 50 MG PO TB24: 50.0000 mg | ORAL_TABLET | Freq: Every morning | ORAL | 0 refills | Status: DC

## 2022-05-25 NOTE — Progress Notes (Signed)
ID:  Jonathon Bray, DOB 17-Apr-1965, MRN [id number]  PCP:  Glenda Chroman, MD  Cardiologist:  Rex Kras, DO, [facility] (established care 01/18/2022)  Date: 05/25/22 Last Office Visit: 04/23/2022  Chief Complaint  Patient presents with   Transitions Of Care    HPI  Jonathon Bray is a 57 y.o. 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   Referred to the practice for evaluation of shortness of breath.  On physical examination he was noted to have aortic stenosis murmur and underwent echocardiogram which confirmed findings.  Given his risk factors he underwent MPI which was concerning for reversible ischemia he was scheduled for outpatient angiography.  However, prior to performing outpatient angiography he presented to the ED for chest pain evaluation in February 2024 and ruled in for NSTEMI.  He did undergo angiography which noted normal epicardial coronary arteries.  Echocardiogram noted preserved LVEF with moderate to severe stenosis.  Given the precordial pain and normal angiography cardiac MRI was performed which illustrated myocarditis and at least moderate AS.  Patient's medications were uptitrated and due to myocarditis and recent NSTEMI likely due to supply demand ischemia his GDMT was uptitrated.  He now presents for follow-up.  Patient states that he has finished indomethacin.  Continues to take colchicine.  He has stopped taking beta-blockers, Farxiga, losartan.  He also complains of erectile dysfunction symptoms and thinks medications could be contributing to it.  FUNCTIONAL STATUS: No structured exercise program or daily routine.  Limited due o prior  surgery.  ALLERGIES: Allergies  Allergen Reactions   Invokana [Canagliflozin] Other (See Comments)    Dizziness Syncope    Levaquin [Levofloxacin] Rash   Zestril [Lisinopril] Cough    MEDICATION LIST PRIOR TO VISIT: Current Meds  Medication Sig   atorvastatin (LIPITOR) 40 MG tablet Take 1 tablet (40 mg total) by mouth at bedtime. (Patient taking differently: Take 40 mg by mouth daily.)   colchicine 0.6 MG tablet Take 1 tablet (0.6 mg total) by mouth 2 (two) times daily.   insulin lispro (HUMALOG) 100 UNIT/ML KwikPen Inject 16 Units into the skin 2 (two) times daily with a meal.   Insulin NPH, Human,, Isophane, (HUMULIN N KWIKPEN) 100 UNIT/ML Kiwkpen Inject 30 Units into the skin in the morning and at bedtime.   metFORMIN (GLUCOPHAGE-XR) 500 MG 24 hr tablet Take 1,000 mg by mouth 2 (two) times daily.   metoprolol succinate (TOPROL XL) 50 MG 24 hr tablet Take 1 tablet (50 mg total) by mouth in the morning. Take with or immediately following a meal.   pantoprazole (PROTONIX) 40 MG tablet Take 1 tablet (40 mg total) by mouth daily.   pregabalin (LYRICA) 50 MG capsule Take 1 capsule (50 mg total) by mouth 3 (three) times daily. (Patient taking differently: Take 50 mg by mouth 2 (two) times daily.)   [DISCONTINUED] lisinopril (ZESTRIL) 5 MG tablet Take 5 mg by mouth daily.     PAST MEDICAL HISTORY: Past Medical History:  Diagnosis Date   Aortic stenosis    Diabetes mellitus without complication ([place])    type two - [place] ON RIGHT UPPER OUTER ARM   Hypertension    PONV (postoperative nausea and vomiting)     PAST SURGICAL HISTORY: Past Surgical History:  Procedure Laterality Date   COLONOSCOPY  2020   CORONARY ANGIOGRAPHY N/A 05/10/2022   Procedure: CORONARY ANGIOGRAPHY;  Surgeon: Adrian Prows, MD;  Location: [place] [facility];  Service: Cardiovascular;  Laterality: N/A;   KYPHOPLASTY N/A 06/19/2020   Procedure: ATTEMPTED LUMBAR TWO KYPHOPLASTY;  Surgeon: Ashok Pall, MD;   Location: [place];  Service: Neurosurgery;  Laterality: N/A;    FAMILY HISTORY: The patient family history includes Diabetes in his brother, father, and mother; Heart attack in his brother and sister; Heart disease in his father; Hypertension in his sister; Other (age of onset: 70) in his brother; Other (age of onset: 49) in his brother.  SOCIAL HISTORY:  The patient  reports that he has never smoked. He has never used smokeless tobacco. He reports that he does not drink alcohol and does not use drugs.  REVIEW OF SYSTEMS: Review of Systems  Cardiovascular:  Negative for chest pain, claudication, dyspnea on exertion, irregular heartbeat, leg swelling, near-syncope, orthopnea, palpitations, paroxysmal nocturnal dyspnea and syncope.  Respiratory:  Negative for shortness of breath.   Hematologic/Lymphatic: Negative for bleeding problem.  Musculoskeletal:  Negative for muscle cramps and myalgias.  Neurological:  Negative for dizziness and light-headedness.    PHYSICAL EXAM:    05/25/2022    4:03 PM 05/25/2022    2:59 PM 05/12/2022    9:52 AM  Vitals with BMI  Height  '5\' 5"'$    Weight  223 lbs 13 oz   BMI  [id number]   Systolic [id number]  Diastolic 90 95 79  Pulse 82 57 88   Physical Exam  Constitutional: No distress.  Appears older than stated age, hemodynamically stable.   HENT:  Poor dental hygiene  Neck: No JVD present.  Cardiovascular: Normal rate, regular rhythm, S1 normal, S2 normal, intact distal pulses and normal pulses. Exam reveals no gallop, no S3 and no S4.  Murmur heard. Crescendo-decrescendo midsystolic murmur is present with a grade of 3/6 at the upper right sternal border radiating to the neck. Pulses:      Carotid pulses are  on the right side with bruit and  on the left side with bruit. Pulmonary/Chest: Effort normal and breath sounds normal. No stridor. He has no wheezes. He has no rales.  Abdominal: Soft. Bowel sounds are normal. He exhibits no distension. There is no  abdominal tenderness.  Musculoskeletal:        General: Edema (Trace bilateral) present.     Cervical back: Neck supple.  Neurological: He is alert and oriented to person, place, and time. He has intact cranial nerves (2-12).  Skin: Skin is warm and moist.   CARDIAC DATABASE: EKG: 05/25/2022: Sinus rhythm, 92 bpm, LAE, nonspecific T wave abnormality  Echocardiogram: 01/25/2014: LVEF 60%.    02/16/2022: [id number], normal diastolic function, moderate AS (MG 32, V-max 3.56 m/s) see report for additional details  05/31/2022:  1. Left ventricular ejection fraction, by estimation, is 60 to 65%. The  left ventricle has normal function. The left ventricle has no regional  wall motion abnormalities. Left ventricular diastolic parameters were  normal.   2. Right ventricular systolic function is normal. The right ventricular  size is normal.   3. The mitral valve is grossly normal. Trivial mitral valve  regurgitation. No evidence of mitral stenosis.   4. The aortic valve is calcified. Aortic valve regurgitation is not  visualized. Moderate to severe aortic valve stenosis (peak velocity  3.6ms, MG 37.360mg, DI 0.20).   5. The inferior vena cava is normal in size with greater  than 50%  respiratory variability, suggesting right atrial pressure of 3 mmHg.   6. Rhythm strip during this exam demonstrates normal sinus rhythm.   Stress Testing: Exercise Sestamibi stress test 04/12/2022: Exercise nuclear stress test was performed using Bruce protocol.  1 Day Rest and Stress images. Exercise time 4 minutes 14 seconds, achieved 6.12METS, 86% APMHR.  Stress ECG negative for ischemia with rare PVCs at peak stress.  Hypertensive response to exercise.  Medium size, mild intensity reversible perfusion defect, involving base to mid inferior and basal inferolateral segments, suggestive of ischemia in RCA/LCX distribution.  Left ventricular size normal, calculated LVEF 50% but visually appears preserved. No  prior studies available for comparison. Low risk study, clinical correlation required (reduced functional capacity for age, hypertensive response to exercise, PVC at peak stress, perfusion findings as noted above).   Coronary angiography 05/10/22:  Normal coronary arteries, right dominant circulation.  Unable to cross into the LV in spite of aggressive attempts.   Impression: Elevated cardiac markers could be related to either pericarditis or myocarditis although EKG essentially normal.  Will repeat echocardiogram.   Patient can be discharged home tomorrow if he remains stable.  No indication for aspirin.   Cardiac MRI  05/11/2022: Modified [facility] Criteria is met for myocarditis.   LGE pattern can also be seen in obstructive coronary disease, but in lieu of this, with slight pericardial thickening and with lack of wall motion abnormality, study is most consistent with myocarditis.   At least moderate aortic stenosis.  Carotid Duplex 02/16/2022: Right Carotid: The extracranial vessels were near-normal with only minimal wall thickening or plaque.  Left Carotid: The extracranial vessels were near-normal with only minimal wall thickening or plaque.  Vertebrals: Bilateral vertebral arteries demonstrate antegrade flow.  Subclavians: Normal flow hemodynamics were seen in bilateral subclavian arteries.   LABORATORY DATA:    Latest Ref Rng & Units 05/12/2022    5:32 AM 05/10/2022    1:56 PM 12/26/2020    2:07 PM  CBC  WBC 4.0 - 10.5 K/uL 11.0  12.8  11.9   Hemoglobin 13.0 - 17.0 g/dL 16.3  15.2  15.9   Hematocrit 39.0 - 52.0 % 45.4  42.9  45.0   Platelets 150 - 400 K/uL 220  199  257        Latest Ref Rng & Units 05/12/2022    5:32 AM 05/11/2022    9:00 PM 05/11/2022   10:50 AM  CMP  Glucose 70 - 99 mg/dL 165  227  289   BUN 6 - 20 mg/dL '22  23  13   '$ Creatinine 0.61 - 1.24 mg/dL 1.11  1.21  0.91   Sodium 135 - 145 mmol/L 137  136  134   Potassium 3.5 - 5.1 mmol/L 4.6  4.6  4.3    Chloride 98 - 111 mmol/L 100  99  98   CO2 22 - 32 mmol/L '25  28  26   '$ Calcium 8.9 - 10.3 mg/dL 8.9  8.9  8.8     Lipid Panel  Lab Results  Component Value Date   CHOL 116 05/10/2022   HDL 45 05/10/2022   LDLCALC 58 05/10/2022   LDLDIRECT 119 (H) 02/22/2022   TRIG 63 05/10/2022   CHOLHDL 2.6 05/10/2022    No components found for: "NTPROBNP" Recent Labs    02/22/22 1005  PROBNP 251*   Recent Labs    02/22/22 1004  TSH 1.130    BMP Recent Labs  05/11/22 1050 05/11/22 2100 05/12/22 0532  NA 134* 136 137  K 4.3 4.6 4.6  CL 98 99 100  CO2 '26 28 25  '$ GLUCOSE 289* 227* 165*  BUN 13 23* 22*  CREATININE 0.91 1.21 1.11  CALCIUM 8.8* 8.9 8.9  GFRNONAA >60 >60 >60    HEMOGLOBIN A1C Lab Results  Component Value Date   HGBA1C 9.4 (H) 05/10/2022   MPG 223.08 05/10/2022    IMPRESSION:    ICD-10-CM   1. Nonrheumatic aortic valve stenosis  I35.0 EKG 12-Lead    metoprolol succinate (TOPROL XL) 50 MG 24 hr tablet    losartan (COZAAR) 50 MG tablet    2. Subacute idiopathic myocarditis  I40.1 EKG 12-Lead    3. Shortness of breath  R06.02     4. Essential hypertension  I10 losartan (COZAAR) 50 MG tablet    5. Type 2 diabetes mellitus with hyperglycemia, with long-term current use of insulin (HCC)  E11.65    Z79.4     6. Atherosclerosis of aorta (HCC)  I70.0     7. Ascending aorta dilatation [facility])  I77.810        RECOMMENDATIONS: Jonathon Bray is a 57 y.o. 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   Nonrheumatic aortic valve stenosis Most recent echo illustrates severity of aortic stenosis to be moderate to severe. Based on cardiac MRI at least moderate AS. We  emphasized the importance of blood pressure management. Refilled losartan 50 mg p.o. daily. Patient has been very sensitive to medical therapy and at his discretion stops all medications without reaching out to the office.  I have asked him to call the office if he thinks he has been having side effects to medication so that they could be changed as opposed completely discontinued He is asked to seek medical attention if he has symptoms of syncope, CHF, anginal discomfort.  Subacute idiopathic myocarditis Based on cardiac MRI from 05/11/2022. Has completed indomethacin. Currently on colchicine. EF is preserved. No significant dysrhythmias on telemetry. Currently on beta-blocker therapy for rate control strategy/cardioprotective properties. BNP trending down prior to discharge. Respiratory panel for 20 pathogens negative. ESR and CRP were within normal limits. He was educated on being cautious and not participate in exertional activities for the next 3 months.  Shortness of breath Multifactorial Differential diagnosis includes but not limited to aortic stenosis, myocarditis, obesity, hypoventilatory syndrome, uncontrolled hypertension.  Essential hypertension Office blood pressure not well-controlled. I have asked him to keep a log of his blood pressures at home and to either review with PCP or myself. Reemphasized importance of low-salt diet.  Type 2 diabetes mellitus with hyperglycemia, with long-term current use of insulin (HCC) Glycemic control needs to be better. Most recent hemoglobin A1c 9.4. In the past he did not tolerate Invokana.  In the hospital he was given a trial of [place] and did well but since discharge has stopped taking it. 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 Currently being followed by  PCP   FINAL MEDICATION LIST END OF ENCOUNTER: Meds ordered this encounter  Medications   metoprolol succinate (TOPROL XL) 50 MG 24 hr tablet    Sig:  Take 1 tablet (50 mg total) by mouth in the morning. Take with or immediately following a meal.    Dispense:  90 tablet    Refill:  0   losartan (COZAAR) 50 MG tablet    Sig: Take 1 tablet (50 mg total) by mouth daily at 10 pm.    Dispense:  30 tablet    Refill:  2    Medications Discontinued During This Encounter  Medication Reason   dapagliflozin propanediol (FARXIGA) 5 MG TABS tablet Patient Preference   Dulaglutide (TRULICITY) 3 0000000 SOPN Patient Preference   indomethacin (INDOCIN) 50 MG capsule Patient Preference   losartan (COZAAR) 50 MG tablet Patient Preference   metoprolol succinate (TOPROL-XL) 50 MG 24 hr tablet Patient Preference   lisinopril (ZESTRIL) 5 MG tablet Change in therapy     Current Outpatient Medications:    atorvastatin (LIPITOR) 40 MG tablet, Take 1 tablet (40 mg total) by mouth at bedtime. (Patient taking differently: Take 40 mg by mouth daily.), Disp: 90 tablet, Rfl: 0   colchicine 0.6 MG tablet, Take 1 tablet (0.6 mg total) by mouth 2 (two) times daily., Disp: 180 tablet, Rfl: 0   insulin lispro (HUMALOG) 100 UNIT/ML KwikPen, Inject 16 Units into the skin 2 (two) times daily with a meal., Disp: , Rfl:    Insulin NPH, Human,, Isophane, (HUMULIN N KWIKPEN) 100 UNIT/ML Kiwkpen, Inject 30 Units into the skin in the morning and at bedtime., Disp: , Rfl:    metFORMIN (GLUCOPHAGE-XR) 500 MG 24 hr tablet, Take 1,000 mg by mouth 2 (two) times daily., Disp: , Rfl:    metoprolol succinate (TOPROL XL) 50 MG 24 hr tablet, Take 1 tablet (50 mg total) by mouth in the morning. Take with or immediately following a meal., Disp: 90 tablet, Rfl: 0   pantoprazole (PROTONIX) 40 MG tablet, Take 1 tablet (40 mg total) by mouth daily., Disp: 30 tablet, Rfl: 0   pregabalin (LYRICA) 50 MG capsule, Take 1 capsule (50 mg total) by  mouth 3 (three) times daily. (Patient taking differently: Take 50 mg by mouth 2 (two) times daily.), Disp: 90 capsule, Rfl: 3   losartan (COZAAR) 50 MG tablet, Take 1 tablet (50 mg total) by mouth daily at 10 pm., Disp: 30 tablet, Rfl: 2  Orders Placed This Encounter  Procedures   EKG 12-Lead    There are no Patient Instructions on file for this visit.   --Continue cardiac medications as reconciled in final medication list. --Return in about 3 months (around 08/23/2022) for Follow up AS. or sooner if needed. --Continue follow-up with your primary care physician regarding the management of your other chronic comorbid conditions.  Patient's questions and concerns were addressed to his satisfaction. He voices understanding of the instructions provided during this encounter.   This note was created using a voice recognition software as a result there may be grammatical errors inadvertently enclosed that do not reflect the nature of this encounter. Every attempt is made to correct such errors.  Rex Kras, [place], [facility]  Pager: [phone] Office: [phone]

## 2022-05-29 ENCOUNTER — Encounter: Payer: Self-pay | Admitting: Cardiology

## 2022-06-10 ENCOUNTER — Ambulatory Visit: Payer: 59 | Admitting: Cardiology

## 2022-06-11 ENCOUNTER — Ambulatory Visit: Payer: 59 | Attending: Neurology | Admitting: Physical Therapy

## 2022-06-11 ENCOUNTER — Ambulatory Visit: Payer: 59 | Admitting: Cardiology

## 2022-06-11 DIAGNOSIS — M79672 Pain in left foot: Secondary | ICD-10-CM | POA: Insufficient documentation

## 2022-06-11 DIAGNOSIS — Z9889 Other specified postprocedural states: Secondary | ICD-10-CM | POA: Insufficient documentation

## 2022-06-11 DIAGNOSIS — R2681 Unsteadiness on feet: Secondary | ICD-10-CM | POA: Diagnosis present

## 2022-06-11 DIAGNOSIS — M5459 Other low back pain: Secondary | ICD-10-CM | POA: Insufficient documentation

## 2022-06-11 DIAGNOSIS — M5440 Lumbago with sciatica, unspecified side: Secondary | ICD-10-CM | POA: Diagnosis not present

## 2022-06-11 DIAGNOSIS — M79671 Pain in right foot: Secondary | ICD-10-CM | POA: Insufficient documentation

## 2022-06-11 DIAGNOSIS — R2689 Other abnormalities of gait and mobility: Secondary | ICD-10-CM | POA: Insufficient documentation

## 2022-06-11 DIAGNOSIS — M4306 Spondylolysis, lumbar region: Secondary | ICD-10-CM | POA: Diagnosis not present

## 2022-06-11 DIAGNOSIS — M6281 Muscle weakness (generalized): Secondary | ICD-10-CM | POA: Diagnosis present
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
## 2022-06-14 ENCOUNTER — Encounter: Payer: Self-pay | Admitting: Physical Therapy

## 2022-06-14 ENCOUNTER — Ambulatory Visit: Payer: 59 | Admitting: Physical Therapy

## 2022-06-14 DIAGNOSIS — M6281 Muscle weakness (generalized): Secondary | ICD-10-CM

## 2022-06-14 DIAGNOSIS — M79672 Pain in left foot: Secondary | ICD-10-CM

## 2022-06-14 DIAGNOSIS — M5459 Other low back pain: Secondary | ICD-10-CM

## 2022-06-14 DIAGNOSIS — R2689 Other abnormalities of gait and mobility: Secondary | ICD-10-CM

## 2022-06-14 DIAGNOSIS — R2681 Unsteadiness on feet: Secondary | ICD-10-CM

## 2022-06-14 NOTE — Patient Instructions (Signed)
Access Code: [email or web address] URL: [email or web address] Date: 06/14/2022 Prepared by: Elease Etienne  Exercises - Supine Bridge  - 1 x daily - 4 x weekly - 2 sets - 10 reps - Supine Lower Trunk Rotation  - 1 x daily - 4 x weekly - 2 sets - 10 reps - Supine Single Knee to Chest Stretch  - 1 x daily - 4 x weekly - 1 sets - 2-3 reps - 30-45 seconds hold - Seated Sciatic Tensioner  - 1 x daily - 4 x weekly - 1 sets - 20 reps

## 2022-06-14 NOTE — Therapy (Signed)
OUTPATIENT PHYSICAL THERAPY THORACOLUMBAR TREATMENT   Patient Name: Jonathon Bray MRN: [id number] DOB:1965-05-10, 57 y.o., male Today's Date: 06/14/2022  END OF SESSION:  PT End of Session - 06/14/22 1538     Visit Number 2    Number of Visits 13   with eval   Date for PT Re-Evaluation 08/06/22    Authorization Type UHC    Progress Note Due on Visit 10    PT Start Time 1536    PT Stop Time 1611    PT Time Calculation (min) 35 min    Activity Tolerance Patient tolerated treatment well;Patient limited by pain    Behavior During Therapy WFL for tasks assessed/performed             Past Medical History:  Diagnosis Date   Aortic stenosis    Diabetes mellitus without complication ([place])    type two - [place] ON RIGHT UPPER OUTER ARM   Hypertension    PONV (postoperative nausea and vomiting)    Past Surgical History:  Procedure Laterality Date   COLONOSCOPY  2020   CORONARY ANGIOGRAPHY N/A 05/10/2022   Procedure: CORONARY ANGIOGRAPHY;  Surgeon: Adrian Prows, MD;  Location: [facility];  Service: Cardiovascular;  Laterality: N/A;   KYPHOPLASTY N/A 06/19/2020   Procedure: ATTEMPTED LUMBAR TWO KYPHOPLASTY;  Surgeon: Ashok Pall, MD;  Location: [place];  Service: Neurosurgery;  Laterality: N/A;   Patient Active Problem List   Diagnosis Date Noted   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   Nonrheumatic aortic valve stenosis 05/11/2022   Family history of premature CAD 05/11/2022   Atherosclerosis of aorta ([place]) 05/11/2022   Aortic dilatation ([place]) 05/11/2022   Hyperlipidemia associated with type 2 diabetes mellitus ([place]) 05/11/2022   Benign hypertension 05/11/2022   Non-ST elevation (NSTEMI) myocardial infarction ([place]) 05/10/2022   Hypercholesteremia 05/10/2022   Type 2 diabetes mellitus with complication, with long-term current use of insulin ([place]) 05/10/2022   NSTEMI (non-ST elevated myocardial infarction) ([place]) 05/10/2022   Abnormal cardiac enzyme level 05/10/2022    Spondylolysis, lumbar region 01/01/2021   Solitary pulmonary nodule on lung CT 04/02/2020   Orthostatic dizziness 01/24/2014   Primary hypertension 01/24/2014   Diabetes mellitus ([place]) 06/01/2012    PCP: Glenda Chroman, MD  REFERRING PROVIDER: Alric Ran, MD  REFERRING DIAG: M43.06 (ICD-10-CM) - Spondylolysis, lumbar region [phone] (ICD-10-CM) - History of lumbar surgery M54.40 (ICD-10-CM) - Acute midline low back pain with sciatica, sciatica laterality unspecified  Rationale for Evaluation and Treatment: Rehabilitation  THERAPY DIAG:  Muscle weakness (generalized)  Other abnormalities of gait and mobility  Unsteadiness on feet  Pain in both feet  Other low back pain  ONSET DATE: 05/21/2022  SUBJECTIVE:  SUBJECTIVE STATEMENT: Pt states he did a lot of heavy lifting today at work with heavy equipment.  He further states the last 2 days have been the best they've been in months for his neuropathy.  He perseverates on PMH particularly his cardiac history.  He has been applying Blue Emu to his feet 2x a day.  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 Yes: NPRS scale: 4/10 Pain location: B feet Pain description: throbbing Aggravating factors: standing for too long Relieving factors: blue emu cream, lyrica   PRECAUTIONS: None  WEIGHT BEARING RESTRICTIONS: No  FALLS:  Has patient fallen in last 6 months? No  LIVING ENVIRONMENT: Lives with: lives with their spouse Butch Penny Lives  in: House/apartment Stairs: Yes: External: 6 steps; none does have ramped entrance Has following equipment at home: Single point cane, Walker - 2 wheeled, and Ramped entry  OCCUPATION: currently working, pt states that his job involves standing on concrete all day which increases his pain;  also involves heavy lifting  but he has told them he can't do that right now  PLOF: Independent with gait, Independent with transfers, and Requires assistive device for independenceoccasionally uses the cane  PATIENT GOALS: "get rid of this pain in my feet"  NEXT MD VISIT: November 11, 2022 with Dr. April Manson  OBJECTIVE:   DIAGNOSTIC FINDINGS:  lumbar spine MRI 05/19/22 IMPRESSION:    MRI lumbar spine without contrast demonstrating: - At L3-4: Disc bulging, posterior decompression with fusion hardware, with mild bilateral foraminal stenosis. - At L5-S1: Disc bulging and facet hypertrophy with mild spinal stenosis and mild bilateral foraminal stenosis.  PATIENT SURVEYS:  Modified Oswestry 13/50  LEFS 36/80, 45% impairment  SCREENING FOR RED FLAGS: Bowel or bladder incontinence: No Spinal tumors: No Cauda equina syndrome: No Compression fracture: No Abdominal aneurysm: No  COGNITION: Overall cognitive status: Within functional limits for tasks assessed     SENSATION: Decreased in distal BLE (dorsum and plantar aspect of toes, plantar aspect of feet, N/T in 4th and 5th digits B)  POSTURE: rounded shoulders and forward head  LUMBAR ROM:   AROM eval  Flexion 50%, painful  Extension 25%, painful  Right lateral flexion 25%, painful on R side  Left lateral flexion 25%, painful on L side  Right rotation WFL, painful on L side  Left rotation WFL, painful on R side   (Blank rows = not tested)   LOWER EXTREMITY MMT:    MMT Right eval Left eval  Hip flexion 5 5  Hip extension    Hip abduction    Hip adduction    Hip internal rotation    Hip external rotation    Knee flexion 5 5  Knee  extension 5 5  Ankle dorsiflexion 5 5  Ankle plantarflexion    Ankle inversion    Ankle eversion     (Blank rows = not tested)  LUMBAR SPECIAL TESTS:  Slump test: Negative  FUNCTIONAL TESTS:     GAIT: Distance walked: various clinic distances Assistive device utilized: None Level of assistance: Modified independence (increased time) Comments: increased lateral trunk weight shift B, Trendelenburg gait, antalgic gait, wide BOS  TODAY'S TREATMENT:                                                                                                                              -  Supine bridges 2x10, no pain in low back -Lower trunk rotations 2x10 each direction -Supine Lower trunk rotations at 90-90 w/ physioball x10, pt states this is harder than initial -Supine hamstring curls 2x10 using physioball -Supine marches x20 alt LE, cued for TrA activation -Supine single knee to chest alt LE 2x45 seconds, pt reports max stretch in low back and glut max, edu on using towel behind thigh for better control and relaxed UE. -Seated sciatic nerve tensioner using chair support x20 each LE, pt reports anticipated nerve pain to the plantar aspect of foot bilaterally  PATIENT EDUCATION:  Education details:  Encouraged pt to follow-up with cardiologist about continued fluid pills and other medications as he raises concerns about these running out soon.  Initial HEP.  Edu on sciatic nerve pain and contributing factors to imbalance. Person educated: Patient Education method: Customer service manager Education comprehension: verbalized understanding, returned demonstration, and needs further education  HOME EXERCISE PROGRAM: Access Code: [email or web address] URL: [email or web address] Date: 06/14/2022 Prepared by: Elease Etienne  Exercises - Supine Bridge  - 1 x daily - 4 x weekly - 2 sets - 10 reps - Supine Lower Trunk Rotation  - 1 x daily - 4 x weekly - 2 sets - 10 reps - Supine Single  Knee to Chest Stretch  - 1 x daily - 4 x weekly - 1 sets - 2-3 reps - 30-45 seconds hold - Seated Sciatic Tensioner  - 1 x daily - 4 x weekly - 1 sets - 20 reps  ASSESSMENT:  CLINICAL IMPRESSION: Focus of skilled PT session today on establishing an initial HEP to stretch the low back and strengthen the core.  Also spent time addressing sciatic nerve pain to assist in management of LE tightness and imbalance secondary to nerve pain including neuropathy.  Pt would benefit from trial of TENS unit for pain management related to ongoing neuropathy which has shown some improvement in the most recent couple of days.  Will continue per POC.   OBJECTIVE IMPAIRMENTS: Abnormal gait, decreased balance, decreased endurance, decreased knowledge of condition, decreased mobility, difficulty walking, decreased strength, impaired perceived functional ability, impaired flexibility, postural dysfunction, and pain.   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   REHAB POTENTIAL: Fair time since onset, hx of multiple lumbar spinal surgeries  CLINICAL DECISION MAKING: Stable/uncomplicated  EVALUATION COMPLEXITY: Low   GOALS: Goals reviewed with patient? Yes  SHORT TERM GOALS: Target date: 07/02/2022  Pt will be independent with initial HEP for improved strength, balance, transfers and gait. Baseline: Goal status: INITIAL  2.  Pt will improve gait velocity to at least 3.0  ft/sec for improved gait efficiency and performance at mod I level  Baseline: 2.81 ft/sec mod I no AD (3/8) Goal status: INITIAL  3.  Pt will increase lumbar ROM by 25% in all limited directions for improved functional mobility Baseline: flexion 50%, R/L lat flex 25% (3/8) Goal status: INITIAL   LONG TERM GOALS: Target date: 07/23/2022   Pt will be independent with final HEP for improved strength, balance, transfers and gait. Baseline:  Goal status: INITIAL  2.  Pt will improve gait velocity  to at least 3.25 ft/sec for improved gait efficiency and performance at mod I level  Baseline: 2.81 ft/sec mod I no AD (3/8) Goal status: INITIAL  3.  Pt will improve 5 x STS to less than or equal to 18 seconds without UE support to demonstrate improved functional strength and transfer efficiency.  Baseline: 21.59 with BUE support (3/8) Goal status: INITIAL  4.  Pt will improve score on the Oswestry to 10/50 to demonstrate decreased disability level Baseline: 13/50 (3/8) Goal status: INITIAL  5.  Pt will improve score on the LEFS to 30/80 to demonstrate decreased disability level Baseline: 36/80 or 45% (3/8) Goal status: INITIAL   PLAN:  PT FREQUENCY: 1x/week  PT DURATION: 6 weeks  PLANNED INTERVENTIONS: Therapeutic exercises, Therapeutic activity, Neuromuscular re-education, Balance training, Gait training, Patient/Family education, Self Care, Joint mobilization, DME instructions, Aquatic Therapy, Dry Needling, Electrical stimulation, Spinal manipulation, Spinal mobilization, Cryotherapy, Moist heat, Taping, Traction, Manual therapy, and Re-evaluation.  PLAN FOR NEXT SESSION:  core strengthening/stability-try forward fold in sitting, seated windmills, STS, glut strength to support low back, aerobic exercise for low back vascular health, try TENS for peripheral neuropathy   Bary Richard, PT, DPT 06/14/2022, 4:37 PM

## 2022-06-21 ENCOUNTER — Ambulatory Visit: Payer: 59 | Admitting: Physical Therapy

## 2022-06-21 DIAGNOSIS — M79672 Pain in left foot: Secondary | ICD-10-CM

## 2022-06-21 DIAGNOSIS — R2689 Other abnormalities of gait and mobility: Secondary | ICD-10-CM

## 2022-06-21 DIAGNOSIS — M5459 Other low back pain: Secondary | ICD-10-CM

## 2022-06-21 DIAGNOSIS — M6281 Muscle weakness (generalized): Secondary | ICD-10-CM | POA: Diagnosis not present

## 2022-06-21 DIAGNOSIS — R2681 Unsteadiness on feet: Secondary | ICD-10-CM

## 2022-06-21 NOTE — Therapy (Signed)
OUTPATIENT PHYSICAL THERAPY THORACOLUMBAR TREATMENT   Patient Name: Jonathon Bray MRN: [id number] DOB:11/14/1965, 57 y.o., male Today's Date: 06/21/2022  END OF SESSION:  PT End of Session - 06/21/22 1621     Visit Number 3    Number of Visits 13   with eval   Date for PT Re-Evaluation 08/06/22    Authorization Type U[employer]    Progress Note Due on Visit 10    PT Start Time 1620   pt arrived late   PT Stop Time 1701    PT Time Calculation (min) 41 min    Activity Tolerance Patient tolerated treatment well;Patient limited by pain    Behavior During Therapy WFL for tasks assessed/performed              Past Medical History:  Diagnosis Date   Aortic stenosis    Diabetes mellitus without complication ([place])    type two - [place]   Hypertension    PONV (postoperative nausea and vomiting)    Past Surgical History:  Procedure Laterality Date   COLONOSCOPY  2020   CORONARY ANGIOGRAPHY N/A 05/10/2022   Procedure: CORONARY ANGIOGRAPHY;  Surgeon: Adrian Prows, MD;  Location: [facility];  Service: Cardiovascular;  Laterality: N/A;   KYPHOPLASTY N/A 06/19/2020   Procedure: ATTEMPTED LUMBAR TWO KYPHOPLASTY;  Surgeon: Ashok Pall, MD;  Location: [facility];  Service: Neurosurgery;  Laterality: N/A;   Patient Active Problem List   Diagnosis Date Noted   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   Nonrheumatic aortic valve stenosis 05/11/2022   Family history of premature CAD 05/11/2022   Atherosclerosis of aorta ([place]) 05/11/2022   Aortic dilatation ([place]) 05/11/2022   Hyperlipidemia associated with type 2 diabetes mellitus ([place]) 05/11/2022   Benign hypertension 05/11/2022   Non-ST elevation (NSTEMI) myocardial infarction ([place]) 05/10/2022   Hypercholesteremia 05/10/2022   Type 2 diabetes mellitus with complication, with long-term current use of insulin ([place]) 05/10/2022   NSTEMI (non-ST elevated myocardial infarction) ([place]) 05/10/2022   Abnormal cardiac  enzyme level 05/10/2022   Spondylolysis, lumbar region 01/01/2021   Solitary pulmonary nodule on lung CT 04/02/2020   Orthostatic dizziness 01/24/2014   Primary hypertension 01/24/2014   Diabetes mellitus ([place]) 06/01/2012    PCP: Glenda Chroman, MD  REFERRING PROVIDER: Alric Ran, MD  REFERRING DIAG: M43.06 (ICD-10-CM) - Spondylolysis, lumbar region [phone] (ICD-10-CM) - History of lumbar surgery M54.40 (ICD-10-CM) - Acute midline low back pain with sciatica, sciatica laterality unspecified  Rationale for Evaluation and Treatment: Rehabilitation  THERAPY DIAG:  Muscle weakness (generalized)  Other abnormalities of gait and mobility  Unsteadiness on feet  Pain in both feet  Other low back pain  ONSET DATE: 05/21/2022  SUBJECTIVE:  SUBJECTIVE STATEMENT: Pt reports the pain in his feet (stabbing) was so bad over the weekend he couldn't stand it and had to lie down all weekend. Pt reports that his pain is better today. No other acute changes. Pt has also been using "stretching" machine at home and it been helping with his symptoms.  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 Yes: NPRS scale: 4/10 Pain location: B feet Pain description: throbbing Aggravating factors: standing for too long Relieving factors: blue emu cream, lyrica   PRECAUTIONS: None  WEIGHT BEARING RESTRICTIONS: No  FALLS:  Has patient fallen in last 6 months? No  LIVING ENVIRONMENT: Lives with: lives with  their spouse Butch Penny Lives in: House/apartment Stairs: Yes: External: 6 steps; none does have ramped entrance Has following equipment at home: Single point cane, Walker - 2 wheeled, and Ramped entry  OCCUPATION: currently working, pt states that his job involves standing on concrete all day which increases his pain;  also involves heavy lifting  but he has told them he can't do that right now  PLOF: Independent with gait, Independent with transfers, and Requires assistive device for independenceoccasionally uses the cane  PATIENT GOALS: "get rid of this pain in my feet"  NEXT MD VISIT: November 11, 2022 with Dr. April Manson  OBJECTIVE:   DIAGNOSTIC FINDINGS:  lumbar spine MRI 05/19/22 IMPRESSION:    MRI lumbar spine without contrast demonstrating: - At L3-4: Disc bulging, posterior decompression with fusion hardware, with mild bilateral foraminal stenosis. - At L5-S1: Disc bulging and facet hypertrophy with mild spinal stenosis and mild bilateral foraminal stenosis.  PATIENT SURVEYS:  Modified Oswestry 13/50  LEFS 36/80, 45% impairment  SCREENING FOR RED FLAGS: Bowel or bladder incontinence: No Spinal tumors: No Cauda equina syndrome: No Compression fracture: No Abdominal aneurysm: No  COGNITION: Overall cognitive status: Within functional limits for tasks assessed     SENSATION: Decreased in distal BLE (dorsum and plantar aspect of toes, plantar aspect of feet, N/T in 4th and 5th digits B)  POSTURE: rounded shoulders and forward head  LUMBAR ROM:   AROM eval  Flexion 50%, painful  Extension 25%, painful  Right lateral flexion 25%, painful on R side  Left lateral flexion 25%, painful on L side  Right rotation WFL, painful on L side  Left rotation WFL, painful on R side   (Blank rows = not tested)   LOWER EXTREMITY MMT:    MMT Right eval Left eval  Hip flexion 5 5  Hip extension    Hip abduction    Hip adduction    Hip internal rotation    Hip external rotation     Knee flexion 5 5  Knee extension 5 5  Ankle dorsiflexion 5 5  Ankle plantarflexion    Ankle inversion    Ankle eversion     (Blank rows = not tested)   TODAY'S TREATMENT:  Electrical Stimulation: For pain management of peripheral neuropathy: TENS level 25 to dorsum of B feet x 10 min  Performed simultaneously: TENS level 26 on plantar aspect of L foot x 10 min TENS level 32 on plantar aspect of R foot x 10 min  "Takes away pain, feels like they are asleep"--pt response following treatment. Inspected skin, no redness or other skin irritation noted following treatment this date. Provided handout for where to purchase TENS unit if pt interested.   PATIENT EDUCATION:  Education details: continue HEP, TENS Person educated: Patient Education method: Explanation, Demonstration, and Handouts Education comprehension: verbalized understanding, returned demonstration, and needs further education  HOME EXERCISE PROGRAM: Access Code: [email or web address] URL: [email or web address] Date: 06/14/2022 Prepared by: Elease Etienne  Exercises - Supine Bridge  - 1 x daily - 4 x weekly - 2 sets - 10 reps - Supine Lower Trunk Rotation  - 1 x daily - 4 x weekly - 2 sets - 10 reps - Supine Single Knee to Chest Stretch  - 1 x daily - 4 x weekly - 1 sets - 2-3 reps - 30-45 seconds hold - Seated Sciatic Tensioner  - 1 x daily - 4 x weekly - 1 sets - 20 reps  ASSESSMENT:  CLINICAL IMPRESSION: Emphasis of skilled PT session on trialing TENS for management of peripheral neuropathy pain in BLE. Pt has a decrease in pain and decreased sensitivity to touch in BLE following use of TENS this date. Will further assess use of TENS on BLE as well as low back next session with patient. Continue POC.    OBJECTIVE IMPAIRMENTS: Abnormal gait, decreased balance, decreased endurance,  decreased knowledge of condition, decreased mobility, difficulty walking, decreased strength, impaired perceived functional ability, impaired flexibility, postural dysfunction, and pain.   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   REHAB POTENTIAL: Fair time since onset, hx of multiple lumbar spinal surgeries  CLINICAL DECISION MAKING: Stable/uncomplicated  EVALUATION COMPLEXITY: Low   GOALS: Goals reviewed with patient? Yes  SHORT TERM GOALS: Target date: 07/02/2022  Pt will be independent with initial HEP for improved strength, balance, transfers and gait. Baseline: Goal status: INITIAL  2.  Pt will improve gait velocity to at least 3.0 ft/sec for improved gait efficiency and performance at mod I level  Baseline: 2.81 ft/sec mod I no AD (3/8) Goal status: INITIAL  3.  Pt will increase lumbar ROM by 25% in all limited directions for improved functional mobility Baseline: flexion 50%, R/L lat flex 25% (3/8) Goal status: INITIAL   LONG TERM GOALS: Target date: 07/23/2022   Pt will be independent with final HEP for improved strength, balance, transfers and gait. Baseline:  Goal status: INITIAL  2.  Pt will improve gait velocity to at least 3.25 ft/sec for improved gait efficiency and performance at mod I level  Baseline: 2.81 ft/sec mod I no AD (3/8) Goal status: INITIAL  3.  Pt will improve 5 x STS to less than or equal to 18 seconds  without UE support to demonstrate improved functional strength and transfer efficiency.  Baseline: 21.59 with BUE support (3/8) Goal status: INITIAL  4.  Pt  will improve score on the Oswestry to 10/50 to demonstrate decreased disability level Baseline: 13/50 (3/8) Goal status: INITIAL  5.  Pt will improve score on the LEFS to 30/80 to demonstrate decreased disability level Baseline: 36/80 or 45% (3/8) Goal status: INITIAL   PLAN:  PT FREQUENCY: 1x/week  PT DURATION: 6 weeks  PLANNED INTERVENTIONS: Therapeutic exercises, Therapeutic activity, Neuromuscular re-education, Balance training, Gait training, Patient/Family education, Self Care, Joint mobilization, DME instructions, Aquatic Therapy, Dry Needling, Electrical stimulation, Spinal manipulation, Spinal mobilization, Cryotherapy, Moist heat, Taping, Traction, Manual therapy, and Re-evaluation.  PLAN FOR NEXT SESSION:  core strengthening/stability-try forward fold in sitting, seated windmills, STS, glut strength to support low back, aerobic exercise for low back vascular health, try TENS for peripheral neuropathy in feet again and in low back (L hip region)   Excell Seltzer, PT, DPT, CSRS 06/21/2022, 5:04 PM

## 2022-06-28 ENCOUNTER — Ambulatory Visit: Payer: 59 | Admitting: Physical Therapy

## 2022-06-28 DIAGNOSIS — R2689 Other abnormalities of gait and mobility: Secondary | ICD-10-CM

## 2022-06-28 DIAGNOSIS — M6281 Muscle weakness (generalized): Secondary | ICD-10-CM

## 2022-06-28 DIAGNOSIS — R2681 Unsteadiness on feet: Secondary | ICD-10-CM

## 2022-06-28 DIAGNOSIS — M5459 Other low back pain: Secondary | ICD-10-CM

## 2022-06-28 DIAGNOSIS — M79671 Pain in right foot: Secondary | ICD-10-CM

## 2022-06-28 NOTE — Therapy (Signed)
OUTPATIENT PHYSICAL THERAPY THORACOLUMBAR TREATMENT   Patient Name: Jonathon Bray MRN: [id number] DOB:05/28/1965, 57 y.o., male Today's Date: 06/28/2022  END OF SESSION:  PT End of Session - 06/28/22 1622     Visit Number 4    Number of Visits 13   with eval   Date for PT Re-Evaluation 08/06/22    Authorization Type UHC    Progress Note Due on Visit 10    PT Start Time 1620   pt arrived late   PT Stop Time 1658    PT Time Calculation (min) 38 min    Activity Tolerance Patient tolerated treatment well    Behavior During Therapy WFL for tasks assessed/performed               Past Medical History:  Diagnosis Date   Aortic stenosis    Diabetes mellitus without complication ([place])    type two - [place] ON RIGHT UPPER OUTER ARM   Hypertension    PONV (postoperative nausea and vomiting)    Past Surgical History:  Procedure Laterality Date   COLONOSCOPY  2020   CORONARY ANGIOGRAPHY N/A 05/10/2022   Procedure: CORONARY ANGIOGRAPHY;  Surgeon: Adrian Prows, MD;  Location: [facility];  Service: Cardiovascular;  Laterality: N/A;   KYPHOPLASTY N/A 06/19/2020   Procedure: ATTEMPTED LUMBAR TWO KYPHOPLASTY;  Surgeon: Ashok Pall, MD;  Location: [facility];  Service: Neurosurgery;  Laterality: N/A;   Patient Active Problem List   Diagnosis Date Noted   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   Nonrheumatic aortic valve stenosis 05/11/2022   Family history of premature CAD 05/11/2022   Atherosclerosis of aorta ([place]) 05/11/2022   Aortic dilatation ([place]) 05/11/2022   Hyperlipidemia associated with type 2 diabetes mellitus ([place]) 05/11/2022   Benign hypertension 05/11/2022   Non-ST elevation (NSTEMI) myocardial infarction ([place]) 05/10/2022   Hypercholesteremia 05/10/2022   Type 2 diabetes mellitus with complication, with long-term current use of insulin ([place]) 05/10/2022   NSTEMI (non-ST elevated myocardial infarction) ([place]) 05/10/2022   Abnormal cardiac enzyme level 05/10/2022    Spondylolysis, lumbar region 01/01/2021   Solitary pulmonary nodule on lung CT 04/02/2020   Orthostatic dizziness 01/24/2014   Primary hypertension 01/24/2014   Diabetes mellitus ([place]) 06/01/2012    PCP: Glenda Chroman, MD  REFERRING PROVIDER: Alric Ran, MD  REFERRING DIAG: M43.06 (ICD-10-CM) - Spondylolysis, lumbar region [phone] (ICD-10-CM) - History of lumbar surgery M54.40 (ICD-10-CM) - Acute midline low back pain with sciatica, sciatica laterality unspecified  Rationale for Evaluation and Treatment: Rehabilitation  THERAPY DIAG:  Muscle weakness (generalized)  Other abnormalities of gait and mobility  Unsteadiness on feet  Pain in both feet  Other low back pain  ONSET DATE: 05/21/2022  SUBJECTIVE:  SUBJECTIVE STATEMENT: Pt reports he feels like the pain in his legs has improved from where it was last week, currently has 2/10 pain in low back. Pt reports that he wears a back brace at work when lifting. Pt reports he has been doing lots of stretching at home, walking on the treadmill, and using his stretching machine and that has been helping with his pain and mobility. Pt also wearing short socks instead of tall socks and that has been more comfortable on his feet. Pt reports he feels like he is 80% better than a month ago.  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 Yes: NPRS scale: 4/10 Pain location: B feet Pain description: throbbing Aggravating factors: standing for too  long Relieving factors: blue emu cream, lyrica   PRECAUTIONS: None  WEIGHT BEARING RESTRICTIONS: No  FALLS:  Has patient fallen in last 6 months? No  LIVING ENVIRONMENT: Lives with: lives with their spouse Butch Penny Lives in: House/apartment Stairs: Yes: External: 6 steps; none does have ramped entrance Has following equipment at home: Single point cane, Walker - 2 wheeled, and Ramped entry  OCCUPATION: currently working, pt states that his job involves standing on concrete all day which increases his pain;  also involves heavy lifting  but he has told them he can't do that right now  PLOF: Independent with gait, Independent with transfers, and Requires assistive device for independenceoccasionally uses the cane  PATIENT GOALS: "get rid of this pain in my feet"  NEXT MD VISIT: November 11, 2022 with Dr. April Manson  OBJECTIVE:   DIAGNOSTIC FINDINGS:  lumbar spine MRI 05/19/22 IMPRESSION:    MRI lumbar spine without contrast demonstrating: - At L3-4: Disc bulging, posterior decompression with fusion hardware, with mild bilateral foraminal stenosis. - At L5-S1: Disc bulging and facet hypertrophy with mild spinal stenosis and mild bilateral foraminal stenosis.  PATIENT SURVEYS:  Modified Oswestry 13/50  LEFS 36/80, 45% impairment  SCREENING FOR RED FLAGS: Bowel or bladder incontinence: No Spinal tumors: No Cauda equina syndrome: No Compression fracture: No Abdominal aneurysm: No  COGNITION: Overall cognitive status: Within functional limits for tasks assessed     SENSATION: Decreased in distal BLE (dorsum and plantar aspect of toes, plantar aspect of feet, N/T in 4th and 5th digits B)  POSTURE: rounded shoulders and forward head  LUMBAR ROM:   AROM eval 06/28/22  Flexion 50%, painful WFL  Extension 25%, painful WFL  Right lateral flexion 25%, painful on R side 25%, stiff but not painful  Left lateral flexion 25%, painful on L side 25%, stiff but not painful  Right rotation  WFL, painful on L side WFL, stiff  Left rotation WFL, painful on R side WFL, stiff   (Blank rows = not tested)   LOWER EXTREMITY MMT:    MMT Right eval Left eval  Hip flexion 5 5  Hip extension    Hip abduction    Hip adduction    Hip internal rotation    Hip external rotation    Knee flexion 5 5  Knee extension 5 5  Ankle dorsiflexion 5 5  Ankle plantarflexion    Ankle inversion    Ankle eversion     (Blank rows = not tested)   TODAY'S TREATMENT:  Electrical Stimulation: For pain management of chronic back pain TENS level 15 to R lumbar paraspinals/QL and L lumbar paraspinals/QL simultaneously x 10 min  Pt reports feeling less stiff following TENS treatment this session. Inspected skin, no redness or other skin irritation noted following treatment this date. Pt still has handout for where to purchase TENS unit, plans to purchase before next session.   TherAct:  [facility] PT Assessment - 06/28/22 1632       Ambulation/Gait   Gait velocity 32.8 ft over 8.9 sec = 3.69 ft/sec            Reassed lumbar AROM, see table above  TherEx Seated windmills x 10 reps  Added to HEP, see bolded below  PATIENT EDUCATION:  Education details: continue HEP, TENS Person educated: Patient Education method: Explanation, Demonstration, and Handouts Education comprehension: verbalized understanding, returned demonstration, and needs further education  HOME EXERCISE PROGRAM: Access Code: [email or web address] URL: [email or web address] Date: 06/14/2022 Prepared by: Elease Etienne  Exercises - Supine Bridge  - 1 x daily - 4 x weekly - 2 sets - 10 reps - Supine Lower Trunk Rotation  - 1 x daily - 4 x weekly - 2 sets - 10 reps - Supine Single Knee to Chest Stretch  - 1 x daily - 4 x weekly - 1 sets - 2-3 reps - 30-45 seconds hold - Seated Sciatic Tensioner   - 1 x daily - 4 x weekly - 1 sets - 20 reps - Seated Windmill Trunk Rotation Stretch  - 1 x daily - 4 x weekly - 1 sets - 10 reps  ASSESSMENT:  CLINICAL IMPRESSION: Emphasis of skilled PT session on reassessing gait speed and lumbar ROM for STG assessment. Also performed TENs to low back paraspinals/QL. Pt has met 2/3 STG due to being independent with his initial HEP and improving his gait speed from 2.81 ft/sec initially to 3.69 ft/sec this date. He has improved his lumbar ROM in all planes except for with lateral flexion but no longer has pain in his back with lumbar ROM which is an improvement from where he was at initial evaluation. Pt has some relief of stiffness following treatment this date. Pt continues to benefit from skilled therapy services to address chronic back pain, BLE peripheral neuropathy, and improve his overall functional level. Continue POC.   OBJECTIVE IMPAIRMENTS: Abnormal gait, decreased balance, decreased endurance, decreased knowledge of condition, decreased mobility, difficulty walking, decreased strength, impaired perceived functional ability, impaired flexibility, postural dysfunction, and pain.   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   REHAB POTENTIAL: Fair time since onset, hx of multiple lumbar spinal surgeries  CLINICAL DECISION MAKING: Stable/uncomplicated  EVALUATION COMPLEXITY: Low   GOALS: Goals reviewed with  patient? Yes  SHORT TERM GOALS: Target date: 07/02/2022  Pt will be independent with initial HEP for improved strength, balance, transfers and gait. Baseline: Goal status: MET  2.  Pt will improve gait velocity to at least 3.0 ft/sec for improved gait efficiency and performance at mod I level  Baseline: 2.81 ft/sec mod I no AD (3/8), 3.69 ft/sec mod I no AD (3/25) Goal status: MET  3.  Pt will increase lumbar ROM by 25% in all limited directions for improved functional mobility Baseline: flexion 50%, R/L lat flex 25% (3/8), flexion WFL and R/L lat flexion 25% (3/25) Goal status: IN PROGRESS   LONG TERM GOALS: Target date: 07/23/2022   Pt will be independent with final HEP for improved strength, balance, transfers and gait. Baseline:  Goal status: INITIAL  2.  Pt will improve gait velocity to at least 3.25 ft/sec for improved gait efficiency and performance at mod I level  Baseline: 2.81 ft/sec mod I no AD (3/8), 3.69 ft/sec mod I no AD (3/25) Goal status: MET  3.  Pt will improve 5 x STS to less than or equal to 18 seconds without UE support to demonstrate improved functional strength and transfer efficiency.  Baseline: 21.59 with BUE support (3/8) Goal status: INITIAL  4.  Pt will improve score on the Oswestry to 10/50 to demonstrate decreased disability level Baseline: 13/50 (3/8) Goal status: INITIAL  5.  Pt will improve score on the LEFS to 30/80 to demonstrate decreased disability level Baseline: 36/80 or 45% (3/8) Goal status: INITIAL   PLAN:  PT FREQUENCY: 1x/week  PT DURATION: 6 weeks  PLANNED INTERVENTIONS: Therapeutic exercises, Therapeutic activity, Neuromuscular re-education, Balance training, Gait training, Patient/Family education, Self Care, Joint mobilization, DME instructions, Aquatic Therapy, Dry Needling, Electrical stimulation, Spinal manipulation, Spinal mobilization, Cryotherapy, Moist heat, Taping, Traction, Manual therapy, and  Re-evaluation.  PLAN FOR NEXT SESSION:  core strengthening/stability-try forward fold in sitting, STS, glut strength to support low back, aerobic exercise for low back vascular health, set up TENS unit if pt purchases one and brings it in, possibly work on balance if pt interested   Excell Seltzer, PT, DPT, CSRS 06/28/2022, 5:00 PM

## 2022-07-05 ENCOUNTER — Ambulatory Visit: Payer: 59 | Attending: Neurology | Admitting: Physical Therapy

## 2022-07-05 DIAGNOSIS — R2681 Unsteadiness on feet: Secondary | ICD-10-CM | POA: Diagnosis present

## 2022-07-05 DIAGNOSIS — R2689 Other abnormalities of gait and mobility: Secondary | ICD-10-CM | POA: Insufficient documentation

## 2022-07-05 DIAGNOSIS — M6281 Muscle weakness (generalized): Secondary | ICD-10-CM | POA: Insufficient documentation

## 2022-07-05 DIAGNOSIS — M79671 Pain in right foot: Secondary | ICD-10-CM | POA: Insufficient documentation

## 2022-07-05 DIAGNOSIS — M79672 Pain in left foot: Secondary | ICD-10-CM | POA: Diagnosis present

## 2022-07-05 DIAGNOSIS — M5459 Other low back pain: Secondary | ICD-10-CM | POA: Insufficient documentation

## 2022-07-05 NOTE — Patient Instructions (Signed)
-  Tandem walking w/ countertop support 2x10' -Backwards walking w/ countertop support 2x10' *Verbally added to HEP, pt states he does not need printout.

## 2022-07-05 NOTE — Therapy (Signed)
OUTPATIENT PHYSICAL THERAPY THORACOLUMBAR TREATMENT/DISCHARGE SUMMARY   Patient Name: Tykee Hatchel MRN: [id number] DOB:09-22-1965, 57 y.o., male Today's Date: 07/05/2022  PHYSICAL THERAPY DISCHARGE SUMMARY  Visits from Start of Care: 5  Current functional level related to goals / functional outcomes: See clinical impression statement   Remaining deficits: Impaired dynamic balance   Education / Equipment: Recommended ongoing PT for TENS setup and balance strategies.  Progress towards goals.  Obtaining medical records and setting up MyChart to provide proof of attendance for work.  Continued work on [employer] and walking regularly.  TENS safety for low back use primarily.   Patient agrees to discharge. Patient goals were met. Patient is being discharged due to the patient's request.   END OF SESSION:  PT End of Session - 07/05/22 1546     Visit Number 5    Number of Visits 13   with eval   Date for PT Re-Evaluation 08/06/22    Authorization Type [employer]    Progress Note Due on Visit 10    PT Start Time [id number]   PT ran over with pt prior.   PT Stop Time 1618    PT Time Calculation (min) 40 min    Equipment Utilized During Treatment Gait belt    Activity Tolerance Patient tolerated treatment well    Behavior During Therapy WFL for tasks assessed/performed               Past Medical History:  Diagnosis Date   Aortic stenosis    Diabetes mellitus without complication ([place])    type two - [place] ON RIGHT UPPER OUTER ARM   Hypertension    PONV (postoperative nausea and vomiting)    Past Surgical History:  Procedure Laterality Date   COLONOSCOPY  2020   CORONARY ANGIOGRAPHY N/A 05/10/2022   Procedure: CORONARY ANGIOGRAPHY;  Surgeon: Adrian Prows, MD;  Location: [facility];  Service: Cardiovascular;  Laterality: N/A;   KYPHOPLASTY N/A 06/19/2020   Procedure: ATTEMPTED LUMBAR TWO KYPHOPLASTY;  Surgeon: Ashok Pall, MD;  Location: [place];  Service: Neurosurgery;   Laterality: N/A;   Patient Active Problem List   Diagnosis Date Noted   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   Nonrheumatic aortic valve stenosis 05/11/2022   Family history of premature CAD 05/11/2022   Atherosclerosis of aorta 05/11/2022   Aortic dilatation 05/11/2022   Hyperlipidemia associated with type 2 diabetes mellitus 05/11/2022   Benign hypertension 05/11/2022   Non-ST elevation (NSTEMI) myocardial infarction 05/10/2022   Hypercholesteremia 05/10/2022   Type 2 diabetes mellitus with complication, with long-term current use of insulin 05/10/2022   NSTEMI (non-ST elevated myocardial infarction) 05/10/2022   Abnormal cardiac enzyme level 05/10/2022   Spondylolysis, lumbar region 01/01/2021   Solitary pulmonary nodule on lung CT 04/02/2020   Orthostatic dizziness 01/24/2014   Primary hypertension 01/24/2014   Diabetes mellitus 06/01/2012    PCP: Glenda Chroman, MD  REFERRING PROVIDER: Alric Ran, MD  REFERRING DIAG: M43.06 (ICD-10-CM) - Spondylolysis, lumbar region Z98.890 (ICD-10-CM) - History of lumbar surgery M54.40 (ICD-10-CM) - Acute midline low back pain with sciatica, sciatica laterality unspecified  Rationale for Evaluation and Treatment: Rehabilitation  THERAPY DIAG:  Muscle weakness (generalized)  Other abnormalities of gait and mobility  Unsteadiness on feet  Pain in both feet  Other low back pain  ONSET DATE: 05/21/2022  SUBJECTIVE:  SUBJECTIVE STATEMENT:  He is frustrated that he needs his medical records or a note to excuse him from missing work for recent PT visits.  PT instructs pt to print after visit summaries on his MyChart.  Pt initially stating he does not have access to his MyChart, PT sends email with link to set up and instructions to follow-up with pt  records as needed. Pt is putting "blue MCU" on bottoms of feet and reports this is helping his tingling.  He has purchased a TENS unit, but it has not come yet.  PT recommended keeping at least a single appt to set up TENS, but pt states he is doing exercises at home and no longer feels he needs to come.  Was informed of obtaining new referral to return, pt states understanding and says he is okay with that.  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 No   PRECAUTIONS: None  WEIGHT BEARING RESTRICTIONS: No  FALLS:  Has patient fallen in last 6 months? No  LIVING ENVIRONMENT: Lives with: lives with their spouse Butch Penny Lives in: House/apartment Stairs: Yes: External: 6 steps; none does have ramped entrance Has following equipment at home: Single point cane, Walker - 2 wheeled, and Ramped entry  OCCUPATION: currently working, pt states that his job involves standing on concrete all day which increases his pain;  also involves heavy lifting  but he has told them he can't do that right now  PLOF: Independent with gait, Independent with transfers, and Requires assistive device for independenceoccasionally uses the cane  PATIENT GOALS: "get rid of this pain in my feet"  NEXT MD VISIT: November 11, 2022 with Dr. April Manson  OBJECTIVE:   DIAGNOSTIC FINDINGS:  lumbar spine MRI 05/19/22 IMPRESSION:    MRI lumbar spine without contrast demonstrating: - At L3-4: Disc bulging, posterior decompression with fusion hardware, with mild bilateral foraminal stenosis. - At L5-S1: Disc bulging and facet hypertrophy with mild spinal stenosis and mild bilateral foraminal stenosis.  PATIENT SURVEYS:   Modified Oswestry 13/50  LEFS 36/80, 45% impairment  SCREENING FOR RED FLAGS: Bowel or bladder incontinence: No Spinal tumors: No Cauda equina syndrome: No Compression fracture: No Abdominal aneurysm: No  COGNITION: Overall cognitive status: Within functional limits for tasks assessed     SENSATION: Decreased in distal BLE (dorsum and plantar aspect of toes, plantar aspect of feet, N/T in 4th and 5th digits B)  POSTURE: rounded shoulders and forward head  LUMBAR ROM:   AROM eval 06/28/22  Flexion 50%, painful WFL  Extension 25%, painful WFL  Right lateral flexion 25%, painful on R side 25%, stiff but not painful  Left lateral flexion 25%, painful on L side 25%, stiff but not painful  Right rotation WFL, painful on L side WFL, stiff  Left rotation WFL, painful on R side WFL, stiff   (Blank rows = not tested)   LOWER EXTREMITY MMT:    MMT Right eval Left eval  Hip flexion 5 5  Hip extension    Hip abduction    Hip adduction    Hip internal rotation    Hip external rotation    Knee flexion 5 5  Knee extension 5 5  Ankle dorsiflexion 5 5  Ankle plantarflexion    Ankle inversion  Ankle eversion     (Blank rows = not tested)   TODAY'S TREATMENT:                                                                                                                              -Verbally reviewed setup for TENS w/ teachback for low back.  -Verbally reviewed HEP.   -Forward fold 2x30 seconds -5xSTS:  15.12 seconds w/o UE support -STS x10 no UE support, wide BOS -Supine single leg bridge x10 each LE, mild lateral pelvic tilt during lift, cuing for improved form -Oswestry (PT reads questions and answers to patient):  9/50 = mild perceived disability -Tandem walking w/ countertop support 2x10' -Backwards walking w/ countertop support 2x10' -LEFS:  66/80 = 82.5%  PATIENT EDUCATION:  Education details: Continue HEP-verbal additions, TENS safety and setup, and progress  towards goals. Person educated: Patient Education method: Explanation, Demonstration, and Handouts Education comprehension: verbalized understanding, returned demonstration, and needs further education  HOME EXERCISE PROGRAM: Access Code: [email or web address] URL: [email or web address] Date: 06/14/2022 Prepared by: Elease Etienne  Exercises - Supine Bridge  - 1 x daily - 4 x weekly - 2 sets - 10 reps - Supine Lower Trunk Rotation  - 1 x daily - 4 x weekly - 2 sets - 10 reps - Supine Single Knee to Chest Stretch  - 1 x daily - 4 x weekly - 1 sets - 2-3 reps - 30-45 seconds hold - Seated Sciatic Tensioner  - 1 x daily - 4 x weekly - 1 sets - 20 reps - Seated Windmill Trunk Rotation Stretch  - 1 x daily - 4 x weekly - 1 sets - 10 reps -Tandem walking w/ countertop support 2x10' -Backwards walking w/ countertop support 2x10' *Verbally added to HEP, pt states he does not need printout.  ASSESSMENT:  CLINICAL IMPRESSION: Patient requesting to discharge this visit due to difficulty getting visits excused from work and feeling he is managing well at home.  PT recommended ongoing visits to further assess response to and setup personal TENS device.  He will follow-up with PT and clinic as needed in the future for ongoing symptoms and safety with TENS upon obtaining new referral if needed.  He met 5 of 5 LTGs this session reporting significant improvement in symptoms on Oswestry and LEFS assessments though he still remains in a mild perceived disability category.  He is independent with previously established HEP with 2 additions made today for work on his dynamic balance as this remains impaired.  His LE strength appears somewhat improved though patient maintains slightly widened BOS during 5xSTS completing this assessment in 15.12 seconds without UE support.  Will discharge patient at this time as requested, but it is of note that PT was recommending continued therapy sessions to address TENS  management as well as balance impaired secondary to ongoing neuropathy in succession to successfully treating low back pain.    OBJECTIVE IMPAIRMENTS: Abnormal gait, decreased balance, decreased  endurance, decreased knowledge of condition, decreased mobility, difficulty walking, decreased strength, impaired perceived functional ability, impaired flexibility, postural dysfunction, and pain.   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   REHAB POTENTIAL: Fair time since onset, hx of multiple lumbar spinal surgeries  CLINICAL DECISION MAKING: Stable/uncomplicated  EVALUATION COMPLEXITY: Low   GOALS: Goals reviewed with patient? Yes  SHORT TERM GOALS: Target date: 07/02/2022  Pt will be independent with initial HEP for improved strength, balance, transfers and gait. Baseline: Goal status: MET  2.  Pt will improve gait velocity to at least 3.0 ft/sec for improved gait efficiency and performance at mod I level  Baseline: 2.81 ft/sec mod I no AD (3/8), 3.69 ft/sec mod I no AD (3/25) Goal status: MET  3.  Pt will increase lumbar ROM by 25% in all limited directions for improved functional mobility Baseline: flexion 50%, R/L lat flex 25% (3/8), flexion WFL and R/L lat flexion 25% (3/25) Goal status: IN PROGRESS   LONG TERM GOALS: Target date: 07/23/2022   Pt will be independent with final HEP for improved strength, balance, transfers and gait. Baseline:  Pt is independent per report (4/1) Goal status: MET  2.  Pt will improve gait velocity to at least 3.25 ft/sec for improved gait efficiency and performance at mod I level  Baseline: 2.81 ft/sec mod I no AD (3/8), 3.69 ft/sec mod I no AD (3/25) Goal status: MET  3.  Pt will improve 5 x STS to less than or equal to 18 seconds without UE support to demonstrate improved functional strength and transfer efficiency.  Baseline: 21.59 with BUE support (3/8); 15.12 seconds w/o UE support (4/1) Goal status: MET  4.  Pt will improve score on the Oswestry to 10/50 to demonstrate decreased disability level Baseline: 13/50 (3/8); 9/50 (4/1) Goal status: MET  5.  Pt will improve score on the LEFS to 30/80 to demonstrate decreased disability level Baseline: 36/80 or 45% (3/8); 66/80 or 82.5% (4/1) Goal status: MET  PLAN:  PT FREQUENCY: 1x/week  PT DURATION: 6 weeks  PLANNED INTERVENTIONS: Therapeutic exercises, Therapeutic activity, Neuromuscular re-education, Balance training, Gait training, Patient/Family education, Self Care, Joint mobilization, DME instructions, Aquatic Therapy, Dry Needling, Electrical stimulation, Spinal manipulation, Spinal mobilization, Cryotherapy, Moist heat, Taping, Traction, Manual therapy, and Re-evaluation.  PLAN FOR NEXT SESSION:  N/A   Bary Richard, PT, DPT 07/05/2022, 4:46 PM

## 2022-07-12 ENCOUNTER — Ambulatory Visit: Payer: 59 | Admitting: Physical Therapy

## 2022-07-13 ENCOUNTER — Other Ambulatory Visit: Payer: Self-pay | Admitting: Cardiology

## 2022-07-13 DIAGNOSIS — E1165 Type 2 diabetes mellitus with hyperglycemia: Secondary | ICD-10-CM

## 2022-07-13 DIAGNOSIS — I700 Atherosclerosis of aorta: Secondary | ICD-10-CM

## 2022-07-19 ENCOUNTER — Ambulatory Visit: Payer: 59 | Admitting: Physical Therapy

## 2022-08-26 ENCOUNTER — Ambulatory Visit: Payer: 59 | Admitting: Cardiology

## 2022-08-26 ENCOUNTER — Encounter: Payer: Self-pay | Admitting: Cardiology

## 2022-08-26 ENCOUNTER — Other Ambulatory Visit: Payer: Self-pay

## 2022-08-26 VITALS — BP 143/88 | HR 78 | Ht 65.0 in | Wt 223.0 lb

## 2022-08-26 DIAGNOSIS — I700 Atherosclerosis of aorta: Secondary | ICD-10-CM

## 2022-08-26 DIAGNOSIS — E1165 Type 2 diabetes mellitus with hyperglycemia: Secondary | ICD-10-CM

## 2022-08-26 DIAGNOSIS — I401 Isolated myocarditis: Secondary | ICD-10-CM

## 2022-08-26 DIAGNOSIS — I10 Essential (primary) hypertension: Secondary | ICD-10-CM

## 2022-08-26 DIAGNOSIS — R0602 Shortness of breath: Secondary | ICD-10-CM

## 2022-08-26 DIAGNOSIS — I350 Nonrheumatic aortic (valve) stenosis: Secondary | ICD-10-CM

## 2022-08-26 MED ORDER — HYDROCHLOROTHIAZIDE 25 MG PO TABS: 25.0000 mg | ORAL_TABLET | Freq: Every morning | ORAL | 0 refills | Status: DC

## 2022-08-26 MED ORDER — HYDROCHLOROTHIAZIDE 12.5 MG PO CAPS: 12.5000 mg | ORAL_CAPSULE | Freq: Every day | ORAL | 0 refills | Status: DC

## 2022-08-26 NOTE — Progress Notes (Signed)
ID:  Jonathon Bray, DOB 03-25-1966, MRN [id number]  PCP:  Ignatius Specking, MD  Cardiologist:  Tessa Lerner, DO, [facility] (established care 01/18/2022)  Date: 08/26/22 Last Office Visit: 05/25/2022  Chief Complaint  Patient presents with   Nonrheumatic aortic valve stenosis   Follow-up    3 months    HPI  Jonathon Bray is a 57 y.o. 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   Referred to the practice for evaluation of shortness of breath.  On physical examination he was noted to have aortic stenosis murmur and underwent echocardiogram which confirmed findings. Given his risk factors he underwent MPI which was concerning for reversible ischemia he was scheduled for outpatient angiography.  However, prior to his elective angiography he presented to the ED with chest pain and ruled in for NSTEMI.  He underwent angiography which noted normal epicardial coronaries and cardiac MRI which illustrated myocarditis and at least moderate AS.  He has been followed clinically.  He presents today for 64-month follow-up visit.  He denies anginal chest pain, heart failure symptoms, near-syncope/syncope.  He continues to have pins-and-needles like sensation in the dorsum of his foot bilaterally likely secondary to neuropathy given his history of diabetes.    Given his aortic stenosis and findings of myocarditis he was started on GDMT.  However in the past I discontinued beta-blockers, losartan, Farxiga.  He is made an attempt to continue current medical therapy.  He is completed the course of NSAIDs and colchicine.  Patient states that he plans to file for disability in the coming months.  FUNCTIONAL STATUS: No  structured exercise program or daily routine.   ALLERGIES: Allergies  Allergen Reactions   Invokana [Canagliflozin] Other (See Comments)    Dizziness Syncope    Levaquin [Levofloxacin] Rash   Zestril [Lisinopril] Cough    MEDICATION LIST PRIOR TO VISIT: Current Meds  Medication Sig   atorvastatin (LIPITOR) 40 MG tablet TAKE 1 TABLET BY MOUTH AT BEDTIME   hydrochlorothiazide (HYDRODIURIL) 25 MG tablet Take 1 tablet (25 mg total) by mouth every morning.   insulin lispro (HUMALOG) 100 UNIT/ML KwikPen Inject 16 Units into the skin 2 (two) times daily with a meal.   Insulin NPH, Human,, Isophane, (HUMULIN N KWIKPEN) 100 UNIT/ML Kiwkpen Inject 30 Units into the skin in the morning and at bedtime.   losartan (COZAAR) 50 MG tablet Take 1 tablet (50 mg total) by mouth daily at 10 pm.   metFORMIN (GLUCOPHAGE-XR) 500 MG 24 hr tablet Take 1,000 mg by mouth 2 (two) times daily.   pregabalin (LYRICA) 50 MG capsule Take 1 capsule (50 mg total) by mouth 3 (three) times daily. (Patient taking differently: Take 50 mg by mouth 2 (two) times daily.)   [DISCONTINUED] hydrochlorothiazide (MICROZIDE) 12.5 MG capsule Take 1 capsule (12.5 mg total) by mouth daily.     PAST MEDICAL HISTORY: Past Medical History:  Diagnosis Date   Aortic stenosis    Diabetes mellitus without complication (HCC)    type two - LIBRE SYSTEM ON RIGHT UPPER OUTER ARM   Hypertension    PONV (postoperative nausea and vomiting)     PAST SURGICAL HISTORY: Past Surgical History:  Procedure Laterality Date   COLONOSCOPY  2020   CORONARY ANGIOGRAPHY N/A 05/10/2022   Procedure: CORONARY ANGIOGRAPHY;  Surgeon: Yates Decamp, MD;  Location: [facility] INVASIVE CV LAB;  Service: Cardiovascular;  Laterality: N/A;   KYPHOPLASTY N/A 06/19/2020   Procedure: ATTEMPTED LUMBAR TWO KYPHOPLASTY;  Surgeon: Coletta Memos, MD;  Location: [facility] OR;  Service: Neurosurgery;  Laterality: N/A;    FAMILY HISTORY: The patient family history includes Diabetes in his  brother, father, and mother; Heart attack in his brother and sister; Heart disease in his father; Hypertension in his sister; Other (age of onset: 12) in his brother; Other (age of onset: 5) in his brother.  SOCIAL HISTORY:  The patient  reports that he has never smoked. He has never used smokeless tobacco. He reports that he does not drink alcohol and does not use drugs.  REVIEW OF SYSTEMS: Review of Systems  Cardiovascular:  Negative for chest pain, claudication, dyspnea on exertion, irregular heartbeat, leg swelling, near-syncope, orthopnea, palpitations, paroxysmal nocturnal dyspnea and syncope.  Respiratory:  Negative for shortness of breath.   Hematologic/Lymphatic: Negative for bleeding problem.  Musculoskeletal:  Negative for muscle cramps and myalgias.  Neurological:  Negative for dizziness and light-headedness.    PHYSICAL EXAM:    08/26/2022    3:57 PM 05/25/2022    4:03 PM 05/25/2022    2:59 PM  Vitals with BMI  Height 5\' 5"   5\' 5"   Weight 223 lbs  223 lbs 13 oz  BMI 37.11  37.24  Systolic 143 150 161  Diastolic 88 90 95  Pulse 78 82 57   Physical Exam  Constitutional: No distress.  Appears older than stated age, hemodynamically stable.   HENT:  Poor dental hygiene  Neck: No JVD present.  Cardiovascular: Normal rate, regular rhythm, S1 normal, S2 normal, intact distal pulses and normal pulses. Exam reveals no gallop, no S3 and no S4.  Murmur heard. Crescendo-decrescendo midsystolic murmur is present with a grade of 3/6 at the upper right sternal border radiating to the neck. Pulses:      Carotid pulses are  on the right side with bruit and  on the left side with bruit. Pulmonary/Chest: Effort normal and breath sounds normal. No stridor. He has no wheezes. He has no rales.  Abdominal: Soft. Bowel sounds are normal. He exhibits no distension. There is no abdominal tenderness.  Musculoskeletal:        General: Edema (Trace bilateral) present.     Cervical back: Neck  supple.  Neurological: He is alert and oriented to person, place, and time. He has intact cranial nerves (2-12).  Skin: Skin is warm and moist.   CARDIAC DATABASE: EKG: 05/25/2022: Sinus rhythm, 92 bpm, LAE, nonspecific T wave abnormality  Echocardiogram: 01/25/2014: LVEF 60%.    02/16/2022: 60-65%, normal diastolic function, moderate AS (MG 32, V-max 3.56 m/s) see report for additional details  05/31/2022:  1. Left ventricular ejection fraction, by estimation, is 60 to 65%. The  left ventricle has normal function. The left ventricle has no regional  wall motion abnormalities. Left ventricular diastolic parameters were  normal.   2. Right ventricular systolic function is normal. The right ventricular  size is normal.   3. The mitral valve is grossly normal. Trivial mitral valve  regurgitation. No evidence of mitral stenosis.   4. The aortic valve is calcified. Aortic valve regurgitation is not  visualized. Moderate to severe aortic valve stenosis (peak velocity  3.31m/s, MG 37.41mmHg, DI 0.20).   5. The inferior vena cava is normal in size with greater than 50%  respiratory variability, suggesting right atrial pressure of 3  mmHg.   6. Rhythm strip during this exam demonstrates normal sinus rhythm.   Stress Testing: Exercise Sestamibi stress test 04/12/2022: Exercise nuclear stress test was performed using Bruce protocol.  1 Day Rest and Stress images. Exercise time 4 minutes 14 seconds, achieved 6.12METS, 86% APMHR.  Stress ECG negative for ischemia with rare PVCs at peak stress.  Hypertensive response to exercise.  Medium size, mild intensity reversible perfusion defect, involving base to mid inferior and basal inferolateral segments, suggestive of ischemia in RCA/LCX distribution.  Left ventricular size normal, calculated LVEF 50% but visually appears preserved. No prior studies available for comparison. Low risk study, clinical correlation required (reduced functional capacity  for age, hypertensive response to exercise, PVC at peak stress, perfusion findings as noted above).   Coronary angiography 05/10/22:  Normal coronary arteries, right dominant circulation.  Unable to cross into the LV in spite of aggressive attempts.   Impression: Elevated cardiac markers could be related to either pericarditis or myocarditis although EKG essentially normal.  Will repeat echocardiogram.   Patient can be discharged home tomorrow if he remains stable.  No indication for aspirin.   Cardiac MRI  05/11/2022: Modified [facility] Criteria is met for myocarditis.   LGE pattern can also be seen in obstructive coronary disease, but in lieu of this, with slight pericardial thickening and with lack of wall motion abnormality, study is most consistent with myocarditis.   At least moderate aortic stenosis.  Carotid Duplex 02/16/2022: Right Carotid: The extracranial vessels were near-normal with only minimal wall thickening or plaque.  Left Carotid: The extracranial vessels were near-normal with only minimal wall thickening or plaque.  Vertebrals: Bilateral vertebral arteries demonstrate antegrade flow.  Subclavians: Normal flow hemodynamics were seen in bilateral subclavian arteries.   LABORATORY DATA:    Latest Ref Rng & Units 05/12/2022    5:32 AM 05/10/2022    1:56 PM 12/26/2020    2:07 PM  CBC  WBC 4.0 - 10.5 K/uL 11.0  12.8  11.9   Hemoglobin 13.0 - 17.0 g/dL 16.1  09.6  04.5   Hematocrit 39.0 - 52.0 % 45.4  42.9  45.0   Platelets 150 - 400 K/uL 220  199  257        Latest Ref Rng & Units 05/12/2022    5:32 AM 05/11/2022    9:00 PM 05/11/2022   10:50 AM  CMP  Glucose 70 - 99 mg/dL 409  811  914   BUN 6 - 20 mg/dL 22  23  13    Creatinine 0.61 - 1.24 mg/dL 7.82  9.56  2.13   Sodium 135 - 145 mmol/L 137  136  134   Potassium 3.5 - 5.1 mmol/L 4.6  4.6  4.3   Chloride 98 - 111 mmol/L 100  99  98   CO2 22 - 32 mmol/L 25  28  26    Calcium 8.9 - 10.3 mg/dL 8.9  8.9  8.8      Lipid Panel  Lab Results  Component Value Date   CHOL 116 05/10/2022   HDL 45 05/10/2022   LDLCALC 58 05/10/2022   LDLDIRECT 119 (H) 02/22/2022   TRIG 63 05/10/2022   CHOLHDL 2.6 05/10/2022    No components found for: "NTPROBNP" Recent Labs    02/22/22 1005  PROBNP 251*   Recent Labs    02/22/22 1004  TSH 1.130    BMP Recent Labs    05/11/22 1050 05/11/22 2100 05/12/22 0532  NA 134* 136  137  K 4.3 4.6 4.6  CL 98 99 100  CO2 26 28 25   GLUCOSE 289* 227* 165*  BUN 13 23* 22*  CREATININE 0.91 1.21 1.11  CALCIUM 8.8* 8.9 8.9  GFRNONAA >60 >60 >60    HEMOGLOBIN A1C Lab Results  Component Value Date   HGBA1C 9.4 (H) 05/10/2022   MPG 223.08 05/10/2022    IMPRESSION:    ICD-10-CM   1. Nonrheumatic aortic valve stenosis  I35.0 PCV ECHOCARDIOGRAM COMPLETE W BUBBLE    2. 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 Shortness of breath  R06.02 Basic metabolic panel    Magnesium    Pro b natriuretic peptide (BNP)    4. Essential hypertension  I10 hydrochlorothiazide (HYDRODIURIL) 25 MG tablet    Basic metabolic panel    Magnesium    Pro b natriuretic peptide (BNP)    5. Type 2 diabetes mellitus with hyperglycemia, with long-term current use of insulin (HCC)  E11.65    Z79.4     6. Atherosclerosis of aorta (HCC)  I70.0        RECOMMENDATIONS: Detravious Kirchen is a 57 y.o. 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   Nonrheumatic aortic valve stenosis Asymptomatic. Will repeat echocardiogram in 6 months to reevaluate disease progression. Patient is aware of being cognizant of symptoms of anginal chest pain, syncope/syncope, and heart failure symptoms Blood  pressures have improved compared to the past. Most recent echo illustrates severity of aortic stenosis to be moderate to severe. Based on cardiac MRI at least moderate AS.  Subacute idiopathic myocarditis Based on cardiac MRI from 05/11/2022. Has completed indomethacin & colchicine. EF is preserved. No significant dysrhythmias on telemetry. BNP trending down prior to discharge. Respiratory panel for 20 pathogens negative. ESR and CRP were within normal limits.   Shortness of breath Chronic and stable Multifactorial Differential diagnosis includes but not limited to aortic stenosis, myocarditis, obesity, hypoventilatory syndrome.  Essential hypertension Office blood pressure are within acceptable range.  Medications reconciled. Patient prefers not to be on metoprolol succinate. He has done well with hydrochlorothiazide with volume management and blood pressure management. Will increase HCTZ from 12.5 mg p.o. every morning to 25 mg p.o. every morning. Labs in 1 week to evaluate kidney function and electrolytes. Reemphasized importance of low-salt diet.  Type 2 diabetes mellitus with hyperglycemia, with long-term current use of insulin (HCC) Reemphasized importance of glycemic control. 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 Currently being followed by PCP   FINAL MEDICATION LIST END OF ENCOUNTER: Meds ordered this encounter  Medications   hydrochlorothiazide (HYDRODIURIL) 25 MG tablet    Sig: Take 1 tablet (25 mg total) by mouth every morning.    Dispense:  90 tablet    Refill:  0    Medications Discontinued During This Encounter  Medication Reason   colchicine 0.6 MG tablet    metoprolol succinate (TOPROL XL) 50 MG 24 hr tablet     pantoprazole (PROTONIX) 40 MG tablet    hydrochlorothiazide (MICROZIDE) 12.5 MG capsule Dose change     Current Outpatient Medications:  atorvastatin (LIPITOR) 40 MG tablet, TAKE 1 TABLET BY MOUTH AT BEDTIME, Disp: 90 tablet, Rfl: 0   hydrochlorothiazide (HYDRODIURIL) 25 MG tablet, Take 1 tablet (25 mg total) by mouth every morning., Disp: 90 tablet, Rfl: 0   insulin lispro (HUMALOG) 100 UNIT/ML KwikPen, Inject 16 Units into the skin 2 (two) times daily with a meal., Disp: , Rfl:    Insulin NPH, Human,, Isophane, (HUMULIN N KWIKPEN) 100 UNIT/ML Kiwkpen, Inject 30 Units into the skin in the morning and at bedtime., Disp: , Rfl:    losartan (COZAAR) 50 MG tablet, Take 1 tablet (50 mg total) by mouth daily at 10 pm., Disp: 30 tablet, Rfl: 2   metFORMIN (GLUCOPHAGE-XR) 500 MG 24 hr tablet, Take 1,000 mg by mouth 2 (two) times daily., Disp: , Rfl:    pregabalin (LYRICA) 50 MG capsule, Take 1 capsule (50 mg total) by mouth 3 (three) times daily. (Patient taking differently: Take 50 mg by mouth 2 (two) times daily.), Disp: 90 capsule, Rfl: 3   TRULICITY 3 MG/0.5ML SOPN, SMARTSIG:0.5 Milliliter(s) SUB-Q Once a Week, Disp: , Rfl:   Orders Placed This Encounter  Procedures   Basic metabolic panel   Magnesium   Pro b natriuretic peptide (BNP)   PCV ECHOCARDIOGRAM COMPLETE W BUBBLE    There are no Patient Instructions on file for this visit.   --Continue cardiac medications as reconciled in final medication list. --Return in about 3 months (around 12/06/2022) for Follow up Aortic stenosis. or sooner if needed. --Continue follow-up with your primary care physician regarding the management of your other chronic comorbid conditions.  Patient's questions and concerns were addressed to his satisfaction. He voices understanding of the instructions provided during this encounter.   This note was created using a voice recognition software as a result there may be grammatical errors inadvertently enclosed  that do not reflect the nature of this encounter. Every attempt is made to correct such errors.  Tessa Lerner, [place], [facility]  Pager:  [phone] Office: [phone]

## 2022-08-31 ENCOUNTER — Ambulatory Visit: Payer: 59 | Admitting: Cardiology

## 2022-09-13 ENCOUNTER — Ambulatory Visit: Payer: 59 | Admitting: Cardiology

## 2022-09-21 ENCOUNTER — Other Ambulatory Visit: Payer: 59

## 2022-09-30 ENCOUNTER — Other Ambulatory Visit: Payer: 59

## 2022-10-03 ENCOUNTER — Other Ambulatory Visit: Payer: Self-pay | Admitting: Cardiology

## 2022-10-03 DIAGNOSIS — I350 Nonrheumatic aortic (valve) stenosis: Secondary | ICD-10-CM

## 2022-10-08 ENCOUNTER — Other Ambulatory Visit: Payer: Self-pay | Admitting: Cardiology

## 2022-10-08 DIAGNOSIS — I700 Atherosclerosis of aorta: Secondary | ICD-10-CM

## 2022-10-08 DIAGNOSIS — Z794 Long term (current) use of insulin: Secondary | ICD-10-CM

## 2022-11-11 ENCOUNTER — Ambulatory Visit: Payer: 59 | Admitting: Neurology

## 2022-11-22 ENCOUNTER — Ambulatory Visit (HOSPITAL_COMMUNITY): Admission: RE | Admit: 2022-11-22 | Payer: 59 | Source: Ambulatory Visit

## 2022-11-22 ENCOUNTER — Other Ambulatory Visit: Payer: 59

## 2022-12-07 ENCOUNTER — Encounter: Payer: Self-pay | Admitting: Cardiology

## 2022-12-07 ENCOUNTER — Ambulatory Visit: Payer: 59 | Admitting: Cardiology

## 2022-12-07 VITALS — BP 130/84 | HR 91 | Resp 16 | Ht 65.0 in | Wt 209.6 lb

## 2022-12-07 DIAGNOSIS — E1165 Type 2 diabetes mellitus with hyperglycemia: Secondary | ICD-10-CM

## 2022-12-07 DIAGNOSIS — I77810 Thoracic aortic ectasia: Secondary | ICD-10-CM

## 2022-12-07 DIAGNOSIS — I401 Isolated myocarditis: Secondary | ICD-10-CM

## 2022-12-07 DIAGNOSIS — I350 Nonrheumatic aortic (valve) stenosis: Secondary | ICD-10-CM

## 2022-12-07 DIAGNOSIS — I700 Atherosclerosis of aorta: Secondary | ICD-10-CM

## 2022-12-07 DIAGNOSIS — I10 Essential (primary) hypertension: Secondary | ICD-10-CM

## 2022-12-07 NOTE — Progress Notes (Signed)
ID:  Jonathon Bray, DOB [birth year], MRN [id number]  PCP:  Ignatius Specking, MD  Cardiologist:  Tessa Lerner, DO, [facility] (established care 01/18/2022)  Date: 12/07/22 Last Office Visit: 05/25/2022  Chief Complaint  Patient presents with   Nonrheumatic aortic valve stenosis   Follow-up    3 month    HPI  Jonathon Bray is a 57 y.o. 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   Patient is being followed by the practice for his underlying aortic stenosis and recent NSTEMI.  During his hospitalization for NSTEMI patient did undergo cardiac MRI which illustrated myocarditis at least moderate AS.  Prior to today's office visit he was scheduled to have an echocardiogram to reevaluate the progression of aortic stenosis but this is still pending.  Since last office visit he denies anginal chest pain, heart failure symptoms, near-syncope or syncopal events.  FUNCTIONAL STATUS: No structured exercise program or daily routine.   ALLERGIES: Allergies  Allergen Reactions   Invokana [Canagliflozin] Other (See Comments)    Dizziness Syncope    Levaquin [Levofloxacin] Rash   Zestril [Lisinopril] Cough    MEDICATION LIST PRIOR TO VISIT: Current Meds  Medication Sig   atorvastatin (LIPITOR) 40 MG tablet TAKE 1 TABLET BY MOUTH AT BEDTIME   hydrochlorothiazide (HYDRODIURIL) 25 MG tablet Take 1 tablet (25 mg total) by mouth every morning.   insulin lispro (HUMALOG) 100 UNIT/ML KwikPen Inject 16 Units into the skin 2 (two) times daily with a meal.   Insulin NPH, Human,, Isophane, (HUMULIN N KWIKPEN) 100 UNIT/ML Kiwkpen Inject 30 Units into the skin in the morning and at bedtime.   losartan (COZAAR) 50  MG tablet Take 1 tablet (50 mg total) by mouth daily at 10 pm.   metFORMIN (GLUCOPHAGE-XR) 500 MG 24 hr tablet Take 1,000 mg by mouth 2 (two) times daily.   pregabalin (LYRICA) 50 MG capsule Take 1 capsule (50 mg total) by mouth 3 (three) times daily. (Patient taking differently: Take 50 mg by mouth 2 (two) times daily.)   TRULICITY 3 MG/0.5ML SOPN SMARTSIG:0.5 Milliliter(s) SUB-Q Once a Week     PAST MEDICAL HISTORY: Past Medical History:  Diagnosis Date   Aortic stenosis    Diabetes mellitus without complication (HCC)    type two - LIBRE SYSTEM ON RIGHT UPPER OUTER ARM   Hypertension    PONV (postoperative nausea and vomiting)     PAST SURGICAL HISTORY: Past Surgical History:  Procedure Laterality Date   COLONOSCOPY  2020   CORONARY ANGIOGRAPHY N/A 05/10/2022   Procedure: CORONARY ANGIOGRAPHY;  Surgeon: Yates Decamp, MD;  Location: [facility] IN[facility];  Service: Cardiovascular;  Laterality: N/A;   KYPHOPLASTY N/A 06/19/2020   Procedure: ATTEMPTED LUMBAR TWO KYPHOPLASTY;  Surgeon: Coletta Memos, MD;  Location: [facility] OR;  Service: Neurosurgery;  Laterality: N/A;    FAMILY HISTORY: The patient family history includes Diabetes in his brother, father, and mother; Heart attack in his brother and sister; Heart disease in his father; Hypertension in his sister; Other (age of onset: 34) in his brother; Other (age of onset: 5) in his brother.  SOCIAL HISTORY:  The patient  reports that he has never smoked. He has never used smokeless tobacco. He reports that he does not drink alcohol and does not use drugs.  REVIEW  OF SYSTEMS: Review of Systems  Cardiovascular:  Negative for chest pain, claudication, dyspnea on exertion, irregular heartbeat, leg swelling, near-syncope, orthopnea, palpitations, paroxysmal nocturnal dyspnea and syncope.  Respiratory:  Negative for shortness of breath.   Hematologic/Lymphatic: Negative for bleeding problem.  Musculoskeletal:  Negative for muscle cramps and myalgias.   Neurological:  Negative for dizziness and light-headedness.    PHYSICAL EXAM:    12/07/2022    3:58 PM 08/26/2022    3:57 PM 05/25/2022    4:03 PM  Vitals with BMI  Height 5\' 5"  5\' 5"    Weight 209 lbs 10 oz 223 lbs   BMI 34.88 37.11   Systolic 130 143 829  Diastolic 84 88 90  Pulse 91 78 82   Physical Exam  Constitutional: No distress.  Appears older than stated age, hemodynamically stable.   HENT:  Poor dental hygiene  Neck: No JVD present.  Cardiovascular: Normal rate, regular rhythm, S1 normal, S2 normal, intact distal pulses and normal pulses. Exam reveals no gallop, no S3 and no S4.  Murmur heard. Crescendo-decrescendo midsystolic murmur is present with a grade of 3/6 at the upper right sternal border radiating to the neck. Pulses:      Carotid pulses are  on the right side with bruit and  on the left side with bruit. Pulmonary/Chest: Effort normal and breath sounds normal. No stridor. He has no wheezes. He has no rales.  Abdominal: Soft. Bowel sounds are normal. He exhibits no distension. There is no abdominal tenderness.  Musculoskeletal:        General: Edema (Trace bilateral) present.     Cervical back: Neck supple.  Neurological: He is alert and oriented to person, place, and time. He has intact cranial nerves (2-12).  Skin: Skin is warm and moist.   CARDIAC DATABASE: EKG: December 07, 2022: Sinus rhythm, 94 bpm, without underlying ischemia injury pattern.   Echocardiogram: 01/25/2014: LVEF 60%.    02/16/2022: 60-65%, normal diastolic function, moderate AS (MG 32, V-max 3.56 m/s) see report for additional details  05/31/2022:  1. Left ventricular ejection fraction, by estimation, is 60 to 65%. The  left ventricle has normal function. The left ventricle has no regional  wall motion abnormalities. Left ventricular diastolic parameters were  normal.   2. Right ventricular systolic function is normal. The right ventricular  size is normal.   3. The mitral  valve is grossly normal. Trivial mitral valve  regurgitation. No evidence of mitral stenosis.   4. The aortic valve is calcified. Aortic valve regurgitation is not  visualized. Moderate to severe aortic valve stenosis (peak velocity  3.54m/s, MG 37.66mmHg, DI 0.20).   5. The inferior vena cava is normal in size with greater than 50%  respiratory variability, suggesting right atrial pressure of 3 mmHg.   6. Rhythm strip during this exam demonstrates normal sinus rhythm.   Stress Testing: Exercise Sestamibi stress test 04/12/2022: Exercise nuclear stress test was performed using Bruce protocol.  1 Day Rest and Stress images. Exercise time 4 minutes 14 seconds, achieved 6.12METS, 86% APMHR.  Stress ECG negative for ischemia with rare PVCs at peak stress.  Hypertensive response to exercise.  Medium size, mild intensity reversible perfusion defect, involving base to mid inferior and basal inferolateral segments, suggestive of ischemia in RCA/LCX distribution.  Left ventricular size normal, calculated LVEF 50% but visually appears preserved. No prior studies available for comparison. Low risk study, clinical correlation required (reduced functional capacity for age, hypertensive response to exercise, PVC at  peak stress, perfusion findings as noted above).   Coronary angiography 05/10/22:  Normal coronary arteries, right dominant circulation.  Unable to cross into the LV in spite of aggressive attempts.   Impression: Elevated cardiac markers could be related to either pericarditis or myocarditis although EKG essentially normal.  Will repeat echocardiogram.   Patient can be discharged home tomorrow if he remains stable.  No indication for aspirin.   Cardiac MRI  05/11/2022: Modified [facility] Criteria is met for myocarditis.   LGE pattern can also be seen in obstructive coronary disease, but in lieu of this, with slight pericardial thickening and with lack of wall motion abnormality, study is  most consistent with myocarditis.   At least moderate aortic stenosis.  Carotid Duplex 02/16/2022: Right Carotid: The extracranial vessels were near-normal with only minimal wall thickening or plaque.  Left Carotid: The extracranial vessels were near-normal with only minimal wall thickening or plaque.  Vertebrals: Bilateral vertebral arteries demonstrate antegrade flow.  Subclavians: Normal flow hemodynamics were seen in bilateral subclavian arteries.   LABORATORY DATA:    Latest Ref Rng & Units 05/12/2022    5:32 AM 05/10/2022    1:56 PM 12/26/2020    2:07 PM  CBC  WBC 4.0 - 10.5 K/uL 11.0  12.8  11.9   Hemoglobin 13.0 - 17.0 g/dL 14.7  82.9  56.2   Hematocrit 39.0 - 52.0 % 45.4  42.9  45.0   Platelets 150 - 400 K/uL 220  199  257        Latest Ref Rng & Units 05/12/2022    5:32 AM 05/11/2022    9:00 PM 05/11/2022   10:50 AM  CMP  Glucose 70 - 99 mg/dL 130  865  784   BUN 6 - 20 mg/dL 22  23  13    Creatinine 0.61 - 1.24 mg/dL 6.96  2.95  2.84   Sodium 135 - 145 mmol/L 137  136  134   Potassium 3.5 - 5.1 mmol/L 4.6  4.6  4.3   Chloride 98 - 111 mmol/L 100  99  98   CO2 22 - 32 mmol/L 25  28  26    Calcium 8.9 - 10.3 mg/dL 8.9  8.9  8.8     Lipid Panel  Lab Results  Component Value Date   CHOL 116 05/10/2022   HDL 45 05/10/2022   LDLCALC 58 05/10/2022   LDLDIRECT 119 (H) 02/22/2022   TRIG 63 05/10/2022   CHOLHDL 2.6 05/10/2022    No components found for: "NTPROBNP" Recent Labs    02/22/22 1005  PROBNP 251*   Recent Labs    02/22/22 1004  TSH 1.130    BMP Recent Labs    05/11/22 1050 05/11/22 2100 05/12/22 0532  NA 134* 136 137  K 4.3 4.6 4.6  CL 98 99 100  CO2 26 28 25   GLUCOSE 289* 227* 165*  BUN 13 23* 22*  CREATININE 0.91 1.21 1.11  CALCIUM 8.8* 8.9 8.9  GFRNONAA >60 >60 >60    HEMOGLOBIN A1C Lab Results  Component Value Date   HGBA1C 9.4 (H) 05/10/2022   MPG 223.08 05/10/2022    IMPRESSION:    ICD-10-CM   1. Nonrheumatic aortic valve  stenosis  I35.0 EKG 12-Lead    2. 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 Essential hypertension  I10     4. Type 2 diabetes mellitus with hyperglycemia, with long-term current use of insulin (HCC)  E11.65  Z79.4     5. Atherosclerosis of aorta (HCC)  I70.0     6. Ascending aorta dilatation [facility])  I77.810        RECOMMENDATIONS: Jonathon Bray is a 57 y.o. 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   Nonrheumatic aortic valve stenosis Asymptomatic. Was supposed to have an echocardiogram to evaluate for disease progression prior to his today's 73-month visit. Reemphasized its importance.  Will have this arranged ASAP. Reemphasized importance of blood pressure management.  Subacute idiopathic myocarditis As per his cardiac MRI from 05/11/2022. Has completed indomethacin & colchicine. No significant dysrhythmia on telemetry in the past. Respiratory panel for 20 pathogens negative, in the past ESR and CRP were within normal limits.  Essential hypertension Office blood pressure are within acceptable range.  Medications reconciled. Patient prefers not to be on metoprolol succinate. Has done well with hydrochlorothiazide and has tolerated the uptitration to 25 mg tablets  Type 2 diabetes mellitus with hyperglycemia, with long-term current use of insulin (HCC) Reemphasized importance of glycemic control. 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 Currently being followed by PCP   FINAL MEDICATION LIST END OF ENCOUNTER: No  orders of the defined types were placed in this encounter.   There are no discontinued medications.    Current Outpatient Medications:    atorvastatin (LIPITOR) 40 MG tablet, TAKE 1 TABLET BY MOUTH AT BEDTIME, Disp: 90 tablet, Rfl: 0   hydrochlorothiazide (HYDRODIURIL) 25 MG tablet, Take 1 tablet (25 mg total) by mouth every morning., Disp: 90 tablet, Rfl: 0   insulin lispro (HUMALOG) 100 UNIT/ML KwikPen, Inject 16 Units into the skin 2 (two) times daily with a meal., Disp: , Rfl:    Insulin NPH, Human,, Isophane, (HUMULIN N KWIKPEN) 100 UNIT/ML Kiwkpen, Inject 30 Units into the skin in the morning and at bedtime., Disp: , Rfl:    losartan (COZAAR) 50 MG tablet, Take 1 tablet (50 mg total) by mouth daily at 10 pm., Disp: 30 tablet, Rfl: 2   metFORMIN (GLUCOPHAGE-XR) 500 MG 24 hr tablet, Take 1,000 mg by mouth 2 (two) times daily., Disp: , Rfl:    pregabalin (LYRICA) 50 MG capsule, Take 1 capsule (50 mg total) by mouth 3 (three) times daily. (Patient taking differently: Take 50 mg by mouth 2 (two) times daily.), Disp: 90 capsule, Rfl: 3   TRULICITY 3 MG/0.5ML SOPN, SMARTSIG:0.5 Milliliter(s) SUB-Q Once a Week, Disp: , Rfl:   Orders Placed This Encounter  Procedures   EKG 12-Lead    There are no Patient Instructions on file for this visit.   --Continue cardiac medications as reconciled in final medication list. --No follow-ups on file. or sooner if needed. --Continue follow-up with your primary care physician regarding the management of your other chronic comorbid conditions.  Patient's questions and concerns were addressed to his satisfaction. He voices understanding of the instructions provided during this encounter.   This note was created using a voice recognition software as a result there may be grammatical errors inadvertently enclosed that do not reflect the nature of this encounter. Every attempt is made to correct such errors.  Jonathon Cavalieri Odis Hollingshead, DO, [facility]  Pager:  [phone] Office: [phone]

## 2022-12-08 ENCOUNTER — Telehealth: Payer: Self-pay | Admitting: Cardiology

## 2022-12-08 NOTE — Telephone Encounter (Signed)
Received a call from [facility] to report a critical lab.  The operator informs me that his sugars on yesterday's labs dated 12/07/2022 was 550.  I was able to reach Jonathon Bray this morning and informed him that sugars are very  uncontrolled.  I have advised him to check his morning blood sugar this his blood sugars are still to go to the evaluation and management.   Patient informs me he has an appointment with his diabetic doctor on 12/13/2022.   I advised him not to wait until December 13, 2022 as her sugars are truly elevated despite being on insulin.  Tessa Lerner, [place], [facility]  Pager:  [phone] Office: [phone]

## 2022-12-10 ENCOUNTER — Emergency Department (HOSPITAL_COMMUNITY)
Admission: EM | Admit: 2022-12-10 | Discharge: 2022-12-10 | Disposition: A | Discharge status: Home / Self Care | Payer: 59 | Attending: Student | Admitting: Student

## 2022-12-10 ENCOUNTER — Other Ambulatory Visit: Payer: Self-pay

## 2022-12-10 ENCOUNTER — Encounter (HOSPITAL_COMMUNITY): Payer: Self-pay

## 2022-12-10 DIAGNOSIS — Z7984 Long term (current) use of oral hypoglycemic drugs: Secondary | ICD-10-CM | POA: Insufficient documentation

## 2022-12-10 DIAGNOSIS — I10 Essential (primary) hypertension: Secondary | ICD-10-CM | POA: Diagnosis not present

## 2022-12-10 DIAGNOSIS — R739 Hyperglycemia, unspecified: Secondary | ICD-10-CM

## 2022-12-10 DIAGNOSIS — E1165 Type 2 diabetes mellitus with hyperglycemia: Secondary | ICD-10-CM | POA: Diagnosis not present

## 2022-12-10 DIAGNOSIS — Z794 Long term (current) use of insulin: Secondary | ICD-10-CM | POA: Insufficient documentation

## 2022-12-10 DIAGNOSIS — D72829 Elevated white blood cell count, unspecified: Secondary | ICD-10-CM | POA: Diagnosis not present

## 2022-12-10 DIAGNOSIS — E875 Hyperkalemia: Secondary | ICD-10-CM | POA: Diagnosis not present

## 2022-12-10 DIAGNOSIS — Z79899 Other long term (current) drug therapy: Secondary | ICD-10-CM | POA: Diagnosis not present

## 2022-12-10 LAB — COMPREHENSIVE METABOLIC PANEL
ALT: 21 U/L (ref 0–44)
AST: 15 U/L (ref 15–41)
Albumin: 3.5 g/dL (ref 3.5–5.0)
Alkaline Phosphatase: 63 U/L (ref 38–126)
Anion gap: 16 — ABNORMAL HIGH (ref 5–15)
BUN: 34 mg/dL — ABNORMAL HIGH (ref 6–20)
CO2: 25 mmol/L (ref 22–32)
Calcium: 9.5 mg/dL (ref 8.9–10.3)
Chloride: 95 mmol/L — ABNORMAL LOW (ref 98–111)
Creatinine, Ser: 1.23 mg/dL (ref 0.61–1.24)
GFR, Estimated: >60 mL/min (ref >60)
Glucose, Bld: 356 mg/dL — ABNORMAL HIGH (ref 70–99)
Potassium: 4.7 mmol/L (ref 3.5–5.1)
Sodium: 136 mmol/L (ref 135–145)
Total Bilirubin: 0.9 mg/dL (ref 0.3–1.2)
Total Protein: 6.9 g/dL (ref 6.5–8.1)

## 2022-12-10 LAB — CBC WITH DIFFERENTIAL/PLATELET
Abs Immature Granulocytes: 0.06 10*3/uL (ref 0.00–0.07)
Basophils Absolute: 0 10*3/uL (ref 0.0–0.1)
Basophils Relative: 0 %
Eosinophils Absolute: 0.3 10*3/uL (ref 0.0–0.5)
Eosinophils Relative: 3 %
HCT: 40.2 % (ref 39.0–52.0)
Hemoglobin: 14.3 g/dL (ref 13.0–17.0)
Immature Granulocytes: 1 %
Lymphocytes Relative: 18 %
Lymphs Abs: 2 10*3/uL (ref 0.7–4.0)
MCH: 28.5 pg (ref 26.0–34.0)
MCHC: 35.6 g/dL (ref 30.0–36.0)
MCV: 80.1 fL (ref 80.0–100.0)
Monocytes Absolute: 0.5 10*3/uL (ref 0.1–1.0)
Monocytes Relative: 4 %
Neutro Abs: 8.5 10*3/uL — ABNORMAL HIGH (ref 1.7–7.7)
Neutrophils Relative %: 74 %
Platelets: 262 10*3/uL (ref 150–400)
RBC: 5.02 MIL/uL (ref 4.22–5.81)
RDW: 12.6 % (ref 11.5–15.5)
WBC: 11.4 10*3/uL — ABNORMAL HIGH (ref 4.0–10.5)
nRBC: 0 % (ref 0.0–0.2)

## 2022-12-10 LAB — CBG MONITORING, ED
Glucose-Capillary: 323 mg/dL — ABNORMAL HIGH (ref 70–99)
Glucose-Capillary: 276 mg/dL — ABNORMAL HIGH (ref 70–99)

## 2022-12-10 MED ORDER — INSULIN ASPART 100 UNIT/ML IJ SOLN: 10.0000 [IU] | Freq: Once | INTRAMUSCULAR | Status: DC

## 2022-12-10 NOTE — Progress Notes (Signed)
Spoke with patient, he said he will go to ED sometime today.

## 2022-12-10 NOTE — ED Notes (Signed)
Per MD Kommor do not give IV insulin aspart.

## 2022-12-10 NOTE — ED Triage Notes (Signed)
Pt sent by PCP for hyperkalemia and abnormal kidney labs. Pt denies an complaints.

## 2022-12-10 NOTE — ED Provider Notes (Signed)
[place] EMERGENCY DEPARTMENT AT [facility] Provider Note  CSN: [id number] Arrival date & time: 12/10/22 4098  Chief Complaint(s) Abnormal Lab  HPI Jonathon Bray is a 57 y.o. male with PMH aortic stenosis, T2DM on insulin, HTN who presents Emergency Department for evaluation of abnormal labs.  Patient was seen by his cardiologist on 12/07/2022 where outpatient labs were performed and patient found to have a glucose of 550, potassium 5.5, mild creatinine elevation to 1.38.  Given elevated glucose and mild hyperkalemia, patient instructed to come to the emergency department for further evaluation.  Here in the emergency room, patient is completely asymptomatic with no complaints of chest pain, shortness of breath, abdominal pain, nausea, vomiting or other systemic symptoms.   Past Medical History Past Medical History:  Diagnosis Date   Aortic stenosis    Diabetes mellitus without complication (HCC)    type two - LIBRE SYSTEM ON RIGHT UPPER OUTER ARM   Hypertension    PONV (postoperative nausea and vomiting)    Patient Active Problem List   Diagnosis Date Noted   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   Nonrheumatic aortic valve stenosis 05/11/2022   Family history of premature CAD 05/11/2022   Atherosclerosis of aorta (HCC) 05/11/2022   Aortic dilatation (HCC) 05/11/2022   Hyperlipidemia associated with type 2 diabetes mellitus (HCC) 05/11/2022   Benign hypertension 05/11/2022   Non-ST elevation (NSTEMI) myocardial infarction (HCC) 05/10/2022   Hypercholesteremia 05/10/2022   Type 2 diabetes mellitus with complication, with long-term current use of insulin (HCC) 05/10/2022   NSTEMI (non-ST elevated myocardial infarction) (HCC) 05/10/2022   Abnormal cardiac enzyme level 05/10/2022   Spondylolysis, lumbar region 01/01/2021   Solitary pulmonary nodule on lung CT 04/02/2020   Orthostatic dizziness 01/24/2014   Primary hypertension 01/24/2014   Diabetes mellitus (HCC)  06/01/2012   Home Medication(s) Prior to Admission medications   Medication Sig Start Date End Date Taking? Authorizing Provider  atorvastatin (LIPITOR) 40 MG tablet TAKE 1 TABLET BY MOUTH AT BEDTIME 10/11/22   Tolia, Sunit, DO  hydrochlorothiazide (HYDRODIURIL) 25 MG tablet Take 1 tablet (25 mg total) by mouth every morning. 08/26/22 12/07/22  Tolia, Sunit, DO  insulin lispro (HUMALOG) 100 UNIT/ML KwikPen Inject 16 Units into the skin 2 (two) times daily with a meal. 08/08/17   [provider]  Insulin NPH, Human,, Isophane, (HUMULIN N KWIKPEN) 100 UNIT/ML Kiwkpen Inject 30 Units into the skin in the morning and at bedtime. 08/08/17   [provider]  losartan (COZAAR) 50 MG tablet Take 1 tablet (50 mg total) by mouth daily at 10 pm. 05/25/22 12/07/22  Tolia, Sunit, DO  metFORMIN (GLUCOPHAGE-XR) 500 MG 24 hr tablet Take 1,000 mg by mouth 2 (two) times daily. 04/25/22   [provider]  pregabalin (LYRICA) 50 MG capsule Take 1 capsule (50 mg total) by mouth 3 (three) times daily. Patient taking differently: Take 50 mg by mouth 2 (two) times daily. 04/28/22   Windell Norfolk, MD  TRULICITY 3 MG/0.5ML SOPN SMARTSIG:0.5 Milliliter(s) SUB-Q Once a Week    [provider]  Past Surgical History Past Surgical History:  Procedure Laterality Date   COLONOSCOPY  2020   CORONARY ANGIOGRAPHY N/A 05/10/2022   Procedure: CORONARY ANGIOGRAPHY;  Surgeon: Yates Decamp, MD;  Location: [facility] INV[facility];  Service: Cardiovascular;  Laterality: N/A;   KYPHOPLASTY N/A 06/19/2020   Procedure: ATTEMPTED LUMBAR TWO KYPHOPLASTY;  Surgeon: Coletta Memos, MD;  Location: [facility] OR;  Service: Neurosurgery;  Laterality: N/A;   Family History Family History  Problem Relation Age of Onset   Diabetes Mother    Diabetes Father    Heart disease Father    Heart attack Sister     Hypertension Sister    Heart attack Brother    Other Brother 57       Shot in the head   Other Brother 5       Polio   Diabetes Brother     Social History Social History   Tobacco Use   Smoking status: Never   Smokeless tobacco: Never  Vaping Use   Vaping status: Never Used  Substance Use Topics   Alcohol use: No   Drug use: No   Allergies Invokana [canagliflozin], Levaquin [levofloxacin], and Zestril [lisinopril]  Review of Systems Review of Systems  All other systems reviewed and are negative.   Physical Exam Vital Signs  I have reviewed the triage vital signs BP (!) 141/93   Pulse 99   Temp 97.9 F (36.6 C) (Oral)   Resp 18   Ht 5\' 5"  (1.651 m)   Wt 95.1 kg   SpO2 100%   BMI 34.89 kg/m   Physical Exam Constitutional:      General: He is not in acute distress.    Appearance: Normal appearance.  HENT:     Head: Normocephalic and atraumatic.     Nose: No congestion or rhinorrhea.  Eyes:     General:        Right eye: No discharge.        Left eye: No discharge.     Extraocular Movements: Extraocular movements intact.     Pupils: Pupils are equal, round, and reactive to light.  Cardiovascular:     Rate and Rhythm: Normal rate and regular rhythm.     Heart sounds: No murmur heard. Pulmonary:     Effort: No respiratory distress.     Breath sounds: No wheezing or rales.  Abdominal:     General: There is no distension.     Tenderness: There is no abdominal tenderness.  Musculoskeletal:        General: Normal range of motion.     Cervical back: Normal range of motion.  Skin:    General: Skin is warm and dry.  Neurological:     General: No focal deficit present.     Mental Status: He is alert.     ED Results and Treatments Labs (all labs ordered are listed, but only abnormal results are displayed) Labs Reviewed  CBC WITH DIFFERENTIAL/PLATELET - Abnormal; Notable for the following components:      Result Value   WBC 11.4 (*)    Neutro Abs 8.5  (*)    All other components within normal limits  CBG MONITORING, ED - Abnormal; Notable for the following components:   Glucose-Capillary 323 (*)    All other components within normal limits  COMPREHENSIVE METABOLIC PANEL  Radiology No results found.  Pertinent labs & imaging results that were available during my care of the patient were reviewed by me and considered in my medical decision making (see MDM for details).  Medications Ordered in ED Medications - No data to display                                                                                                                                   Procedures Procedures  (including critical care time)  Medical Decision Making / ED Course   This patient presents to the ED for concern of abnormal labs, this involves an extensive number of treatment options, and is a complaint that carries with it a high risk of complications and morbidity.  The differential diagnosis includes electrolyte abnormality, stress hyperglycemia, DKA, HHS  MDM: Patient seen emergency room for evaluation of abnormal labs.  Physical exam is unremarkable.  Laboratory evaluation with a mild leukocytosis to 11.4, glucose 356 but potassium is normal at 4.7 and creatinine is appropriately downtrending to 1.2.  Recheck of patient's glucose shows appropriate downtrending to 276 consistent with his current morning insulin regimen working.  With improving hyperglycemia, no symptoms, and a normal bicarb Karim, very low suspicion for DKA and at this time he is safe for discharge with outpatient follow-up.  Patient discharged with return precautions of which he voiced understanding.   Additional history obtained:  -External records from outside source obtained and reviewed including: Chart review including previous notes, labs, imaging, consultation  notes   Lab Tests: -I ordered, reviewed, and interpreted labs.   The pertinent results include:   Labs Reviewed  CBC WITH DIFFERENTIAL/PLATELET - Abnormal; Notable for the following components:      Result Value   WBC 11.4 (*)    Neutro Abs 8.5 (*)    All other components within normal limits  CBG MONITORING, ED - Abnormal; Notable for the following components:   Glucose-Capillary 323 (*)    All other components within normal limits  COMPREHENSIVE METABOLIC PANEL      EKG   EKG Interpretation Date/Time:  Friday December 10 2022 09:43:25 EDT Ventricular Rate:  93 PR Interval:  143 QRS Duration:  88 QT Interval:  340 QTC Calculation: 423 R Axis:   101  Text Interpretation: Sinus rhythm Right axis deviation Confirmed by Krishon Adkison ([id number]) on 12/10/2022 9:44:32 AM          Medicines ordered and prescription drug management: No orders of the defined types were placed in this encounter.   -I have reviewed the patients home medicines and have made adjustments as needed  Critical interventions none    Cardiac Monitoring: The patient was maintained on a cardiac monitor.  I personally viewed and interpreted the cardiac monitored which showed an underlying rhythm of: NSR  Social Determinants of Health:  Factors impacting patients care include: none   Reevaluation: After the interventions noted above, I  reevaluated the patient and found that they have :improved  Co morbidities that complicate the patient evaluation  Past Medical History:  Diagnosis Date   Aortic stenosis    Diabetes mellitus without complication (HCC)    type two - LIBRE SYSTEM ON RIGHT UPPER OUTER ARM   Hypertension    PONV (postoperative nausea and vomiting)       Dispostion: I considered admission for this patient, but at this time he does not meet inpatient criteria for admission he is safe for discharge outpatient follow-up     Final Clinical Impression(s) / ED Diagnoses Final  diagnoses:  None     @PCDICTATION @    Tatum Corl, Wyn Forster, MD 12/10/22 1137

## 2022-12-13 ENCOUNTER — Other Ambulatory Visit (HOSPITAL_COMMUNITY): Payer: 59

## 2022-12-18 ENCOUNTER — Other Ambulatory Visit: Payer: Self-pay | Admitting: Cardiology

## 2022-12-18 DIAGNOSIS — I10 Essential (primary) hypertension: Secondary | ICD-10-CM

## 2022-12-18 DIAGNOSIS — I350 Nonrheumatic aortic (valve) stenosis: Secondary | ICD-10-CM

## 2022-12-20 ENCOUNTER — Ambulatory Visit (HOSPITAL_COMMUNITY)
Admission: RE | Admit: 2022-12-20 | Discharge: 2022-12-20 | Disposition: A | Discharge status: Home / Self Care | Payer: 59 | Source: Ambulatory Visit | Attending: Cardiology | Admitting: Cardiology

## 2022-12-20 DIAGNOSIS — I350 Nonrheumatic aortic (valve) stenosis: Secondary | ICD-10-CM | POA: Diagnosis present

## 2022-12-20 NOTE — Progress Notes (Signed)
  Echocardiogram 2D Echocardiogram has been performed.  Delcie Roch 12/20/2022, 2:57 PM

## 2022-12-27 ENCOUNTER — Other Ambulatory Visit: Payer: Self-pay | Admitting: Cardiology

## 2022-12-27 DIAGNOSIS — I350 Nonrheumatic aortic (valve) stenosis: Secondary | ICD-10-CM

## 2022-12-27 LAB — ECHOCARDIOGRAM COMPLETE
AR max vel: 0.48 cm2
AV Area VTI: 0.51 cm2
AV Area mean vel: 0.46 cm2
AV Mean grad: 35 mmHg
AV Peak grad: 58.7 mmHg
Ao pk vel: 3.83 m/s
Calc EF: 43.5 %
P 1/2 time: 232 msec
S' Lateral: 3.2 cm
Single Plane A2C EF: 44.6 %
Single Plane A4C EF: 45.2 %

## 2022-12-30 ENCOUNTER — Telehealth: Payer: Self-pay | Admitting: Cardiology

## 2022-12-30 NOTE — Telephone Encounter (Signed)
Patient is aware of Echo results. Verbalized understanding

## 2022-12-30 NOTE — Telephone Encounter (Signed)
Left message to call back  

## 2022-12-30 NOTE — Telephone Encounter (Signed)
Pt returning call for echo results

## 2022-12-30 NOTE — Telephone Encounter (Signed)
Pt returning call

## 2023-01-14 ENCOUNTER — Other Ambulatory Visit: Payer: Self-pay | Admitting: Cardiology

## 2023-01-14 DIAGNOSIS — I700 Atherosclerosis of aorta: Secondary | ICD-10-CM

## 2023-01-14 DIAGNOSIS — E1165 Type 2 diabetes mellitus with hyperglycemia: Secondary | ICD-10-CM

## 2023-01-24 ENCOUNTER — Telehealth: Payer: Self-pay | Admitting: Cardiology

## 2023-01-24 DIAGNOSIS — I10 Essential (primary) hypertension: Secondary | ICD-10-CM

## 2023-01-24 NOTE — Telephone Encounter (Signed)
Left message for patient to call back  

## 2023-01-24 NOTE — Telephone Encounter (Signed)
Pt c/o medication issue:  1. Name of Medication: Fluid Pill  2. How are you currently taking this medication (dosage and times per day)?   3. Are you having a reaction (difficulty breathing--STAT)? No  4. What is your medication issue? Patient stated he was prescribed a fluid pill at his last visit. Patient stated he needed a refill, but has not been able to get one. Patient stated he had to go to the hospital on Wednesday from the swelling. Patient stated he could not remember the name of the fluid pill. I have looked at the OV notes and his medication list/history. I was unable to find a fluid pill that he may have been taking. Please advise.

## 2023-01-26 MED ORDER — HYDROCHLOROTHIAZIDE 25 MG PO TABS: 25.0000 mg | ORAL_TABLET | Freq: Every morning | ORAL | 1 refills | Status: DC

## 2023-01-26 NOTE — Telephone Encounter (Signed)
Left a message to call back.

## 2023-01-26 NOTE — Telephone Encounter (Signed)
Per Dr Odis Hollingshead last OV note:   Has done well with hydrochlorothiazide and has tolerated the uptitration to 25 mg tablets   Left a message for the pt to call back. RX sent in for HCTZ 25 mg a day.

## 2023-01-26 NOTE — Telephone Encounter (Signed)
Pt returning call

## 2023-01-26 NOTE — Telephone Encounter (Signed)
Patient returned RN's call. 

## 2023-04-21 ENCOUNTER — Ambulatory Visit: Payer: 59 | Admitting: Neurology

## 2023-04-21 ENCOUNTER — Encounter: Payer: Self-pay | Admitting: Neurology

## 2023-04-27 ENCOUNTER — Telehealth: Payer: Self-pay | Admitting: Neurology

## 2023-04-27 NOTE — Telephone Encounter (Signed)
 Pt r/s appt. Wait listed

## 2023-05-06 ENCOUNTER — Other Ambulatory Visit: Payer: Self-pay | Admitting: Cardiology

## 2023-05-06 DIAGNOSIS — I350 Nonrheumatic aortic (valve) stenosis: Secondary | ICD-10-CM

## 2023-05-06 DIAGNOSIS — I10 Essential (primary) hypertension: Secondary | ICD-10-CM

## 2023-06-02 ENCOUNTER — Ambulatory Visit (HOSPITAL_COMMUNITY): Payer: 59 | Attending: Cardiology

## 2023-06-02 ENCOUNTER — Telehealth: Payer: Self-pay | Admitting: Cardiology

## 2023-06-02 ENCOUNTER — Other Ambulatory Visit: Payer: Self-pay

## 2023-06-02 DIAGNOSIS — E1165 Type 2 diabetes mellitus with hyperglycemia: Secondary | ICD-10-CM

## 2023-06-02 DIAGNOSIS — I350 Nonrheumatic aortic (valve) stenosis: Secondary | ICD-10-CM | POA: Insufficient documentation

## 2023-06-02 DIAGNOSIS — I700 Atherosclerosis of aorta: Secondary | ICD-10-CM

## 2023-06-02 DIAGNOSIS — I10 Essential (primary) hypertension: Secondary | ICD-10-CM

## 2023-06-02 LAB — ECHOCARDIOGRAM COMPLETE
AR max vel: 0.87 cm2
AV Area VTI: 0.74 cm2
AV Area mean vel: 0.73 cm2
AV Mean grad: 47 mmHg
AV Peak grad: 60.8 mmHg
Ao pk vel: 3.9 m/s
Area-P 1/2: 3.99 cm2
Calc EF: 40.4 %
S' Lateral: 3.83 cm
Single Plane A2C EF: 43.2 %
Single Plane A4C EF: 38 %

## 2023-06-02 MED ORDER — HYDROCHLOROTHIAZIDE 25 MG PO TABS: 25.0000 mg | ORAL_TABLET | Freq: Every morning | ORAL | 3 refills | Status: DC

## 2023-06-02 MED ORDER — ATORVASTATIN CALCIUM 40 MG PO TABS: 40.0000 mg | ORAL_TABLET | Freq: Every day | ORAL | 3 refills | Status: DC

## 2023-06-02 NOTE — Telephone Encounter (Signed)
 Attempted to reach pt over the phone, LVM explaining a refill was sent in for hydrochlorothiazide and atorvastatin. Pt told to call our office back so we can discuss any questions he may have prior to his appt with Dr. Odis Hollingshead.

## 2023-06-02 NOTE — Telephone Encounter (Signed)
 Patient came in for his ECHO today, and stated that he hasn't had his blood pressure pills in 3 months and needs a refill - been trying to contact [facility] and the phone number always rings busy - I gave him our main phone number ([phone])  Please reach out to this patient - patient also states he would like to have a conversation with Dr. Odis Hollingshead before his appointment next week.

## 2023-06-02 NOTE — Progress Notes (Signed)
 Refill for Hydrochlorothiazide and Atorvastatin placed. LVM to let pt know.

## 2023-06-02 NOTE — Telephone Encounter (Signed)
 Jonathon Bray,   Please refill cardiac medications and see what questions he has before the upcoming appt.   Cully Luckow Humnoke, DO, [facility]

## 2023-06-06 ENCOUNTER — Ambulatory Visit: Payer: 59 | Attending: Cardiology | Admitting: Cardiology

## 2023-06-06 VITALS — BP 138/88 | HR 104 | Resp 16 | Ht 65.0 in | Wt 207.8 lb

## 2023-06-06 DIAGNOSIS — I5022 Chronic systolic (congestive) heart failure: Secondary | ICD-10-CM

## 2023-06-06 DIAGNOSIS — I350 Nonrheumatic aortic (valve) stenosis: Secondary | ICD-10-CM

## 2023-06-06 DIAGNOSIS — I10 Essential (primary) hypertension: Secondary | ICD-10-CM

## 2023-06-06 DIAGNOSIS — I447 Left bundle-branch block, unspecified: Secondary | ICD-10-CM

## 2023-06-06 DIAGNOSIS — I77810 Thoracic aortic ectasia: Secondary | ICD-10-CM

## 2023-06-06 DIAGNOSIS — I700 Atherosclerosis of aorta: Secondary | ICD-10-CM | POA: Diagnosis not present

## 2023-06-06 DIAGNOSIS — Z794 Long term (current) use of insulin: Secondary | ICD-10-CM

## 2023-06-06 DIAGNOSIS — E1165 Type 2 diabetes mellitus with hyperglycemia: Secondary | ICD-10-CM

## 2023-06-06 MED ORDER — METOPROLOL SUCCINATE ER 25 MG PO TB24: 25.0000 mg | ORAL_TABLET | Freq: Every day | ORAL | 3 refills | Status: DC

## 2023-06-06 MED ORDER — HYDROCHLOROTHIAZIDE 25 MG PO TABS
25.0000 mg | ORAL_TABLET | Freq: Every morning | ORAL | 3 refills | Status: DC
Start: 2023-06-06 — End: 2023-09-12

## 2023-06-06 MED ORDER — LOSARTAN POTASSIUM 50 MG PO TABS
50.0000 mg | ORAL_TABLET | Freq: Every evening | ORAL | 3 refills | Status: DC
Start: 2023-06-06 — End: 2023-06-13

## 2023-06-06 NOTE — Progress Notes (Signed)
 Cardiology Office Note:  .   ID:  Jonathon Bray, DOB 08-May-1965, MRN [id number] PCP:  Jonathon Specking, MD  Former Cardiology Providers: N/A [facility] Providers Cardiologist:  Jonathon Lerner, DO , [facility] (established care 01/18/2022) Electrophysiologist:  None  Click to update primary MD,subspecialty MD or APP then REFRESH:1}    Chief Complaint  Patient presents with   Nonrheumatic aortic valve stenosis   Follow-up    History of Present Illness: .   Jonathon Bray is a 58 y.o. 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   Patient has history of aortic stenosis which is being monitored by serial echocardiograms.  He presents today for 16-month follow-up visit.  Patient states that he ran out of his losartan for the last 4 months and has not been taking it.  He is not sure if he is on hydrochlorothiazide.  Of note medication reconciliation is difficult to perform as he did not bring a list of medications or bottles at today's office visit.  Approximately 2 weeks ago patient states that he sustained a mechanical fall at home and went to the hospital to be ruled out for bleeding and or complications.  He was later sent home without being hospitalized.  To help with stability and gait he is started using a cane.  Patient states that the time of the fall his blood sugar was 473 mg/dL.  His A1c has not been well-controlled about 5 months ago was 10.8%.  He had a routine follow-up echocardiogram to monitor the severity of aortic stenosis which is now severe and his LVEF is also reduced when compared to prior studies.  EKG today illustrates a new left bundle branch  block which was not appreciated on prior tracings.  Review of Systems: .   Review of Systems  Cardiovascular:  Negative for chest pain, claudication, irregular heartbeat, leg swelling, near-syncope, orthopnea, palpitations, paroxysmal nocturnal dyspnea and syncope.  Respiratory:  Negative for shortness of breath.   Hematologic/Lymphatic: Negative for bleeding problem.  Musculoskeletal:  Positive for falls and joint pain.       Gait difficulties, walking with a cane    Studies Reviewed:   EKG: EKG Interpretation Date/Time:  Monday June 06 2023 14:52:06 EST Ventricular Rate:  104 PR Interval:  160 QRS Duration:  154 QT Interval:  396 QTC Calculation: 520 R Axis:   -36  Text Interpretation: Sinus tachycardia Left axis deviation Left bundle branch block When compared with ECG of 10-Dec-2022 09:43, HEART RATE INCREASED SINCE last tracing. Left bundle branch block IS NEW SINCE last tracing. Confirmed by Jonathon Bray [phone]) on 06/06/2023 3:12:10 PM  Echocardiogram: 05/31/2022: LVEF 60-65%, Moderate to severe aortic valve stenosis (peak velocity 3.29m/s, MG 37.14mmHg, DI 0.20).   06/02/2023:  1. Left ventricular ejection fraction, by estimation, is 35 to 40%. The  left ventricle has moderately decreased function. The left ventricle has  no regional wall motion abnormalities. Left ventricular diastolic  parameters are consistent with Grade I  diastolic dysfunction (impaired relaxation). Elevated left ventricular  end-diastolic pressure.   2. Right ventricular systolic function is normal. The right ventricular  size is normal. There is normal pulmonary artery systolic pressure.   3. The mitral valve is normal in structure. No evidence of mitral  valve  regurgitation. No evidence of mitral stenosis.   4. The aortic valve is calcified. There is severe calcifcation of the  aortic valve. There is severe thickening of the aortic valve. Aortic valve  regurgitation is trivial. Severe aortic  valve stenosis. Aortic valve area,  by VTI measures 0.74 cm. Aortic  valve mean gradient measures 47.0 mmHg. Aortic valve Vmax measures 3.90  m/s.   5. The inferior vena cava is normal in size with greater than 50%  respiratory variability, suggesting right atrial pressure of 3 mmHg.   Coronary angiography 05/10/22:  Normal coronary arteries, right dominant circulation.  Unable to cross into the LV in spite of aggressive attempts.   Impression: Elevated cardiac markers could be related to either pericarditis or myocarditis although EKG essentially normal.  Will repeat echocardiogram.   Patient can be discharged home tomorrow if he remains stable.  No indication for aspirin.   Cardiac MRI  05/11/2022: Modified [facility] Criteria is met for myocarditis.   LGE pattern can also be seen in obstructive coronary disease, but in lieu of this, with slight pericardial thickening and with lack of wall motion abnormality, study is most consistent with myocarditis.   At least moderate aortic stenosis.   Carotid Duplex 02/16/2022: Right Carotid: The extracranial vessels were near-normal with only minimal wall thickening or plaque.  Left Carotid: The extracranial vessels were near-normal with only minimal wall thickening or plaque.  Vertebrals: Bilateral vertebral arteries demonstrate antegrade flow.  Subclavians: Normal flow hemodynamics were seen in bilateral subclavian arteries.   Risk Assessment/Calculations:   NA   Labs:       Latest Ref Rng & Units 06/06/2023    4:33 PM 12/10/2022    9:57 AM 12/07/2022    8:26 AM  CBC  WBC 3.4 - 10.8 x10E3/uL 12.1  11.4  9.6   Hemoglobin 13.0 - 17.7 g/dL 91.4  78.2  95.6   Hematocrit 37.5 - 51.0 % 45.0  40.2  45.5   Platelets 150 - 450 x10E3/uL 246  262  327        Latest Ref Rng & Units 06/06/2023    4:33 PM 12/10/2022    9:57 AM 12/07/2022    8:26 AM  BMP  Glucose 70 - 99 mg/dL 213  086  578   BUN 6 - 24 mg/dL 35  34  32   Creatinine 0.76 -  1.27 mg/dL 4.69  6.29  5.28   BUN/Creat Ratio 9 - 20 25   23    Sodium 134 - 144 mmol/L 137  136  132   Potassium 3.5 - 5.2 mmol/L 5.0  4.7  5.5   Chloride 96 - 106 mmol/L 98  95  93   CO2 20 - 29 mmol/L 25  25  23    Calcium 8.7 - 10.2 mg/dL 9.4  9.5  41.3       Latest Ref Rng & Units 06/06/2023    4:33 PM 12/10/2022    9:57 AM 12/07/2022    8:26 AM  CMP  Glucose 70 - 99 mg/dL 244  010  272   BUN 6 - 24 mg/dL 35  34  32   Creatinine 0.76 - 1.27 mg/dL 5.36  6.44  0.34   Sodium 134 - 144 mmol/L 137  136  132   Potassium 3.5 - 5.2 mmol/L 5.0  4.7  5.5   Chloride 96 - 106 mmol/L 98  95  93   CO2 20 - 29 mmol/L  25  25  23    Calcium 8.7 - 10.2 mg/dL 9.4  9.5  16.1   Total Protein 6.5 - 8.1 g/dL  6.9    Total Bilirubin 0.3 - 1.2 mg/dL  0.9    Alkaline Phos 38 - 126 U/L  63    AST 15 - 41 U/L  15    ALT 0 - 44 U/L  21      Lab Results  Component Value Date   CHOL 116 05/10/2022   HDL 45 05/10/2022   LDLCALC 58 05/10/2022   LDLDIRECT 119 (H) 02/22/2022   TRIG 63 05/10/2022   CHOLHDL 2.6 05/10/2022   No results for input(s): "LIPOA" in the last 8760 hours. No components found for: "NTPROBNP" Recent Labs    06/06/23 1633  PROBNP 1,093*   No results for input(s): "TSH" in the last 8760 hours.  External Labs: Collected: 05/08/2023 [facility] health care Care Everywhere Sodium 135, potassium 5.2, chloride 96, bicarb 30.7. BUN 35, creatinine 1.45. eGFR 56. Blood glucose 473. AST 9, ALT 16, alkaline phosphatase 76 Hemoglobin 15.3 g/dL BNP 096  Collected: 0/07/5407  A1c 10.8  Physical Exam:    Today's Vitals   06/06/23 1451  BP: 138/88  Pulse: (!) 104  Resp: 16  SpO2: 97%  Weight: 207 lb 12.8 oz (94.3 kg)  Height: 5\' 5"  (1.651 m)   Body mass index is 34.58 kg/m. Wt Readings from Last 3 Encounters:  06/06/23 207 lb 12.8 oz (94.3 kg)  12/10/22 209 lb 10.5 oz (95.1 kg)  12/07/22 209 lb 9.6 oz (95.1 kg)    Physical Exam  Constitutional: No distress. He appears chronically  ill.  Appears older than stated age, hemodynamically stable, ambulates w/ cane  HENT:  Poor dental hygiene  Neck: No JVD present.  Cardiovascular: Normal rate, regular rhythm, S1 normal, S2 normal, intact distal pulses and normal pulses. Exam reveals no gallop, no S3 and no S4.  Murmur heard. Crescendo-decrescendo midsystolic murmur is present with a grade of 3/6 at the upper right sternal border radiating to the neck. Pulses:      Carotid pulses are  on the right side with bruit and  on the left side with bruit. Pulmonary/Chest: Effort normal and breath sounds normal. No stridor. He has no wheezes. He has no rales.  Abdominal: Soft. Bowel sounds are normal. He exhibits no distension. There is no abdominal tenderness.  Musculoskeletal:        General: No edema.     Cervical back: Neck supple.  Neurological: He is alert and oriented to person, place, and time. He has intact cranial nerves (2-12).  Skin: Skin is warm and moist.   Impression & Recommendation(s):  Impression:   ICD-10-CM   1. Nonrheumatic aortic valve stenosis  I35.0 Basic metabolic panel    Pro b natriuretic peptide (BNP)    CBC    CBC    Pro b natriuretic peptide (BNP)    Basic metabolic panel    DISCONTINUED: losartan (COZAAR) 50 MG tablet    2. Atherosclerosis of aorta (HCC)  I70.0 EKG 12-Lead    3. Chronic HFrEF (heart failure with reduced ejection fraction) (HCC)  I50.22 Basic metabolic panel    Pro b natriuretic peptide (BNP)    CBC    CBC    Pro b natriuretic peptide (BNP)    Basic metabolic panel    4. LBBB (left bundle branch block)  I44.7     5. Essential hypertension  I10 hydrochlorothiazide (  HYDRODIURIL) 25 MG tablet    Basic metabolic panel    Basic metabolic panel    DISCONTINUED: losartan (COZAAR) 50 MG tablet    6. Type 2 diabetes mellitus with hyperglycemia, with long-term current use of insulin (HCC)  E11.65    Z79.4     7. Ascending aorta dilatation (HCC)  I77.810         Recommendation(s):  Nonrheumatic aortic valve stenosis Acute and progressive Echocardiogram 05/31/2022: LVEF 60-65%. Echocardiogram February 2025 illustrates severe aortic stenosis with newly developed reduced LVEF at 35-40%  2 weeks ago patient sustained a mechanical fall-patient informs me that this was not a syncopal event. Denies anginal chest pain and clinically appears to be euvolemic.  On physical examination he has grade 1 diastolic dysfunction. Given the reduction in LVEF and the severity of aortic valve stenosis we discussed proceeding forward with aortic valve replacement workup.  Discussed the risks, benefits, alternatives to left and right heart catheterization.  The procedure of left and right heart catheterization with possible intervention was explained to the patient in detail.  The indication, alternatives, risks and benefits were reviewed.  Complications include but not limited to bleeding, infection, vascular injury, stroke, myocardial infarction, arrhythmia (requiring medical or cardiopulmonary resuscitation), kidney injury (requiring short-term or long-term hemodialysis), radiation-related injury in the case of prolonged fluoroscopy use, emergent cardiac surgery, temporary or permanent pacemaker, and death. The patient understands the risks of serious complication is 1-2 in 1000 with diagnostic cardiac cath and 1-2% or less with angioplasty/stenting. The patient voices understanding and provides verbal feedback questions and concerns are addressed to his satisfaction and patient wishes to proceed with left and right heart catheterization.  Chronic HFrEF (heart failure with reduced ejection fraction) (HCC)-newly discovered LBBB (left bundle branch block)-newly discovered History of idiopathic myocarditis February 2025: LVEF 35-40%, grade 1 diastolic dysfunction, severe aortic stenosis. Clinically appears to be euvolemic.  But I suspect that his cardiac biomarkers will be  elevated.  Prior to completion of the progress note patient is NT-proBNP levels came back elevated with a degree of renal insufficiency.  He would likely need to be on additional diuretics after his left heart catheterization will avoid initiation for now to prevent acute kidney injury. Hopeful that the LVEF will recover after aortic valve replacement. Given his young age may benefit from surgical aortic valve replacement versus TAVR.  The ultimate decision will be dependent on further evaluation and management patient is aware and agreeable with the plan of care Restart losartan 50 mg p.o. every morning. Will start Toprol-XL 25 mg p.o. every morning. Has not tolerated Invokana or Farxiga in the past  Essential hypertension Office blood pressures are acceptable. Will restart losartan 50 mg p.o. every morning. Start Toprol-XL 25 mg p.o. every morning. Will uptitrate GDMT  Type 2 diabetes mellitus with hyperglycemia, with long-term current use of insulin (HCC) Poorly controlled diabetic Do not have the most recent A1c-per patient 5 months ago A1c was approximately 10.8 Currently on insulin therapy. Currently on metformin 500 mg p.o. twice daily Has not tolerated Invokana or Farxiga in the past as discussed above Continue Lipitor 40 mg p.o. 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  Cardiac MRI February 2024: 39 mm ascending aorta Echocardiogram February 2025: Ascending aorta 36 mm Reemphasized importance of blood pressure management. Restarted on losartan and started Toprol-XL Likely will need longitudinal follow-up but should be reevaluated prior to aortic valve replacement, for completeness.  Orders Placed:  Orders Placed This Encounter  Procedures   Basic metabolic panel    Standing Status:   Future    Number of Occurrences:   1    Expected Date:   06/06/2023    Expiration Date:   06/05/2024   Pro b  natriuretic peptide (BNP)    Standing Status:   Future    Number of Occurrences:   1    Expected Date:   06/06/2023    Expiration Date:   06/05/2024   CBC    Standing Status:   Future    Number of Occurrences:   1    Expected Date:   06/06/2023    Expiration Date:   06/05/2024   EKG 12-Lead   Final Medication List:    Meds ordered this encounter  Medications   DISCONTD: metoprolol succinate (TOPROL XL) 25 MG 24 hr tablet    Sig: Take 1 tablet (25 mg total) by mouth daily. HOLD if systolic blood pressure (top number) is less than 100 and/ or if heart rate is less than 55.    Dispense:  30 tablet    Refill:  3   DISCONTD: losartan (COZAAR) 50 MG tablet    Sig: Take 1 tablet (50 mg total) by mouth every evening.    Dispense:  90 tablet    Refill:  3   hydrochlorothiazide (HYDRODIURIL) 25 MG tablet    Sig: Take 1 tablet (25 mg total) by mouth every morning.    Dispense:  90 tablet    Refill:  3    Medications Discontinued During This Encounter  Medication Reason   losartan (COZAAR) 50 MG tablet Reorder   hydrochlorothiazide (HYDRODIURIL) 25 MG tablet Reorder     Current Outpatient Medications:    atorvastatin (LIPITOR) 40 MG tablet, Take 1 tablet (40 mg total) by mouth at bedtime., Disp: 90 tablet, Rfl: 3   insulin lispro (HUMALOG) 100 UNIT/ML KwikPen, Inject 16 Units into the skin 2 (two) times daily with a meal., Disp: , Rfl:    Insulin NPH, Human,, Isophane, (HUMULIN N KWIKPEN) 100 UNIT/ML Kiwkpen, Inject 20 Units into the skin in the morning and at bedtime., Disp: , Rfl:    metFORMIN (GLUCOPHAGE-XR) 500 MG 24 hr tablet, Take 1,000 mg by mouth 2 (two) times daily., Disp: , Rfl:    TRULICITY 3 MG/0.5ML SOPN, SMARTSIG:0.5 Milliliter(s) SUB-Q Once a Week, Disp: , Rfl:    hydrochlorothiazide (HYDRODIURIL) 25 MG tablet, Take 1 tablet (25 mg total) by mouth every morning., Disp: 90 tablet, Rfl: 3   meloxicam (MOBIC) 15 MG tablet, Take 15 mg by mouth every morning., Disp: , Rfl:    Consent:   See above  Disposition:   4 weeks, status post left and right heart catheterization to determine/coordinate workup for aortic valve replacement  His questions and concerns were addressed to his satisfaction. He voices understanding of the recommendations provided during this encounter.    Signed, Jonathon Lerner, DO, [facility]  [facility]  [address] [place], [place] [address]

## 2023-06-06 NOTE — H&P (View-Only) (Signed)
 Cardiology Office Note:  .   ID:  Jonathon Bray, DOB 08-May-1965, MRN [id number] PCP:  Ignatius Specking, MD  Former Cardiology Providers: N/A [facility] Providers Cardiologist:  Tessa Lerner, DO , [facility] (established care 01/18/2022) Electrophysiologist:  None  Click to update primary MD,subspecialty MD or APP then REFRESH:1}    Chief Complaint  Patient presents with   Nonrheumatic aortic valve stenosis   Follow-up    History of Present Illness: .   Jonathon Bray is a 58 y.o. 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   Patient has history of aortic stenosis which is being monitored by serial echocardiograms.  He presents today for 16-month follow-up visit.  Patient states that he ran out of his losartan for the last 4 months and has not been taking it.  He is not sure if he is on hydrochlorothiazide.  Of note medication reconciliation is difficult to perform as he did not bring a list of medications or bottles at today's office visit.  Approximately 2 weeks ago patient states that he sustained a mechanical fall at home and went to the hospital to be ruled out for bleeding and or complications.  He was later sent home without being hospitalized.  To help with stability and gait he is started using a cane.  Patient states that the time of the fall his blood sugar was 473 mg/dL.  His A1c has not been well-controlled about 5 months ago was 10.8%.  He had a routine follow-up echocardiogram to monitor the severity of aortic stenosis which is now severe and his LVEF is also reduced when compared to prior studies.  EKG today illustrates a new left bundle branch  block which was not appreciated on prior tracings.  Review of Systems: .   Review of Systems  Cardiovascular:  Negative for chest pain, claudication, irregular heartbeat, leg swelling, near-syncope, orthopnea, palpitations, paroxysmal nocturnal dyspnea and syncope.  Respiratory:  Negative for shortness of breath.   Hematologic/Lymphatic: Negative for bleeding problem.  Musculoskeletal:  Positive for falls and joint pain.       Gait difficulties, walking with a cane    Studies Reviewed:   EKG: EKG Interpretation Date/Time:  Monday June 06 2023 14:52:06 EST Ventricular Rate:  104 PR Interval:  160 QRS Duration:  154 QT Interval:  396 QTC Calculation: 520 R Axis:   -36  Text Interpretation: Sinus tachycardia Left axis deviation Left bundle branch block When compared with ECG of 10-Dec-2022 09:43, HEART RATE INCREASED SINCE last tracing. Left bundle branch block IS NEW SINCE last tracing. Confirmed by Tessa Lerner [phone]) on 06/06/2023 3:12:10 PM  Echocardiogram: 05/31/2022: LVEF 60-65%, Moderate to severe aortic valve stenosis (peak velocity 3.29m/s, MG 37.14mmHg, DI 0.20).   06/02/2023:  1. Left ventricular ejection fraction, by estimation, is 35 to 40%. The  left ventricle has moderately decreased function. The left ventricle has  no regional wall motion abnormalities. Left ventricular diastolic  parameters are consistent with Grade I  diastolic dysfunction (impaired relaxation). Elevated left ventricular  end-diastolic pressure.   2. Right ventricular systolic function is normal. The right ventricular  size is normal. There is normal pulmonary artery systolic pressure.   3. The mitral valve is normal in structure. No evidence of mitral  valve  regurgitation. No evidence of mitral stenosis.   4. The aortic valve is calcified. There is severe calcifcation of the  aortic valve. There is severe thickening of the aortic valve. Aortic valve  regurgitation is trivial. Severe aortic  valve stenosis. Aortic valve area,  by VTI measures 0.74 cm. Aortic  valve mean gradient measures 47.0 mmHg. Aortic valve Vmax measures 3.90  m/s.   5. The inferior vena cava is normal in size with greater than 50%  respiratory variability, suggesting right atrial pressure of 3 mmHg.   Coronary angiography 05/10/22:  Normal coronary arteries, right dominant circulation.  Unable to cross into the LV in spite of aggressive attempts.   Impression: Elevated cardiac markers could be related to either pericarditis or myocarditis although EKG essentially normal.  Will repeat echocardiogram.   Patient can be discharged home tomorrow if he remains stable.  No indication for aspirin.   Cardiac MRI  05/11/2022: Modified [facility] Criteria is met for myocarditis.   LGE pattern can also be seen in obstructive coronary disease, but in lieu of this, with slight pericardial thickening and with lack of wall motion abnormality, study is most consistent with myocarditis.   At least moderate aortic stenosis.   Carotid Duplex 02/16/2022: Right Carotid: The extracranial vessels were near-normal with only minimal wall thickening or plaque.  Left Carotid: The extracranial vessels were near-normal with only minimal wall thickening or plaque.  Vertebrals: Bilateral vertebral arteries demonstrate antegrade flow.  Subclavians: Normal flow hemodynamics were seen in bilateral subclavian arteries.   Risk Assessment/Calculations:   NA   Labs:       Latest Ref Rng & Units 06/06/2023    4:33 PM 12/10/2022    9:57 AM 12/07/2022    8:26 AM  CBC  WBC 3.4 - 10.8 x10E3/uL 12.1  11.4  9.6   Hemoglobin 13.0 - 17.7 g/dL 91.4  78.2  95.6   Hematocrit 37.5 - 51.0 % 45.0  40.2  45.5   Platelets 150 - 450 x10E3/uL 246  262  327        Latest Ref Rng & Units 06/06/2023    4:33 PM 12/10/2022    9:57 AM 12/07/2022    8:26 AM  BMP  Glucose 70 - 99 mg/dL 213  086  578   BUN 6 - 24 mg/dL 35  34  32   Creatinine 0.76 -  1.27 mg/dL 4.69  6.29  5.28   BUN/Creat Ratio 9 - 20 25   23    Sodium 134 - 144 mmol/L 137  136  132   Potassium 3.5 - 5.2 mmol/L 5.0  4.7  5.5   Chloride 96 - 106 mmol/L 98  95  93   CO2 20 - 29 mmol/L 25  25  23    Calcium 8.7 - 10.2 mg/dL 9.4  9.5  41.3       Latest Ref Rng & Units 06/06/2023    4:33 PM 12/10/2022    9:57 AM 12/07/2022    8:26 AM  CMP  Glucose 70 - 99 mg/dL 244  010  272   BUN 6 - 24 mg/dL 35  34  32   Creatinine 0.76 - 1.27 mg/dL 5.36  6.44  0.34   Sodium 134 - 144 mmol/L 137  136  132   Potassium 3.5 - 5.2 mmol/L 5.0  4.7  5.5   Chloride 96 - 106 mmol/L 98  95  93   CO2 20 - 29 mmol/L  25  25  23    Calcium 8.7 - 10.2 mg/dL 9.4  9.5  16.1   Total Protein 6.5 - 8.1 g/dL  6.9    Total Bilirubin 0.3 - 1.2 mg/dL  0.9    Alkaline Phos 38 - 126 U/L  63    AST 15 - 41 U/L  15    ALT 0 - 44 U/L  21      Lab Results  Component Value Date   CHOL 116 05/10/2022   HDL 45 05/10/2022   LDLCALC 58 05/10/2022   LDLDIRECT 119 (H) 02/22/2022   TRIG 63 05/10/2022   CHOLHDL 2.6 05/10/2022   No results for input(s): "LIPOA" in the last 8760 hours. No components found for: "NTPROBNP" Recent Labs    06/06/23 1633  PROBNP 1,093*   No results for input(s): "TSH" in the last 8760 hours.  External Labs: Collected: 05/08/2023 [facility] health care Care Everywhere Sodium 135, potassium 5.2, chloride 96, bicarb 30.7. BUN 35, creatinine 1.45. eGFR 56. Blood glucose 473. AST 9, ALT 16, alkaline phosphatase 76 Hemoglobin 15.3 g/dL BNP 096  Collected: 0/07/5407  A1c 10.8  Physical Exam:    Today's Vitals   06/06/23 1451  BP: 138/88  Pulse: (!) 104  Resp: 16  SpO2: 97%  Weight: 207 lb 12.8 oz (94.3 kg)  Height: 5\' 5"  (1.651 m)   Body mass index is 34.58 kg/m. Wt Readings from Last 3 Encounters:  06/06/23 207 lb 12.8 oz (94.3 kg)  12/10/22 209 lb 10.5 oz (95.1 kg)  12/07/22 209 lb 9.6 oz (95.1 kg)    Physical Exam  Constitutional: No distress. He appears chronically  ill.  Appears older than stated age, hemodynamically stable, ambulates w/ cane  HENT:  Poor dental hygiene  Neck: No JVD present.  Cardiovascular: Normal rate, regular rhythm, S1 normal, S2 normal, intact distal pulses and normal pulses. Exam reveals no gallop, no S3 and no S4.  Murmur heard. Crescendo-decrescendo midsystolic murmur is present with a grade of 3/6 at the upper right sternal border radiating to the neck. Pulses:      Carotid pulses are  on the right side with bruit and  on the left side with bruit. Pulmonary/Chest: Effort normal and breath sounds normal. No stridor. He has no wheezes. He has no rales.  Abdominal: Soft. Bowel sounds are normal. He exhibits no distension. There is no abdominal tenderness.  Musculoskeletal:        General: No edema.     Cervical back: Neck supple.  Neurological: He is alert and oriented to person, place, and time. He has intact cranial nerves (2-12).  Skin: Skin is warm and moist.   Impression & Recommendation(s):  Impression:   ICD-10-CM   1. Nonrheumatic aortic valve stenosis  I35.0 Basic metabolic panel    Pro b natriuretic peptide (BNP)    CBC    CBC    Pro b natriuretic peptide (BNP)    Basic metabolic panel    DISCONTINUED: losartan (COZAAR) 50 MG tablet    2. Atherosclerosis of aorta (HCC)  I70.0 EKG 12-Lead    3. Chronic HFrEF (heart failure with reduced ejection fraction) (HCC)  I50.22 Basic metabolic panel    Pro b natriuretic peptide (BNP)    CBC    CBC    Pro b natriuretic peptide (BNP)    Basic metabolic panel    4. LBBB (left bundle branch block)  I44.7     5. Essential hypertension  I10 hydrochlorothiazide (  HYDRODIURIL) 25 MG tablet    Basic metabolic panel    Basic metabolic panel    DISCONTINUED: losartan (COZAAR) 50 MG tablet    6. Type 2 diabetes mellitus with hyperglycemia, with long-term current use of insulin (HCC)  E11.65    Z79.4     7. Ascending aorta dilatation (HCC)  I77.810         Recommendation(s):  Nonrheumatic aortic valve stenosis Acute and progressive Echocardiogram 05/31/2022: LVEF 60-65%. Echocardiogram February 2025 illustrates severe aortic stenosis with newly developed reduced LVEF at 35-40%  2 weeks ago patient sustained a mechanical fall-patient informs me that this was not a syncopal event. Denies anginal chest pain and clinically appears to be euvolemic.  On physical examination he has grade 1 diastolic dysfunction. Given the reduction in LVEF and the severity of aortic valve stenosis we discussed proceeding forward with aortic valve replacement workup.  Discussed the risks, benefits, alternatives to left and right heart catheterization.  The procedure of left and right heart catheterization with possible intervention was explained to the patient in detail.  The indication, alternatives, risks and benefits were reviewed.  Complications include but not limited to bleeding, infection, vascular injury, stroke, myocardial infarction, arrhythmia (requiring medical or cardiopulmonary resuscitation), kidney injury (requiring short-term or long-term hemodialysis), radiation-related injury in the case of prolonged fluoroscopy use, emergent cardiac surgery, temporary or permanent pacemaker, and death. The patient understands the risks of serious complication is 1-2 in 1000 with diagnostic cardiac cath and 1-2% or less with angioplasty/stenting. The patient voices understanding and provides verbal feedback questions and concerns are addressed to his satisfaction and patient wishes to proceed with left and right heart catheterization.  Chronic HFrEF (heart failure with reduced ejection fraction) (HCC)-newly discovered LBBB (left bundle branch block)-newly discovered History of idiopathic myocarditis February 2025: LVEF 35-40%, grade 1 diastolic dysfunction, severe aortic stenosis. Clinically appears to be euvolemic.  But I suspect that his cardiac biomarkers will be  elevated.  Prior to completion of the progress note patient is NT-proBNP levels came back elevated with a degree of renal insufficiency.  He would likely need to be on additional diuretics after his left heart catheterization will avoid initiation for now to prevent acute kidney injury. Hopeful that the LVEF will recover after aortic valve replacement. Given his young age may benefit from surgical aortic valve replacement versus TAVR.  The ultimate decision will be dependent on further evaluation and management patient is aware and agreeable with the plan of care Restart losartan 50 mg p.o. every morning. Will start Toprol-XL 25 mg p.o. every morning. Has not tolerated Invokana or Farxiga in the past  Essential hypertension Office blood pressures are acceptable. Will restart losartan 50 mg p.o. every morning. Start Toprol-XL 25 mg p.o. every morning. Will uptitrate GDMT  Type 2 diabetes mellitus with hyperglycemia, with long-term current use of insulin (HCC) Poorly controlled diabetic Do not have the most recent A1c-per patient 5 months ago A1c was approximately 10.8 Currently on insulin therapy. Currently on metformin 500 mg p.o. twice daily Has not tolerated Invokana or Farxiga in the past as discussed above Continue Lipitor 40 mg p.o. 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  Cardiac MRI February 2024: 39 mm ascending aorta Echocardiogram February 2025: Ascending aorta 36 mm Reemphasized importance of blood pressure management. Restarted on losartan and started Toprol-XL Likely will need longitudinal follow-up but should be reevaluated prior to aortic valve replacement, for completeness.  Orders Placed:  Orders Placed This Encounter  Procedures   Basic metabolic panel    Standing Status:   Future    Number of Occurrences:   1    Expected Date:   06/06/2023    Expiration Date:   06/05/2024   Pro b  natriuretic peptide (BNP)    Standing Status:   Future    Number of Occurrences:   1    Expected Date:   06/06/2023    Expiration Date:   06/05/2024   CBC    Standing Status:   Future    Number of Occurrences:   1    Expected Date:   06/06/2023    Expiration Date:   06/05/2024   EKG 12-Lead   Final Medication List:    Meds ordered this encounter  Medications   DISCONTD: metoprolol succinate (TOPROL XL) 25 MG 24 hr tablet    Sig: Take 1 tablet (25 mg total) by mouth daily. HOLD if systolic blood pressure (top number) is less than 100 and/ or if heart rate is less than 55.    Dispense:  30 tablet    Refill:  3   DISCONTD: losartan (COZAAR) 50 MG tablet    Sig: Take 1 tablet (50 mg total) by mouth every evening.    Dispense:  90 tablet    Refill:  3   hydrochlorothiazide (HYDRODIURIL) 25 MG tablet    Sig: Take 1 tablet (25 mg total) by mouth every morning.    Dispense:  90 tablet    Refill:  3    Medications Discontinued During This Encounter  Medication Reason   losartan (COZAAR) 50 MG tablet Reorder   hydrochlorothiazide (HYDRODIURIL) 25 MG tablet Reorder     Current Outpatient Medications:    atorvastatin (LIPITOR) 40 MG tablet, Take 1 tablet (40 mg total) by mouth at bedtime., Disp: 90 tablet, Rfl: 3   insulin lispro (HUMALOG) 100 UNIT/ML KwikPen, Inject 16 Units into the skin 2 (two) times daily with a meal., Disp: , Rfl:    Insulin NPH, Human,, Isophane, (HUMULIN N KWIKPEN) 100 UNIT/ML Kiwkpen, Inject 20 Units into the skin in the morning and at bedtime., Disp: , Rfl:    metFORMIN (GLUCOPHAGE-XR) 500 MG 24 hr tablet, Take 1,000 mg by mouth 2 (two) times daily., Disp: , Rfl:    TRULICITY 3 MG/0.5ML SOPN, SMARTSIG:0.5 Milliliter(s) SUB-Q Once a Week, Disp: , Rfl:    hydrochlorothiazide (HYDRODIURIL) 25 MG tablet, Take 1 tablet (25 mg total) by mouth every morning., Disp: 90 tablet, Rfl: 3   meloxicam (MOBIC) 15 MG tablet, Take 15 mg by mouth every morning., Disp: , Rfl:    Consent:   See above  Disposition:   4 weeks, status post left and right heart catheterization to determine/coordinate workup for aortic valve replacement  His questions and concerns were addressed to his satisfaction. He voices understanding of the recommendations provided during this encounter.    Signed, Tessa Lerner, DO, [facility]  [facility]  [address] [place], [place] [address]

## 2023-06-06 NOTE — Patient Instructions (Addendum)
 Medication Instructions:  Your physician has recommended you make the following change in your medication:   START Metoprolol Succinate (Toprol-XL) 25 mg once daily *HOLD if systolic blood pressure (top number) is less than 100 and/ or if heart rate is less than 55   Refill for Losartan 50 mg in the evening and hydrochlorothiazide 25 mg in the morning have been sent to your pharmacy.  *If you need a refill on your cardiac medications before your next appointment, please call your pharmacy*  Lab Work: To be completed today: BMP, Pro-BNP, and CBC  If you have labs (blood work) drawn today and your tests are completely normal, you will receive your results only by: MyChart Message (if you have MyChart) OR A paper copy in the mail If you have any lab test that is abnormal or we need to change your treatment, we will call you to review the results.  Testing/Procedures: Your physician has requested that you have a right and left heart catherizaton.   Follow-Up: At [facility], you and your health needs are our priority.  As part of our continuing mission to provide you with exceptional heart care, we have created designated Provider Care Teams.  These Care Teams include your primary Cardiologist (physician) and Advanced Practice Providers (APPs -  Physician Assistants and Nurse Practitioners) who all work together to provide you with the care you need, when you need it.  We recommend signing up for the patient portal called "MyChart".  Sign up information is provided on this After Visit Summary.  MyChart is used to connect with patients for Virtual Visits (Telemedicine).  Patients are able to view lab/test results, encounter notes, upcoming appointments, etc.  Non-urgent messages can be sent to your provider as well.   To learn more about what you can do with MyChart, go to [email or web address].    Your next appointment:   4 week(s)  The format for your next appointment:   In  Person  Provider:   Tessa Lerner, DO {  Other Instructions  [place] [facility] A DEPT OF [place]. [facility] AT [facility] [address] [place], [place] [address] [place] [place] [address] Dept: [phone] Loc: [phone]  Brittin Janik  06/06/2023  You are scheduled for a Cardiac Catheterization on Friday, March 21 with Dr. Tonny Bollman.  1. Please arrive at the [facility] (Main Entrance A) at [facility]: [address] [place], [place] [address] at 5:30 AM (This time is 2 hour(s) before your procedure to ensure your preparation).   Free valet parking service is available. You will check in at ADMITTING. The support person will be asked to wait in the waiting room.  It is OK to have someone drop you off and come back when you are ready to be discharged.    Special note: Every effort is made to have your procedure done on time. Please understand that emergencies sometimes delay scheduled procedures.  2. Diet: Do not eat solid foods after midnight.  The patient may have clear liquids until 5am upon the day of the procedure.  3. Labs: You will need to have blood drawn on Friday, March 14 at [facility].  4. Medication instructions in preparation for your procedure:   Contrast Allergy: No  Stop taking, HTCZ (Hydrochlorothiazide) Friday, March 21,  Take only 16 units of insulin the night before your procedure. Do not take any insulin on the day of the procedure.  Do not take  Diabetes Med Glucophage (Metformin) on the day of the procedure and HOLD 48 HOURS AFTER THE PROCEDURE.  On the morning of your procedure, take your Aspirin 81 mg and any morning medicines NOT listed above.  You may use sips of water.  5. Plan to go home the same day, you will only stay overnight if medically necessary. 6. Bring a current list of your medications and current insurance cards. 7. You MUST have a responsible person to drive you home. 8. Someone MUST be  with you the first 24 hours after you arrive home or your discharge will be delayed. 9. Please wear clothes that are easy to get on and off and wear slip-on shoes.  Thank you for allowing [place] to care for you!   -- [facility]

## 2023-06-07 LAB — BASIC METABOLIC PANEL
BUN/Creatinine Ratio: 25 — ABNORMAL HIGH (ref 9–20)
BUN: 35 mg/dL — ABNORMAL HIGH (ref 6–24)
CO2: 25 mmol/L (ref 20–29)
Calcium: 9.4 mg/dL (ref 8.7–10.2)
Chloride: 98 mmol/L (ref 96–106)
Creatinine, Ser: 1.42 mg/dL — ABNORMAL HIGH (ref 0.76–1.27)
Glucose: 382 mg/dL — ABNORMAL HIGH (ref 70–99)
Potassium: 5 mmol/L (ref 3.5–5.2)
Sodium: 137 mmol/L (ref 134–144)
eGFR: 58 mL/min/{1.73_m2} — ABNORMAL LOW (ref >59)

## 2023-06-07 LAB — CBC
Hematocrit: 45 % (ref 37.5–51.0)
Hemoglobin: 15.5 g/dL (ref 13.0–17.7)
MCH: 29.4 pg (ref 26.6–33.0)
MCHC: 34.4 g/dL (ref 31.5–35.7)
MCV: 85 fL (ref 79–97)
Platelets: 246 10*3/uL (ref 150–450)
RBC: 5.27 x10E6/uL (ref 4.14–5.80)
RDW: 12.6 % (ref 11.6–15.4)
WBC: 12.1 10*3/uL — ABNORMAL HIGH (ref 3.4–10.8)

## 2023-06-07 LAB — PRO B NATRIURETIC PEPTIDE: NT-Pro BNP: 1093 pg/mL — ABNORMAL HIGH (ref 0–210)

## 2023-06-14 NOTE — Progress Notes (Incomplete)
 Cardiology Office Note:  .   ID:  Jonathon Bray, DOB 1965/10/17, MRN [id number] PCP:  Ignatius Specking, MD  Former Cardiology Providers: N/A [facility] Providers Cardiologist:  Tessa Lerner, DO , [facility] (established care 01/18/2022) Electrophysiologist:  None  Click to update primary MD,subspecialty MD or APP then REFRESH:1}    Chief Complaint  Patient presents with  . Nonrheumatic aortic valve stenosis  . Follow-up    History of Present Illness: .   Jonathon Bray is a 58 y.o. 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   Patient has history of aortic stenosis which is being monitored by serial echocardiograms.  He presents today for 78-month follow-up visit.  Patient states that he ran out of his losartan for the last 4 months and has not been taking it.  He is not sure if he is on hydrochlorothiazide.  Of note medication reconciliation is difficult to perform as he did not bring a list of medications or bottles at today's office visit.  Approximately 2 weeks ago patient states that he sustained a mechanical fall at home and went to the hospital to be ruled out for bleeding and or complications.  He was later sent home without being hospitalized.  To help with stability and gait he is started using a cane.  Patient states that the time of the fall his blood sugar was 473 mg/dL.  His A1c has not been well-controlled about 5 months ago was 10.8%.  He had a routine follow-up echocardiogram to monitor the severity of aortic stenosis which is now severe and his LVEF is also reduced when compared to prior studies.  EKG today illustrates a new left bundle branch  block which was not appreciated on prior tracings.  Review of Systems: .   Review of Systems  Cardiovascular:  Negative for chest pain, claudication, irregular heartbeat, leg swelling, near-syncope, orthopnea, palpitations, paroxysmal nocturnal dyspnea and syncope.  Respiratory:  Negative for shortness of breath.   Hematologic/Lymphatic: Negative for bleeding problem.  Musculoskeletal:  Positive for falls and joint pain.       Gait difficulties, walking with a cane    Studies Reviewed:   EKG: EKG Interpretation Date/Time:  Monday June 06 2023 14:52:06 EST Ventricular Rate:  104 PR Interval:  160 QRS Duration:  154 QT Interval:  396 QTC Calculation: 520 R Axis:   -36  Text Interpretation: Sinus tachycardia Left axis deviation Left bundle branch block When compared with ECG of 10-Dec-2022 09:43, HEART RATE INCREASED SINCE last tracing. Left bundle branch block IS NEW SINCE last tracing. Confirmed by Tessa Lerner [phone]) on 06/06/2023 3:12:10 PM  Echocardiogram: 05/31/2022: LVEF 60-65%, Moderate to severe aortic valve stenosis (peak velocity 3.15m/s, MG 37.37mmHg, DI 0.20).   06/02/2023:  1. Left ventricular ejection fraction, by estimation, is 35 to 40%. The  left ventricle has moderately decreased function. The left ventricle has  no regional wall motion abnormalities. Left ventricular diastolic  parameters are consistent with Grade I  diastolic dysfunction (impaired relaxation). Elevated left ventricular  end-diastolic pressure.   2. Right ventricular systolic function is normal. The right ventricular  size is normal. There is normal pulmonary artery systolic pressure.   3. The mitral valve is normal in structure. No evidence of mitral  valve  regurgitation. No evidence of mitral stenosis.   4. The aortic valve is calcified. There is severe calcifcation of the  aortic valve. There is severe thickening of the aortic valve. Aortic valve  regurgitation is trivial. Severe aortic  valve stenosis. Aortic valve area,  by VTI measures 0.74 cm. Aortic  valve mean gradient measures 47.0 mmHg. Aortic valve Vmax measures 3.90  m/s.   5. The inferior vena cava is normal in size with greater than 50%  respiratory variability, suggesting right atrial pressure of 3 mmHg.   Coronary angiography 05/10/22:  Normal coronary arteries, right dominant circulation.  Unable to cross into the LV in spite of aggressive attempts.   Impression: Elevated cardiac markers could be related to either pericarditis or myocarditis although EKG essentially normal.  Will repeat echocardiogram.   Patient can be discharged home tomorrow if he remains stable.  No indication for aspirin.   Cardiac MRI  05/11/2022: Modified [facility] Criteria is met for myocarditis.   LGE pattern can also be seen in obstructive coronary disease, but in lieu of this, with slight pericardial thickening and with lack of wall motion abnormality, study is most consistent with myocarditis.   At least moderate aortic stenosis.   Carotid Duplex 02/16/2022: Right Carotid: The extracranial vessels were near-normal with only minimal wall thickening or plaque.  Left Carotid: The extracranial vessels were near-normal with only minimal wall thickening or plaque.  Vertebrals: Bilateral vertebral arteries demonstrate antegrade flow.  Subclavians: Normal flow hemodynamics were seen in bilateral subclavian arteries.   Risk Assessment/Calculations:   ***   Labs:       Latest Ref Rng & Units 06/06/2023    4:33 PM 12/10/2022    9:57 AM 12/07/2022    8:26 AM  CBC  WBC 3.4 - 10.8 x10E3/uL 12.1  11.4  9.6   Hemoglobin 13.0 - 17.7 g/dL 21.3  08.6  57.8   Hematocrit 37.5 - 51.0 % 45.0  40.2  45.5   Platelets 150 - 450 x10E3/uL 246  262  327        Latest Ref Rng & Units 06/06/2023    4:33 PM 12/10/2022    9:57 AM 12/07/2022    8:26 AM  BMP  Glucose 70 - 99 mg/dL 469  629  528   BUN 6 - 24 mg/dL 35  34  32   Creatinine 0.76 -  1.27 mg/dL 4.13  2.44  0.10   BUN/Creat Ratio 9 - 20 25   23    Sodium 134 - 144 mmol/L 137  136  132   Potassium 3.5 - 5.2 mmol/L 5.0  4.7  5.5   Chloride 96 - 106 mmol/L 98  95  93   CO2 20 - 29 mmol/L 25  25  23    Calcium 8.7 - 10.2 mg/dL 9.4  9.5  27.2       Latest Ref Rng & Units 06/06/2023    4:33 PM 12/10/2022    9:57 AM 12/07/2022    8:26 AM  CMP  Glucose 70 - 99 mg/dL 536  644  034   BUN 6 - 24 mg/dL 35  34  32   Creatinine 0.76 - 1.27 mg/dL 7.42  5.95  6.38   Sodium 134 - 144 mmol/L 137  136  132   Potassium 3.5 - 5.2 mmol/L 5.0  4.7  5.5   Chloride 96 - 106 mmol/L 98  95  93   CO2 20 - 29 mmol/L  25  25  23    Calcium 8.7 - 10.2 mg/dL 9.4  9.5  62.1   Total Protein 6.5 - 8.1 g/dL  6.9    Total Bilirubin 0.3 - 1.2 mg/dL  0.9    Alkaline Phos 38 - 126 U/L  63    AST 15 - 41 U/L  15    ALT 0 - 44 U/L  21      Lab Results  Component Value Date   CHOL 116 05/10/2022   HDL 45 05/10/2022   LDLCALC 58 05/10/2022   LDLDIRECT 119 (H) 02/22/2022   TRIG 63 05/10/2022   CHOLHDL 2.6 05/10/2022   No results for input(s): "LIPOA" in the last 8760 hours. No components found for: "NTPROBNP" Recent Labs    06/06/23 1633  PROBNP 1,093*   No results for input(s): "TSH" in the last 8760 hours.  External Labs: Collected: 05/08/2023 [facility] Everywhere Sodium 135, potassium 5.2, chloride 96, bicarb 30.7. BUN 35, creatinine 1.45. eGFR 56. Blood glucose 473. AST 9, ALT 16, alkaline phosphatase 76 Hemoglobin 15.3 g/dL BNP 308  Collected: 09/07/7844  A1c 10.8  Physical Exam:    Today's Vitals   06/06/23 1451  BP: 138/88  Pulse: (!) 104  Resp: 16  SpO2: 97%  Weight: 207 lb 12.8 oz (94.3 kg)  Height: 5\' 5"  (1.651 m)   Body mass index is 34.58 kg/m. Wt Readings from Last 3 Encounters:  06/06/23 207 lb 12.8 oz (94.3 kg)  12/10/22 209 lb 10.5 oz (95.1 kg)  12/07/22 209 lb 9.6 oz (95.1 kg)    Physical Exam  Constitutional: No distress. He appears chronically  ill.  Appears older than stated age, hemodynamically stable, ambulates w/ cane  HENT:  Poor dental hygiene  Neck: No JVD present.  Cardiovascular: Normal rate, regular rhythm, S1 normal, S2 normal, intact distal pulses and normal pulses. Exam reveals no gallop, no S3 and no S4.  Murmur heard. Crescendo-decrescendo midsystolic murmur is present with a grade of 3/6 at the upper right sternal border radiating to the neck. Pulses:      Carotid pulses are  on the right side with bruit and  on the left side with bruit. Pulmonary/Chest: Effort normal and breath sounds normal. No stridor. He has no wheezes. He has no rales.  Abdominal: Soft. Bowel sounds are normal. He exhibits no distension. There is no abdominal tenderness.  Musculoskeletal:        General: No edema.     Cervical back: Neck supple.  Neurological: He is alert and oriented to person, place, and time. He has intact cranial nerves (2-12).  Skin: Skin is warm and moist.   Impression & Recommendation(s):  Impression:   ICD-10-CM   1. Nonrheumatic aortic valve stenosis  I35.0 Basic metabolic panel    Pro b natriuretic peptide (BNP)    CBC    CBC    Pro b natriuretic peptide (BNP)    Basic metabolic panel    DISCONTINUED: losartan (COZAAR) 50 MG tablet    2. Atherosclerosis of aorta (HCC)  I70.0 EKG 12-Lead    3. Chronic HFrEF (heart failure with reduced ejection fraction) (HCC)  I50.22 Basic metabolic panel    Pro b natriuretic peptide (BNP)    CBC    CBC    Pro b natriuretic peptide (BNP)    Basic metabolic panel    4. LBBB (left bundle branch block)  I44.7     5. Essential hypertension  I10 hydrochlorothiazide (  HYDRODIURIL) 25 MG tablet    Basic metabolic panel    Basic metabolic panel    DISCONTINUED: losartan (COZAAR) 50 MG tablet    6. Type 2 diabetes mellitus with hyperglycemia, with long-term current use of insulin (HCC)  E11.65    Z79.4     7. Ascending aorta dilatation (HCC)  I77.810         Recommendation(s):  ***  Orders Placed:  Orders Placed This Encounter  Procedures  . Basic metabolic panel    Standing Status:   Future    Number of Occurrences:   1    Expected Date:   06/06/2023    Expiration Date:   06/05/2024  . Pro b natriuretic peptide (BNP)    Standing Status:   Future    Number of Occurrences:   1    Expected Date:   06/06/2023    Expiration Date:   06/05/2024  . CBC    Standing Status:   Future    Number of Occurrences:   1    Expected Date:   06/06/2023    Expiration Date:   06/05/2024  . EKG 12-Lead    As part of medical decision making ***  Final Medication List:    Meds ordered this encounter  Medications  . DISCONTD: metoprolol succinate (TOPROL XL) 25 MG 24 hr tablet    Sig: Take 1 tablet (25 mg total) by mouth daily. HOLD if systolic blood pressure (top number) is less than 100 and/ or if heart rate is less than 55.    Dispense:  30 tablet    Refill:  3  . DISCONTD: losartan (COZAAR) 50 MG tablet    Sig: Take 1 tablet (50 mg total) by mouth every evening.    Dispense:  90 tablet    Refill:  3  . hydrochlorothiazide (HYDRODIURIL) 25 MG tablet    Sig: Take 1 tablet (25 mg total) by mouth every morning.    Dispense:  90 tablet    Refill:  3    Medications Discontinued During This Encounter  Medication Reason  . losartan (COZAAR) 50 MG tablet Reorder  . hydrochlorothiazide (HYDRODIURIL) 25 MG tablet Reorder     Current Outpatient Medications:  .  atorvastatin (LIPITOR) 40 MG tablet, Take 1 tablet (40 mg total) by mouth at bedtime., Disp: 90 tablet, Rfl: 3 .  insulin lispro (HUMALOG) 100 UNIT/ML KwikPen, Inject 16 Units into the skin 2 (two) times daily with a meal., Disp: , Rfl:  .  Insulin NPH, Human,, Isophane, (HUMULIN N KWIKPEN) 100 UNIT/ML Kiwkpen, Inject 20 Units into the skin in the morning and at bedtime., Disp: , Rfl:  .  metFORMIN (GLUCOPHAGE-XR) 500 MG 24 hr tablet, Take 1,000 mg by mouth 2 (two) times daily., Disp: , Rfl:  .   TRULICITY 3 MG/0.5ML SOPN, SMARTSIG:0.5 Milliliter(s) SUB-Q Once a Week, Disp: , Rfl:  .  hydrochlorothiazide (HYDRODIURIL) 25 MG tablet, Take 1 tablet (25 mg total) by mouth every morning., Disp: 90 tablet, Rfl: 3 .  meloxicam (MOBIC) 15 MG tablet, Take 15 mg by mouth every morning., Disp: , Rfl:   Consent:   ***  Disposition:   *** Patient may be asked to follow-up sooner based on the results of the above-mentioned testing.  His questions and concerns were addressed to his satisfaction. He voices understanding of the recommendations provided during this encounter.    Signed, Delilah Shan, [facility]  [facility]  [address] [place] [address] [place], [place] [address] 06/15/2023 12:01 AM

## 2023-06-15 ENCOUNTER — Encounter: Payer: Self-pay | Admitting: Cardiology

## 2023-06-15 ENCOUNTER — Other Ambulatory Visit: Payer: Self-pay

## 2023-06-15 ENCOUNTER — Telehealth: Payer: Self-pay | Admitting: *Deleted

## 2023-06-15 DIAGNOSIS — R7989 Other specified abnormal findings of blood chemistry: Secondary | ICD-10-CM

## 2023-06-15 MED ORDER — TORSEMIDE 10 MG PO TABS
10.0000 mg | ORAL_TABLET | Freq: Every morning | ORAL | 3 refills | Status: DC
Start: 2023-06-15 — End: 2023-06-16

## 2023-06-15 NOTE — Telephone Encounter (Signed)
 Cardiac Catheterization scheduled at [facility] [id number] June 16, 2023 9 AM Arrival time [facility] Main Entrance A at: 7 AM  Nothing to eat after midnight prior to procedure, clear liquids until 5 AM day of procedure.  Medication instructions: -Hold:  Hydrochlorothiazide/Mobic-day before and day of procedure-per protocol GFR <60 (58)  Torsemide-pt to start after cath per notes.  Metformin-day of procedure and 48 hours post procedure  Insulin-AM of procedure-1/2 usual dose HS prior to procedure if taking  Trulicity weekly-ask day of week -hold morning of procedure -Other usual morning medications can be taken with sips of water including aspirin 81 mg.  Plan to go home the same day, you will only stay overnight if medically necessary.  You must have responsible adult to drive you home.  Someone must be with you the first 24 hours after you arrive home.  Left message for patient to call back to review procedure instructions.

## 2023-06-15 NOTE — Telephone Encounter (Signed)
No answer, voicemail message. 

## 2023-06-16 ENCOUNTER — Ambulatory Visit (HOSPITAL_COMMUNITY)
Admission: RE | Admit: 2023-06-16 | Discharge: 2023-06-16 | Disposition: A | Discharge status: Home / Self Care | Source: Ambulatory Visit | Attending: Internal Medicine | Admitting: Internal Medicine

## 2023-06-16 ENCOUNTER — Other Ambulatory Visit: Payer: Self-pay

## 2023-06-16 ENCOUNTER — Other Ambulatory Visit: Payer: Self-pay | Admitting: Cardiology

## 2023-06-16 ENCOUNTER — Encounter (HOSPITAL_COMMUNITY)
Admission: RE | Disposition: A | Discharge status: Home / Self Care | Payer: Self-pay | Source: Ambulatory Visit | Attending: Internal Medicine

## 2023-06-16 DIAGNOSIS — Z794 Long term (current) use of insulin: Secondary | ICD-10-CM | POA: Diagnosis not present

## 2023-06-16 DIAGNOSIS — I700 Atherosclerosis of aorta: Secondary | ICD-10-CM | POA: Diagnosis not present

## 2023-06-16 DIAGNOSIS — E1165 Type 2 diabetes mellitus with hyperglycemia: Secondary | ICD-10-CM | POA: Diagnosis not present

## 2023-06-16 DIAGNOSIS — I350 Nonrheumatic aortic (valve) stenosis: Secondary | ICD-10-CM | POA: Insufficient documentation

## 2023-06-16 DIAGNOSIS — I5022 Chronic systolic (congestive) heart failure: Secondary | ICD-10-CM | POA: Diagnosis not present

## 2023-06-16 DIAGNOSIS — I110 Hypertensive heart disease with heart failure: Secondary | ICD-10-CM | POA: Insufficient documentation

## 2023-06-16 DIAGNOSIS — I447 Left bundle-branch block, unspecified: Secondary | ICD-10-CM | POA: Insufficient documentation

## 2023-06-16 DIAGNOSIS — R7989 Other specified abnormal findings of blood chemistry: Secondary | ICD-10-CM

## 2023-06-16 HISTORY — PX: RIGHT HEART CATH AND CORONARY ANGIOGRAPHY: CATH118264

## 2023-06-16 LAB — POCT I-STAT 7, (LYTES, BLD GAS, ICA,H+H)
Acid-base deficit: 1 mmol/L (ref 0.0–2.0)
Acid-base deficit: 6 mmol/L — ABNORMAL HIGH (ref 0.0–2.0)
Bicarbonate: 21 mmol/L (ref 20.0–28.0)
Bicarbonate: 25.6 mmol/L (ref 20.0–28.0)
Calcium, Ion: 0.85 mmol/L — CL (ref 1.15–1.40)
Calcium, Ion: 1.23 mmol/L (ref 1.15–1.40)
HCT: 33 % — ABNORMAL LOW (ref 39.0–52.0)
HCT: 38 % — ABNORMAL LOW (ref 39.0–52.0)
Hemoglobin: 11.2 g/dL — ABNORMAL LOW (ref 13.0–17.0)
Hemoglobin: 12.9 g/dL — ABNORMAL LOW (ref 13.0–17.0)
O2 Saturation: 59 %
O2 Saturation: 89 %
Potassium: 3.4 mmol/L — ABNORMAL LOW (ref 3.5–5.1)
Potassium: 4.5 mmol/L (ref 3.5–5.1)
Sodium: 138 mmol/L (ref 135–145)
Sodium: 138 mmol/L (ref 135–145)
TCO2: 22 mmol/L (ref 22–32)
TCO2: 27 mmol/L (ref 22–32)
pCO2 arterial: 46.4 mmHg (ref 32–48)
pCO2 arterial: 47.3 mmHg (ref 32–48)
pH, Arterial: 7.264 — ABNORMAL LOW (ref 7.35–7.45)
pH, Arterial: 7.341 — ABNORMAL LOW (ref 7.35–7.45)
pO2, Arterial: 35 mmHg — CL (ref 83–108)
pO2, Arterial: 61 mmHg — ABNORMAL LOW (ref 83–108)

## 2023-06-16 LAB — POCT I-STAT EG7
Acid-base deficit: 2 mmol/L (ref 0.0–2.0)
Bicarbonate: 24.7 mmol/L (ref 20.0–28.0)
Calcium, Ion: 1.18 mmol/L (ref 1.15–1.40)
HCT: 38 % — ABNORMAL LOW (ref 39.0–52.0)
Hemoglobin: 12.9 g/dL — ABNORMAL LOW (ref 13.0–17.0)
O2 Saturation: 65 %
Potassium: 4.3 mmol/L (ref 3.5–5.1)
Sodium: 137 mmol/L (ref 135–145)
TCO2: 26 mmol/L (ref 22–32)
pCO2, Ven: 49.4 mmHg (ref 44–60)
pH, Ven: 7.307 (ref 7.25–7.43)
pO2, Ven: 37 mmHg (ref 32–45)

## 2023-06-16 LAB — GLUCOSE, CAPILLARY
Glucose-Capillary: 234 mg/dL — ABNORMAL HIGH (ref 70–99)
Glucose-Capillary: 233 mg/dL — ABNORMAL HIGH (ref 70–99)

## 2023-06-16 SURGERY — RIGHT HEART CATH AND CORONARY ANGIOGRAPHY
Anesthesia: LOCAL

## 2023-06-16 MED ORDER — VERAPAMIL HCL 2.5 MG/ML IV SOLN
INTRAVENOUS | Status: AC
  Filled 2023-06-16: qty 2

## 2023-06-16 MED ORDER — FENTANYL CITRATE (PF) 100 MCG/2ML IJ SOLN
INTRAMUSCULAR | Status: AC
  Filled 2023-06-16: qty 2

## 2023-06-16 MED ORDER — SODIUM CHLORIDE 0.9 % IV SOLN: 250.0000 mL | INTRAVENOUS | Status: DC | PRN

## 2023-06-16 MED ORDER — HEPARIN (PORCINE) IN NACL 1000-0.9 UT/500ML-% IV SOLN
INTRAVENOUS | Status: DC | PRN
  Administered 2023-06-16 (×2): 500 mL

## 2023-06-16 MED ORDER — VERAPAMIL HCL 2.5 MG/ML IV SOLN
INTRAVENOUS | Status: DC | PRN
  Administered 2023-06-16: 10 mL via INTRA_ARTERIAL

## 2023-06-16 MED ORDER — MIDAZOLAM HCL 2 MG/2ML IJ SOLN
INTRAMUSCULAR | Status: AC
  Filled 2023-06-16: qty 2

## 2023-06-16 MED ORDER — IOHEXOL 350 MG/ML SOLN
INTRAVENOUS | Status: DC | PRN
  Administered 2023-06-16: 40 mL via INTRA_ARTERIAL

## 2023-06-16 MED ORDER — SODIUM CHLORIDE 0.9% FLUSH: 3.0000 mL | Freq: Two times a day (BID) | INTRAVENOUS | Status: DC

## 2023-06-16 MED ORDER — ONDANSETRON HCL 4 MG/2ML IJ SOLN: 4.0000 mg | Freq: Four times a day (QID) | INTRAMUSCULAR | Status: DC | PRN

## 2023-06-16 MED ORDER — SODIUM CHLORIDE 0.9 % WEIGHT BASED INFUSION
3.0000 mL/kg/h | INTRAVENOUS | Status: AC
  Administered 2023-06-16: 3 mL/kg/h via INTRAVENOUS

## 2023-06-16 MED ORDER — HEPARIN SODIUM (PORCINE) 1000 UNIT/ML IJ SOLN
INTRAMUSCULAR | Status: AC
  Filled 2023-06-16: qty 10

## 2023-06-16 MED ORDER — ACETAMINOPHEN 325 MG PO TABS: 650.0000 mg | ORAL_TABLET | ORAL | Status: DC | PRN

## 2023-06-16 MED ORDER — HEPARIN SODIUM (PORCINE) 1000 UNIT/ML IJ SOLN
INTRAMUSCULAR | Status: DC | PRN
  Administered 2023-06-16: 5000 [IU] via INTRAVENOUS

## 2023-06-16 MED ORDER — FENTANYL CITRATE (PF) 100 MCG/2ML IJ SOLN
INTRAMUSCULAR | Status: DC | PRN
  Administered 2023-06-16: 25 ug via INTRAVENOUS

## 2023-06-16 MED ORDER — MIDAZOLAM HCL 2 MG/2ML IJ SOLN
INTRAMUSCULAR | Status: DC | PRN
  Administered 2023-06-16: 1 mg via INTRAVENOUS

## 2023-06-16 MED ORDER — LIDOCAINE HCL (PF) 1 % IJ SOLN
INTRAMUSCULAR | Status: AC
Start: 2023-06-16 — End: ?
  Filled 2023-06-16: qty 30

## 2023-06-16 MED ORDER — LIDOCAINE HCL (PF) 1 % IJ SOLN
INTRAMUSCULAR | Status: DC | PRN
  Administered 2023-06-16 (×2): 2 mL

## 2023-06-16 MED ORDER — ACETAMINOPHEN 325 MG PO TABS
650.0000 mg | ORAL_TABLET | ORAL | Status: DC | PRN
Start: 2023-06-16 — End: 2023-06-16

## 2023-06-16 MED ORDER — SODIUM CHLORIDE 0.9% FLUSH: 3.0000 mL | INTRAVENOUS | Status: DC | PRN

## 2023-06-16 MED ORDER — TORSEMIDE 20 MG PO TABS: 20.0000 mg | ORAL_TABLET | Freq: Two times a day (BID) | ORAL | 1 refills | Status: DC

## 2023-06-16 MED ORDER — LABETALOL HCL 5 MG/ML IV SOLN: 10.0000 mg | INTRAVENOUS | Status: DC | PRN

## 2023-06-16 MED ORDER — HYDRALAZINE HCL 20 MG/ML IJ SOLN: 10.0000 mg | INTRAMUSCULAR | Status: DC | PRN

## 2023-06-16 MED ORDER — FUROSEMIDE 10 MG/ML IJ SOLN: 40.0000 mg | Freq: Once | INTRAMUSCULAR | Status: DC

## 2023-06-16 MED ORDER — SODIUM CHLORIDE 0.9 % WEIGHT BASED INFUSION: 1.0000 mL/kg/h | INTRAVENOUS | Status: DC

## 2023-06-16 MED ORDER — FUROSEMIDE 10 MG/ML IJ SOLN: 60.0000 mg | Freq: Once | INTRAMUSCULAR | Status: DC

## 2023-06-16 MED ORDER — ASPIRIN 81 MG PO CHEW
81.0000 mg | CHEWABLE_TABLET | ORAL | Status: AC
  Administered 2023-06-16: 81 mg via ORAL
  Filled 2023-06-16: qty 1

## 2023-06-16 SURGICAL SUPPLY — 10 items
CATH BALLN WEDGE 5F 110CM (CATHETERS) IMPLANT
CATH INFINITI 5FR ANG PIGTAIL (CATHETERS) IMPLANT
CATH INFINITI AMBI 6FR TG (CATHETERS) IMPLANT
DEVICE RAD COMP TR BAND LRG (VASCULAR PRODUCTS) IMPLANT
GLIDESHEATH SLEND SS 6F .021 (SHEATH) IMPLANT
GUIDEWIRE .025 260CM (WIRE) IMPLANT
PACK CARDIAC CATHETERIZATION (CUSTOM PROCEDURE TRAY) ×1 IMPLANT
SET ATX-X65L (MISCELLANEOUS) IMPLANT
SHEATH GLIDE SLENDER 4/5FR (SHEATH) IMPLANT
WIRE EMERALD 3MM-J .035X260CM (WIRE) IMPLANT

## 2023-06-16 NOTE — Interval H&P Note (Signed)
 History and Physical Interval Note:  06/16/2023 7:20 AM  Jonathon Bray  has presented today for surgery, with the diagnosis of cardiomyopathy - aortic stent - valve replacement workup.  The various methods of treatment have been discussed with the patient and family. After consideration of risks, benefits and other options for treatment, the patient has consented to  Procedure(s): RIGHT/LEFT HEART CATH AND CORONARY ANGIOGRAPHY (N/A) as a surgical intervention.  The patient's history has been reviewed, patient examined, no change in status, stable for surgery.  I have reviewed the patient's chart and labs.  Questions were answered to the patient's satisfaction.     Orbie Pyo

## 2023-06-16 NOTE — Discharge Instructions (Addendum)
 Radial Site Care  This sheet gives you information about how to care for yourself after your procedure. Your health care provider may also give you more specific instructions. If you have problems or questions, contact your health care provider. What can I expect after the procedure? After the procedure, it is common to have: Bruising and tenderness at the catheter insertion area. Follow these instructions at home: Medicines Take over-the-counter and prescription medicines only as told by your health care provider. Insertion site care Follow instructions from your health care provider about how to take care of your insertion site. Make sure you: Wash your hands with soap and water before you remove your bandage (dressing). If soap and water are not available, use hand sanitizer. May remove dressing in 24 hours. Check your insertion site every day for signs of infection. Check for: Redness, swelling, or pain. Fluid or blood. Pus or a bad smell. Warmth. Do no take baths, swim, or use a hot tub for 5 days. You may shower 24-48 hours after the procedure. Remove the dressing and gently wash the site with plain soap and water. Pat the area dry with a clean towel. Do not rub the site. That could cause bleeding. Do not apply powder or lotion to the site. Activity  For 24 hours after the procedure, or as directed by your health care provider: Do not flex or bend the affected arm. Do not push or pull heavy objects with the affected arm. Do not drive yourself home from the hospital or clinic. You may drive 24 hours after the procedure. Do not operate machinery or power tools. KEEP ARM ELEVATED THE REMAINDER OF THE DAY. Do not push, pull or lift anything that is heavier than 10 lb for 5 days. Ask your health care provider when it is okay to: Return to work or school. Resume usual physical activities or sports. Resume sexual activity. General instructions If the catheter site starts to  bleed, raise your arm and put firm pressure on the site. If the bleeding does not stop, get help right away. This is a medical emergency. DRINK PLENTY OF FLUIDS FOR THE NEXT 2-3 DAYS. No alcohol consumption for 24 hours after receiving sedation. If you went home on the same day as your procedure, a responsible adult should be with you for the first 24 hours after you arrive home. Keep all follow-up visits as told by your health care provider. This is important. Contact a health care provider if: You have a fever. You have redness, swelling, or yellow drainage around your insertion site. Get help right away if: You have unusual pain at the radial site. The catheter insertion area swells very fast. The insertion area is bleeding, and the bleeding does not stop when you hold steady pressure on the area. Your arm or hand becomes pale, cool, tingly, or numb. These symptoms may represent a serious problem that is an emergency. Do not wait to see if the symptoms will go away. Get medical help right away. Call your local emergency services (911 in the [place] Do not drive yourself to the hospital. Summary After the procedure, it is common to have bruising and tenderness at the site. Follow instructions from your health care provider about how to take care of your radial site wound. Check the wound every day for signs of infection.  This information is not intended to replace advice given to you by your health care provider. Make sure you discuss any questions you have with  your health care provider. Document Revised: 04/27/2017 Document Reviewed: 04/27/2017 Elsevier Patient Education  2020 [employer]   Brachial Site Care   This sheet gives you information about how to care for yourself after your procedure. Your health care provider may also give you more specific instructions. If you have problems or questions, contact your health care provider. What can I expect after the procedure? After the  procedure, it is common to have: Bruising and tenderness at the catheter insertion area. Follow these instructions at home:  Insertion site care Follow instructions from your health care provider about how to take care of your insertion site. Make sure you: Wash your hands with soap and water before you change your bandage (dressing). If soap and water are not available, use hand sanitizer. Remove your dressing as told by your health care provider. In 24 hours Check your insertion site every day for signs of infection. Check for: Redness, swelling, or pain. Pus or a bad smell. Warmth. You may shower 24-48 hours after the procedure. Do not apply powder or lotion to the site.  Activity For 24 hours after the procedure, or as directed by your health care provider: Do not push or pull heavy objects with the affected arm. Do not drive yourself home from the hospital or clinic. You may drive 24 hours after the procedure unless your health care provider tells you not to. Do not lift anything that is heavier than 10 lb (4.5 kg), or the limit that you are told, until your health care provider says that it is safe.  For 24 hours     RESTART METFORMIN ON June 18, 2023

## 2023-06-16 NOTE — Telephone Encounter (Signed)
Procedure done today.

## 2023-06-16 NOTE — Progress Notes (Signed)
 Torsemide increased to 20mg  BID with follow up BMET ordered per Dr. Lynnette Caffey

## 2023-06-17 ENCOUNTER — Encounter (HOSPITAL_COMMUNITY): Payer: Self-pay | Admitting: Internal Medicine

## 2023-06-28 ENCOUNTER — Other Ambulatory Visit: Payer: Self-pay | Admitting: Thoracic Surgery (Cardiothoracic Vascular Surgery)

## 2023-06-28 ENCOUNTER — Institutional Professional Consult (permissible substitution): Admitting: Thoracic Surgery (Cardiothoracic Vascular Surgery)

## 2023-06-28 VITALS — BP 128/83 | HR 112 | Resp 18 | Ht 65.0 in | Wt 201.0 lb

## 2023-06-28 DIAGNOSIS — I350 Nonrheumatic aortic (valve) stenosis: Secondary | ICD-10-CM

## 2023-06-28 NOTE — Progress Notes (Signed)
 PCP is Ignatius Specking, MD Referring Provider is Orbie Pyo, MD  Chief Complaint  Patient presents with   Aortic Stenosis    Cath 3/13, ECHO 2/27    HPI: Jonathon Bray is sent for consultation regarding severe aortic stenosis.  Jonathon Bray is a 58 year old man with a history of hypertension, type 2 insulin-dependent diabetes, stage II chronic kidney disease, obesity ascending aortic ectasia, and aortic stenosis.    He has a history of aortic stenosis and was being monitored with serial echocardiograms, although he seems surprised by that information.    He saw Dr. Odis Hollingshead in early March.  His echocardiogram showed progression of aortic stenosis from moderately severe to severe and left ventricular ejection fraction decreased from 60 to 70% to 40 to 45%.  EKG showed a new left bundle branch block.  He had a noncontrast CT which showed his ascending aorta at about 3.9 to 4 cm.  There was heavy calcification of the aortic valve and some evidence of thoracic aortic atherosclerosis.  Cardiac catheterization showed no significant coronary artery disease.  The aortic valve could not be crossed.  Right heart cath showed right ventricular pressure of 71/11 with PA pressure of 70/38 with a wedge of 33.  Fick cardiac index was 2.1 L/min/m.  He denies chest pain, pressure, tightness, shortness of breath.  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  Denies any peripheral edema.  Has lost about 70 pounds over the past year.  Had a fall about a month ago due to weakness in his legs.  Has been a problem for him since he broke his back about 2-1/2 years ago.  Did not have any presyncopal or syncopal symptoms.  Walks with a cane.  Past Medical History:  Diagnosis Date   Aortic stenosis    Diabetes mellitus without complication (HCC)    type two - LIBRE SYSTEM ON RIGHT UPPER OUTER ARM   Hypertension    PONV (postoperative nausea and vomiting)     Past Surgical History:   Procedure Laterality Date   COLONOSCOPY  2020   CORONARY ANGIOGRAPHY N/A 05/10/2022   Procedure: CORONARY ANGIOGRAPHY;  Surgeon: Yates Decamp, MD;  Location: [facility];  Service: Cardiovascular;  Laterality: N/A;   KYPHOPLASTY N/A 06/19/2020   Procedure: ATTEMPTED LUMBAR TWO KYPHOPLASTY;  Surgeon: Coletta Memos, MD;  Location: [facility] OR;  Service: Neurosurgery;  Laterality: N/A;   RIGHT HEART CATH AND CORONARY ANGIOGRAPHY N/A 06/16/2023   Procedure: RIGHT HEART CATH AND CORONARY ANGIOGRAPHY;  Surgeon: Orbie Pyo, MD;  Location: [facility];  Service: Cardiovascular;  Laterality: N/A;    Family History  Problem Relation Age of Onset   Diabetes Mother    Diabetes Father    Heart disease Father    Heart attack Sister    Hypertension Sister    Heart attack Brother    Other Brother 45       Shot in the head   Other Brother 5       Polio   Diabetes Brother     Social History Social History   Tobacco Use   Smoking status: Never   Smokeless tobacco: Never  Vaping Use   Vaping status: Never Used  Substance Use Topics   Alcohol use: No   Drug use: No    Current Outpatient Medications  Medication Sig Dispense Refill   atorvastatin (LIPITOR) 40 MG tablet Take 1 tablet (40 mg total) by mouth at  bedtime. 90 tablet 3   hydrochlorothiazide (HYDRODIURIL) 25 MG tablet Take 1 tablet (25 mg total) by mouth every morning. 90 tablet 3   insulin lispro (HUMALOG) 100 UNIT/ML KwikPen Inject 16 Units into the skin 2 (two) times daily with a meal.     Insulin NPH, Human,, Isophane, (HUMULIN N KWIKPEN) 100 UNIT/ML Kiwkpen Inject 20 Units into the skin in the morning and at bedtime.     meloxicam (MOBIC) 15 MG tablet Take 15 mg by mouth every morning.     metFORMIN (GLUCOPHAGE-XR) 500 MG 24 hr tablet Take 1,000 mg by mouth 2 (two) times daily.     torsemide (DEMADEX) 20 MG tablet Take 1 tablet (20 mg total) by mouth 2 (two) times daily. 60 tablet 1   No current facility-administered  medications for this visit.    Allergies  Allergen Reactions   Invokana [Canagliflozin] Other (See Comments)    Dizziness Syncope    Levaquin [Levofloxacin] Rash   Zestril [Lisinopril] Cough    Review of Systems  Constitutional:  Negative for activity change, appetite change and unexpected weight change (Has lost 70 pounds intentionally).  HENT:  Negative for trouble swallowing and voice change.   Eyes:  Negative for visual disturbance.  Respiratory:  Negative for cough, shortness of breath and wheezing.   Cardiovascular:  Negative for chest pain and leg swelling.  Genitourinary:  Negative for difficulty urinating and dysuria.  Musculoskeletal:  Positive for back pain and gait problem.  Neurological:  Positive for weakness (Leg secondary to back injury).  Hematological:  Negative for adenopathy. Does not bruise/bleed easily.  All other systems reviewed and are negative.   BP 128/83 (BP Location: Right Arm)   Pulse (!) 112   Resp 18   Ht 5\' 5"  (1.651 m)   Wt 201 lb (91.2 kg)   SpO2 95%   BMI 33.45 kg/m  Physical Exam Vitals reviewed.  Constitutional:      General: He is not in acute distress.    Appearance: Normal appearance.  HENT:     Head: Normocephalic and atraumatic.  Eyes:     General: No scleral icterus.    Extraocular Movements: Extraocular movements intact.  Neck:     Vascular: Carotid bruit (Transmitted murmurs) present.  Cardiovascular:     Rate and Rhythm: Normal rate and regular rhythm.     Heart sounds: Murmur (3/6 decrescendo systolic murmur right upper sternal border) heard.  Pulmonary:     Effort: Pulmonary effort is normal. No respiratory distress.     Breath sounds: Normal breath sounds. No wheezing or rales.  Abdominal:     General: There is no distension.     Palpations: Abdomen is soft.  Musculoskeletal:     Cervical back: Neck supple.     Right lower leg: No edema.     Left lower leg: No edema.  Skin:    General: Skin is warm and dry.   Neurological:     General: No focal deficit present.     Mental Status: He is alert and oriented to person, place, and time.     Cranial Nerves: No cranial nerve deficit.    Diagnostic Tests: Echocardiogram 06/02/2023 IMPRESSIONS     1. Left ventricular ejection fraction, by estimation, is 35 to 40%. The  left ventricle has moderately decreased function. The left ventricle has  no regional wall motion abnormalities. Left ventricular diastolic  parameters are consistent with Grade I  diastolic dysfunction (impaired relaxation). Elevated left ventricular  end-diastolic pressure.   2. Right ventricular systolic function is normal. The right ventricular  size is normal. There is normal pulmonary artery systolic pressure.   3. The mitral valve is normal in structure. No evidence of mitral valve  regurgitation. No evidence of mitral stenosis.   4. The aortic valve is calcified. There is severe calcifcation of the  aortic valve. There is severe thickening of the aortic valve. Aortic valve  regurgitation is trivial. Severe aortic valve stenosis. Aortic valve area,  by VTI measures 0.74 cm. Aortic  valve mean gradient measures 47.0 mmHg. Aortic valve Vmax measures 3.90  m/s.   5. The inferior vena cava is normal in size with greater than 50%  respiratory variability, suggesting right atrial pressure of 3 mmHg.   I personally reviewed the relevant echo images.  There is left nuclear systolic dysfunction with ejection fraction approximately 40%.  Severe calcification and limited excursion of aortic valve leaflets.  Aortic valve area calculated at 0.74 cm with a mean gradient of 47 mmHg  Cardiac catheterization 06/16/2023 1.  Normal right dominant circulation. 2.  Fick cardiac output of 4.2 L/min Fick cardiac index of 2.1 L/min/m with the following hemodynamics:            Right atrial pressure mean of 19 mmHg            Right ventricular pressure 71/11 with an end-diastolic pressure of 24  mmHg            PA pressure 70/38 with a mean of 52 mmHg            Mean wedge pressure of 33 mmHg V waves to 45 mmHg            PVR 4.5 Woods units            PA pulsatility index of 1.7  I personally reviewed the catheterization images.  No significant CAD.  Impression: Jonathon Bray is a 58 year old man with a history of hypertension, type 2 insulin-dependent diabetes, stage II chronic kidney disease, obesity ascending aortic ectasia, and aortic stenosis.    Aortic stenosis-likely bicuspid aortic valve.  Now has severe stenosis with a calculated gradient of 47 mmHg and a valve area 0.74 cm.  In addition has new left ventricular systolic dysfunction with ejection fraction dropping from 60% to approximately 40% over the past 6 months.  Aortic valve replacement is indicated due to progression and worsening left ventricular function even though he is asymptomatic.  I had a long discussion with Jonathon Bray and his wife regarding the findings on echocardiogram and catheterization and his CT.  We discussed the indications for aortic valve replacement.  We discussed the general nature of the procedure including the need for general anesthesia, the incision to be used, use of cardiopulmonary bypass, the use of drainage tubes and temporary pacemaker wires postoperatively, the expected hospital stay, and the overall recovery.  I informed them of the indications, risks, benefits, and alternatives.  They understand the risks include, but not limited to death, MI, DVT, PE, bleeding, possible need for transfusion, infection, cardiac arrhythmias, heart block requiring pacemaker placement, respiratory or renal failure, as well as possibility of other unforeseeable complications.  He he is willing to accept the risks and agrees to proceed.  We did discuss the options of tissue and mechanical valves.  We discussed relative advantages and disadvantages of each as relates to longevity and need for anticoagulation.   He would prefer mechanical valve to decrease  the possible need for reintervention.  He understands he would require lifelong anticoagulation, and the importance of medical compliance and monitoring.  Has not seen a dentist in about a year and needs a dental cleaning prior to surgery.  His most recent A1c was about 10.8.  Most recently his blood sugar was noted to be over 400.  He says that he is typically running in the 170-180 range.  Currently taking metformin 1000 mg twice daily, Humulin and 20 units twice daily, and Humalog 16 units with meals.  Does not adjust based on his blood sugars.  He has an appointment with his endocrinologist next week.  Plan: Check hemoglobin A1c Follow-up with endocrinologist to optimize blood sugars prior to surgery Dental visit for checkup and cleaning Plan AVR after dental visit, patient will call to schedule once he knows the date.   Loreli Slot, MD [facility] [phone]

## 2023-06-29 LAB — BASIC METABOLIC PANEL
BUN/Creatinine Ratio: 28 — ABNORMAL HIGH (ref 9–20)
BUN: 50 mg/dL — ABNORMAL HIGH (ref 6–24)
CO2: 25 mmol/L (ref 20–29)
Calcium: 10.1 mg/dL (ref 8.7–10.2)
Chloride: 84 mmol/L — ABNORMAL LOW (ref 96–106)
Creatinine, Ser: 1.8 mg/dL — ABNORMAL HIGH (ref 0.76–1.27)
Glucose: 371 mg/dL — ABNORMAL HIGH (ref 70–99)
Potassium: 4.8 mmol/L (ref 3.5–5.2)
Sodium: 132 mmol/L — ABNORMAL LOW (ref 134–144)
eGFR: 43 mL/min/{1.73_m2} — ABNORMAL LOW (ref >59)

## 2023-06-29 LAB — PRO B NATRIURETIC PEPTIDE: NT-Pro BNP: 1172 pg/mL — ABNORMAL HIGH (ref 0–210)

## 2023-06-30 ENCOUNTER — Other Ambulatory Visit: Payer: Self-pay

## 2023-06-30 DIAGNOSIS — R7989 Other specified abnormal findings of blood chemistry: Secondary | ICD-10-CM

## 2023-06-30 MED ORDER — TORSEMIDE 20 MG PO TABS: 20.0000 mg | ORAL_TABLET | Freq: Every day | ORAL | Status: DC

## 2023-07-12 ENCOUNTER — Telehealth: Payer: Self-pay | Admitting: Cardiology

## 2023-07-14 ENCOUNTER — Telehealth: Payer: Self-pay | Admitting: Cardiology

## 2023-07-14 NOTE — Telephone Encounter (Signed)
.     [facility] Pre-operative Risk Assessment    Request for surgical clearance:  What type of surgery is being performed?  Deep cleaning  When is this surgery scheduled?  TBD - ASAP preferably    What type of clearance is required (medical clearance vs. Pharmacy clearance to hold med vs. Both)?  Both   Are there any medications that need to be held prior to surgery and how long? Please advise on medications + provide med list if possible.   Practice name and name of physician performing surgery?  [facility]  Thyra Breed, DDS  What is your office phone number? [phone]   7.   What is your office fax number? [phone]   8.   Anesthesia type (None, local, MAC, general)?  Local    Rolly Pancake 07/14/2023, 12:09 PM

## 2023-07-14 NOTE — Telephone Encounter (Signed)
   Patient Name: Jonathon Bray  DOB: 10-Apr-1965 MRN: [id number]  Primary Cardiologist: Tessa Lerner, DO  Chart reviewed as part of pre-operative protocol coverage.   Simple dental extractions/ Cleaning (i.e. 1-2 teeth) are considered low risk procedures per guidelines and generally do not require any specific cardiac clearance. It is also generally accepted that for simple extractions and dental cleanings, there is no need to interrupt blood thinner therapy.  SBE prophylaxis is not required for the patient from a cardiac standpoint.  I will route this recommendation to the requesting party via Epic fax function and remove from pre-op pool.  Please call with questions.  Napoleon Form, Leodis Rains, NP 07/14/2023, 12:18 PM

## 2023-07-16 LAB — BASIC METABOLIC PANEL WITH GFR
BUN/Creatinine Ratio: 30 — ABNORMAL HIGH (ref 9–20)
BUN: 36 mg/dL — ABNORMAL HIGH (ref 6–24)
CO2: 30 mmol/L — ABNORMAL HIGH (ref 20–29)
Calcium: 10.1 mg/dL (ref 8.7–10.2)
Chloride: 91 mmol/L — ABNORMAL LOW (ref 96–106)
Creatinine, Ser: 1.19 mg/dL (ref 0.76–1.27)
Glucose: 420 mg/dL — ABNORMAL HIGH (ref 70–99)
Potassium: 4.7 mmol/L (ref 3.5–5.2)
Sodium: 134 mmol/L (ref 134–144)
eGFR: 71 mL/min/{1.73_m2} (ref >59)

## 2023-07-18 ENCOUNTER — Ambulatory Visit: Admitting: Cardiology

## 2023-07-18 ENCOUNTER — Telehealth: Payer: Self-pay

## 2023-07-18 NOTE — Telephone Encounter (Signed)
 LMTCB to discuss pushing out the rescheduled appt from today in order for the pt to be on the updated Torsemide dose a little longer and have the ability to get updated labs prior to the appt.

## 2023-07-19 ENCOUNTER — Other Ambulatory Visit: Payer: Self-pay

## 2023-07-19 DIAGNOSIS — I10 Essential (primary) hypertension: Secondary | ICD-10-CM

## 2023-07-19 DIAGNOSIS — R7989 Other specified abnormal findings of blood chemistry: Secondary | ICD-10-CM

## 2023-07-19 NOTE — Telephone Encounter (Signed)
 Patient is returning call.

## 2023-07-19 NOTE — Telephone Encounter (Signed)
 Spoke with pt over the phone and explained that Dr. Albert Huff would like to push out the pt's appt that is scheduled for tomorrow 4/16 one week to allow for more time on the lower Torsemide dose (20 mg) and do one more set of repeat labs prior to coming into the office. Pt verbalized understanding and agreed with the plan. Appt moved to 4/23 at 11:40 AM and repeat BMP has been ordered and released in the system. Pt advised to get lab drawn either 4/18 or the morning of 4/21. Pt verbalized understanding.

## 2023-07-20 ENCOUNTER — Ambulatory Visit: Admitting: Cardiology

## 2023-07-23 ENCOUNTER — Telehealth: Payer: Self-pay | Admitting: Internal Medicine

## 2023-07-23 LAB — BASIC METABOLIC PANEL WITH GFR
BUN/Creatinine Ratio: 34 — ABNORMAL HIGH (ref 9–20)
BUN: 54 mg/dL — ABNORMAL HIGH (ref 6–24)
CO2: 25 mmol/L (ref 20–29)
Calcium: 9.5 mg/dL (ref 8.7–10.2)
Chloride: 84 mmol/L — ABNORMAL LOW (ref 96–106)
Creatinine, Ser: 1.6 mg/dL — ABNORMAL HIGH (ref 0.76–1.27)
Glucose: 545 mg/dL (ref 70–99)
Potassium: 4.5 mmol/L (ref 3.5–5.2)
Sodium: 129 mmol/L — ABNORMAL LOW (ref 134–144)
eGFR: 50 mL/min/{1.73_m2} — ABNORMAL LOW (ref >59)

## 2023-07-23 NOTE — Progress Notes (Signed)
 These labs were done while he was taking Torsemide  20 mg po qday and hydrochlorothiazide  25 mg qday.   Sugars are poorly controlled - dating back several months. Myself and CT surgery team have educated him that sugars need to be better controlled prior to valve replacement to ensure he has good wound healing and outcomes. Patient states that he as an apt to see his endocrinologist in May. He will try to move up that appt.   Given his renal function - I has asked him to hold torsemide  and will see him on Wednesday at his appt.   Avoid / stop NSAIDs.   Given the fact that his sugars are greater than 500, pseudohyponatremia, renal function, and yesterday he was feeling lightheaded and dizzy.   Given the labs, symptoms, aortic stenosis and heart failure at baseline I have asked him to go to the hospital for further evaluation and management.  Patient verbalized understanding and states that he will go to the ER today.   In addition, he has poor insight on his co-morbidities and difficultly in uptitrate of GDMT and follow up. If admitted will see if we can coordinate care sooner.   Will re-evaluate him on Wednesday, check renal function / ntprobnp, and restart diuretics +/- GDMT.  Jonathon Bray Richton, DO, [facility]

## 2023-07-23 NOTE — Telephone Encounter (Signed)
 Received a call from [facility] through our after hours cardiology line regarding a glucose level of 524 for patient.

## 2023-07-27 ENCOUNTER — Ambulatory Visit: Attending: Cardiology | Admitting: Cardiology

## 2023-07-27 VITALS — BP 124/98 | HR 91 | Resp 16 | Ht 65.0 in | Wt 201.0 lb

## 2023-07-27 DIAGNOSIS — I447 Left bundle-branch block, unspecified: Secondary | ICD-10-CM | POA: Diagnosis not present

## 2023-07-27 DIAGNOSIS — I350 Nonrheumatic aortic (valve) stenosis: Secondary | ICD-10-CM

## 2023-07-27 DIAGNOSIS — I10 Essential (primary) hypertension: Secondary | ICD-10-CM | POA: Diagnosis not present

## 2023-07-27 DIAGNOSIS — Z794 Long term (current) use of insulin: Secondary | ICD-10-CM

## 2023-07-27 DIAGNOSIS — E1165 Type 2 diabetes mellitus with hyperglycemia: Secondary | ICD-10-CM

## 2023-07-27 DIAGNOSIS — I5022 Chronic systolic (congestive) heart failure: Secondary | ICD-10-CM | POA: Diagnosis not present

## 2023-07-27 DIAGNOSIS — I77810 Thoracic aortic ectasia: Secondary | ICD-10-CM

## 2023-07-27 MED ORDER — METOPROLOL SUCCINATE ER 25 MG PO TB24: 25.0000 mg | ORAL_TABLET | Freq: Every day | ORAL | 3 refills | Status: DC

## 2023-07-27 NOTE — Patient Instructions (Signed)
 Medication Instructions:  Your physician has recommended you make the following change in your medication:   STOP Meloxicam (Mobic)  START Metoprolol  Succinate (Toprol -XL) 25 mg once daily in the morning   *If you need a refill on your cardiac medications before your next appointment, please call your pharmacy*  Lab Work: To be completed today: BMP and Pro-BNP  If you have labs (blood work) drawn today and your tests are completely normal, you will receive your results only by: MyChart Message (if you have MyChart) OR A paper copy in the mail If you have any lab test that is abnormal or we need to change your treatment, we will call you to review the results.  Testing/Procedures: None ordered today.  Follow-Up: At [facility], you and your health needs are our priority.  As part of our continuing mission to provide you with exceptional heart care, we have created designated Provider Care Teams.  These Care Teams include your primary Cardiologist (physician) and Advanced Practice Providers (APPs -  Physician Assistants and Nurse Practitioners) who all work together to provide you with the care you need, when you need it.  We recommend signing up for the patient portal called "MyChart".  Sign up information is provided on this After Visit Summary.  MyChart is used to connect with patients for Virtual Visits (Telemedicine).  Patients are able to view lab/test results, encounter notes, upcoming appointments, etc.  Non-urgent messages can be sent to your provider as well.   To learn more about what you can do with MyChart, go to [email or web address].    Your next appointment:   3 month(s)  The format for your next appointment:   In Person  Provider:   Olinda Bertrand, [facility]  Other Instructions When you get a clearance from your dentist and a good diabetes regimen from your endocrinologist, call Dr. Audree Bless office to proceed with your procedure.

## 2023-07-27 NOTE — Progress Notes (Unsigned)
 Cardiology Office Note:  .   ID:  Jonathon Bray, DOB [birth year], MRN [id number] PCP:  Orlena Bitters, MD  Former Cardiology Providers: N/A [facility] Providers Cardiologist:  Olinda Bertrand, DO , [facility] (established care 01/18/2022) Electrophysiologist:  None  Click to update primary MD,subspecialty MD or APP then REFRESH:1}    Chief Complaint  Patient presents with  . Nonrheumatic aortic valve stenosis  . Follow-up    History of Present Illness: .   Jonathon Bray is a 58 y.o. 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   Patient has known history of aortic stenosis which is being monitored by serial echocardiogram.  Recently he is noted to have reduction in LVEF and progression of his aortic stenosis.  He underwent left and right heart catheterization and referred to cardiothoracic surgery for aortic valve replacement.  CT surgery recommended dental clearance, better glycemic control, and scheduling surgery electively.  In the interim, his right heart catheterization noted elevated filling pressures and diuretics were uptitrated.  With diuretics his shortness of breath has improved but developed renal insufficiency.  In addition labs from 07/22/2023 illustrate significant hyperglycemia with sugars of 545 and pseudohyponatremia.  Due to concerns for possible DKA he was requested to go to the ER for further evaluation and management but chose not to.  Patient presents today for follow-up.  Patient denies anginal chest pain or heart failure symptoms.  Since last office visit he is lost 6 pounds with diuresis and feels much better.  However his labs from  07/22/2018 illustrated severe hyperglycemia, pseudohyponatremia, and renal insufficiency.  I had requested that he hold off his hydrochlorothiazide  and torsemide  until being evaluated today.  He has followed these instructions.  Patient states that he did follow-up with his dentist and needs to be scheduled for cleaning.  He also has an appointment with endocrinologist on Aug 08, 2023 to further uptitrate his medications from a diabetes standpoint.   Review of Systems: .   Review of Systems  Constitutional: Positive for weight loss.  Cardiovascular:  Negative for chest pain, claudication, irregular heartbeat, leg swelling, near-syncope, orthopnea, palpitations, paroxysmal nocturnal dyspnea and syncope.  Respiratory:  Negative for shortness of breath.   Hematologic/Lymphatic: Negative for bleeding problem.  Musculoskeletal:  Negative for falls and joint pain.       Gait difficulties, walking with a cane    Studies Reviewed:   EKG: EKG Interpretation Date/Time:  Wednesday July 27 2023 12:05:53 EDT Ventricular Rate:  92 PR Interval:  154 QRS Duration:  84 QT Interval:  346 QTC Calculation: 427 R Axis:   37  Text Interpretation: Normal sinus rhythm Nonspecific T wave abnormality When compared with ECG of 06-Jun-2023 14:52, Left bundle branch block is no longer Present Confirmed by Olinda Bertrand [phone]) on 07/27/2023 12:13:04 PM  Echocardiogram: 05/31/2022: LVEF 60-65%, Moderate to severe aortic valve stenosis (peak velocity 3.47m/s, MG 37.62mmHg, DI 0.20).   06/02/2023:  1. Left ventricular ejection fraction, by estimation, is 35 to 40%. The  left ventricle has moderately decreased function. The left ventricle has  no regional wall motion abnormalities. Left ventricular diastolic  parameters are consistent with Grade I  diastolic dysfunction (impaired relaxation). Elevated left ventricular  end-diastolic pressure.   2. Right ventricular systolic function  is normal. The right ventricular   size is normal. There is normal pulmonary artery systolic pressure.   3. The mitral valve is normal in structure. No evidence of mitral valve  regurgitation. No evidence of mitral stenosis.   4. The aortic valve is calcified. There is severe calcifcation of the  aortic valve. There is severe thickening of the aortic valve. Aortic valve  regurgitation is trivial. Severe aortic valve stenosis. Aortic valve area,  by VTI measures 0.74 cm. Aortic  valve mean gradient measures 47.0 mmHg. Aortic valve Vmax measures 3.90  m/s.   5. The inferior vena cava is normal in size with greater than 50%  respiratory variability, suggesting right atrial pressure of 3 mmHg.   L/RHCP 06/2023: 1.  Normal right dominant circulation. 2.  Fick cardiac output of 4.2 L/min Fick cardiac index of 2.1 L/min/m with the following hemodynamics:            Right atrial pressure mean of 19 mmHg            Right ventricular pressure 71/11 with an end-diastolic pressure of 24 mmHg            PA pressure 70/38 with a mean of 52 mmHg            Mean wedge pressure of 33 mmHg V waves to 45 mmHg            PVR 4.5 Woods units            PA pulsatility index of 1.7   Recommendation: Will refer for surgical aortic valve replacement.  Given elevated filling pressures, 60 mg of Lasix  IV x 1 will be administered following the cardiac catheterization and the patient's torsemide  10 mg daily will be increased to 20 mg twice daily.  A BMP will be checked next week.  The patient should be aggressively diuresed prior to surgical aortic valve replacement.   Consider repeat right heart catheterization prior to surgery to ensure volume optimization.   Cardiac MRI  05/11/2022: Modified [facility] Criteria is met for myocarditis.   LGE pattern can also be seen in obstructive coronary disease, but in lieu of this, with slight pericardial thickening and with lack of wall motion abnormality, study is most consistent with myocarditis.   At  least moderate aortic stenosis.   Carotid Duplex 02/16/2022: Right Carotid: The extracranial vessels were near-normal with only minimal wall thickening or plaque.  Left Carotid: The extracranial vessels were near-normal with only minimal wall thickening or plaque.  Vertebrals: Bilateral vertebral arteries demonstrate antegrade flow.  Subclavians: Normal flow hemodynamics were seen in bilateral subclavian arteries.   Risk Assessment/Calculations:   NA   Labs:       Latest Ref Rng & Units 06/16/2023    9:13 AM 06/16/2023    9:11 AM 06/16/2023    9:08 AM  CBC  Hemoglobin 13.0 - 17.0 g/dL 40.9  81.1  91.4   Hematocrit 39.0 - 52.0 % 33.0  38.0  38.0        Latest Ref Rng & Units 07/22/2023    1:20 PM 07/15/2023    1:52 PM 06/28/2023    2:02 PM  BMP  Glucose 70 - 99 mg/dL 782  956  213   BUN 6 - 24 mg/dL 54  36  50   Creatinine 0.76 - 1.27 mg/dL 0.86  5.78  4.69   BUN/Creat Ratio 9 - 20 34  30  28   Sodium 134 -  144 mmol/L 129  134  132   Potassium 3.5 - 5.2 mmol/L 4.5  4.7  4.8   Chloride 96 - 106 mmol/L 84  91  84   CO2 20 - 29 mmol/L 25  30  25    Calcium  8.7 - 10.2 mg/dL 9.5  16.1  09.6       Latest Ref Rng & Units 07/22/2023    1:20 PM 07/15/2023    1:52 PM 06/28/2023    2:02 PM  CMP  Glucose 70 - 99 mg/dL 045  409  811   BUN 6 - 24 mg/dL 54  36  50   Creatinine 0.76 - 1.27 mg/dL 9.14  7.82  9.56   Sodium 134 - 144 mmol/L 129  134  132   Potassium 3.5 - 5.2 mmol/L 4.5  4.7  4.8   Chloride 96 - 106 mmol/L 84  91  84   CO2 20 - 29 mmol/L 25  30  25    Calcium  8.7 - 10.2 mg/dL 9.5  21.3  08.6     Lab Results  Component Value Date   CHOL 116 05/10/2022   HDL 45 05/10/2022   LDLCALC 58 05/10/2022   LDLDIRECT 119 (H) 02/22/2022   TRIG 63 05/10/2022   CHOLHDL 2.6 05/10/2022   No results for input(s): "LIPOA" in the last 8760 hours. No components found for: "NTPROBNP" Recent Labs    06/06/23 1633 06/28/23 1402  PROBNP 1,093* 1,172*   No results for input(s):  "TSH" in the last 8760 hours.  External Labs: Collected: 05/08/2023 [facility] health care Care Everywhere Sodium 135, potassium 5.2, chloride 96, bicarb 30.7. BUN 35, creatinine 1.45. eGFR 56. Blood glucose 473. AST 9, ALT 16, alkaline phosphatase 76 Hemoglobin 15.3 g/dL BNP 578  Collected: 07/10/9627  A1c 10.8  Physical Exam:    Today's Vitals   07/27/23 1208  BP: (!) 124/98  Pulse: 91  Resp: 16  SpO2: 97%  Weight: 201 lb (91.2 kg)  Height: 5\' 5"  (1.651 m)   Body mass index is 33.45 kg/m. Wt Readings from Last 3 Encounters:  07/27/23 201 lb (91.2 kg)  06/28/23 201 lb (91.2 kg)  06/16/23 208 lb (94.3 kg)    Physical Exam  Constitutional: No distress. He appears chronically ill.  Appears older than stated age, hemodynamically stable, ambulates w/ cane  HENT:  Poor dental hygiene  Neck: No JVD present.  Cardiovascular: Normal rate, regular rhythm, S1 normal, S2 normal, intact distal pulses and normal pulses. Exam reveals no gallop, no S3 and no S4.  Murmur heard. Crescendo-decrescendo midsystolic murmur is present with a grade of 3/6 at the upper right sternal border radiating to the neck. Pulses:      Carotid pulses are  on the right side with bruit and  on the left side with bruit. Pulmonary/Chest: Effort normal and breath sounds normal. No stridor. He has no wheezes. He has no rales.  Abdominal: Soft. Bowel sounds are normal. He exhibits no distension. There is no abdominal tenderness.  Musculoskeletal:        General: No edema.     Cervical back: Neck supple.  Neurological: He is alert and oriented to person, place, and time. He has intact cranial nerves (2-12).  Skin: Skin is warm and moist.   Impression & Recommendation(s):  Impression:   ICD-10-CM   1. Nonrheumatic aortic valve stenosis  I35.0 EKG 12-Lead    2. Chronic HFrEF (heart failure with reduced ejection fraction) (HCC)  I50.22  Basic metabolic panel with GFR    Pro b natriuretic peptide (BNP)    Pro b  natriuretic peptide (BNP)    Basic metabolic panel with GFR    3. LBBB (left bundle branch block)  I44.7     4. Essential hypertension  I10     5. Type 2 diabetes mellitus with hyperglycemia, with long-term current use of insulin  (HCC)  E11.65    Z79.4     6. Ascending aorta dilatation (HCC)  I77.810        Recommendation(s):  Nonrheumatic aortic valve stenosis Acute and progressive Echocardiogram 05/31/2022: LVEF 60-65%. Echocardiogram February 2025 illustrates severe aortic stenosis with newly developed reduced LVEF at 35-40%  ***   Chronic HFrEF (heart failure with reduced ejection fraction) (HCC)-newly discovered LBBB (left bundle branch block)-newly discovered History of idiopathic myocarditis February 2025: LVEF 35-40%, grade 1 diastolic dysfunction, severe aortic stenosis. Clinically appears to be euvolemic.  But I suspect that his cardiac biomarkers will be elevated.  Prior to completion of the progress note patient is NT-proBNP levels came back elevated with a degree of renal insufficiency.  He would likely need to be on additional diuretics after his left heart catheterization will avoid initiation for now to prevent acute kidney injury. Hopeful that the LVEF will recover after aortic valve replacement. Given his young age may benefit from surgical aortic valve replacement versus TAVR.  The ultimate decision will be dependent on further evaluation and management patient is aware and agreeable with the plan of care Restart losartan  50 mg p.o. every morning. Will start Toprol -XL 25 mg p.o. every morning. Has not tolerated Invokana or Farxiga  in the past  Essential hypertension Office blood pressures are acceptable. Will restart losartan  50 mg p.o. every morning. Start Toprol -XL 25 mg p.o. every morning. Will uptitrate GDMT  Type 2 diabetes mellitus with hyperglycemia, with long-term current use of insulin  (HCC) Poorly controlled diabetic Do not have the most recent  A1c-per patient 5 months ago A1c was approximately 10.8 Currently on insulin  therapy. Currently on metformin  500 mg p.o. twice daily Has not tolerated Invokana or Farxiga  in the past as discussed above Continue Lipitor 40 mg p.o. 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  Cardiac MRI February 2024: 39 mm ascending aorta Echocardiogram February 2025: Ascending aorta 36 mm Reemphasized importance of blood pressure management. Restarted on losartan  and started Toprol -XL Likely will need longitudinal follow-up but should be reevaluated prior to aortic valve replacement, for completeness.  Orders Placed:  Orders Placed This Encounter  Procedures  . Basic metabolic panel with GFR    Standing Status:   Future    Number of Occurrences:   1    Expected Date:   07/27/2023    Expiration Date:   07/26/2024  . Pro b natriuretic peptide (BNP)    Standing Status:   Future    Number of Occurrences:   1    Expected Date:   07/27/2023    Expiration Date:   07/26/2024  . EKG 12-Lead   Final Medication List:    Meds ordered this encounter  Medications  . metoprolol  succinate (TOPROL  XL) 25 MG 24 hr tablet    Sig: Take 1 tablet (25 mg total) by mouth daily.    Dispense:  30 tablet    Refill:  3    Medications Discontinued During This Encounter  Medication Reason  . meloxicam (MOBIC) 15 MG tablet Patient Preference  Current Outpatient Medications:  .  atorvastatin  (LIPITOR) 40 MG tablet, Take 1 tablet (40 mg total) by mouth at bedtime., Disp: 90 tablet, Rfl: 3 .  hydrochlorothiazide  (HYDRODIURIL ) 25 MG tablet, Take 1 tablet (25 mg total) by mouth every morning., Disp: 90 tablet, Rfl: 3 .  insulin  lispro (HUMALOG ) 100 UNIT/ML KwikPen, Inject 16 Units into the skin 2 (two) times daily with a meal., Disp: , Rfl:  .  Insulin  NPH, Human,, Isophane, (HUMULIN  N KWIKPEN) 100 UNIT/ML Kiwkpen, Inject 20 Units into the skin  in the morning and at bedtime., Disp: , Rfl:  .  metFORMIN  (GLUCOPHAGE -XR) 500 MG 24 hr tablet, Take 1,000 mg by mouth 2 (two) times daily., Disp: , Rfl:  .  metoprolol  succinate (TOPROL  XL) 25 MG 24 hr tablet, Take 1 tablet (25 mg total) by mouth daily., Disp: 30 tablet, Rfl: 3 .  torsemide  (DEMADEX ) 20 MG tablet, Take 1 tablet (20 mg total) by mouth daily. (Patient not taking: Reported on 07/27/2023), Disp: , Rfl:   Consent:   See above  Disposition:   4 weeks, status post left and right heart catheterization to determine/coordinate workup for aortic valve replacement  His questions and concerns were addressed to his satisfaction. He voices understanding of the recommendations provided during this encounter.    Signed, Olinda Bertrand, DO, [facility]  [facility]  [address] [place], [place] [address]

## 2023-07-28 LAB — BASIC METABOLIC PANEL WITH GFR
BUN/Creatinine Ratio: 29 — ABNORMAL HIGH (ref 9–20)
BUN: 38 mg/dL — ABNORMAL HIGH (ref 6–24)
CO2: 27 mmol/L (ref 20–29)
Calcium: 9.9 mg/dL (ref 8.7–10.2)
Chloride: 95 mmol/L — ABNORMAL LOW (ref 96–106)
Creatinine, Ser: 1.3 mg/dL — ABNORMAL HIGH (ref 0.76–1.27)
Glucose: 226 mg/dL — ABNORMAL HIGH (ref 70–99)
Potassium: 4.3 mmol/L (ref 3.5–5.2)
Sodium: 137 mmol/L (ref 134–144)
eGFR: 64 mL/min/{1.73_m2} (ref >59)

## 2023-07-28 LAB — PRO B NATRIURETIC PEPTIDE: NT-Pro BNP: 1063 pg/mL — ABNORMAL HIGH (ref 0–210)

## 2023-07-29 ENCOUNTER — Encounter: Payer: Self-pay | Admitting: Cardiology

## 2023-08-10 ENCOUNTER — Telehealth: Payer: Self-pay | Admitting: Cardiology

## 2023-08-10 NOTE — Telephone Encounter (Signed)
 Received a [employer] FMLA form today.  I called patient who said that he would be in this week to sign the release and pay the $29 form fee.

## 2023-08-10 NOTE — Telephone Encounter (Signed)
 Attempted to call pt to discuss, LMTCB.

## 2023-08-10 NOTE — Telephone Encounter (Signed)
 Jonathon Bray,   Please follow up .   ST

## 2023-08-10 NOTE — Telephone Encounter (Signed)
 Spoke with pt and explained that since his PCP is the original physician that took him out of work, per pt, he will need to send the FMLA form to PCP office. Pt agreed with plan. Please fax to Alvia Awkward, MD office at [phone].

## 2023-08-11 NOTE — Telephone Encounter (Signed)
 FMLA for has been faxed to patient's PCP, Dr. Alvia Awkward per Dr. Albert Huff. Patient aware.

## 2023-09-08 ENCOUNTER — Telehealth: Payer: Self-pay

## 2023-09-08 NOTE — Telephone Encounter (Signed)
 Patient contacted the office with concerns about chest/ left arm pain and nausea and vomiting every time he eats. He states that he has had a significant weight loss over the last year as well. I advised that if he had consistent chest/ arm pain he needed to go to the nearest emergency room for evaluation. He did not want to contact Cardiology. Advised he should also be seen with his PCP about the N/V and weight loss. He acknowledged receipt. It was discussed that he is to follow-up with our office once he is seen by his Endocrinologist for surgical follow-up. He acknowledged receipt and is aware to contact our office back.

## 2023-09-09 ENCOUNTER — Encounter (HOSPITAL_COMMUNITY): Payer: Self-pay

## 2023-09-10 ENCOUNTER — Inpatient Hospital Stay (HOSPITAL_COMMUNITY)
Admission: EM | Admit: 2023-09-10 | Discharge: 2023-09-12 | DRG: 307 | Disposition: A | Discharge status: Home / Self Care | Source: Other Acute Inpatient Hospital | Attending: Internal Medicine | Admitting: Internal Medicine

## 2023-09-10 ENCOUNTER — Telehealth: Payer: Self-pay | Admitting: Cardiology

## 2023-09-10 ENCOUNTER — Inpatient Hospital Stay (HOSPITAL_COMMUNITY)

## 2023-09-10 DIAGNOSIS — I130 Hypertensive heart and chronic kidney disease with heart failure and stage 1 through stage 4 chronic kidney disease, or unspecified chronic kidney disease: Secondary | ICD-10-CM | POA: Diagnosis present

## 2023-09-10 DIAGNOSIS — I350 Nonrheumatic aortic (valve) stenosis: Principal | ICD-10-CM | POA: Diagnosis present

## 2023-09-10 DIAGNOSIS — I10 Essential (primary) hypertension: Secondary | ICD-10-CM | POA: Diagnosis present

## 2023-09-10 DIAGNOSIS — Z794 Long term (current) use of insulin: Secondary | ICD-10-CM

## 2023-09-10 DIAGNOSIS — Z7984 Long term (current) use of oral hypoglycemic drugs: Secondary | ICD-10-CM | POA: Diagnosis not present

## 2023-09-10 DIAGNOSIS — E1169 Type 2 diabetes mellitus with other specified complication: Secondary | ICD-10-CM | POA: Diagnosis present

## 2023-09-10 DIAGNOSIS — E785 Hyperlipidemia, unspecified: Secondary | ICD-10-CM | POA: Diagnosis present

## 2023-09-10 DIAGNOSIS — R7989 Other specified abnormal findings of blood chemistry: Secondary | ICD-10-CM

## 2023-09-10 DIAGNOSIS — I5022 Chronic systolic (congestive) heart failure: Secondary | ICD-10-CM | POA: Diagnosis present

## 2023-09-10 DIAGNOSIS — Z79899 Other long term (current) drug therapy: Secondary | ICD-10-CM

## 2023-09-10 DIAGNOSIS — E1122 Type 2 diabetes mellitus with diabetic chronic kidney disease: Secondary | ICD-10-CM | POA: Diagnosis present

## 2023-09-10 DIAGNOSIS — N189 Chronic kidney disease, unspecified: Secondary | ICD-10-CM | POA: Diagnosis present

## 2023-09-10 DIAGNOSIS — E119 Type 2 diabetes mellitus without complications: Secondary | ICD-10-CM

## 2023-09-10 DIAGNOSIS — I509 Heart failure, unspecified: Principal | ICD-10-CM

## 2023-09-10 LAB — COMPREHENSIVE METABOLIC PANEL WITH GFR
ALT: 14 U/L (ref 0–44)
AST: 17 U/L (ref 15–41)
Albumin: 3 g/dL — ABNORMAL LOW (ref 3.5–5.0)
Alkaline Phosphatase: 46 U/L (ref 38–126)
Anion gap: 11 (ref 5–15)
BUN: 30 mg/dL — ABNORMAL HIGH (ref 6–20)
CO2: 29 mmol/L (ref 22–32)
Calcium: 9.1 mg/dL (ref 8.9–10.3)
Chloride: 99 mmol/L (ref 98–111)
Creatinine, Ser: 1.25 mg/dL — ABNORMAL HIGH (ref 0.61–1.24)
GFR, Estimated: >60 mL/min (ref >60)
Glucose, Bld: 123 mg/dL — ABNORMAL HIGH (ref 70–99)
Potassium: 3.7 mmol/L (ref 3.5–5.1)
Sodium: 139 mmol/L (ref 135–145)
Total Bilirubin: 1 mg/dL (ref 0.0–1.2)
Total Protein: 6.8 g/dL (ref 6.5–8.1)

## 2023-09-10 LAB — CBC
HCT: 41.5 % (ref 39.0–52.0)
Hemoglobin: 15 g/dL (ref 13.0–17.0)
MCH: 29.1 pg (ref 26.0–34.0)
MCHC: 36.1 g/dL — ABNORMAL HIGH (ref 30.0–36.0)
MCV: 80.4 fL (ref 80.0–100.0)
Platelets: 289 10*3/uL (ref 150–400)
RBC: 5.16 MIL/uL (ref 4.22–5.81)
RDW: 12 % (ref 11.5–15.5)
WBC: 12.4 10*3/uL — ABNORMAL HIGH (ref 4.0–10.5)
nRBC: 0 % (ref 0.0–0.2)

## 2023-09-10 LAB — BRAIN NATRIURETIC PEPTIDE: B Natriuretic Peptide: 440.4 pg/mL — ABNORMAL HIGH (ref 0.0–100.0)

## 2023-09-10 LAB — HIV ANTIBODY (ROUTINE TESTING W REFLEX): HIV Screen 4th Generation wRfx: NONREACTIVE

## 2023-09-10 MED ORDER — SODIUM CHLORIDE 0.9% FLUSH: 3.0000 mL | INTRAVENOUS | Status: DC | PRN

## 2023-09-10 MED ORDER — ONDANSETRON HCL 4 MG/2ML IJ SOLN: 4.0000 mg | Freq: Four times a day (QID) | INTRAMUSCULAR | Status: DC | PRN

## 2023-09-10 MED ORDER — HEPARIN SODIUM (PORCINE) 5000 UNIT/ML IJ SOLN
5000.0000 [IU] | Freq: Three times a day (TID) | INTRAMUSCULAR | Status: DC
  Administered 2023-09-10 – 2023-09-12 (×5): 5000 [IU] via SUBCUTANEOUS
  Filled 2023-09-10 (×4): qty 1

## 2023-09-10 MED ORDER — SODIUM CHLORIDE 0.9% FLUSH
3.0000 mL | Freq: Two times a day (BID) | INTRAVENOUS | Status: DC
  Administered 2023-09-10 – 2023-09-12 (×4): 3 mL via INTRAVENOUS

## 2023-09-10 MED ORDER — ATORVASTATIN CALCIUM 40 MG PO TABS
40.0000 mg | ORAL_TABLET | Freq: Every day | ORAL | Status: DC
  Administered 2023-09-10 – 2023-09-11 (×2): 40 mg via ORAL
  Filled 2023-09-10 (×2): qty 1

## 2023-09-10 MED ORDER — INSULIN NPH (HUMAN) (ISOPHANE) 100 UNIT/ML ~~LOC~~ SUSP
10.0000 [IU] | Freq: Two times a day (BID) | SUBCUTANEOUS | Status: DC
  Filled 2023-09-10: qty 10

## 2023-09-10 MED ORDER — METOPROLOL SUCCINATE ER 25 MG PO TB24: 25.0000 mg | ORAL_TABLET | Freq: Every day | ORAL | Status: DC

## 2023-09-10 MED ORDER — SODIUM CHLORIDE 0.9 % IV SOLN: 250.0000 mL | INTRAVENOUS | Status: AC | PRN

## 2023-09-10 MED ORDER — ACETAMINOPHEN 325 MG PO TABS
650.0000 mg | ORAL_TABLET | ORAL | Status: DC | PRN
Start: 2023-09-10 — End: 2023-09-12
  Administered 2023-09-11: 650 mg via ORAL
  Filled 2023-09-10: qty 2

## 2023-09-10 NOTE — Telephone Encounter (Signed)
 Patient states daughter called the answering service this morning concerned that patient is still at [facility] and has not yet been transferred to [facility].  Reports that patient has been having issues with fluid in his lungs, proBNP elevated to 3000.  Reports that a doctor this morning told family and patient that EKG looked "bad".  I am able to review some labs and workup so far from [facility], high-sensitivity troponin 251> 243> 293> 305.  proBNP [id number].  This Dr. Katheryne Pane yesterday reportedly showed stable cardiomediastinal silhouette, increased interstitial markings.  No EKG available in epic for review.  I called bed control.  There are currently no cardiac beds available.  I relayed this to patient's step daughter.  Explained that as people are discharged this morning, beds will become available.  Patient's stepdaughter voiced understanding and appreciated call back  Debria Fang, PA-C 09/10/2023 9:06 AM

## 2023-09-10 NOTE — H&P (Signed)
 Cardiology Admission History and Physical:   Patient ID: Jonathon Bray MRN: [id number]; DOB: 09/05/1965   Admission date: 09/10/2023  Primary Care Provider: Orlena Bitters, MD Primary Cardiologist: Olinda Bertrand, DO  Primary Electrophysiologist:  None   Chief Complaint:  Shortness of Breath   Patient Profile:   Jonathon Bray is a 58 y.o. male with severe AS, HFrEF, HLD, DM   History of Present Illness:   Jonathon Bray presents to the ED with 2 to 3 days of shortness of breath and lower extremity swelling.  He states that he was not able to catch his breath, and laying flat or with exertion.  When at [facility] his creatinine was 1.15, with a troponin of 251 followed by 243. BNP was 300. While at [facility] he was diuresed with Lasix . Jonathon Bray presents to [facility] with improved symptoms. He is able to lay flat without any dyspnea. He is no longer having lower extremity edema.  He is inquiring about his valve replacement. He states that he is interested in a percutaneous option.  He met with the CT surgeons and was found to be a good candidate for AVR after a dental cleaning, however he would like to prioritize an option with a shorter recovery time.    Past Medical History:  Diagnosis Date   Aortic stenosis    Diabetes mellitus without complication (HCC)    type two - LIBRE SYSTEM ON RIGHT UPPER OUTER ARM   Hypertension    PONV (postoperative nausea and vomiting)     Past Surgical History:  Procedure Laterality Date   COLONOSCOPY  2020   CORONARY ANGIOGRAPHY N/A 05/10/2022   Procedure: CORONARY ANGIOGRAPHY;  Surgeon: Knox Perl, MD;  Location: [facility] INVASIVE CV LAB;  Service: Cardiovascular;  Laterality: N/A;   KYPHOPLASTY N/A 06/19/2020   Procedure: ATTEMPTED LUMBAR TWO KYPHOPLASTY;  Surgeon: Audie Bleacher, MD;  Location: [facility] OR;  Service: Neurosurgery;  Laterality: N/A;   RIGHT HEART CATH AND CORONARY ANGIOGRAPHY N/A 06/16/2023   Procedure: RIGHT HEART CATH AND CORONARY ANGIOGRAPHY;   Surgeon: Kyra Phy, MD;  Location: [facility] INVASIVE CV LAB;  Service: Cardiovascular;  Laterality: N/A;     Medications Prior to Admission: Prior to Admission medications   Medication Sig Start Date End Date Taking? Authorizing Provider  atorvastatin  (LIPITOR) 40 MG tablet Take 1 tablet (40 mg total) by mouth at bedtime. 06/02/23   Tolia, Sunit, DO  hydrochlorothiazide  (HYDRODIURIL ) 25 MG tablet Take 1 tablet (25 mg total) by mouth every morning. 06/06/23   Tolia, Sunit, DO  insulin  lispro (HUMALOG ) 100 UNIT/ML KwikPen Inject 16 Units into the skin 2 (two) times daily with a meal. 08/08/17   [provider]  Insulin  NPH, Human,, Isophane, (HUMULIN  N KWIKPEN) 100 UNIT/ML Kiwkpen Inject 20 Units into the skin in the morning and at bedtime. 08/08/17   [provider]  metFORMIN  (GLUCOPHAGE -XR) 500 MG 24 hr tablet Take 1,000 mg by mouth 2 (two) times daily. 04/25/22   [provider]  metoprolol  succinate (TOPROL  XL) 25 MG 24 hr tablet Take 1 tablet (25 mg total) by mouth daily. 07/27/23   Tolia, Sunit, DO  torsemide  (DEMADEX ) 20 MG tablet Take 1 tablet (20 mg total) by mouth daily. Patient not taking: Reported on 07/27/2023 06/30/23   Tolia, Sunit, DO     Allergies:    Allergies  Allergen Reactions   Invokana [Canagliflozin] Other (See Comments)    Dizziness Syncope    Levaquin [Levofloxacin] Rash  Zestril  [Lisinopril ] Cough    Social History:   Social History   Socioeconomic History   Marital status: Married    Spouse name: Not on file   Number of children: 3   Years of education: Not on file   Highest education level: Not on file  Occupational History   Not on file  Tobacco Use   Smoking status: Never   Smokeless tobacco: Never  Vaping Use   Vaping status: Never Used  Substance and Sexual Activity   Alcohol use: No   Drug use: No   Sexual activity: Yes  Other Topics Concern   Not on file  Social History Narrative   Not on file   Social Drivers of  Health   Financial Resource Strain: Not on file  Food Insecurity: No Food Insecurity (05/10/2022)   Hunger Vital Sign    Worried About Running Out of Food in the Last Year: Never true    Ran Out of Food in the Last Year: Never true  Transportation Needs: No Transportation Needs (05/10/2022)   PRAPARE - Administrator, Civil Service (Medical): No    Lack of Transportation (Non-Medical): No  Physical Activity: Not on file  Stress: Not on file  Social Connections: Unknown (08/17/2021)   Received from [facility]   Social Network    Social Network: Not on file  Intimate Partner Violence: Not At Risk (05/10/2022)   Humiliation, Afraid, Rape, and Kick questionnaire    Fear of Current or Ex-Partner: No    Emotionally Abused: No    Physically Abused: No    Sexually Abused: No    Family History:   The patient's family history includes Diabetes in his brother, father, and mother; Heart attack in his brother and sister; Heart disease in his father; Hypertension in his sister; Other (age of onset: 53) in his brother; Other (age of onset: 5) in his brother.    Review of Systems: [y] = yes, [ ]  = no    General: Weight gain [ ] ; Weight loss [ ] ; Anorexia [ ] ; Fatigue [ ] ; Fever [ ] ; Chills [ ] ; Weakness [ ]   Cardiac: Chest pain/pressure [ ] ; Resting SOB [ y]; Exertional SOB [ y]; Orthopnea [ y]; Pedal Edema [ y]; Palpitations [ ] ; Syncope [ ] ; Presyncope [ ] ; Paroxysmal nocturnal dyspnea[ ]   Pulmonary: Cough [ ] ; Wheezing[ ] ; Hemoptysis[ ] ; Sputum [ ] ; Snoring [ ]   GI: Vomiting[ ] ; Dysphagia[ ] ; Melena[ ] ; Hematochezia [ ] ; Heartburn[ ] ; Abdominal pain [ ] ; Constipation [ ] ; Diarrhea [ ] ; BRBPR [ ]   GU: Hematuria[ ] ; Dysuria [ ] ; Nocturia[ ]   Vascular: Pain in legs with walking [ ] ; Pain in feet with lying flat [ ] ; Non-healing sores [ ] ; Stroke [ ] ; TIA [ ] ; Slurred speech [ ] ;  Neuro: Headaches[ ] ; Vertigo[ ] ; Seizures[ ] ; Paresthesias[ ] ;Blurred vision [ ] ; Diplopia [ ] ;  Vision changes [ ]   Ortho/Skin: Arthritis [ ] ; Joint pain [ ] ; Muscle pain [ ] ; Joint swelling [ ] ; Back Pain [ ] ; Rash [ ]   Psych: Depression[ ] ; Anxiety[ ]   Heme: Bleeding problems [ ] ; Clotting disorders [ ] ; Anemia [ ]   Endocrine: Diabetes [ ] ; Thyroid dysfunction[ ]   Physical Exam/Data:   Vitals:   09/10/23 2018  BP: (!) 122/91  Pulse: (!) 116  Resp: 20  Temp: 97.9 F (36.6 C)  TempSrc: Oral  SpO2: 96%  Weight: 82.6 kg  Height: 5\' 5"  (  1.651 m)   No intake or output data in the 24 hours ending 09/10/23 2203 Filed Weights   09/10/23 2018  Weight: 82.6 kg   Body mass index is 30.29 kg/m.  General:  no acute distress, laying flat on the bed with no difficulty HEENT: normal Neck: JVP 10 cm  Cardiac:  normal S1, S2, increased heart rate. Normal rhythm.  Lungs:  clear to auscultation bilaterally Abd: soft non tender Ext: no edema Musculoskeletal:  No deformities, BUE and BLE strength normal and equal Skin: warm and dry  Psych:  Normal affect    EKG:   EKG at time of admission pending   Relevant CV Studies:  05/2023 ECHO  1. Left ventricular ejection fraction, by estimation, is 35 to 40%. The  left ventricle has moderately decreased function. The left ventricle has  no regional wall motion abnormalities. Left ventricular diastolic  parameters are consistent with Grade I  diastolic dysfunction (impaired relaxation). Elevated left ventricular  end-diastolic pressure.   2. Right ventricular systolic function is normal. The right ventricular  size is normal. There is normal pulmonary artery systolic pressure.   3. The mitral valve is normal in structure. No evidence of mitral valve  regurgitation. No evidence of mitral stenosis.   4. The aortic valve is calcified. There is severe calcifcation of the  aortic valve. There is severe thickening of the aortic valve. Aortic valve  regurgitation is trivial. Severe aortic valve stenosis. Aortic valve area,  by VTI measures  0.74 cm. Aortic  valve mean gradient measures 47.0 mmHg. Aortic valve Vmax measures 3.90  m/s.   5. The inferior vena cava is normal in size with greater than 50%  respiratory variability, suggesting right atrial pressure of 3 mmHg.   Laboratory Data:  ChemistryNo results for input(s): "NA", "K", "CL", "CO2", "GLUCOSE", "BUN", "CREATININE", "CALCIUM ", "GFRNONAA", "GFRAA", "ANIONGAP" in the last 168 hours.  No results for input(s): "PROT", "ALBUMIN", "AST", "ALT", "ALKPHOS", "BILITOT" in the last 168 hours. HematologyNo results for input(s): "WBC", "RBC", "HGB", "HCT", "MCV", "MCH", "MCHC", "RDW", "PLT" in the last 168 hours. Cardiac EnzymesNo results for input(s): "TROPONINI" in the last 168 hours. No results for input(s): "TROPIPOC" in the last 168 hours.  BNPNo results for input(s): "BNP", "PROBNP" in the last 168 hours.  DDimer No results for input(s): "DDIMER" in the last 168 hours.  Radiology/Studies:  DG CHEST PORT 1 VIEW Result Date: 09/10/2023 CLINICAL DATA:  Heart failure EXAM: PORTABLE CHEST 1 VIEW COMPARISON:  05/08/2023 FINDINGS: Cardiac shadow is prominent but stable. The lungs are well aerated bilaterally. No focal infiltrate or effusion is seen. No vascular congestion is noted. No bony abnormality is seen. IMPRESSION: No acute abnormality noted. Electronically Signed   By: Violeta Grey M.D.   On: 09/10/2023 21:44    Assessment and Plan:  Jonathon Bray presents to the ED for shortness of breath found to be in heart failure in the setting of severe AS.   Severe AS (EF 35%; AVA 0.74 cm2; mean gradient 47 mmHg; peak velocity 3.90 m/s)  Jonathon Bray presents to [facility] after OSH transfer with the initial symptom of shortness of breath with heart failure symptoms. Heart failure in the setting of AS, it is likely related to his valve disease. He was well diuresed at [facility], and JVP is 10 cm today. Initial plan was for AVR later this month after dental cleaning. He obtained the dental  cleaning, however, JonathonBray would like to discuss alternative valve replacement options, such  as a percutaneous valve. It is important to him to have to have a shorter recovery duration. -Lasix  60 mg IV tomorrow AM  -Will discuss with structural team   Systolic Heart Failure EF 35% - Lasix  as above  - Continue metoprolol  succinate    Dyslipidemia  - Continue atorvastatin     Severity of Illness: The appropriate patient status for this patient is INPATIENT. Inpatient status is judged to be reasonable and necessary in order to provide the required intensity of service to ensure the patient's safety. The patient's presenting symptoms, physical exam findings, and initial radiographic and laboratory data in the context of their chronic comorbidities is felt to place them at high risk for further clinical deterioration. Furthermore, it is not anticipated that the patient will be medically stable for discharge from the hospital within 2 midnights of admission.   * I certify that at the point of admission it is my clinical judgment that the patient will require inpatient hospital care spanning beyond 2 midnights from the point of admission due to high intensity of service, high risk for further deterioration and high frequency of surveillance required.*   For questions or updates, please contact [facility] Please consult [email or web address] for contact info under        Signed, Renelda Carry, MD  09/10/2023 10:03 PM

## 2023-09-11 ENCOUNTER — Inpatient Hospital Stay (HOSPITAL_COMMUNITY)

## 2023-09-11 ENCOUNTER — Other Ambulatory Visit: Payer: Self-pay

## 2023-09-11 ENCOUNTER — Encounter (HOSPITAL_COMMUNITY): Payer: Self-pay | Admitting: Student in an Organized Health Care Education/Training Program

## 2023-09-11 DIAGNOSIS — I5022 Chronic systolic (congestive) heart failure: Secondary | ICD-10-CM | POA: Diagnosis not present

## 2023-09-11 DIAGNOSIS — I350 Nonrheumatic aortic (valve) stenosis: Principal | ICD-10-CM

## 2023-09-11 LAB — ECHOCARDIOGRAM COMPLETE
AR max vel: 0.44 cm2
AV Area VTI: 0.4 cm2
AV Area mean vel: 0.37 cm2
AV Mean grad: 31 mmHg
AV Peak grad: 40.7 mmHg
Ao pk vel: 3.19 m/s
Area-P 1/2: 7.66 cm2
Height: 65 in
Weight: 2927.71 [oz_av]

## 2023-09-11 LAB — BASIC METABOLIC PANEL WITH GFR
Anion gap: 10 (ref 5–15)
BUN: 27 mg/dL — ABNORMAL HIGH (ref 6–20)
CO2: 28 mmol/L (ref 22–32)
Calcium: 8.4 mg/dL — ABNORMAL LOW (ref 8.9–10.3)
Chloride: 98 mmol/L (ref 98–111)
Creatinine, Ser: 1.2 mg/dL (ref 0.61–1.24)
GFR, Estimated: >60 mL/min (ref >60)
Glucose, Bld: 170 mg/dL — ABNORMAL HIGH (ref 70–99)
Potassium: 3.5 mmol/L (ref 3.5–5.1)
Sodium: 136 mmol/L (ref 135–145)

## 2023-09-11 LAB — GLUCOSE, CAPILLARY
Glucose-Capillary: 235 mg/dL — ABNORMAL HIGH (ref 70–99)
Glucose-Capillary: 197 mg/dL — ABNORMAL HIGH (ref 70–99)
Glucose-Capillary: 308 mg/dL — ABNORMAL HIGH (ref 70–99)

## 2023-09-11 MED ORDER — METOPROLOL SUCCINATE ER 25 MG PO TB24
12.5000 mg | ORAL_TABLET | Freq: Every day | ORAL | Status: DC
  Administered 2023-09-12: 12.5 mg via ORAL
  Filled 2023-09-11: qty 1

## 2023-09-11 MED ORDER — POTASSIUM CHLORIDE CRYS ER 20 MEQ PO TBCR
40.0000 meq | EXTENDED_RELEASE_TABLET | Freq: Once | ORAL | Status: AC
  Administered 2023-09-11: 40 meq via ORAL
  Filled 2023-09-11: qty 2

## 2023-09-11 MED ORDER — INSULIN ASPART 100 UNIT/ML IJ SOLN
0.0000 [IU] | Freq: Three times a day (TID) | INTRAMUSCULAR | Status: DC
  Administered 2023-09-11: 5 [IU] via SUBCUTANEOUS
  Administered 2023-09-11 – 2023-09-12 (×2): 3 [IU] via SUBCUTANEOUS

## 2023-09-11 NOTE — Progress Notes (Signed)
   Rounding Note    Patient Name: Jonathon Bray Date of Encounter: 09/11/2023  [facility] Cardiologist: Olinda Bertrand, DO   Subjective   No acute events overnight.  Family at bedside.  Tells me that his breathing is better.  Vital Signs    Vitals:   09/10/23 2018 09/11/23 0000 09/11/23 0500 09/11/23 0759  BP: (!) 122/91 109/78 113/88 121/89  Pulse: (!) 116 (!) 108 99 (!) 117  Resp: 20 18 18 20   Temp: 97.9 F (36.6 C) 98 F (36.7 C) 97.9 F (36.6 C) 98.3 F (36.8 C)  TempSrc: Oral Oral Oral Oral  SpO2: 96% 91% 95% 96%  Weight: 82.6 kg  83 kg   Height: 5\' 5"  (1.651 m)       Intake/Output Summary (Last 24 hours) at 09/11/2023 0837 Last data filed at 09/11/2023 0600 Gross per 24 hour  Intake --  Output 800 ml  Net -800 ml      09/11/2023    5:00 AM 09/10/2023    8:18 PM 07/27/2023   12:08 PM  Last 3 Weights  Weight (lbs) 182 lb 15.7 oz 182 lb 201 lb  Weight (kg) 83 kg 82.555 kg 91.173 kg      Telemetry    Sinus tachycardia with ventricular rates in the 110s to 120s.  Rare PVCs.\- Personally Reviewed  ECG    Personally Reviewed  Physical Exam   GEN: No acute distress.   Cardiac: Regular rhythm, tachycardic, 3 out of 6 crescendo decrescendo systolic ejection murmur loudest at the upper sternal borders.  Warm extremities Respiratory: Clear to auscultation bilaterally. Psych: Normal affect   Assessment & Plan    #Severe aortic stenosis The patient has severe aortic stenosis and reduced left ventricular function.  He is currently hemodynamically stable although I think his status is tenuous.  He will require aortic valve intervention.  Low threshold to transfer to ICU should he become hypotensive, experience chest discomfort or develop lightheadedness/dizziness.  #Chronic systolic heart failure Appears euvolemic this morning.  I suspect he has marginal output given the severity of his LV, severity of aortic stenosis and tachycardia.     Donelda Fujita T.  Marven Slimmer, MD, [facility], [place] Cardiac Electrophysiology

## 2023-09-11 NOTE — Plan of Care (Signed)

## 2023-09-12 ENCOUNTER — Telehealth: Payer: Self-pay

## 2023-09-12 LAB — BASIC METABOLIC PANEL WITH GFR
Anion gap: 10 (ref 5–15)
BUN: 30 mg/dL — ABNORMAL HIGH (ref 6–20)
CO2: 26 mmol/L (ref 22–32)
Calcium: 8.9 mg/dL (ref 8.9–10.3)
Chloride: 100 mmol/L (ref 98–111)
Creatinine, Ser: 1.23 mg/dL (ref 0.61–1.24)
GFR, Estimated: >60 mL/min (ref >60)
Glucose, Bld: 191 mg/dL — ABNORMAL HIGH (ref 70–99)
Potassium: 4.1 mmol/L (ref 3.5–5.1)
Sodium: 136 mmol/L (ref 135–145)

## 2023-09-12 LAB — GLUCOSE, CAPILLARY
Glucose-Capillary: 196 mg/dL — ABNORMAL HIGH (ref 70–99)
Glucose-Capillary: 204 mg/dL — ABNORMAL HIGH (ref 70–99)

## 2023-09-12 LAB — HEMOGLOBIN A1C
Hgb A1c MFr Bld: 9 % — ABNORMAL HIGH (ref 4.8–5.6)
Mean Plasma Glucose: 212 mg/dL

## 2023-09-12 MED ORDER — TORSEMIDE 20 MG PO TABS: 20.0000 mg | ORAL_TABLET | Freq: Two times a day (BID) | ORAL | 1 refills | Status: DC

## 2023-09-12 NOTE — Discharge Summary (Addendum)
 Discharge Summary   Patient ID: Jonathon Bray MRN: [id number]; DOB: 09-Nov-1965  Admit date: 09/10/2023 Discharge date: 09/12/2023  PCP:  Orlena Bitters, MD   [facility] Providers Cardiologist:  Olinda Bertrand, DO       Discharge Diagnoses  Principal Problem:   Nonrheumatic aortic valve stenosis Active Problems:   Diabetes mellitus (HCC)   Primary hypertension   Hyperlipidemia associated with type 2 diabetes mellitus (HCC)   Heart failure (HCC)   Diagnostic Studies/Procedures  Echo 09/11/23 IMPRESSIONS    1. Left ventricular ejection fraction, by estimation, is 20 to 25%. The  left ventricle has severely decreased function. The left ventricle  demonstrates global hypokinesis, septal motion consistent with LBBB and  more preserved contraction in basal  posterior wall. There is mild concentric left ventricular hypertrophy.  Left ventricular diastolic parameters are indeterminate.   2. Right ventricular systolic function is normal. The right ventricular  size is normal. Tricuspid regurgitation signal is inadequate for assessing  PA pressure.   3. The mitral valve is grossly normal. Trivial mitral valve  regurgitation.   4. The aortic valve has an indeterminant number of cusps. There is severe  calcifcation of the aortic valve. Aortic valve regurgitation is mild.  Severe aortic valve stenosis, low flow/low gradient. Aortic valve mean  gradient measures 31.0 mmHg.  Dimentionless index 0.16.   5. Unable to estimate CVP.   Comparison(s): Prior images reviewed side by side. LVEF has decreased  further, 20-25% range. Severe aortic stenosis.   CXR 09/10/23 FINDINGS: Cardiac shadow is prominent but stable. The lungs are well aerated bilaterally. No focal infiltrate or effusion is seen. No vascular congestion is noted. No bony abnormality is seen. _____________   History of Present Illness   Jonathon Bray is a 58 y.o. male with pmhx of severe AS, HFrEF, HLD, and T2DM  who presented to the ED at [facility] for progressive worsening dyspnea on exertion with orthopnea and peripheral edema. Troponin (305->243->293). Pro-BNP 3674. He was given IV lasix . He was transferred to [facility] on 09/10/23.   Hospital Course   Consultants: None   Severe Aortic Stenosis-LFLG Presented to outside hospital with volume overload, echo 09/11/23 showed progression of his aortic valve stenosis and worsening ejection fraction (20-25% from 35-40%).On exam 09/12/23, he appeared overall improved. Patient reported significant improved in symptoms.    He is followed by Dr. Albert Huff and Dr. Luna Salinas outpatient where workup for aortic valve replacement has been started including RHC in 06/2023. He was instructed to obtain dental cleaning, and return to CT surgery to schedule his aortic valve replacement; He has now had his dental cleaning. Patient was interested in speaking with team about other options such as a percutaneous valve due to wanting to have a shorter recovery duration. Dr. Lorie Rook had an extensive conversation with patient about repair vs replacement. He recommended the surgical aortic valve replacement due to patient's age and lack of comorbidities. Patient in agreement. Spoke with CT surgery for follow-up appointment to schedule surgery. Will occur after endocrinology appointment that is scheduled for 6/12 per patient.   HFrEF Worsening HFrEF suspected due to his aortic valve stenosis. See above.  Previous echo on 06/02/23 showed EF ( 35-40%) without regional wall motion abnormalities.  Echo yesterday on 09/11/23 showed EF ( 20-25%) with septal motion consistent with new LBBB. BNP 440 at [facility]. Flat troponin's.  Patient was volume overload at presentation to [facility] and given IV lasix . His home torsemide  was  held at [facility].On discharge, minimal trace peripheral edema. He reported feeling significantly improved.Has not tolerated Farxiga  in the past due to  dizziness. Did not restart PTA losartan  due to blood pressure during admission.  Discharged on torsemide  20mg  BID.  Discharged on metoprolol  succinate 25 mg daily.   Dyslipidemia Discharged on Lipitor 40 mg   Hypertension With patient's worsening aortic stenosis would be cautious about adding back hypertensive medications. Will not restart PTA hydrochlorothiazide  and losartan  at discharge -continued on metoprolol  succinate 25 mg and torsemide  20mg  BID  5. T2DM  Patient discharged on PTA ozempic and insulin . Has follow up scheduled with endocrinologist 6/12.    Did the patient have an acute coronary syndrome (MI, NSTEMI, STEMI, etc) this admission?:  No                               Did the patient have a percutaneous coronary intervention (stent / angioplasty)?:  No.        The patient will be scheduled for a TOC follow up appointment in 14 days.  A message has been sent to the [facility] and Scheduling Pool at the office where the patient should be seen for follow up.  _____________  Discharge Vitals Blood pressure (!) 120/95, pulse (!) 108, temperature 98.3 F (36.8 C), temperature source Oral, resp. rate 18, height 5\' 5"  (1.651 m), weight 86.4 kg, SpO2 98%.  Filed Weights   09/10/23 2018 09/11/23 0500 09/12/23 0536  Weight: 82.6 kg 83 kg 86.4 kg    Labs & Radiologic Studies  CBC Recent Labs    09/10/23 2143  WBC 12.4*  HGB 15.0  HCT 41.5  MCV 80.4  PLT 289   Basic Metabolic Panel Recent Labs    09/81/19 0403 09/12/23 0454  NA 136 136  K 3.5 4.1  CL 98 100  CO2 28 26  GLUCOSE 170* 191*  BUN 27* 30*  CREATININE 1.20 1.23  CALCIUM  8.4* 8.9   Liver Function Tests Recent Labs    09/10/23 2143  AST 17  ALT 14  ALKPHOS 46  BILITOT 1.0  PROT 6.8  ALBUMIN 3.0*   No results for input(s): "LIPASE", "AMYLASE" in the last 72 hours. High Sensitivity Troponin:   No results for input(s): "TROPONINIHS" in the last 720 hours.  No results for input(s): "TRNPT" in  the last 720 hours.  BNP Invalid input(s): "POCBNP" No results for input(s): "PROBNP" in the last 72 hours.  Recent Labs    09/10/23 2142  BNP 440.4*    D-Dimer No results for input(s): "DDIMER" in the last 72 hours. Hemoglobin A1C Recent Labs    09/11/23 1328  HGBA1C 9.0*   Fasting Lipid Panel No results for input(s): "CHOL", "HDL", "LDLCALC", "TRIG", "CHOLHDL", "LDLDIRECT" in the last 72 hours. Lipoprotein (a)  Date/Time Value Ref Range Status  05/10/2022 01:56 PM 161.1 (H) <75.0 nmol/L Final    Comment:    (NOTE) Note:  Values greater than or equal to 75.0 nmol/L may       indicate an independent risk factor for CHD,       but must be evaluated with caution when applied       to non-Caucasian populations due to the       influence of genetic factors on Lp(a) across       ethnicities. Performed At: [facility] [address] [place], [place] [id number] Pearlean Botts MD ZH:[id number]  Thyroid Function Tests No results for input(s): "TSH", "T4TOTAL", "T3FREE", "THYROIDAB" in the last 72 hours.  Invalid input(s): "FREET3" _____________  ECHOCARDIOGRAM COMPLETE Result Date: 09/11/2023    ECHOCARDIOGRAM REPORT   Patient Name:   Jonathon Bray Date of Exam: 09/11/2023 Medical Rec #:  [id number]         Height:       65.0 in Accession #:    [id number]        Weight:       183.0 lb Date of Birth:  January 12, 1966         BSA:          1.905 m Patient Age:    57 years          BP:           124/98 mmHg Patient Gender: M                 HR:           109 bpm. Exam Location:  Inpatient Procedure: 2D Echo, Cardiac Doppler and Color Doppler (Both Spectral and Color            Flow Doppler were utilized during procedure). Indications:    CHF  History:        Patient has prior history of Echocardiogram examinations, most                 recent 06/02/2023. Previous Myocardial Infarction; Risk                 Factors:Diabetes, Dyslipidemia and Non-Smoker.  Sonographer:    Gelene Kelly RDCS Referring Phys: [id number] NKIRU C OSUDE IMPRESSIONS  1. Left ventricular ejection fraction, by estimation, is 20 to 25%. The left ventricle has severely decreased function. The left ventricle demonstrates global hypokinesis, septal motion consistent with LBBB and more preserved contraction in basal posterior wall. There is mild concentric left ventricular hypertrophy. Left ventricular diastolic parameters are indeterminate.  2. Right ventricular systolic function is normal. The right ventricular size is normal. Tricuspid regurgitation signal is inadequate for assessing PA pressure.  3. The mitral valve is grossly normal. Trivial mitral valve regurgitation.  4. The aortic valve has an indeterminant number of cusps. There is severe calcifcation of the aortic valve. Aortic valve regurgitation is mild. Severe aortic valve stenosis, low flow/low gradient. Aortic valve mean gradient measures 31.0 mmHg. Dimentionless index 0.16.  5. Unable to estimate CVP. Comparison(s): Prior images reviewed side by side. LVEF has decreased further, 20-25% range. Severe aortic stenosis. FINDINGS  Left Ventricle: Left ventricular ejection fraction, by estimation, is 20 to 25%. The left ventricle has severely decreased function. The left ventricle demonstrates global hypokinesis. The left ventricular internal cavity size was normal in size. There is mild concentric left ventricular hypertrophy. Abnormal (paradoxical) septal motion, consistent with left bundle branch block. Left ventricular diastolic parameters are indeterminate. Right Ventricle: The right ventricular size is normal. No increase in right ventricular wall thickness. Right ventricular systolic function is normal. Tricuspid regurgitation signal is inadequate for assessing PA pressure. Left Atrium: Left atrial size was normal in size. Right Atrium: Right atrial size was normal in size. Pericardium: There is no evidence of pericardial effusion. Mitral Valve: The mitral  valve is grossly normal. Trivial mitral valve regurgitation. Tricuspid Valve: The tricuspid valve is grossly normal. Tricuspid valve regurgitation is trivial. Aortic Valve: The aortic valve has an indeterminant number of cusps. There is severe calcifcation of the aortic valve. There is  mild to moderate aortic valve annular calcification. Aortic valve regurgitation is mild. Severe aortic stenosis is present. Aortic valve mean gradient measures 31.0 mmHg. Aortic valve peak gradient measures 40.7 mmHg. Aortic valve area, by VTI measures 0.40 cm. Pulmonic Valve: The pulmonic valve was grossly normal. Pulmonic valve regurgitation is trivial. Aorta: The aortic root is normal in size and structure. Venous: Unable to estimate CVP. The inferior vena cava was not well visualized. IAS/Shunts: The interatrial septum was not well visualized. Additional Comments: 3D was performed not requiring image post processing on an independent workstation and was indeterminate.  LEFT VENTRICLE PLAX 2D LVIDd:         4.70 cm   Diastology LV PW:         1.50 cm   LV e' medial:    5.00 cm/s LV IVS:        1.10 cm   LV E/e' medial:  31.2 LVOT diam:     1.80 cm   LV e' lateral:   11.60 cm/s LV SV:         29        LV E/e' lateral: 13.4 LV SV Index:   15 LVOT Area:     2.54 cm  RIGHT VENTRICLE RV S prime:     9.70 cm/s TAPSE (M-mode): 2.0 cm LEFT ATRIUM             Index LA diam:        3.70 cm 1.94 cm/m LA Vol (A2C):   45.9 ml 24.10 ml/m LA Vol (A4C):   37.9 ml 19.90 ml/m LA Biplane Vol: 46.1 ml 24.20 ml/m  AORTIC VALVE AV Area (Vmax):    0.44 cm AV Area (Vmean):   0.37 cm AV Area (VTI):     0.40 cm AV Vmax:           319.00 cm/s AV Vmean:          276.000 cm/s AV VTI:            0.717 m AV Peak Grad:      40.7 mmHg AV Mean Grad:      31.0 mmHg LVOT Vmax:         55.30 cm/s LVOT Vmean:        39.900 cm/s LVOT VTI:          0.112 m LVOT/AV VTI ratio: 0.16  AORTA Ao Root diam: 2.70 cm Ao Asc diam:  3.60 cm MITRAL VALVE MV Area (PHT):  7.66 cm     SHUNTS MV Decel Time: 99 msec      Systemic VTI:  0.11 m MV E velocity: 156.00 cm/s  Systemic Diam: 1.80 cm MV A velocity: 51.90 cm/s MV E/A ratio:  3.01 Teddie Favre MD Electronically signed by Teddie Favre MD Signature Date/Time: 09/11/2023/3:36:46 PM    Final    DG CHEST PORT 1 VIEW Result Date: 09/10/2023 CLINICAL DATA:  Heart failure EXAM: PORTABLE CHEST 1 VIEW COMPARISON:  05/08/2023 FINDINGS: Cardiac shadow is prominent but stable. The lungs are well aerated bilaterally. No focal infiltrate or effusion is seen. No vascular congestion is noted. No bony abnormality is seen. IMPRESSION: No acute abnormality noted. Electronically Signed   By: Violeta Grey M.D.   On: 09/10/2023 21:44    Disposition Pt is being discharged home today in good condition.  Follow-up Plans & Appointments  Discharge Instructions     Diet - low sodium heart healthy   Complete by: As directed  Increase activity slowly   Complete by: As directed       CT surgery appointment to schedule AVR to occur after patient sees endocrinology on 6/12.   Has routine 3 month cardiology follow-up on 7/24 with Dr. Albert Huff   2 week follow up with general cardiology   Discharge Medications Allergies as of 09/12/2023       Reactions   Invokana [canagliflozin] Other (See Comments)   Dizziness Syncope    Levaquin [levofloxacin] Rash   Zestril  [lisinopril ] Cough        Medication List     STOP taking these medications    hydrochlorothiazide  12.5 MG capsule Commonly known as: MICROZIDE    hydrochlorothiazide  25 MG tablet Commonly known as: HYDRODIURIL    losartan  50 MG tablet Commonly known as: COZAAR    metFORMIN  500 MG 24 hr tablet Commonly known as: GLUCOPHAGE -XR   ondansetron  4 MG tablet Commonly known as: ZOFRAN        TAKE these medications    atorvastatin  40 MG tablet Commonly known as: LIPITOR Take 1 tablet (40 mg total) by mouth at bedtime.   HumuLIN  N KwikPen 100 UNIT/ML  KwikPen Generic drug: Insulin  NPH (Human) (Isophane) Inject 20 Units into the skin in the morning and at bedtime.   insulin  lispro 100 UNIT/ML KwikPen Commonly known as: HUMALOG  Inject 16 Units into the skin 3 (three) times daily.   metoprolol  succinate 25 MG 24 hr tablet Commonly known as: Toprol  XL Take 1 tablet (25 mg total) by mouth daily.   Ozempic (0.25 or 0.5 MG/DOSE) 2 MG/3ML Sopn Generic drug: Semaglutide(0.25 or 0.5MG /DOS) Inject 0.25 mg into the skin once a week.   torsemide  20 MG tablet Commonly known as: DEMADEX  Take 1 tablet (20 mg total) by mouth 2 (two) times daily. What changed: when to take this   Tresiba FlexTouch 200 UNIT/ML FlexTouch Pen Generic drug: insulin  degludec Inject 54 Units into the skin daily.         Outstanding Labs/Studies BMET at follow up  Duration of Discharge Encounter: APP Time: 25 minutes   Signed, [name], PA-C 09/12/2023, 12:02 PM   ATTENDING ATTESTATION:  After conducting a review of all available clinical information with the care team, interviewing the patient, and performing a physical exam, I agree with the findings and plan described in this note.   GEN: No acute distress.   HEENT:  MMM, no JVD, no scleral icterus Cardiac: RRR, 2/6 SEM Respiratory: Clear to auscultation bilaterally. GI: Soft, nontender, non-distended  MS: No edema; No deformity. Neuro:  Nonfocal  Vasc:  +2 radial pulses  Patient doing well after medical therapy for acute on chronic systolic heart failure.  Will increase torsemide  to 20mg  BID.  We have contacted CTS office who will arrange outpatient follow up.  D/C today with plan as above.  APP discharge time:25 MD discharge time:30  Alyssa Backbone, MD Pager [phone]

## 2023-09-12 NOTE — TOC CM/SW Note (Signed)
 Transition of Care [facility]) - Inpatient Brief Assessment   Patient Details  Name: Jonathon Bray MRN: [id number] Date of Birth: [birth year]  [facility] of [facility]) CM/SW Contact:    Cosimo Diones, RN Phone Number: 09/12/2023, 12:46 PM   Clinical Narrative: Patient presented for hx severe aortic stenosis. PTA patient was from home with spouse. Case Manager spoke with spouse and no home needs identified. Spouse will transport patient home via private vehicle.      Transition of Care Asessment: Insurance and Status: Insurance coverage has been reviewed Patient has primary care physician: Yes Home environment has been reviewed: reviewed Prior level of function:: independent Prior/Current Home Services: No current home services Social Drivers of Health Review: SDOH reviewed no interventions necessary Readmission risk has been reviewed: Yes Transition of care needs: no transition of care needs at this time

## 2023-09-12 NOTE — Progress Notes (Signed)
 Heart Failure Navigator Progress Note  Assessed for Heart & Vascular TOC clinic readiness.  Patient does not meet criteria due to TAVR evaluation, No HF TOC. .   Navigator will sign off at this time.   Randie Bustle, BSN, Scientist, clinical (histocompatibility and immunogenetics) Only

## 2023-09-12 NOTE — Telephone Encounter (Signed)
 Patient identification verified by 2 forms. Sims Duck, RN     Called patient. No answer. LVMTCB

## 2023-09-12 NOTE — Telephone Encounter (Signed)
   Transition of Care Follow-up Phone Call Request    Patient Name: Jonathon Bray Date of Birth: [birth year] Date of Encounter: 09/12/2023  Primary Care Provider:  Orlena Bitters, MD Primary Cardiologist:  Olinda Bertrand, DO  [name] has been scheduled for a transition of care follow up appointment with a [facility] provider:  Ervin Heath 6/20  Please reach out to [facility] within 48 hours of discharge to confirm appointment and review transition of care protocol questionnaire. Anticipated discharge date: 09/05/23  [name], PA-C  09/12/2023, 12:03 PM

## 2023-09-12 NOTE — Progress Notes (Signed)
 Rounding Note   Patient Name: Jonathon Bray Date of Encounter: 09/12/2023  [facility] Cardiologist: Olinda Bertrand, DO   Subjective Patient reports improved breathing and peripheral edema. He feels much better since he initially presented to the ED. Denies chest pain,palpitations, lightheadedness, and dizziness. His orthopnea has improved, denies PND. Reports rest pain to feet, been told in the past it is due to circulation not neuropathy.  Would still like to speak to someone about repair over replacement for his valve.   Scheduled Meds:  atorvastatin   40 mg Oral QHS   heparin   5,000 Units Subcutaneous Q8H   insulin  aspart  0-15 Units Subcutaneous TID WC   metoprolol  succinate  12.5 mg Oral Daily   sodium chloride  flush  3 mL Intravenous Q12H   Continuous Infusions:  PRN Meds: acetaminophen , ondansetron  (ZOFRAN ) IV, sodium chloride  flush   Vital Signs  Vitals:   09/11/23 1617 09/11/23 2039 09/12/23 0022 09/12/23 0536  BP: 119/89 (!) 116/94 110/85 (!) 112/95  Pulse:  (!) 112 (!) 112 (!) 105  Resp: 17 18 18 18   Temp: 97.7 F (36.5 C) 97.8 F (36.6 C) 98.2 F (36.8 C) 97.6 F (36.4 C)  TempSrc: Oral Oral Oral Oral  SpO2: 95% 96% 98% 99%  Weight:    86.4 kg  Height:        Intake/Output Summary (Last 24 hours) at 09/12/2023 0755 Last data filed at 09/12/2023 0500 Gross per 24 hour  Intake --  Output 800 ml  Net -800 ml      09/12/2023    5:36 AM 09/11/2023    5:00 AM 09/10/2023    8:18 PM  Last 3 Weights  Weight (lbs) 190 lb 8 oz 182 lb 15.7 oz 182 lb  Weight (kg) 86.41 kg 83 kg 82.555 kg      Telemetry Sinus rhythm and LBBB; HR 105, with occasional PVC - Personally Reviewed  Physical Exam GEN: No acute distress.   Neck: No JVD Cardiac: regular rhythm, tachycardic, systolic crescendo murmur to LUSB III/VI.  Respiratory: Clear to auscultation bilaterally. GI: Soft, nontender, non-distended  MS: Bilateral trace peripheral edema with overlying  hyperpigmentation. Tortuous varicosities to left lower leg.    Neuro:  Nonfocal  Psych: Normal affect   Labs High Sensitivity Troponin:  No results for input(s): "TROPONINIHS" in the last 720 hours.   Chemistry Recent Labs  Lab 09/10/23 2143 09/11/23 0403 09/12/23 0454  NA 139 136 136  K 3.7 3.5 4.1  CL 99 98 100  CO2 29 28 26   GLUCOSE 123* 170* 191*  BUN 30* 27* 30*  CREATININE 1.25* 1.20 1.23  CALCIUM  9.1 8.4* 8.9  PROT 6.8  --   --   ALBUMIN 3.0*  --   --   AST 17  --   --   ALT 14  --   --   ALKPHOS 46  --   --   BILITOT 1.0  --   --   GFRNONAA >60 >60 >60  ANIONGAP 11 10 10     Lipids No results for input(s): "CHOL", "TRIG", "HDL", "LABVLDL", "LDLCALC", "CHOLHDL" in the last 168 hours.  Hematology Recent Labs  Lab 09/10/23 2143  WBC 12.4*  RBC 5.16  HGB 15.0  HCT 41.5  MCV 80.4  MCH 29.1  MCHC 36.1*  RDW 12.0  PLT 289   Thyroid No results for input(s): "TSH", "FREET4" in the last 168 hours.  BNP Recent Labs  Lab 09/10/23 2142  BNP 440.4*  DDimer No results for input(s): "DDIMER" in the last 168 hours.   Radiology  ECHOCARDIOGRAM COMPLETE Result Date: 09/11/2023    ECHOCARDIOGRAM REPORT   Patient Name:   Jonathon Bray Date of Exam: 09/11/2023 Medical Rec #:  [id number]         Height:       65.0 in Accession #:    [id number]        Weight:       183.0 lb Date of Birth:  1965-10-24         BSA:          1.905 m Patient Age:    57 years          BP:           124/98 mmHg Patient Gender: M                 HR:           109 bpm. Exam Location:  Inpatient Procedure: 2D Echo, Cardiac Doppler and Color Doppler (Both Spectral and Color            Flow Doppler were utilized during procedure). Indications:    CHF  History:        Patient has prior history of Echocardiogram examinations, most                 recent 06/02/2023. Previous Myocardial Infarction; Risk                 Factors:Diabetes, Dyslipidemia and Non-Smoker.  Sonographer:    Gelene Kelly RDCS  Referring Phys: [id number] NKIRU C OSUDE IMPRESSIONS  1. Left ventricular ejection fraction, by estimation, is 20 to 25%. The left ventricle has severely decreased function. The left ventricle demonstrates global hypokinesis, septal motion consistent with LBBB and more preserved contraction in basal posterior wall. There is mild concentric left ventricular hypertrophy. Left ventricular diastolic parameters are indeterminate.  2. Right ventricular systolic function is normal. The right ventricular size is normal. Tricuspid regurgitation signal is inadequate for assessing PA pressure.  3. The mitral valve is grossly normal. Trivial mitral valve regurgitation.  4. The aortic valve has an indeterminant number of cusps. There is severe calcifcation of the aortic valve. Aortic valve regurgitation is mild. Severe aortic valve stenosis, low flow/low gradient. Aortic valve mean gradient measures 31.0 mmHg. Dimentionless index 0.16.  5. Unable to estimate CVP. Comparison(s): Prior images reviewed side by side. LVEF has decreased further, 20-25% range. Severe aortic stenosis. FINDINGS  Left Ventricle: Left ventricular ejection fraction, by estimation, is 20 to 25%. The left ventricle has severely decreased function. The left ventricle demonstrates global hypokinesis. The left ventricular internal cavity size was normal in size. There is mild concentric left ventricular hypertrophy. Abnormal (paradoxical) septal motion, consistent with left bundle branch block. Left ventricular diastolic parameters are indeterminate. Right Ventricle: The right ventricular size is normal. No increase in right ventricular wall thickness. Right ventricular systolic function is normal. Tricuspid regurgitation signal is inadequate for assessing PA pressure. Left Atrium: Left atrial size was normal in size. Right Atrium: Right atrial size was normal in size. Pericardium: There is no evidence of pericardial effusion. Mitral Valve: The mitral valve is  grossly normal. Trivial mitral valve regurgitation. Tricuspid Valve: The tricuspid valve is grossly normal. Tricuspid valve regurgitation is trivial. Aortic Valve: The aortic valve has an indeterminant number of cusps. There is severe calcifcation of the aortic valve. There is mild to moderate aortic valve annular calcification.  Aortic valve regurgitation is mild. Severe aortic stenosis is present. Aortic valve mean gradient measures 31.0 mmHg. Aortic valve peak gradient measures 40.7 mmHg. Aortic valve area, by VTI measures 0.40 cm. Pulmonic Valve: The pulmonic valve was grossly normal. Pulmonic valve regurgitation is trivial. Aorta: The aortic root is normal in size and structure. Venous: Unable to estimate CVP. The inferior vena cava was not well visualized. IAS/Shunts: The interatrial septum was not well visualized. Additional Comments: 3D was performed not requiring image post processing on an independent workstation and was indeterminate.  LEFT VENTRICLE PLAX 2D LVIDd:         4.70 cm   Diastology LV PW:         1.50 cm   LV e' medial:    5.00 cm/s LV IVS:        1.10 cm   LV E/e' medial:  31.2 LVOT diam:     1.80 cm   LV e' lateral:   11.60 cm/s LV SV:         29        LV E/e' lateral: 13.4 LV SV Index:   15 LVOT Area:     2.54 cm  RIGHT VENTRICLE RV S prime:     9.70 cm/s TAPSE (M-mode): 2.0 cm LEFT ATRIUM             Index LA diam:        3.70 cm 1.94 cm/m LA Vol (A2C):   45.9 ml 24.10 ml/m LA Vol (A4C):   37.9 ml 19.90 ml/m LA Biplane Vol: 46.1 ml 24.20 ml/m  AORTIC VALVE AV Area (Vmax):    0.44 cm AV Area (Vmean):   0.37 cm AV Area (VTI):     0.40 cm AV Vmax:           319.00 cm/s AV Vmean:          276.000 cm/s AV VTI:            0.717 m AV Peak Grad:      40.7 mmHg AV Mean Grad:      31.0 mmHg LVOT Vmax:         55.30 cm/s LVOT Vmean:        39.900 cm/s LVOT VTI:          0.112 m LVOT/AV VTI ratio: 0.16  AORTA Ao Root diam: 2.70 cm Ao Asc diam:  3.60 cm MITRAL VALVE MV Area (PHT): 7.66 cm      SHUNTS MV Decel Time: 99 msec      Systemic VTI:  0.11 m MV E velocity: 156.00 cm/s  Systemic Diam: 1.80 cm MV A velocity: 51.90 cm/s MV E/A ratio:  3.01 Teddie Favre MD Electronically signed by Teddie Favre MD Signature Date/Time: 09/11/2023/3:36:46 PM    Final    DG CHEST PORT 1 VIEW Result Date: 09/10/2023 CLINICAL DATA:  Heart failure EXAM: PORTABLE CHEST 1 VIEW COMPARISON:  05/08/2023 FINDINGS: Cardiac shadow is prominent but stable. The lungs are well aerated bilaterally. No focal infiltrate or effusion is seen. No vascular congestion is noted. No bony abnormality is seen. IMPRESSION: No acute abnormality noted. Electronically Signed   By: Violeta Grey M.D.   On: 09/10/2023 21:44    Cardiac Studies See above, no new studies.   Patient Profile   58 y.o. male who has a history of severe AS, HFrEF (20-25%), Hypertension, CKD, HLD, and T2DM presented to [facility] for dyspnea on exertion, orthopnea and peripheral edema, he  was transferred to [facility] on 09/10/23.  Assessment & Plan  Severe Aortic Stenosis-LFLG Presented to outside hospital with volume overload, echo yesterday shows progression of aortic valve stenosis and worsening ejection fraction (20-25% from 35-40%).On exam today, he appears mildly volume up but overall improved. Patient reports significant improved in symptoms.   He is followed by Dr. Albert Huff and Dr. Luna Salinas outpatient where workup for aortic valve replacement has been started including RHC in 06/2023. HE was instructed to obtain dental cleaning, and return to CT surgery to schedule his aortic valve replacement. Patient is interested in speaking with team about other options such as a percutaneous valve due to wanting to have a shorter recovery duration.    -will resume PTA torsemide  20 mg daily  HFrEF Previous echo on 06/02/23 showed EF ( 35-40%) without regional wall motion abnormalities.  Echo yesterday on 09/11/23 showed EF ( 20-25%) with septal motion consistent  with new LBBB. BNP 440.  Has not tolerated Farxiga  in the past due to dizziness -consider increasing metoprolol  succinate 12.5mg  to PTA metoprolol  succinate 25 mg given elevated HR -continue to hold losartan  given blood pressure -diuresis above.   Dyslipidemia Continue Lipitor 40 mg  Hypertension With patient's worsening aortic stenosis would be cautious about adding back hypertensive medications, reassess at discharge.  -hold PTA losartan  -would not restart PTA hydrochlorothiazide  at discharge   5. T2DM  Continue SSI Continue to hold PTA ozempic  6. CKD (baseline ~1.2) -continue to monitor with ongoing diuresis    For questions or updates, please contact [facility] Please consult [email or web address] for contact info under     Signed, [name], PA-C  09/12/2023, 7:55 AM     ATTENDING ATTESTATION:  After conducting a review of all available clinical information with the care team, interviewing the patient, and performing a physical exam, I agree with the findings and plan described in this note.   GEN: No acute distress.   HEENT:  MMM, no JVD, no scleral icterus Cardiac: RRR, 2/6 SEM Respiratory: Clear to auscultation bilaterally. GI: Soft, nontender, non-distended  MS: No edema; No deformity. Neuro:  Nonfocal  Vasc:  +2 radial pulses  Patient feels much better after diuresis with IV Lasix  at outside hospital.  I had a long conversation about his candidacy for TAVR versus SAVR.  Given his young age and lack of significant comorbidities I believe he is best served by a surgical aortic valve replacement.  I did tell him that his heart is weakening and that this should be done relatively soon.  We will reach out to cardiothoracic surgery to make sure he is connected for outpatient care.  He tells me that he had some lower urine output on torsemide  20 mg.  Will increase to 20 mg twice daily at discharge.  Discharge today with close hospital follow-up.  Alyssa Backbone, MD Pager [phone]

## 2023-09-12 NOTE — Telephone Encounter (Signed)
 Transition Care Management Follow-up Telephone Call Date of discharge and from where: 09/10/2023 from [facility] How have you been since you were released from the hospital? Patient has been doing well.  Any questions or concerns? No  Items Reviewed: Did the pt receive and understand the discharge instructions provided? Yes  Medications obtained and verified? Yes  Other? N/a Any new allergies since your discharge? No  Dietary orders reviewed? Yes Do you have support at home? Yes   Home Care and Equipment/Supplies: Were home health services ordered? not applicable If so, what is the name of the agency? N/a   Has the agency set up a time to come to the patient's home? not applicable Were any new equipment or medical supplies ordered?  No What is the name of the medical supply agency? N/a Were you able to get the supplies/equipment? not applicable Do you have any questions related to the use of the equipment or supplies? N/A  Functional Questionnaire: (I = Independent and D = Dependent) ADLs: D  Bathing/Dressing- D  Meal Prep- D  Eating- D  Maintaining continence- D  Transferring/Ambulation- D  Managing Meds- D  Follow up appointments reviewed:  PCP Hospital f/u appt confirmed? Yes  Scheduled to see MENG HAO on 09/23/23  @ 10:05. Specialist Hospital f/u appt confirmed? N/A   Are transportation arrangements needed? No  If their condition worsens, is the pt aware to call PCP or go to the Emergency Dept.? Yes Was the patient provided with contact information for the PCP's office or ED? Yes Was to pt encouraged to call back with questions or concerns? Yes

## 2023-09-15 ENCOUNTER — Encounter (HOSPITAL_COMMUNITY): Payer: Self-pay

## 2023-09-15 ENCOUNTER — Encounter: Payer: Self-pay | Admitting: Cardiology

## 2023-09-15 ENCOUNTER — Telehealth: Payer: Self-pay | Admitting: Physician Assistant

## 2023-09-15 NOTE — Telephone Encounter (Signed)
   The patient called the answering service after-hours today. He saw his endocrinologist earlier today and noted his BP was 72/52 at that visit. He reports they did not send him to the hospital. He came home and obtained BP of 74/55 on recheck. He feels OK now but felt dizzy earlier. He has severe AS, pending surgical evaluation. I relayed that a blood pressure in the 70s can be dangerous especially given his aortic stenosis - unfortunately needs in person evaluation - recommended he contact EMS to be transported to the hospital emergently. He asked if he should just drive to [facility]. I advised against this, warning that this low of blood pressure can cause syncope or even more dangerous cardiac events. The patient verbalized understanding and gratitude. His wife is also there with him at this time as well.  Will forward to Dr. Albert Huff as Arlie Lain.  Ardian Haberland N Bralynn Velador, PA-C

## 2023-09-15 NOTE — Progress Notes (Signed)
 ON-CALL CARDIOLOGY 09/15/23  Patient's name: Jonathon Bray.   MRN: [id number].    DOB: 11/25/1965 Primary care provider: Orlena Bitters, MD. Primary cardiologist: Olinda Bertrand, DO, [facility]  Interaction regarding this patient's care today: Received a phone call from ED physician Dr. Arnell Lange at [facility].  Patient presented with presyncope with systolic blood pressures at 70 mmHg.  Initial vital signs in the ED noted a blood pressure of 88/45 and on repeat check was 100/65 with a pulse of 110 bpm  Patient is not having any active chest pain but some shortness of breath with effort related activities which is chronic and stable.  Based on the labs that I was provided by the ED provider. Troponin is 217. BMP 2800. Creatinine 1.7 and BUN 62  Currently hemodynamically stable.  Not been taking his losartan .  Did take his torsemide  this morning at 10 AM  Patient has severe aortic stenosis with underlying reduced LVEF awaiting valve replacement surgery.  Recently discharged from the hospital from: 09/12/2023.  Impression: Severe aortic stenosis. Near-syncope. Cardiomyopathy   Recommendations: Will except the patient presents for hospital. Progressive bed requested as CCU bed availability is short Avoid AV nodal blocking agents and BP meds given his close from last time severe AS and cardiomyopathy. Monitor him clinically. Will need to have cardiothoracic surgery team see him when he comes [facility] the [facility] for evaluation of AVR. Will need medicine consult to manage noncardiac comorbidities, poorly controlled diabetic and etc  Olinda Bertrand, DO, [facility]  A Division of [facility] [address], [place], [place] [address]  [place], [place] [address] 09/15/2023 8:04 PM

## 2023-09-15 NOTE — Telephone Encounter (Signed)
 Agree.  Will need to have CT surgery see him.   Dorrine Montone Chardon, DO, [facility]

## 2023-09-16 ENCOUNTER — Telehealth: Payer: Self-pay | Admitting: Cardiology

## 2023-09-16 ENCOUNTER — Inpatient Hospital Stay (HOSPITAL_COMMUNITY)
Admission: AD | Admit: 2023-09-16 | Discharge: 2023-09-28 | DRG: 219 | Disposition: A | Discharge status: Home / Self Care | Source: Other Acute Inpatient Hospital | Attending: Thoracic Surgery (Cardiothoracic Vascular Surgery) | Admitting: Thoracic Surgery (Cardiothoracic Vascular Surgery)

## 2023-09-16 ENCOUNTER — Encounter (HOSPITAL_COMMUNITY): Payer: Self-pay | Admitting: Cardiology

## 2023-09-16 ENCOUNTER — Other Ambulatory Visit: Payer: Self-pay

## 2023-09-16 DIAGNOSIS — I252 Old myocardial infarction: Secondary | ICD-10-CM

## 2023-09-16 DIAGNOSIS — Z833 Family history of diabetes mellitus: Secondary | ICD-10-CM | POA: Diagnosis not present

## 2023-09-16 DIAGNOSIS — Z6835 Body mass index (BMI) 35.0-35.9, adult: Secondary | ICD-10-CM

## 2023-09-16 DIAGNOSIS — I5023 Acute on chronic systolic (congestive) heart failure: Secondary | ICD-10-CM | POA: Diagnosis not present

## 2023-09-16 DIAGNOSIS — Z7901 Long term (current) use of anticoagulants: Secondary | ICD-10-CM | POA: Diagnosis not present

## 2023-09-16 DIAGNOSIS — E785 Hyperlipidemia, unspecified: Secondary | ICD-10-CM | POA: Diagnosis present

## 2023-09-16 DIAGNOSIS — Z8249 Family history of ischemic heart disease and other diseases of the circulatory system: Secondary | ICD-10-CM | POA: Diagnosis not present

## 2023-09-16 DIAGNOSIS — R0602 Shortness of breath: Secondary | ICD-10-CM | POA: Diagnosis not present

## 2023-09-16 DIAGNOSIS — Z794 Long term (current) use of insulin: Secondary | ICD-10-CM

## 2023-09-16 DIAGNOSIS — E119 Type 2 diabetes mellitus without complications: Secondary | ICD-10-CM | POA: Diagnosis not present

## 2023-09-16 DIAGNOSIS — I509 Heart failure, unspecified: Secondary | ICD-10-CM

## 2023-09-16 DIAGNOSIS — N179 Acute kidney failure, unspecified: Secondary | ICD-10-CM | POA: Diagnosis not present

## 2023-09-16 DIAGNOSIS — I471 Supraventricular tachycardia, unspecified: Secondary | ICD-10-CM | POA: Diagnosis present

## 2023-09-16 DIAGNOSIS — T17900A Unspecified foreign body in respiratory tract, part unspecified causing asphyxiation, initial encounter: Secondary | ICD-10-CM

## 2023-09-16 DIAGNOSIS — E669 Obesity, unspecified: Secondary | ICD-10-CM | POA: Diagnosis present

## 2023-09-16 DIAGNOSIS — E7800 Pure hypercholesterolemia, unspecified: Secondary | ICD-10-CM | POA: Diagnosis present

## 2023-09-16 DIAGNOSIS — I959 Hypotension, unspecified: Secondary | ICD-10-CM | POA: Diagnosis present

## 2023-09-16 DIAGNOSIS — D72829 Elevated white blood cell count, unspecified: Secondary | ICD-10-CM | POA: Diagnosis not present

## 2023-09-16 DIAGNOSIS — J9811 Atelectasis: Secondary | ICD-10-CM | POA: Diagnosis not present

## 2023-09-16 DIAGNOSIS — Z888 Allergy status to other drugs, medicaments and biological substances status: Secondary | ICD-10-CM | POA: Diagnosis not present

## 2023-09-16 DIAGNOSIS — Z7982 Long term (current) use of aspirin: Secondary | ICD-10-CM

## 2023-09-16 DIAGNOSIS — E1122 Type 2 diabetes mellitus with diabetic chronic kidney disease: Secondary | ICD-10-CM | POA: Diagnosis present

## 2023-09-16 DIAGNOSIS — N182 Chronic kidney disease, stage 2 (mild): Secondary | ICD-10-CM | POA: Diagnosis present

## 2023-09-16 DIAGNOSIS — E1142 Type 2 diabetes mellitus with diabetic polyneuropathy: Secondary | ICD-10-CM | POA: Diagnosis present

## 2023-09-16 DIAGNOSIS — I130 Hypertensive heart and chronic kidney disease with heart failure and stage 1 through stage 4 chronic kidney disease, or unspecified chronic kidney disease: Secondary | ICD-10-CM | POA: Diagnosis present

## 2023-09-16 DIAGNOSIS — I350 Nonrheumatic aortic (valve) stenosis: Secondary | ICD-10-CM | POA: Diagnosis present

## 2023-09-16 DIAGNOSIS — I428 Other cardiomyopathies: Secondary | ICD-10-CM | POA: Diagnosis present

## 2023-09-16 DIAGNOSIS — R1310 Dysphagia, unspecified: Secondary | ICD-10-CM | POA: Diagnosis not present

## 2023-09-16 DIAGNOSIS — Z743 Need for continuous supervision: Secondary | ICD-10-CM | POA: Diagnosis not present

## 2023-09-16 DIAGNOSIS — Z953 Presence of xenogenic heart valve: Secondary | ICD-10-CM | POA: Diagnosis present

## 2023-09-16 DIAGNOSIS — I447 Left bundle-branch block, unspecified: Secondary | ICD-10-CM | POA: Diagnosis present

## 2023-09-16 DIAGNOSIS — Z79899 Other long term (current) drug therapy: Secondary | ICD-10-CM | POA: Diagnosis not present

## 2023-09-16 DIAGNOSIS — I10 Essential (primary) hypertension: Secondary | ICD-10-CM | POA: Diagnosis not present

## 2023-09-16 DIAGNOSIS — R079 Chest pain, unspecified: Secondary | ICD-10-CM | POA: Diagnosis not present

## 2023-09-16 DIAGNOSIS — Z7985 Long-term (current) use of injectable non-insulin antidiabetic drugs: Secondary | ICD-10-CM

## 2023-09-16 DIAGNOSIS — Z881 Allergy status to other antibiotic agents status: Secondary | ICD-10-CM

## 2023-09-16 LAB — CBC WITH DIFFERENTIAL/PLATELET
Abs Immature Granulocytes: 0.05 10*3/uL (ref 0.00–0.07)
Basophils Absolute: 0 10*3/uL (ref 0.0–0.1)
Basophils Relative: 0 %
Eosinophils Absolute: 0.4 10*3/uL (ref 0.0–0.5)
Eosinophils Relative: 4 %
HCT: 42 % (ref 39.0–52.0)
Hemoglobin: 15 g/dL (ref 13.0–17.0)
Immature Granulocytes: 0 %
Lymphocytes Relative: 27 %
Lymphs Abs: 3.2 10*3/uL (ref 0.7–4.0)
MCH: 29.1 pg (ref 26.0–34.0)
MCHC: 35.7 g/dL (ref 30.0–36.0)
MCV: 81.6 fL (ref 80.0–100.0)
Monocytes Absolute: 0.7 10*3/uL (ref 0.1–1.0)
Monocytes Relative: 6 %
Neutro Abs: 7.4 10*3/uL (ref 1.7–7.7)
Neutrophils Relative %: 63 %
Platelets: 383 10*3/uL (ref 150–400)
RBC: 5.15 MIL/uL (ref 4.22–5.81)
RDW: 12 % (ref 11.5–15.5)
WBC: 11.9 10*3/uL — ABNORMAL HIGH (ref 4.0–10.5)
nRBC: 0 % (ref 0.0–0.2)

## 2023-09-16 LAB — MAGNESIUM: Magnesium: 1.7 mg/dL (ref 1.7–2.4)

## 2023-09-16 LAB — COMPREHENSIVE METABOLIC PANEL WITH GFR
ALT: 18 U/L (ref 0–44)
AST: 15 U/L (ref 15–41)
Albumin: 3.3 g/dL — ABNORMAL LOW (ref 3.5–5.0)
Alkaline Phosphatase: 60 U/L (ref 38–126)
Anion gap: 10 (ref 5–15)
BUN: 52 mg/dL — ABNORMAL HIGH (ref 6–20)
CO2: 30 mmol/L (ref 22–32)
Calcium: 8.9 mg/dL (ref 8.9–10.3)
Chloride: 94 mmol/L — ABNORMAL LOW (ref 98–111)
Creatinine, Ser: 1.3 mg/dL — ABNORMAL HIGH (ref 0.61–1.24)
GFR, Estimated: >60 mL/min (ref >60)
Glucose, Bld: 217 mg/dL — ABNORMAL HIGH (ref 70–99)
Potassium: 3.7 mmol/L (ref 3.5–5.1)
Sodium: 134 mmol/L — ABNORMAL LOW (ref 135–145)
Total Bilirubin: 1 mg/dL (ref 0.0–1.2)
Total Protein: 7.1 g/dL (ref 6.5–8.1)

## 2023-09-16 LAB — GLUCOSE, CAPILLARY
Glucose-Capillary: 202 mg/dL — ABNORMAL HIGH (ref 70–99)
Glucose-Capillary: 214 mg/dL — ABNORMAL HIGH (ref 70–99)

## 2023-09-16 LAB — BRAIN NATRIURETIC PEPTIDE: B Natriuretic Peptide: 376.4 pg/mL — ABNORMAL HIGH (ref 0.0–100.0)

## 2023-09-16 LAB — MRSA NEXT GEN BY PCR, NASAL: MRSA by PCR Next Gen: DETECTED — AB

## 2023-09-16 MED ORDER — INSULIN ASPART 100 UNIT/ML IJ SOLN
0.0000 [IU] | Freq: Three times a day (TID) | INTRAMUSCULAR | Status: DC
  Administered 2023-09-16 – 2023-09-17 (×3): 5 [IU] via SUBCUTANEOUS
  Administered 2023-09-17 – 2023-09-18 (×2): 3 [IU] via SUBCUTANEOUS
  Administered 2023-09-18: 11 [IU] via SUBCUTANEOUS
  Administered 2023-09-18: 8 [IU] via SUBCUTANEOUS
  Administered 2023-09-19: 3 [IU] via SUBCUTANEOUS
  Administered 2023-09-19: 11 [IU] via SUBCUTANEOUS
  Administered 2023-09-19: 3 [IU] via SUBCUTANEOUS
  Administered 2023-09-20: 2 [IU] via SUBCUTANEOUS

## 2023-09-16 MED ORDER — CHLORHEXIDINE GLUCONATE CLOTH 2 % EX PADS
6.0000 | MEDICATED_PAD | Freq: Every day | CUTANEOUS | Status: AC
  Administered 2023-09-17 – 2023-09-20 (×4): 6 via TOPICAL

## 2023-09-16 MED ORDER — ORAL CARE MOUTH RINSE: 15.0000 mL | OROMUCOSAL | Status: DC | PRN

## 2023-09-16 MED ORDER — ENOXAPARIN SODIUM 40 MG/0.4ML IJ SOSY
40.0000 mg | PREFILLED_SYRINGE | INTRAMUSCULAR | Status: DC
  Administered 2023-09-16 – 2023-09-20 (×5): 40 mg via SUBCUTANEOUS
  Filled 2023-09-16 (×5): qty 0.4

## 2023-09-16 MED ORDER — MUPIROCIN 2 % EX OINT
1.0000 | TOPICAL_OINTMENT | Freq: Two times a day (BID) | CUTANEOUS | Status: AC
  Administered 2023-09-17 – 2023-09-21 (×9): 1 via NASAL
  Filled 2023-09-16: qty 22

## 2023-09-16 MED ORDER — ACETAMINOPHEN 325 MG PO TABS
650.0000 mg | ORAL_TABLET | ORAL | Status: DC | PRN
  Administered 2023-09-16 – 2023-09-20 (×3): 650 mg via ORAL
  Filled 2023-09-16 (×3): qty 2

## 2023-09-16 MED ORDER — INSULIN GLARGINE-YFGN 100 UNIT/ML ~~LOC~~ SOLN
20.0000 [IU] | SUBCUTANEOUS | Status: DC
  Administered 2023-09-16 – 2023-09-18 (×3): 20 [IU] via SUBCUTANEOUS
  Filled 2023-09-16 (×4): qty 0.2

## 2023-09-16 MED ORDER — SODIUM CHLORIDE 0.9 % IV SOLN: 250.0000 mL | INTRAVENOUS | Status: AC | PRN

## 2023-09-16 MED ORDER — ONDANSETRON HCL 4 MG/2ML IJ SOLN: 4.0000 mg | Freq: Four times a day (QID) | INTRAMUSCULAR | Status: DC | PRN

## 2023-09-16 MED ORDER — SODIUM CHLORIDE 0.9% FLUSH: 3.0000 mL | INTRAVENOUS | Status: DC | PRN

## 2023-09-16 MED ORDER — GABAPENTIN 100 MG PO CAPS
100.0000 mg | ORAL_CAPSULE | Freq: Every day | ORAL | Status: DC
  Administered 2023-09-16 – 2023-09-17 (×2): 100 mg via ORAL
  Filled 2023-09-16 (×2): qty 1

## 2023-09-16 MED ORDER — SODIUM CHLORIDE 0.9% FLUSH
3.0000 mL | Freq: Two times a day (BID) | INTRAVENOUS | Status: DC
  Administered 2023-09-16 – 2023-09-21 (×10): 3 mL via INTRAVENOUS

## 2023-09-16 NOTE — Telephone Encounter (Signed)
 Spoke to patient he stated he is currently admitted at [facility] in [place].He is going to be transferred to [facility] hospital this afternoon.He wanted to know if he is going to have heart surgery.Advised he will need to ask RN taking care of him.

## 2023-09-16 NOTE — Progress Notes (Signed)
      [address]       [name] [address]             [phone]      Pt seen and examined, full consult note to follow  Jonathon Bray is a 58 year old man with a history of hypertension, type 2 insulin -dependent diabetes, stage II chronic kidney disease, obesity ascending aortic ectasia, and aortic stenosis.    Presented in early march after echo showed progression of AS and low EF.  CBGs were uncontrolled and needed dental eval.  No CAD by cath.  Was to see dental then follow up in office but has not been seen.  Presented with syncopal episode.  Has seen a dentist and underwent cleaning.  A1c still elevated at 9.0, but we need to go ahead with AVR.  I discussed the general nature of the procedure, including the need for general anesthesia, the incisions to be used, the use of cardiopulmonary bypass, and the use of temporary pacemaker wires and drainage tubes postoperatively with [name].  We discussed the expected hospital stay, overall recovery and short and long term outcomes. I informed them of the indications, risks, benefits and alternatives.   He understands the risks include, but are not limited to death, stroke, MI, DVT/PE, bleeding, possible need for transfusion, infections, cardiac arrhythmias, heart block requiring permanent pacemaker, as well as other organ system dysfunction including respiratory, renal, or GI complications.    Discussed mechanical v tissue vlaves.  At his age mechanical valve preferred as best long term outcome but requires lifelong anticoagulation.  He does not want to be on coumadin and prefers tissue valve even with risk of early failure and possible need for redo procedure in relatively short time frame.  He will think it over.  Tentatively plan AVR next Thursday unless schedule changes permit earlier date.  Milon Aloe Luna Salinas, MD [facility] [phone]

## 2023-09-16 NOTE — TOC CM/SW Note (Signed)
 Transition of Care [facility]) - Inpatient Brief Assessment   Patient Details  Name: Jonathon Bray MRN: [id number] Date of Birth: 11-11-1965  Transition of [facility] CM/SW Contact:    Juliane Och, LCSW Phone Number: 09/16/2023, 4:54 PM   Clinical Narrative:  4:55 PM Per chart review, patient resides at home with spouse. Patient has a PCP and insurance. Patient does not have SNF/HH/DME history. Patient's preferred pharmacy's are [facility] [address] and [facility]. No TOC needs were identified at this time. TOC will continue to follow and be available to assist.  Transition of Care Asessment: Insurance and Status: Insurance coverage has been reviewed Patient has primary care physician: Yes Home environment has been reviewed: Private Residence Prior level of function:: N/A Prior/Current Home Services: No current home services Social Drivers of Health Review: SDOH reviewed no interventions necessary Readmission risk has been reviewed: Yes Transition of care needs: no transition of care needs at this time

## 2023-09-16 NOTE — Telephone Encounter (Signed)
 Patient is following up. He's currrently at [facility] in [place]. He says he called in yesterday and was advised to go to the hospital, and he went yesterday around 5:30 PM, but still hasn't received any feedback regarding low BP. He says BP is currently 101/72 and he wants to leave, but he doesn't know what to do. He says he hasn't been given any direction or medication. Please advise.

## 2023-09-16 NOTE — H&P (View-Only) (Signed)
      [address]       Jonathon Bray [address]             [phone]      Pt seen and examined, full consult note to follow  Jonathon Bray is a 58 year old man with a history of hypertension, type 2 insulin -dependent diabetes, stage II chronic kidney disease, obesity ascending aortic ectasia, and aortic stenosis.    Presented in early march after echo showed progression of AS and low EF.  CBGs were uncontrolled and needed dental eval.  No CAD by cath.  Was to see dental then follow up in office but has not been seen.  Presented with syncopal episode.  Has seen a dentist and underwent cleaning.  A1c still elevated at 9.0, but we need to go ahead with AVR.  I discussed the general nature of the procedure, including the need for general anesthesia, the incisions to be used, the use of cardiopulmonary bypass, and the use of temporary pacemaker wires and drainage tubes postoperatively with [name].  We discussed the expected hospital stay, overall recovery and short and long term outcomes. I informed them of the indications, risks, benefits and alternatives.   He understands the risks include, but are not limited to death, stroke, MI, DVT/PE, bleeding, possible need for transfusion, infections, cardiac arrhythmias, heart block requiring permanent pacemaker, as well as other organ system dysfunction including respiratory, renal, or GI complications.    Discussed mechanical v tissue vlaves.  At his age mechanical valve preferred as best long term outcome but requires lifelong anticoagulation.  He does not want to be on coumadin and prefers tissue valve even with risk of early failure and possible need for redo procedure in relatively short time frame.  He will think it over.  Tentatively plan AVR next Thursday unless schedule changes permit earlier date.  Milon Aloe Luna Salinas, MD [facility] [phone]

## 2023-09-16 NOTE — H&P (Addendum)
 Cardiology Admission History and Physical   Patient ID: Jonathon Bray MRN: [id number]; DOB: 1965-09-15   Admission date: 09/16/2023  PCP:  Orlena Bitters, MD   [facility] Providers Cardiologist:  Olinda Bertrand, DO       Chief Complaint:  hypotension/presyncope  Patient Profile: Jonathon Bray is a 58 y.o. male with severe AS, HFrEF, HLD, and T2DM who is being seen 09/16/2023 for the evaluation of hypotension and presyncope.  History of Present Illness: Jonathon Bray recently presented to outside hospital with hypervolemia, DOE, orthopnea. He was initially treated with IV lasix  and then transferred to [facility] on 6/7. TTE at [facility] found LVEF 20-25% with spetal motion consistent with new LBBB. Following diuresis, patient had significant clinical improvement and was discharged on Torsemide  20mg  BID, Metoprolol  Succinate 25mg . Losartan /hydrochlorothiazide  held due to low BP. Patient historically intolerant of SGLT2.   He is followed in outpatient setting by Dr. Albert Huff and since earlier this year, has been worked up for aortic valve replacement (had RHC in March). Patient has also been seen in outpatient setting by CT surgery and is pending surgical aortic valve repair (advised over TAVR given age).   On 6/12, patient presented to his endocrinologist for routine appointment and was found to have BP of 72/30mmHg. Patient was apparently allowed to go home at which time he rechecked his BP and found 74/80mmHg. Patient called our after hours number and was advised to call EMS for transportation to the ED. He was taken to [facility] where BP was improved 100/25mmHg. Given recent admission, patient transferred back to [facility].   On exam today, patient denies any significant dyspnea on exertion or orthopnea since his discharge. His primary symptom has been dizziness with near syncope since yesterday. He also reports intermittent burning sensation in his feet.   Past Medical History:  Diagnosis Date    Aortic stenosis    Diabetes mellitus without complication (HCC)    type two - LIBRE SYSTEM ON RIGHT UPPER OUTER ARM   Hypertension    PONV (postoperative nausea and vomiting)    Past Surgical History:  Procedure Laterality Date   COLONOSCOPY  2020   CORONARY ANGIOGRAPHY N/A 05/10/2022   Procedure: CORONARY ANGIOGRAPHY;  Surgeon: Knox Perl, MD;  Location: [facility] INVASIVE CV LAB;  Service: Cardiovascular;  Laterality: N/A;   KYPHOPLASTY N/A 06/19/2020   Procedure: ATTEMPTED LUMBAR TWO KYPHOPLASTY;  Surgeon: Audie Bleacher, MD;  Location: [facility] OR;  Service: Neurosurgery;  Laterality: N/A;   RIGHT HEART CATH AND CORONARY ANGIOGRAPHY N/A 06/16/2023   Procedure: RIGHT HEART CATH AND CORONARY ANGIOGRAPHY;  Surgeon: Kyra Phy, MD;  Location: [facility] INVASIVE CV LAB;  Service: Cardiovascular;  Laterality: N/A;     Medications Prior to Admission: Prior to Admission medications   Medication Sig Start Date End Date Taking? Authorizing Provider  atorvastatin  (LIPITOR) 40 MG tablet Take 1 tablet (40 mg total) by mouth at bedtime. 06/02/23  Yes Tolia, Sunit, DO  insulin  lispro (HUMALOG ) 100 UNIT/ML KwikPen Inject 16 Units into the skin 3 (three) times daily. 08/08/17  Yes [provider]  OZEMPIC, 0.25 OR 0.5 MG/DOSE, 2 MG/3ML SOPN Inject 0.25 mg into the skin once a week. 08/08/23  Yes [provider]  torsemide  (DEMADEX ) 20 MG tablet Take 1 tablet (20 mg total) by mouth 2 (two) times daily. 09/12/23  Yes Lockwood, Logan N, PA-C  TRESIBA FLEXTOUCH 200 UNIT/ML FlexTouch Pen Inject 54 Units into the skin daily. 08/08/23  Yes [provider]  Insulin  NPH, Human,, Isophane, (HUMULIN  N KWIKPEN) 100 UNIT/ML Kiwkpen Inject 20 Units into the skin in the morning and at bedtime. Patient not taking: Reported on 09/11/2023 08/08/17   [provider]  metoprolol  succinate (TOPROL  XL) 25 MG 24 hr tablet Take 1 tablet (25 mg total) by mouth daily. Patient not taking: Reported on 09/11/2023 07/27/23    Tolia, Sunit, DO     Allergies:    Allergies  Allergen Reactions   Invokana [Canagliflozin] Other (See Comments)    Dizziness Syncope    Levaquin [Levofloxacin] Rash   Zestril  [Lisinopril ] Cough    Social History:   Social History   Socioeconomic History   Marital status: Married    Spouse name: Not on file   Number of children: 3   Years of education: Not on file   Highest education level: Not on file  Occupational History   Not on file  Tobacco Use   Smoking status: Never   Smokeless tobacco: Never  Vaping Use   Vaping status: Never Used  Substance and Sexual Activity   Alcohol use: No   Drug use: No   Sexual activity: Yes  Other Topics Concern   Not on file  Social History Narrative   Not on file   Social Drivers of Health   Financial Resource Strain: Not on file  Food Insecurity: No Food Insecurity (09/16/2023)   Hunger Vital Sign    Worried About Running Out of Food in the Last Year: Never true    Ran Out of Food in the Last Year: Never true  Transportation Needs: No Transportation Needs (09/16/2023)   PRAPARE - Administrator, Civil Service (Medical): No    Lack of Transportation (Non-Medical): No  Physical Activity: Not on file  Stress: Not on file  Social Connections: Unknown (08/17/2021)   Received from [facility]   Social Network    Social Network: Not on file  Intimate Partner Violence: Not At Risk (09/16/2023)   Humiliation, Afraid, Rape, and Kick questionnaire    Fear of Current or Ex-Partner: No    Emotionally Abused: No    Physically Abused: No    Sexually Abused: No     Family History:   The patient's family history includes Diabetes in his brother, father, and mother; Heart attack in his brother and sister; Heart disease in his father; Hypertension in his sister; Other (age of onset: 43) in his brother; Other (age of onset: 5) in his brother.    ROS:  Please see the history of present illness.  All other ROS reviewed and  negative.     Physical Exam/Data: Vitals:   09/16/23 1447 09/16/23 1540  BP: 125/85 104/84  Pulse: (!) 104 99  Resp: 17   Temp: 97.8 F (36.6 C) 97.7 F (36.5 C)  TempSrc: Oral Oral  SpO2: 95% 95%  Weight: 78 kg   Height: 5' 5 (1.651 m)    No intake or output data in the 24 hours ending 09/16/23 1541    09/16/2023    2:47 PM 09/12/2023    5:36 AM 09/11/2023    5:00 AM  Last 3 Weights  Weight (lbs) 171 lb 15.3 oz 190 lb 8 oz 182 lb 15.7 oz  Weight (kg) 78 kg 86.41 kg 83 kg     Body mass index is 28.62 kg/m.   EKG:  ECG pending  Relevant CV Studies:  09/11/23 TTE  IMPRESSIONS     1. Left ventricular ejection  fraction, by estimation, is 20 to 25%. The  left ventricle has severely decreased function. The left ventricle  demonstrates global hypokinesis, septal motion consistent with LBBB and  more preserved contraction in basal  posterior wall. There is mild concentric left ventricular hypertrophy.  Left ventricular diastolic parameters are indeterminate.   2. Right ventricular systolic function is normal. The right ventricular  size is normal. Tricuspid regurgitation signal is inadequate for assessing  PA pressure.   3. The mitral valve is grossly normal. Trivial mitral valve  regurgitation.   4. The aortic valve has an indeterminant number of cusps. There is severe  calcifcation of the aortic valve. Aortic valve regurgitation is mild.  Severe aortic valve stenosis, low flow/low gradient. Aortic valve mean  gradient measures 31.0 mmHg.  Dimentionless index 0.16.   5. Unable to estimate CVP.   Comparison(s): Prior images reviewed side by side. LVEF has decreased  further, 20-25% range. Severe aortic stenosis.   FINDINGS   Left Ventricle: Left ventricular ejection fraction, by estimation, is 20  to 25%. The left ventricle has severely decreased function. The left  ventricle demonstrates global hypokinesis. The left ventricular internal  cavity size was normal in  size. There  is mild concentric left ventricular hypertrophy. Abnormal (paradoxical)  septal motion, consistent with left bundle branch block. Left ventricular  diastolic parameters are indeterminate.   Right Ventricle: The right ventricular size is normal. No increase in  right ventricular wall thickness. Right ventricular systolic function is  normal. Tricuspid regurgitation signal is inadequate for assessing PA  pressure.   Left [facility]: Left atrial size was normal in size.   Right [facility]: Right atrial size was normal in size.   Pericardium: There is no evidence of pericardial effusion.   Mitral Valve: The mitral valve is grossly normal. Trivial mitral valve  regurgitation.   Tricuspid Valve: The tricuspid valve is grossly normal. Tricuspid valve  regurgitation is trivial.   Aortic Valve: The aortic valve has an indeterminant number of cusps. There  is severe calcifcation of the aortic valve. There is mild to moderate  aortic valve annular calcification. Aortic valve regurgitation is mild.  Severe aortic stenosis is present.  Aortic valve mean gradient measures 31.0 mmHg. Aortic valve peak gradient  measures 40.7 mmHg. Aortic valve area, by VTI measures 0.40 cm.   Pulmonic Valve: The pulmonic valve was grossly normal. Pulmonic valve  regurgitation is trivial.   Aorta: The aortic root is normal in size and structure.   Venous: Unable to estimate CVP. The inferior vena cava was not well  visualized.   IAS/Shunts: The interatrial septum was not well visualized.   Additional Comments: 3D was performed not requiring image post processing  on an independent workstation and was indeterminate.   Laboratory Data: High Sensitivity Troponin:  No results for input(s): TROPONINIHS in the last 720 hours.    Chemistry Recent Labs  Lab 09/11/23 0403 09/12/23 0454  NA 136 136  K 3.5 4.1  CL 98 100  CO2 28 26  GLUCOSE 170* 191*  BUN 27* 30*  CREATININE 1.20 1.23  CALCIUM   8.4* 8.9  GFRNONAA >60 >60  ANIONGAP 10 10    Recent Labs  Lab 09/10/23 2143  PROT 6.8  ALBUMIN 3.0*  AST 17  ALT 14  ALKPHOS 46  BILITOT 1.0   Lipids No results for input(s): CHOL, TRIG, HDL, LABVLDL, LDLCALC, CHOLHDL in the last 168 hours. Hematology Recent Labs  Lab 09/10/23 2143  WBC 12.4*  RBC  5.16  HGB 15.0  HCT 41.5  MCV 80.4  MCH 29.1  MCHC 36.1*  RDW 12.0  PLT 289   Thyroid No results for input(s): TSH, FREET4 in the last 168 hours. BNP Recent Labs  Lab 09/10/23 2142  BNP 440.4*    DDimer No results for input(s): DDIMER in the last 168 hours.  Radiology/Studies:  No results found.   Assessment and Plan:  Severe aortic stenosis Patient has completed outpatient workup for aortic valve intervention, tentatively planned for surgical replacement (recommended over TAVR given age). Patient now admitted for the second time in a week, now with hypotension and near syncope.  Check BNP Will hold Torsemide , ARB, beta blocker given patient's tenuous BP status with severe AS.  CT surgery is aware of patient's admission, they will see and consider surgery this admission pending OR and surgeon availability. Suspect he will need to remain admitted until valve repair.  Hypertension Patient sent to ED after being found with low BP at his endocrinologist yesterday. Tenuous BP likely a result of his severe aortic stenosis. Plan to hold Toprol  XL and Torsemide  at this time.  Hyperlipidemia Continue Lipitor 40mg .  DM type II Patient followed by [facility], was seen in office on 6/12. Per office note, plan was for Ozempic 0.5mg  weekly with Tresiba 54 units daily, Humalog  16 units with meals.  SSI ordered Will order half dose home long acting insulin  Start gabapentin 100mg  at bedtime  Risk Assessment/Risk Scores:      New [place]  Heart Association (NYHA) Functional Class NYHA Class II   Code Status: Full Code  Severity of Illness: The  appropriate patient status for this patient is INPATIENT. Inpatient status is judged to be reasonable and necessary in order to provide the required intensity of service to ensure the patient's safety. The patient's presenting symptoms, physical exam findings, and initial radiographic and laboratory data in the context of their chronic comorbidities is felt to place them at high risk for further clinical deterioration. Furthermore, it is not anticipated that the patient will be medically stable for discharge from the hospital within 2 midnights of admission.   * I certify that at the point of admission it is my clinical judgment that the patient will require inpatient hospital care spanning beyond 2 midnights from the point of admission due to high intensity of service, high risk for further deterioration and high frequency of surveillance required.*  For questions or updates, please contact [facility] Please consult [email or web address] for contact info under     Signed, Leala Prince, PA-C  09/16/2023 3:41 PM   ATTENDING ATTESTATION:  After conducting a review of all available clinical information with the care team, interviewing the patient, and performing a physical exam, I agree with the findings and plan described in this note.   GEN: No acute distress.   HEENT:  MMM, no JVD, no scleral icterus Cardiac: RRR, 2/6 SEM Respiratory: Clear to auscultation bilaterally. GI: Soft, nontender, non-distended  MS: No edema; No deformity. Neuro:  Nonfocal  Vasc:  +2 radial pulses  The patient is a 58 year old male with severe aortic stenosis, nonischemic cardiomyopathy with an ejection fraction of 20 to 25%, hyperlipidemia, and type 2 diabetes complicated by peripheral neuropathy who was discharged earlier this week.  Due to low blood pressures he was discharged on metoprolol  succinate at bedtime and torsemide  20 mg twice daily and his losartan  and hydrochlorothiazide  were held.  He was doing  well but apparently over the last day  he developed increasing lightheadedness.  He was seen at his endocrinologist office and his blood pressure was in the 70s.  He presented to [facility] where his blood pressure was improved to 100/65.  He was then transferred here for further management.  Currently he is normotensive and feels well.  He was scheduled to see cardiac surgery next Tuesday.  Given his presyncopal symptoms we will arrange for cardiac surgery to see the patient while hospitalized and hopefully consider expedited aortic valve replacement.  This was discussed with the patient and his significant other at the bedside.  Surgery office has been contacted.  Will hold torsemide  and and Toprol  for now.   Alyssa Backbone, MD Pager [phone]

## 2023-09-17 DIAGNOSIS — I350 Nonrheumatic aortic (valve) stenosis: Secondary | ICD-10-CM | POA: Diagnosis not present

## 2023-09-17 LAB — BASIC METABOLIC PANEL WITH GFR
Anion gap: 9 (ref 5–15)
BUN: 50 mg/dL — ABNORMAL HIGH (ref 6–20)
CO2: 30 mmol/L (ref 22–32)
Calcium: 9 mg/dL (ref 8.9–10.3)
Chloride: 95 mmol/L — ABNORMAL LOW (ref 98–111)
Creatinine, Ser: 1.36 mg/dL — ABNORMAL HIGH (ref 0.61–1.24)
GFR, Estimated: >60 mL/min (ref >60)
Glucose, Bld: 172 mg/dL — ABNORMAL HIGH (ref 70–99)
Potassium: 3.5 mmol/L (ref 3.5–5.1)
Sodium: 134 mmol/L — ABNORMAL LOW (ref 135–145)

## 2023-09-17 LAB — GLUCOSE, CAPILLARY
Glucose-Capillary: 165 mg/dL — ABNORMAL HIGH (ref 70–99)
Glucose-Capillary: 250 mg/dL — ABNORMAL HIGH (ref 70–99)
Glucose-Capillary: 203 mg/dL — ABNORMAL HIGH (ref 70–99)
Glucose-Capillary: 229 mg/dL — ABNORMAL HIGH (ref 70–99)

## 2023-09-17 MED ORDER — POTASSIUM CHLORIDE CRYS ER 20 MEQ PO TBCR
40.0000 meq | EXTENDED_RELEASE_TABLET | Freq: Once | ORAL | Status: AC
  Administered 2023-09-17: 40 meq via ORAL
  Filled 2023-09-17: qty 2

## 2023-09-17 NOTE — Progress Notes (Signed)
   Rounding Note    Patient Name: Jonathon Bray Date of Encounter: 09/17/2023  [facility] Cardiologist: Olinda Bertrand, DO   Subjective   NAEO. Wife at bedside.   Vital Signs    Vitals:   09/16/23 2005 09/16/23 2338 09/17/23 0339 09/17/23 0635  BP: 110/89 100/76 104/87   Pulse: (!) 113 (!) 109 95 99  Resp:   19   Temp: 98.1 F (36.7 C) 98 F (36.7 C) 97.7 F (36.5 C)   TempSrc: Oral Oral    SpO2: 92% 93% 94% (!) 89%  Weight:    86.2 kg  Height:        Intake/Output Summary (Last 24 hours) at 09/17/2023 0744 Last data filed at 09/16/2023 1823 Gross per 24 hour  Intake 360 ml  Output --  Net 360 ml      09/17/2023    6:35 AM 09/16/2023    2:47 PM 09/12/2023    5:36 AM  Last 3 Weights  Weight (lbs) 190 lb 0.6 oz 171 lb 15.3 oz 190 lb 8 oz  Weight (kg) 86.2 kg 78 kg 86.41 kg      Telemetry    Sinus. - Personally Reviewed  Physical Exam    GEN: No acute distress.   Cardiac: RRR,III/VI systolic ejection murmur Respiratory: Clear to auscultation bilaterally. Psych: Normal affect   Assessment & Plan    #Severe AS #NICM Awaiting AVR next week.  Compensated this AM on exam. Replete K this AM.      Donelda Fujita T. Marven Slimmer, MD, [facility], [facility] Cardiac Electrophysiology

## 2023-09-17 NOTE — Plan of Care (Signed)

## 2023-09-18 ENCOUNTER — Inpatient Hospital Stay (HOSPITAL_COMMUNITY)

## 2023-09-18 DIAGNOSIS — I350 Nonrheumatic aortic (valve) stenosis: Secondary | ICD-10-CM | POA: Diagnosis not present

## 2023-09-18 LAB — BASIC METABOLIC PANEL WITH GFR
Anion gap: 9 (ref 5–15)
BUN: 40 mg/dL — ABNORMAL HIGH (ref 6–20)
CO2: 30 mmol/L (ref 22–32)
Calcium: 9 mg/dL (ref 8.9–10.3)
Chloride: 95 mmol/L — ABNORMAL LOW (ref 98–111)
Creatinine, Ser: 1.28 mg/dL — ABNORMAL HIGH (ref 0.61–1.24)
GFR, Estimated: >60 mL/min (ref >60)
Glucose, Bld: 156 mg/dL — ABNORMAL HIGH (ref 70–99)
Potassium: 4.1 mmol/L (ref 3.5–5.1)
Sodium: 134 mmol/L — ABNORMAL LOW (ref 135–145)

## 2023-09-18 LAB — GLUCOSE, CAPILLARY
Glucose-Capillary: 158 mg/dL — ABNORMAL HIGH (ref 70–99)
Glucose-Capillary: 291 mg/dL — ABNORMAL HIGH (ref 70–99)
Glucose-Capillary: 340 mg/dL — ABNORMAL HIGH (ref 70–99)
Glucose-Capillary: 239 mg/dL — ABNORMAL HIGH (ref 70–99)

## 2023-09-18 MED ORDER — GABAPENTIN 100 MG PO CAPS
200.0000 mg | ORAL_CAPSULE | Freq: Two times a day (BID) | ORAL | Status: DC
  Administered 2023-09-18 – 2023-09-28 (×19): 200 mg via ORAL
  Filled 2023-09-18 (×19): qty 2

## 2023-09-18 NOTE — Plan of Care (Signed)
   Problem: Health Behavior/Discharge Planning: Goal: Ability to manage health-related needs will improve Outcome: Progressing   Problem: Clinical Measurements: Goal: Ability to maintain clinical measurements within normal limits will improve Outcome: Progressing

## 2023-09-18 NOTE — Plan of Care (Signed)

## 2023-09-18 NOTE — Progress Notes (Signed)
   Rounding Note    Patient Name: Jonathon Bray Date of Encounter: 09/18/2023  [facility] Cardiologist: Olinda Bertrand, DO   Subjective   NAEO. In chair this AM.  Vital Signs    Vitals:   09/18/23 0010 09/18/23 0446 09/18/23 0447 09/18/23 0809  BP: 118/85 (!) 128/92  106/74  Pulse: (!) 101   100  Resp: 18 18  20   Temp: 97.9 F (36.6 C) 97.9 F (36.6 C)  98 F (36.7 C)  TempSrc: Oral Oral  Oral  SpO2: 91%   100%  Weight:   84.4 kg   Height:        Intake/Output Summary (Last 24 hours) at 09/18/2023 0936 Last data filed at 09/17/2023 1336 Gross per 24 hour  Intake 100 ml  Output --  Net 100 ml      09/18/2023    4:47 AM 09/17/2023    6:35 AM 09/16/2023    2:47 PM  Last 3 Weights  Weight (lbs) 186 lb 1.1 oz 190 lb 0.6 oz 171 lb 15.3 oz  Weight (kg) 84.4 kg 86.2 kg 78 kg      Telemetry    Sinus. - Personally Reviewed  Physical Exam    GEN: No acute distress.   Cardiac: RRR,III/VI systolic ejection murmur Respiratory: Clear to auscultation bilaterally. Psych: Normal affect   Assessment & Plan    #Severe AS #NICM Awaiting AVR next week.  Compensated this AM on exam.       Donelda Fujita T. Marven Slimmer, MD, [facility], [facility] Cardiac Electrophysiology

## 2023-09-18 NOTE — Progress Notes (Signed)
 Carotid duplex has been completed.   Results can be found under chart review under CV PROC. 09/18/2023 4:55 PM Mckale Haffey RVT, RDMS

## 2023-09-19 DIAGNOSIS — I350 Nonrheumatic aortic (valve) stenosis: Secondary | ICD-10-CM | POA: Diagnosis not present

## 2023-09-19 LAB — BASIC METABOLIC PANEL WITH GFR
Anion gap: 10 (ref 5–15)
BUN: 40 mg/dL — ABNORMAL HIGH (ref 6–20)
CO2: 27 mmol/L (ref 22–32)
Calcium: 8.9 mg/dL (ref 8.9–10.3)
Chloride: 98 mmol/L (ref 98–111)
Creatinine, Ser: 1.33 mg/dL — ABNORMAL HIGH (ref 0.61–1.24)
GFR, Estimated: >60 mL/min (ref >60)
Glucose, Bld: 168 mg/dL — ABNORMAL HIGH (ref 70–99)
Potassium: 3.9 mmol/L (ref 3.5–5.1)
Sodium: 135 mmol/L (ref 135–145)

## 2023-09-19 LAB — GLUCOSE, CAPILLARY
Glucose-Capillary: 185 mg/dL — ABNORMAL HIGH (ref 70–99)
Glucose-Capillary: 190 mg/dL — ABNORMAL HIGH (ref 70–99)
Glucose-Capillary: 317 mg/dL — ABNORMAL HIGH (ref 70–99)
Glucose-Capillary: 410 mg/dL — ABNORMAL HIGH (ref 70–99)

## 2023-09-19 MED ORDER — INSULIN ASPART 100 UNIT/ML IJ SOLN
0.0000 [IU] | Freq: Three times a day (TID) | INTRAMUSCULAR | Status: DC
  Administered 2023-09-20: 3 [IU] via SUBCUTANEOUS
  Administered 2023-09-20: 5 [IU] via SUBCUTANEOUS
  Administered 2023-09-21: 11 [IU] via SUBCUTANEOUS
  Administered 2023-09-21: 3 [IU] via SUBCUTANEOUS
  Administered 2023-09-21: 8 [IU] via SUBCUTANEOUS
  Administered 2023-09-22: 5 [IU] via SUBCUTANEOUS

## 2023-09-19 MED ORDER — INSULIN ASPART 100 UNIT/ML IJ SOLN
0.0000 [IU] | Freq: Every day | INTRAMUSCULAR | Status: DC
  Administered 2023-09-19: 5 [IU] via SUBCUTANEOUS
  Administered 2023-09-20: 3 [IU] via SUBCUTANEOUS
  Administered 2023-09-21: 4 [IU] via SUBCUTANEOUS

## 2023-09-19 MED ORDER — INSULIN GLARGINE-YFGN 100 UNIT/ML ~~LOC~~ SOLN
30.0000 [IU] | SUBCUTANEOUS | Status: DC
  Administered 2023-09-19 – 2023-09-21 (×3): 30 [IU] via SUBCUTANEOUS
  Filled 2023-09-19 (×4): qty 0.3

## 2023-09-19 NOTE — Progress Notes (Signed)
  Progress Note  Patient Name: Jonathon Bray Date of Encounter: 09/19/2023 [facility] Cardiologist: Olinda Bertrand, DO   Interval Summary   No chest pain or shortness of breath at rest.  He feels like he is smothering at night when he is supine.  No further lightheadedness or presyncope  Vital Signs Vitals:   09/18/23 2009 09/18/23 2312 09/19/23 0434 09/19/23 0747  BP: 114/85 108/77 100/75 113/79  Pulse: (!) 109 (!) 113 100 (!) 105  Resp: 17 18 19 19   Temp: 97.8 F (36.6 C) 97.7 F (36.5 C) 97.9 F (36.6 C) 97.9 F (36.6 C)  TempSrc: Oral Oral Oral Oral  SpO2: 96% 93% 95% 97%  Weight:   88 kg   Height:       No intake or output data in the 24 hours ending 09/19/23 0823    09/19/2023    4:34 AM 09/18/2023    4:47 AM 09/17/2023    6:35 AM  Last 3 Weights  Weight (lbs) 194 lb 0.1 oz 186 lb 1.1 oz 190 lb 0.6 oz  Weight (kg) 88 kg 84.4 kg 86.2 kg      Telemetry/ECG  Sinus tachycardia heart rate 110 bpm - Personally Reviewed  Physical Exam  GEN: No acute distress.   Neck: No JVD Cardiac: Tachycardic and regular with a 2/6 crescendo decrescendo murmur at the right upper sternal border and a widely split S2 Respiratory: Clear to auscultation bilaterally. GI: Soft, nontender, non-distended  MS: 1+ bilateral pretibial edema  Assessment & Plan  Severe, symptomatic aortic stenosis, Stage D2 with hypotension and near syncope. Appreciate cardiac surgery evaluation. Plans for surgery later this week.  Even with LVEF 20 to 25%, he is generating a mean transvalvular gradient of 31 mmHg and has a dimensionless index of 0.16, findings consistent with critical aortic stenosis. Hypotension: antihypertensives on hold in hospital. BP now in low normal range 100/75 mmHg last reading.  Continue to hold antihypertensives. Type II DM: blood glucose 158-340.  Will increase long-acting insulin  from 20 units to 30 units daily.  Continue sliding scale insulin  with meals. Chronic  systolic heart failure: Hold GDMT in the setting of severe aortic stenosis and hypotension.  Will reinitiate medical therapy following surgery.  Disposition: With critical aortic stenosis, documented hypotension, and near syncope with fall, he will remain hospitalized until cardiac surgery later this week.     For questions or updates, please contact [facility] Please consult [email or web address] for contact info under       Signed, Arnoldo Lapping, MD

## 2023-09-19 NOTE — Plan of Care (Signed)

## 2023-09-19 NOTE — Progress Notes (Signed)
 CARDIAC REHAB PHASE I      Pre-op OHS education including OHS booklet, OHS handout, IS use, mobility importance, home needs at discharge and sternal precautions/move in the tube reviewed. All questions and concerns addressed. Will continue to follow.   Ronny Colas, RN BSN 09/19/2023 12:06 PM

## 2023-09-19 NOTE — Plan of Care (Signed)
   Problem: Education: Goal: Knowledge of General Education information will improve Description: Including pain rating scale, medication(s)/side effects and non-pharmacologic comfort measures Outcome: Progressing   Problem: Activity: Goal: Risk for activity intolerance will decrease Outcome: Progressing

## 2023-09-20 ENCOUNTER — Ambulatory Visit: Admitting: Thoracic Surgery (Cardiothoracic Vascular Surgery)

## 2023-09-20 DIAGNOSIS — I350 Nonrheumatic aortic (valve) stenosis: Secondary | ICD-10-CM | POA: Diagnosis not present

## 2023-09-20 LAB — BASIC METABOLIC PANEL WITH GFR
Anion gap: 12 (ref 5–15)
BUN: 33 mg/dL — ABNORMAL HIGH (ref 6–20)
CO2: 26 mmol/L (ref 22–32)
Calcium: 8.9 mg/dL (ref 8.9–10.3)
Chloride: 99 mmol/L (ref 98–111)
Creatinine, Ser: 1.29 mg/dL — ABNORMAL HIGH (ref 0.61–1.24)
GFR, Estimated: >60 mL/min (ref >60)
Glucose, Bld: 98 mg/dL (ref 70–99)
Potassium: 4.2 mmol/L (ref 3.5–5.1)
Sodium: 137 mmol/L (ref 135–145)

## 2023-09-20 LAB — GLUCOSE, CAPILLARY
Glucose-Capillary: 121 mg/dL — ABNORMAL HIGH (ref 70–99)
Glucose-Capillary: 179 mg/dL — ABNORMAL HIGH (ref 70–99)
Glucose-Capillary: 208 mg/dL — ABNORMAL HIGH (ref 70–99)
Glucose-Capillary: 273 mg/dL — ABNORMAL HIGH (ref 70–99)

## 2023-09-20 LAB — MAGNESIUM: Magnesium: 2.1 mg/dL (ref 1.7–2.4)

## 2023-09-20 MED ORDER — FUROSEMIDE 10 MG/ML IJ SOLN
40.0000 mg | Freq: Once | INTRAMUSCULAR | Status: AC
  Administered 2023-09-20: 40 mg via INTRAVENOUS
  Filled 2023-09-20: qty 4

## 2023-09-20 NOTE — Progress Notes (Signed)
   Called to see patient regarding c/o shortness of breath. Review of chart with LVEF of 20%, planned for AVR on 6/19. Weights are up 171>>197lbs over the past several days, LE edema noted. Will order IV lasix  40mg  x1 now.   SignedJohnie Nailer, NP-C 09/20/2023, 2:03 PM Pager: [phone]

## 2023-09-20 NOTE — Plan of Care (Signed)

## 2023-09-20 NOTE — Progress Notes (Signed)
 Pt reports feeling SOB. NP paged. NP coming to bedside.

## 2023-09-20 NOTE — Progress Notes (Signed)
 Procedure(s) (LRB): REPLACEMENT, AORTIC VALVE, OPEN (N/A) ECHOCARDIOGRAM, TRANSESOPHAGEAL, INTRAOPERATIVE (N/A) Subjective: No complaints, doing better   Objective: Vital signs in last 24 hours: Temp:  [97.6 F (36.4 C)-98.2 F (36.8 C)] 97.7 F (36.5 C) (06/17 0751) Pulse Rate:  [79-106] 100 (06/17 0751) Cardiac Rhythm: Sinus tachycardia (06/16 2000) Resp:  [19-20] 19 (06/17 0751) BP: (102-112)/(75-84) 107/75 (06/17 0751) SpO2:  [94 %-98 %] 98 % (06/17 0622) Weight:  [89.6 kg] 89.6 kg (06/17 0622)  Hemodynamic parameters for last 24 hours:    Intake/Output from previous day: No intake/output data recorded. Intake/Output this shift: No intake/output data recorded.  General appearance: alert, cooperative, and no distress Neurologic: intact Heart: regular rate and rhythm and + murmur Lungs: clear to auscultation bilaterally  Lab Results: No results for input(s): WBC, HGB, HCT, PLT in the last 72 hours. BMET:  Recent Labs    09/19/23 0227 09/20/23 0257  NA 135 137  K 3.9 4.2  CL 98 99  CO2 27 26  GLUCOSE 168* 98  BUN 40* 33*  CREATININE 1.33* 1.29*  CALCIUM  8.9 8.9    PT/INR: No results for input(s): LABPROT, INR in the last 72 hours. ABG    Component Value Date/Time   PHART 7.264 (L) 06/16/2023 0913   HCO3 21.0 06/16/2023 0913   TCO2 22 06/16/2023 0913   ACIDBASEDEF 6.0 (H) 06/16/2023 0913   O2SAT 59 06/16/2023 0913   CBG (last 3)  Recent Labs    09/19/23 1614 09/19/23 2118 09/20/23 0626  GLUCAP 317* 410* 121*    Assessment/Plan: S/P Procedure(s) (LRB): REPLACEMENT, AORTIC VALVE, OPEN (N/A) ECHOCARDIOGRAM, TRANSESOPHAGEAL, INTRAOPERATIVE (N/A) Critical AS For AVR 6/19 He wishes to have a tissue valve to avoid need for lifelong anticoagulation He understands the possibility of need for redo procedure   LOS: 4 days    Jonathon Bray 09/20/2023

## 2023-09-20 NOTE — Progress Notes (Signed)
 Orthopedic Tech Progress Note Patient Details:  Jonathon Bray 17-Jul-1965 [id number]  Ortho Devices Type of Ortho Device: Radio broadcast assistant Ortho Device/Splint Location: Bilateral LE Ortho Device/Splint Interventions: Ordered, Application, Adjustment   Post Interventions Patient Tolerated: Well Instructions Provided: Adjustment of device  [name] 09/20/2023, 5:42 PM

## 2023-09-20 NOTE — Progress Notes (Signed)
  Progress Note  Patient Name: Jonathon Bray Date of Encounter: 09/20/2023 [facility] Cardiologist: Olinda Bertrand, DO   Interval Summary   No acute events overnight.  BP stable  Patient without cardiovascular complaints.  Vital Signs Vitals:   09/19/23 1954 09/19/23 2347 09/20/23 0622 09/20/23 0751  BP: 110/83 112/84 108/77 107/75  Pulse: (!) 106 (!) 102 79 100  Resp: 20  20 19   Temp: 97.9 F (36.6 C) 98.2 F (36.8 C) 97.6 F (36.4 C) 97.7 F (36.5 C)  TempSrc: Oral Axillary Axillary Oral  SpO2: 96% 94% 98%   Weight:   89.6 kg   Height:       No intake or output data in the 24 hours ending 09/20/23 0905    09/20/2023    6:22 AM 09/19/2023    4:34 AM 09/18/2023    4:47 AM  Last 3 Weights  Weight (lbs) 197 lb 8 oz 194 lb 0.1 oz 186 lb 1.1 oz  Weight (kg) 89.585 kg 88 kg 84.4 kg      Telemetry/ECG  Sinus tachycardia, occ SVT - Personally Reviewed  Physical Exam  GEN: No acute distress.   Neck: No JVD Cardiac: Tachycardic, 2/6 SEM Respiratory: Clear to auscultation bilaterally. GI: Soft, nontender, non-distended  MS: Trace edema  Assessment & Plan  Severe, symptomatic aortic stenosis, Stage D2 with hypotension and near syncope. AVR scheduled for 6/19 Hypotension: antihypertensives on hold in hospital. BP now in low normal range 100/75 mmHg last reading.  Continue to hold antihypertensives. Type II DM: Continue current therapy Chronic systolic heart failure: Will optimize regimen after AVR. SVT:  Asymptomatic.  Monitor for now.  K WNL, check Mag today.  Defer BB given severe AS, low normal BP  Disposition: Likely next week after AVR     For questions or updates, please contact [facility] Please consult [email or web address] for contact info under       Signed, Tailey Top K Reymond Maynez, MD

## 2023-09-21 ENCOUNTER — Inpatient Hospital Stay (HOSPITAL_COMMUNITY)

## 2023-09-21 ENCOUNTER — Telehealth (HOSPITAL_COMMUNITY): Payer: Self-pay | Admitting: Pharmacy Technician

## 2023-09-21 ENCOUNTER — Other Ambulatory Visit (HOSPITAL_COMMUNITY): Payer: Self-pay

## 2023-09-21 DIAGNOSIS — I350 Nonrheumatic aortic (valve) stenosis: Secondary | ICD-10-CM | POA: Diagnosis not present

## 2023-09-21 LAB — GLUCOSE, CAPILLARY
Glucose-Capillary: 168 mg/dL — ABNORMAL HIGH (ref 70–99)
Glucose-Capillary: 307 mg/dL — ABNORMAL HIGH (ref 70–99)
Glucose-Capillary: 275 mg/dL — ABNORMAL HIGH (ref 70–99)
Glucose-Capillary: 319 mg/dL — ABNORMAL HIGH (ref 70–99)

## 2023-09-21 LAB — BASIC METABOLIC PANEL WITH GFR
Anion gap: 7 (ref 5–15)
BUN: 35 mg/dL — ABNORMAL HIGH (ref 6–20)
CO2: 29 mmol/L (ref 22–32)
Calcium: 8.8 mg/dL — ABNORMAL LOW (ref 8.9–10.3)
Chloride: 98 mmol/L (ref 98–111)
Creatinine, Ser: 1.31 mg/dL — ABNORMAL HIGH (ref 0.61–1.24)
GFR, Estimated: >60 mL/min (ref >60)
Glucose, Bld: 160 mg/dL — ABNORMAL HIGH (ref 70–99)
Potassium: 3.9 mmol/L (ref 3.5–5.1)
Sodium: 134 mmol/L — ABNORMAL LOW (ref 135–145)

## 2023-09-21 LAB — TYPE AND SCREEN
ABO/RH(D): A NEG
Antibody Screen: NEGATIVE

## 2023-09-21 MED ORDER — DEXMEDETOMIDINE HCL IN NACL 400 MCG/100ML IV SOLN
0.1000 ug/kg/h | INTRAVENOUS | Status: AC
  Administered 2023-09-22: .7 ug/kg/h via INTRAVENOUS
  Filled 2023-09-21: qty 100

## 2023-09-21 MED ORDER — MORPHINE SULFATE (PF) 2 MG/ML IV SOLN
2.0000 mg | Freq: Once | INTRAVENOUS | Status: AC
  Administered 2023-09-21: 2 mg via INTRAVENOUS
  Filled 2023-09-21: qty 1

## 2023-09-21 MED ORDER — NOREPINEPHRINE 4 MG/250ML-% IV SOLN
0.0000 ug/min | INTRAVENOUS | Status: AC
  Administered 2023-09-22: 2 ug/min via INTRAVENOUS
  Filled 2023-09-21: qty 250

## 2023-09-21 MED ORDER — POTASSIUM CHLORIDE CRYS ER 20 MEQ PO TBCR
40.0000 meq | EXTENDED_RELEASE_TABLET | Freq: Once | ORAL | Status: AC
  Administered 2023-09-21: 40 meq via ORAL
  Filled 2023-09-21: qty 2

## 2023-09-21 MED ORDER — PLASMA-LYTE A IV SOLN
INTRAVENOUS | Status: DC
  Filled 2023-09-21: qty 2.5

## 2023-09-21 MED ORDER — CHLORHEXIDINE GLUCONATE 0.12 % MT SOLN
15.0000 mL | Freq: Once | OROMUCOSAL | Status: AC
  Administered 2023-09-22: 15 mL via OROMUCOSAL

## 2023-09-21 MED ORDER — CEFAZOLIN SODIUM-DEXTROSE 2-4 GM/100ML-% IV SOLN
2.0000 g | INTRAVENOUS | Status: AC
  Administered 2023-09-22: 2 g via INTRAVENOUS
  Filled 2023-09-21: qty 100

## 2023-09-21 MED ORDER — ATORVASTATIN CALCIUM 40 MG PO TABS
40.0000 mg | ORAL_TABLET | Freq: Every day | ORAL | Status: DC
  Administered 2023-09-21 – 2023-09-28 (×7): 40 mg via ORAL
  Filled 2023-09-21 (×7): qty 1

## 2023-09-21 MED ORDER — HEPARIN 30,000 UNITS/1000 ML (OHS) CELLSAVER SOLUTION
Status: DC
  Filled 2023-09-21: qty 1000

## 2023-09-21 MED ORDER — INSULIN REGULAR(HUMAN) IN NACL 100-0.9 UT/100ML-% IV SOLN
INTRAVENOUS | Status: AC
  Administered 2023-09-22: 19 [IU]/h via INTRAVENOUS
  Administered 2023-09-22: 15 [IU]/h via INTRAVENOUS
  Filled 2023-09-21: qty 100

## 2023-09-21 MED ORDER — PHENYLEPHRINE HCL-NACL 20-0.9 MG/250ML-% IV SOLN
30.0000 ug/min | INTRAVENOUS | Status: AC
  Administered 2023-09-22: 25 ug/min via INTRAVENOUS
  Filled 2023-09-21: qty 250

## 2023-09-21 MED ORDER — MANNITOL 20 % IV SOLN
INTRAVENOUS | Status: DC
  Filled 2023-09-21: qty 13

## 2023-09-21 MED ORDER — CHLORHEXIDINE GLUCONATE CLOTH 2 % EX PADS
6.0000 | MEDICATED_PAD | Freq: Once | CUTANEOUS | Status: AC
  Administered 2023-09-22: 6 via TOPICAL

## 2023-09-21 MED ORDER — TRANEXAMIC ACID (OHS) PUMP PRIME SOLUTION
2.0000 mg/kg | INTRAVENOUS | Status: DC
  Filled 2023-09-21: qty 1.79

## 2023-09-21 MED ORDER — TRANEXAMIC ACID 1000 MG/10ML IV SOLN
1.5000 mg/kg/h | INTRAVENOUS | Status: AC
  Administered 2023-09-22: 1.5 mg/kg/h via INTRAVENOUS
  Filled 2023-09-21: qty 25

## 2023-09-21 MED ORDER — NITROGLYCERIN IN D5W 200-5 MCG/ML-% IV SOLN
2.0000 ug/min | INTRAVENOUS | Status: DC
  Filled 2023-09-21: qty 250

## 2023-09-21 MED ORDER — EPINEPHRINE HCL 5 MG/250ML IV SOLN IN NS
0.0000 ug/min | INTRAVENOUS | Status: AC
  Administered 2023-09-22: 2 ug/min via INTRAVENOUS
  Filled 2023-09-21: qty 250

## 2023-09-21 MED ORDER — BISACODYL 5 MG PO TBEC
5.0000 mg | DELAYED_RELEASE_TABLET | Freq: Once | ORAL | Status: AC
  Administered 2023-09-21: 5 mg via ORAL
  Filled 2023-09-21: qty 1

## 2023-09-21 MED ORDER — VANCOMYCIN HCL 1.5 G IV SOLR
1500.0000 mg | INTRAVENOUS | Status: AC
  Administered 2023-09-22: 1500 mg via INTRAVENOUS
  Filled 2023-09-21: qty 30

## 2023-09-21 MED ORDER — CHLORHEXIDINE GLUCONATE CLOTH 2 % EX PADS
6.0000 | MEDICATED_PAD | Freq: Once | CUTANEOUS | Status: AC
  Administered 2023-09-21: 6 via TOPICAL

## 2023-09-21 MED ORDER — ALPRAZOLAM 0.25 MG PO TABS
0.2500 mg | ORAL_TABLET | Freq: Three times a day (TID) | ORAL | Status: DC | PRN
  Administered 2023-09-21 – 2023-09-22 (×2): 0.25 mg via ORAL
  Filled 2023-09-21 (×2): qty 1

## 2023-09-21 MED ORDER — METOPROLOL TARTRATE 12.5 MG HALF TABLET
12.5000 mg | ORAL_TABLET | Freq: Once | ORAL | Status: AC
  Administered 2023-09-22: 12.5 mg via ORAL
  Filled 2023-09-21: qty 1

## 2023-09-21 MED ORDER — POTASSIUM CHLORIDE 2 MEQ/ML IV SOLN
80.0000 meq | INTRAVENOUS | Status: DC
  Filled 2023-09-21: qty 40

## 2023-09-21 MED ORDER — MILRINONE LACTATE IN DEXTROSE 20-5 MG/100ML-% IV SOLN
0.3000 ug/kg/min | INTRAVENOUS | Status: AC
  Administered 2023-09-22: .25 ug/kg/min via INTRAVENOUS
  Filled 2023-09-21: qty 100

## 2023-09-21 MED ORDER — TRANEXAMIC ACID (OHS) BOLUS VIA INFUSION
15.0000 mg/kg | INTRAVENOUS | Status: AC
  Administered 2023-09-22: 1344 mg via INTRAVENOUS
  Filled 2023-09-21: qty 1344

## 2023-09-21 NOTE — Progress Notes (Signed)
 Mobility Specialist Progress Note;   09/21/23 0954  Mobility  Activity Ambulated with assistance in hallway  Level of Assistance Contact guard assist, steadying assist  Assistive Device Front wheel walker  Distance Ambulated (ft) 100 ft  Activity Response Tolerated well  Mobility Referral Yes  Mobility visit 1 Mobility  Mobility Specialist Start Time (ACUTE ONLY) 0954  Mobility Specialist Stop Time (ACUTE ONLY) 1002  Mobility Specialist Time Calculation (min) (ACUTE ONLY) 8 min   Pt agreeable to mobility. Required light MinG assistance during ambulation for safety. HR up to 120 bpm w/ activity. C/o L sided pain, otherwise asx. Pt returned back to chair with all needs met, call bell in reach. Wife present.  Janit Meline Mobility Specialist Please contact via SecureChat or [employer] [phone]

## 2023-09-21 NOTE — Anesthesia Preprocedure Evaluation (Addendum)
 Anesthesia Evaluation  Patient identified by MRN, date of birth, ID band Patient awake    Reviewed: Allergy & Precautions, NPO status , Patient's Chart, lab work & pertinent test results, reviewed documented beta blocker date and time   History of Anesthesia Complications (+) PONV  Airway Mallampati: II  TM Distance: >3 FB Neck ROM: Full    Dental  (+) Dental Advisory Given   Pulmonary neg pulmonary ROS   breath sounds clear to auscultation       Cardiovascular hypertension, Pt. on medications and Pt. on home beta blockers (-) CAD  Rhythm:Regular Rate:Normal  ECHO: EF 20-25%, severely decreased LVF, global hypokinesis, mild LVH, RVF normal, trivial MR, severe calcification of th esortic valve with mild AI, severe AS with low flow gradient mean 31 mmHg, peak grad 40.7 mmHg, AVA 0.4 cm2 (VTI)   Neuro/Psych negative neurological ROS     GI/Hepatic negative GI ROS, Neg liver ROS,,,  Endo/Other  diabetes (glu 248), Insulin  Dependent  Ozempic BMI 33  Renal/GU negative Renal ROS     Musculoskeletal   Abdominal   Peds  Hematology Hb 14.3, plt 301k   Anesthesia Other Findings   Reproductive/Obstetrics                              Anesthesia Physical Anesthesia Plan  ASA: 4  Anesthesia Plan: General   Post-op Pain Management:    Induction: Intravenous  PONV Risk Score and Plan: 3 and Treatment may vary due to age or medical condition  Airway Management Planned: Oral ETT  Additional Equipment: Arterial line, PA Cath, TEE and Ultrasound Guidance Line Placement  Intra-op Plan:   Post-operative Plan: Post-operative intubation/ventilation  Informed Consent: I have reviewed the patients History and Physical, chart, labs and discussed the procedure including the risks, benefits and alternatives for the proposed anesthesia with the patient or authorized representative who has indicated his/her  understanding and acceptance.     Dental advisory given  Plan Discussed with: CRNA and Surgeon  Anesthesia Plan Comments:         Anesthesia Quick Evaluation

## 2023-09-21 NOTE — Progress Notes (Addendum)
  Progress Note  Patient Name: Jonathon Bray Date of Encounter: 09/21/2023 [facility] Cardiologist: Olinda Bertrand, DO   Interval Summary    Called by RN, patient complains of left sided cramping sensation in his chest and arm. Abrupt onset this morning. Slept sitting up in the chair last evening.   Vital Signs Vitals:   09/20/23 1949 09/20/23 2311 09/21/23 0620 09/21/23 0749  BP: 110/87 111/87 111/87 (!) 119/97  Pulse: (!) 107 (!) 104 (!) 101 (!) 110  Resp: 20 20 20    Temp: 98.8 F (37.1 C) 98.4 F (36.9 C) (!) 97.4 F (36.3 C) 97.9 F (36.6 C)  TempSrc: Oral Oral Oral Oral  SpO2: 98% 98% 98%   Weight:   89.6 kg   Height:        Intake/Output Summary (Last 24 hours) at 09/21/2023 0818 Last data filed at 09/21/2023 0752 Gross per 24 hour  Intake 360 ml  Output 450 ml  Net -90 ml      09/21/2023    6:20 AM 09/20/2023    6:22 AM 09/19/2023    4:34 AM  Last 3 Weights  Weight (lbs) 197 lb 9.6 oz 197 lb 8 oz 194 lb 0.1 oz  Weight (kg) 89.631 kg 89.585 kg 88 kg      Telemetry/ECG   Sinus Tachycardia - Personally Reviewed  Physical Exam  GEN: No acute distress.   Neck: No JVD Cardiac: tachycardic, + 2/6 systolic murmur, no rubs, or gallops.  Respiratory: Clear to auscultation bilaterally. GI: Soft, nontender, non-distended  MS: Unna boots   Assessment & Plan   57 year old male with severe aortic stenosis, HFrEF, hyperlipidemia, diabetes who was transferred from outside hospital on 6/13 with episode of hypotension and presyncope.  Severe aortic stenosis Hypotension Near syncope -- Presented from outside hospital with known diagnosis of severe aortic stenosis with hypotension and near syncope.  Evaluated by CT surgery with plans for AVR 6/19  Acute HFrEF -- LVEF 20 to 25%, global hypokinesis, normal RV.  -- Status post IV Lasix  40 mg x 1 yesterday in the setting of volume overload -- Reports decent urine output though not documented and I's and O's  this morning, weight remains 197lbs. Reports breathing has improved -- suppl K+, 40meq x1 -- will need to adjust to GDMT post op   Chest pain -- EKG this morning without ischemic changes, seems more MSK in nature -- morphine 2mg  x1  DM -- Hgb A1c 9 -- on SSI, semglee 30u  -- will need SGLT2i post op  HLD -- resume atorvastatin  40mg  daily    For questions or updates, please contact [facility] Please consult [email or web address] for contact info under       Signed, Johnie Nailer, NP   ATTENDING ATTESTATION:  After conducting a review of all available clinical information with the care team, interviewing the patient, and performing a physical exam, I agree with the findings and plan described in this note.   GEN: No acute distress.   HEENT:  MMM, no JVD, no scleral icterus Cardiac: RRR, 2/6 SEM Respiratory: Clear to auscultation bilaterally. GI: Soft, nontender, non-distended  MS: No edema; No deformity. Neuro:  Nonfocal  Vasc:  +2 radial pulses  Patient with complaints of chest pain.  Patient tender to palpation of L upper chest.  No other acute events.  Continue current therapy.  AVR in AM.  Alyssa Backbone, MD Pager [phone]

## 2023-09-21 NOTE — Plan of Care (Signed)

## 2023-09-21 NOTE — Telephone Encounter (Signed)
 Patient Product/process development scientist completed.    The patient is insured through [employer]. Patient has [employer], may use a copay card, and/or apply for patient assistance if available.    Ran test claim for Farixga 10 mg and the current 30 day co-pay is $0.00.  Ran test claim for Jardiance 10 mg and the current 30 day co-pay is $0.00.  This test claim was processed through [facility]- copay amounts may vary at other pharmacies due to pharmacy/plan contracts, or as the patient moves through the different stages of their insurance plan.     Morgan Arab, CPHT Pharmacy Technician III Certified Patient Advocate [facility] Patient Advocate Team Direct Number: [phone]  Fax: [phone]

## 2023-09-21 NOTE — Plan of Care (Signed)
  Problem: Education: Goal: Knowledge of General Education information will improve Description: Including pain rating scale, medication(s)/side effects and non-pharmacologic comfort measures Outcome: Progressing   Problem: Health Behavior/Discharge Planning: Goal: Ability to manage health-related needs will improve Outcome: Progressing   Problem: Clinical Measurements: Goal: Ability to maintain clinical measurements within normal limits will improve Outcome: Progressing   Problem: Nutrition: Goal: Adequate nutrition will be maintained Outcome: Progressing   Problem: Elimination: Goal: Will not experience complications related to bowel motility Outcome: Progressing   Problem: Pain Managment: Goal: General experience of comfort will improve and/or be controlled Outcome: Progressing

## 2023-09-22 ENCOUNTER — Inpatient Hospital Stay (HOSPITAL_COMMUNITY)

## 2023-09-22 ENCOUNTER — Other Ambulatory Visit: Payer: Self-pay

## 2023-09-22 ENCOUNTER — Encounter (HOSPITAL_COMMUNITY)
Admission: AD | Disposition: A | Discharge status: Home / Self Care | Payer: Self-pay | Source: Other Acute Inpatient Hospital | Attending: Thoracic Surgery (Cardiothoracic Vascular Surgery)

## 2023-09-22 ENCOUNTER — Encounter (HOSPITAL_COMMUNITY): Payer: Self-pay | Admitting: Cardiology

## 2023-09-22 ENCOUNTER — Inpatient Hospital Stay (HOSPITAL_COMMUNITY): Admitting: Certified Registered Nurse Anesthetist

## 2023-09-22 DIAGNOSIS — I10 Essential (primary) hypertension: Secondary | ICD-10-CM | POA: Diagnosis not present

## 2023-09-22 DIAGNOSIS — Z794 Long term (current) use of insulin: Secondary | ICD-10-CM

## 2023-09-22 DIAGNOSIS — I350 Nonrheumatic aortic (valve) stenosis: Secondary | ICD-10-CM

## 2023-09-22 DIAGNOSIS — E119 Type 2 diabetes mellitus without complications: Secondary | ICD-10-CM | POA: Diagnosis not present

## 2023-09-22 DIAGNOSIS — Z953 Presence of xenogenic heart valve: Secondary | ICD-10-CM

## 2023-09-22 HISTORY — PX: AORTIC VALVE REPLACEMENT: SHX41

## 2023-09-22 HISTORY — PX: INTRAOPERATIVE TRANSESOPHAGEAL ECHOCARDIOGRAM: SHX5062

## 2023-09-22 LAB — GLUCOSE, CAPILLARY
Glucose-Capillary: 248 mg/dL — ABNORMAL HIGH (ref 70–99)
Glucose-Capillary: 238 mg/dL — ABNORMAL HIGH (ref 70–99)
Glucose-Capillary: 217 mg/dL — ABNORMAL HIGH (ref 70–99)
Glucose-Capillary: 219 mg/dL — ABNORMAL HIGH (ref 70–99)
Glucose-Capillary: 200 mg/dL — ABNORMAL HIGH (ref 70–99)
Glucose-Capillary: 194 mg/dL — ABNORMAL HIGH (ref 70–99)
Glucose-Capillary: 178 mg/dL — ABNORMAL HIGH (ref 70–99)
Glucose-Capillary: 178 mg/dL — ABNORMAL HIGH (ref 70–99)
Glucose-Capillary: 178 mg/dL — ABNORMAL HIGH (ref 70–99)
Glucose-Capillary: 166 mg/dL — ABNORMAL HIGH (ref 70–99)
Glucose-Capillary: 175 mg/dL — ABNORMAL HIGH (ref 70–99)

## 2023-09-22 LAB — POCT I-STAT 7, (LYTES, BLD GAS, ICA,H+H)
Acid-base deficit: 4 mmol/L — ABNORMAL HIGH (ref 0.0–2.0)
Acid-base deficit: 3 mmol/L — ABNORMAL HIGH (ref 0.0–2.0)
Acid-base deficit: 4 mmol/L — ABNORMAL HIGH (ref 0.0–2.0)
Acid-base deficit: 5 mmol/L — ABNORMAL HIGH (ref 0.0–2.0)
Acid-base deficit: 7 mmol/L — ABNORMAL HIGH (ref 0.0–2.0)
Acid-base deficit: 9 mmol/L — ABNORMAL HIGH (ref 0.0–2.0)
Acid-base deficit: 7 mmol/L — ABNORMAL HIGH (ref 0.0–2.0)
Acid-base deficit: 7 mmol/L — ABNORMAL HIGH (ref 0.0–2.0)
Bicarbonate: 23.5 mmol/L (ref 20.0–28.0)
Bicarbonate: 22.6 mmol/L (ref 20.0–28.0)
Bicarbonate: 20.9 mmol/L (ref 20.0–28.0)
Bicarbonate: 21 mmol/L (ref 20.0–28.0)
Bicarbonate: 20.2 mmol/L (ref 20.0–28.0)
Bicarbonate: 17.8 mmol/L — ABNORMAL LOW (ref 20.0–28.0)
Bicarbonate: 19.5 mmol/L — ABNORMAL LOW (ref 20.0–28.0)
Bicarbonate: 20.2 mmol/L (ref 20.0–28.0)
Calcium, Ion: 1.25 mmol/L (ref 1.15–1.40)
Calcium, Ion: 1.04 mmol/L — ABNORMAL LOW (ref 1.15–1.40)
Calcium, Ion: 1.11 mmol/L — ABNORMAL LOW (ref 1.15–1.40)
Calcium, Ion: 1.27 mmol/L (ref 1.15–1.40)
Calcium, Ion: 1.24 mmol/L (ref 1.15–1.40)
Calcium, Ion: 1.24 mmol/L (ref 1.15–1.40)
Calcium, Ion: 1.2 mmol/L (ref 1.15–1.40)
Calcium, Ion: 1.26 mmol/L (ref 1.15–1.40)
HCT: 42 % (ref 39.0–52.0)
HCT: 26 % — ABNORMAL LOW (ref 39.0–52.0)
HCT: 26 % — ABNORMAL LOW (ref 39.0–52.0)
HCT: 26 % — ABNORMAL LOW (ref 39.0–52.0)
HCT: 35 % — ABNORMAL LOW (ref 39.0–52.0)
HCT: 35 % — ABNORMAL LOW (ref 39.0–52.0)
HCT: 33 % — ABNORMAL LOW (ref 39.0–52.0)
HCT: 37 % — ABNORMAL LOW (ref 39.0–52.0)
Hemoglobin: 14.3 g/dL (ref 13.0–17.0)
Hemoglobin: 8.8 g/dL — ABNORMAL LOW (ref 13.0–17.0)
Hemoglobin: 8.8 g/dL — ABNORMAL LOW (ref 13.0–17.0)
Hemoglobin: 8.8 g/dL — ABNORMAL LOW (ref 13.0–17.0)
Hemoglobin: 11.9 g/dL — ABNORMAL LOW (ref 13.0–17.0)
Hemoglobin: 11.9 g/dL — ABNORMAL LOW (ref 13.0–17.0)
Hemoglobin: 11.2 g/dL — ABNORMAL LOW (ref 13.0–17.0)
Hemoglobin: 12.6 g/dL — ABNORMAL LOW (ref 13.0–17.0)
O2 Saturation: 100 %
O2 Saturation: 100 %
O2 Saturation: 100 %
O2 Saturation: 99 %
O2 Saturation: 94 %
O2 Saturation: 95 %
O2 Saturation: 95 %
O2 Saturation: 91 %
Patient temperature: 36.1
Patient temperature: 36.3
Patient temperature: 36.3
Patient temperature: 36.3
Potassium: 5.4 mmol/L — ABNORMAL HIGH (ref 3.5–5.1)
Potassium: 3.6 mmol/L (ref 3.5–5.1)
Potassium: 4.9 mmol/L (ref 3.5–5.1)
Potassium: 3.7 mmol/L (ref 3.5–5.1)
Potassium: 3.5 mmol/L (ref 3.5–5.1)
Potassium: 3.4 mmol/L — ABNORMAL LOW (ref 3.5–5.1)
Potassium: 3.3 mmol/L — ABNORMAL LOW (ref 3.5–5.1)
Potassium: 3.4 mmol/L — ABNORMAL LOW (ref 3.5–5.1)
Sodium: 130 mmol/L — ABNORMAL LOW (ref 135–145)
Sodium: 132 mmol/L — ABNORMAL LOW (ref 135–145)
Sodium: 131 mmol/L — ABNORMAL LOW (ref 135–145)
Sodium: 134 mmol/L — ABNORMAL LOW (ref 135–145)
Sodium: 135 mmol/L (ref 135–145)
Sodium: 135 mmol/L (ref 135–145)
Sodium: 137 mmol/L (ref 135–145)
Sodium: 136 mmol/L (ref 135–145)
TCO2: 25 mmol/L (ref 22–32)
TCO2: 24 mmol/L (ref 22–32)
TCO2: 22 mmol/L (ref 22–32)
TCO2: 22 mmol/L (ref 22–32)
TCO2: 22 mmol/L (ref 22–32)
TCO2: 19 mmol/L — ABNORMAL LOW (ref 22–32)
TCO2: 21 mmol/L — ABNORMAL LOW (ref 22–32)
TCO2: 22 mmol/L (ref 22–32)
pCO2 arterial: 51.1 mmHg — ABNORMAL HIGH (ref 32–48)
pCO2 arterial: 43.9 mmHg (ref 32–48)
pCO2 arterial: 35.4 mmHg (ref 32–48)
pCO2 arterial: 41.4 mmHg (ref 32–48)
pCO2 arterial: 45.5 mmHg (ref 32–48)
pCO2 arterial: 39.9 mmHg (ref 32–48)
pCO2 arterial: 38.9 mmHg (ref 32–48)
pCO2 arterial: 44.7 mmHg (ref 32–48)
pH, Arterial: 7.27 — ABNORMAL LOW (ref 7.35–7.45)
pH, Arterial: 7.32 — ABNORMAL LOW (ref 7.35–7.45)
pH, Arterial: 7.379 (ref 7.35–7.45)
pH, Arterial: 7.313 — ABNORMAL LOW (ref 7.35–7.45)
pH, Arterial: 7.249 — ABNORMAL LOW (ref 7.35–7.45)
pH, Arterial: 7.253 — ABNORMAL LOW (ref 7.35–7.45)
pH, Arterial: 7.304 — ABNORMAL LOW (ref 7.35–7.45)
pH, Arterial: 7.26 — ABNORMAL LOW (ref 7.35–7.45)
pO2, Arterial: 338 mmHg — ABNORMAL HIGH (ref 83–108)
pO2, Arterial: 356 mmHg — ABNORMAL HIGH (ref 83–108)
pO2, Arterial: 307 mmHg — ABNORMAL HIGH (ref 83–108)
pO2, Arterial: 180 mmHg — ABNORMAL HIGH (ref 83–108)
pO2, Arterial: 80 mmHg — ABNORMAL LOW (ref 83–108)
pO2, Arterial: 84 mmHg (ref 83–108)
pO2, Arterial: 81 mmHg — ABNORMAL LOW (ref 83–108)
pO2, Arterial: 67 mmHg — ABNORMAL LOW (ref 83–108)

## 2023-09-22 LAB — HEMOGLOBIN A1C
Hgb A1c MFr Bld: 8.3 % — ABNORMAL HIGH (ref 4.8–5.6)
Mean Plasma Glucose: 191.51 mg/dL

## 2023-09-22 LAB — CBC
HCT: 36 % — ABNORMAL LOW (ref 39.0–52.0)
HCT: 37.8 % — ABNORMAL LOW (ref 39.0–52.0)
HCT: 40.8 % (ref 39.0–52.0)
Hemoglobin: 12.5 g/dL — ABNORMAL LOW (ref 13.0–17.0)
Hemoglobin: 13.3 g/dL (ref 13.0–17.0)
Hemoglobin: 14.3 g/dL (ref 13.0–17.0)
MCH: 29.1 pg (ref 26.0–34.0)
MCH: 29.3 pg (ref 26.0–34.0)
MCH: 29.3 pg (ref 26.0–34.0)
MCHC: 34.7 g/dL (ref 30.0–36.0)
MCHC: 35 g/dL (ref 30.0–36.0)
MCHC: 35.2 g/dL (ref 30.0–36.0)
MCV: 82.9 fL (ref 80.0–100.0)
MCV: 83.3 fL (ref 80.0–100.0)
MCV: 84.5 fL (ref 80.0–100.0)
Platelets: 218 10*3/uL (ref 150–400)
Platelets: 239 10*3/uL (ref 150–400)
Platelets: 301 10*3/uL (ref 150–400)
RBC: 4.26 MIL/uL (ref 4.22–5.81)
RBC: 4.54 MIL/uL (ref 4.22–5.81)
RBC: 4.92 MIL/uL (ref 4.22–5.81)
RDW: 12.6 % (ref 11.5–15.5)
RDW: 12.6 % (ref 11.5–15.5)
RDW: 12.6 % (ref 11.5–15.5)
WBC: 21.5 10*3/uL — ABNORMAL HIGH (ref 4.0–10.5)
WBC: 44.2 10*3/uL — ABNORMAL HIGH (ref 4.0–10.5)
WBC: 46.8 10*3/uL — ABNORMAL HIGH (ref 4.0–10.5)
nRBC: 0 % (ref 0.0–0.2)
nRBC: 0 % (ref 0.0–0.2)
nRBC: 0 % (ref 0.0–0.2)

## 2023-09-22 LAB — PROTIME-INR
INR: 1.1 (ref 0.8–1.2)
INR: 1.7 — ABNORMAL HIGH (ref 0.8–1.2)
Prothrombin Time: 14.9 s (ref 11.4–15.2)
Prothrombin Time: 20.4 s — ABNORMAL HIGH (ref 11.4–15.2)

## 2023-09-22 LAB — BLOOD GAS, ARTERIAL
Acid-Base Excess: 0 mmol/L (ref 0.0–2.0)
Bicarbonate: 24.8 mmol/L (ref 20.0–28.0)
Drawn by: 51147
O2 Saturation: 97.3 %
Patient temperature: 36.6
pCO2 arterial: 39 mmHg (ref 32–48)
pH, Arterial: 7.41 (ref 7.35–7.45)
pO2, Arterial: 77 mmHg — ABNORMAL LOW (ref 83–108)

## 2023-09-22 LAB — POCT I-STAT, CHEM 8
BUN: 38 mg/dL — ABNORMAL HIGH (ref 6–20)
BUN: 42 mg/dL — ABNORMAL HIGH (ref 6–20)
BUN: 34 mg/dL — ABNORMAL HIGH (ref 6–20)
BUN: 33 mg/dL — ABNORMAL HIGH (ref 6–20)
Calcium, Ion: 1.22 mmol/L (ref 1.15–1.40)
Calcium, Ion: 1.25 mmol/L (ref 1.15–1.40)
Calcium, Ion: 1.1 mmol/L — ABNORMAL LOW (ref 1.15–1.40)
Calcium, Ion: 1.27 mmol/L (ref 1.15–1.40)
Chloride: 96 mmol/L — ABNORMAL LOW (ref 98–111)
Chloride: 96 mmol/L — ABNORMAL LOW (ref 98–111)
Chloride: 97 mmol/L — ABNORMAL LOW (ref 98–111)
Chloride: 96 mmol/L — ABNORMAL LOW (ref 98–111)
Creatinine, Ser: 1.3 mg/dL — ABNORMAL HIGH (ref 0.61–1.24)
Creatinine, Ser: 1.4 mg/dL — ABNORMAL HIGH (ref 0.61–1.24)
Creatinine, Ser: 1.2 mg/dL (ref 0.61–1.24)
Creatinine, Ser: 1.1 mg/dL (ref 0.61–1.24)
Glucose, Bld: 309 mg/dL — ABNORMAL HIGH (ref 70–99)
Glucose, Bld: 320 mg/dL — ABNORMAL HIGH (ref 70–99)
Glucose, Bld: 270 mg/dL — ABNORMAL HIGH (ref 70–99)
Glucose, Bld: 256 mg/dL — ABNORMAL HIGH (ref 70–99)
HCT: 40 % (ref 39.0–52.0)
HCT: 45 % (ref 39.0–52.0)
HCT: 26 % — ABNORMAL LOW (ref 39.0–52.0)
HCT: 27 % — ABNORMAL LOW (ref 39.0–52.0)
Hemoglobin: 13.6 g/dL (ref 13.0–17.0)
Hemoglobin: 15.3 g/dL (ref 13.0–17.0)
Hemoglobin: 8.8 g/dL — ABNORMAL LOW (ref 13.0–17.0)
Hemoglobin: 9.2 g/dL — ABNORMAL LOW (ref 13.0–17.0)
Potassium: 4.1 mmol/L (ref 3.5–5.1)
Potassium: 5.5 mmol/L — ABNORMAL HIGH (ref 3.5–5.1)
Potassium: 3.8 mmol/L (ref 3.5–5.1)
Potassium: 3.7 mmol/L (ref 3.5–5.1)
Sodium: 130 mmol/L — ABNORMAL LOW (ref 135–145)
Sodium: 131 mmol/L — ABNORMAL LOW (ref 135–145)
Sodium: 132 mmol/L — ABNORMAL LOW (ref 135–145)
Sodium: 134 mmol/L — ABNORMAL LOW (ref 135–145)
TCO2: 24 mmol/L (ref 22–32)
TCO2: 25 mmol/L (ref 22–32)
TCO2: 25 mmol/L (ref 22–32)
TCO2: 21 mmol/L — ABNORMAL LOW (ref 22–32)

## 2023-09-22 LAB — APTT
aPTT: 33 s (ref 24–36)
aPTT: 43 s — ABNORMAL HIGH (ref 24–36)

## 2023-09-22 LAB — POCT I-STAT EG7
Acid-base deficit: 3 mmol/L — ABNORMAL HIGH (ref 0.0–2.0)
Bicarbonate: 23.8 mmol/L (ref 20.0–28.0)
Calcium, Ion: 1.11 mmol/L — ABNORMAL LOW (ref 1.15–1.40)
HCT: 25 % — ABNORMAL LOW (ref 39.0–52.0)
Hemoglobin: 8.5 g/dL — ABNORMAL LOW (ref 13.0–17.0)
O2 Saturation: 80 %
Potassium: 4.5 mmol/L (ref 3.5–5.1)
Sodium: 132 mmol/L — ABNORMAL LOW (ref 135–145)
TCO2: 25 mmol/L (ref 22–32)
pCO2, Ven: 53.1 mmHg (ref 44–60)
pH, Ven: 7.258 (ref 7.25–7.43)
pO2, Ven: 52 mmHg — ABNORMAL HIGH (ref 32–45)

## 2023-09-22 LAB — BASIC METABOLIC PANEL WITH GFR
Anion gap: 10 (ref 5–15)
Anion gap: 10 (ref 5–15)
BUN: 32 mg/dL — ABNORMAL HIGH (ref 6–20)
BUN: 34 mg/dL — ABNORMAL HIGH (ref 6–20)
CO2: 21 mmol/L — ABNORMAL LOW (ref 22–32)
CO2: 24 mmol/L (ref 22–32)
Calcium: 8.4 mg/dL — ABNORMAL LOW (ref 8.9–10.3)
Calcium: 9.2 mg/dL (ref 8.9–10.3)
Chloride: 102 mmol/L (ref 98–111)
Chloride: 97 mmol/L — ABNORMAL LOW (ref 98–111)
Creatinine, Ser: 1.24 mg/dL (ref 0.61–1.24)
Creatinine, Ser: 1.3 mg/dL — ABNORMAL HIGH (ref 0.61–1.24)
GFR, Estimated: >60 mL/min (ref >60)
GFR, Estimated: >60 mL/min (ref >60)
Glucose, Bld: 188 mg/dL — ABNORMAL HIGH (ref 70–99)
Glucose, Bld: 259 mg/dL — ABNORMAL HIGH (ref 70–99)
Potassium: 3.4 mmol/L — ABNORMAL LOW (ref 3.5–5.1)
Potassium: 4.8 mmol/L (ref 3.5–5.1)
Sodium: 131 mmol/L — ABNORMAL LOW (ref 135–145)
Sodium: 133 mmol/L — ABNORMAL LOW (ref 135–145)

## 2023-09-22 LAB — ECHO INTRAOPERATIVE TEE
AV Mean grad: 39 mmHg
Height: 65 in
Weight: 3208 [oz_av]

## 2023-09-22 LAB — HEMOGLOBIN AND HEMATOCRIT, BLOOD
HCT: 25.3 % — ABNORMAL LOW (ref 39.0–52.0)
Hemoglobin: 8.9 g/dL — ABNORMAL LOW (ref 13.0–17.0)

## 2023-09-22 LAB — PLATELET COUNT: Platelets: 233 K/uL (ref 150–400)

## 2023-09-22 LAB — MAGNESIUM: Magnesium: 3.3 mg/dL — ABNORMAL HIGH (ref 1.7–2.4)

## 2023-09-22 LAB — POCT ACTIVATED CLOTTING TIME: Activated Clotting Time: 520 s

## 2023-09-22 SURGERY — REPLACEMENT, AORTIC VALVE, OPEN
Anesthesia: General | Site: Chest

## 2023-09-22 MED ORDER — DOCUSATE SODIUM 100 MG PO CAPS
200.0000 mg | ORAL_CAPSULE | Freq: Every day | ORAL | Status: DC
  Administered 2023-09-23 – 2023-09-27 (×5): 200 mg via ORAL
  Filled 2023-09-22 (×6): qty 2

## 2023-09-22 MED ORDER — POTASSIUM CHLORIDE 10 MEQ/50ML IV SOLN
10.0000 meq | INTRAVENOUS | Status: AC
  Administered 2023-09-22 – 2023-09-23 (×3): 10 meq via INTRAVENOUS
  Filled 2023-09-22: qty 50

## 2023-09-22 MED ORDER — HEPARIN SODIUM (PORCINE) 1000 UNIT/ML IJ SOLN
INTRAMUSCULAR | Status: AC
  Filled 2023-09-22: qty 1

## 2023-09-22 MED ORDER — SODIUM CHLORIDE 0.9% FLUSH
3.0000 mL | Freq: Two times a day (BID) | INTRAVENOUS | Status: DC
  Administered 2023-09-22 (×2): 10 mL via INTRAVENOUS
  Administered 2023-09-23 (×2): 3 mL via INTRAVENOUS
  Administered 2023-09-24: 10 mL via INTRAVENOUS

## 2023-09-22 MED ORDER — FENTANYL CITRATE (PF) 250 MCG/5ML IJ SOLN
INTRAMUSCULAR | Status: AC
Start: 2023-09-22 — End: 2023-09-22
  Filled 2023-09-22: qty 5

## 2023-09-22 MED ORDER — EPHEDRINE SULFATE-NACL 50-0.9 MG/10ML-% IV SOSY
PREFILLED_SYRINGE | INTRAVENOUS | Status: DC | PRN
  Administered 2023-09-22: 5 mg via INTRAVENOUS

## 2023-09-22 MED ORDER — DEXTROSE 50 % IV SOLN: 0.0000 mL | INTRAVENOUS | Status: DC | PRN

## 2023-09-22 MED ORDER — METOPROLOL TARTRATE 12.5 MG HALF TABLET
12.5000 mg | ORAL_TABLET | Freq: Two times a day (BID) | ORAL | Status: DC
  Administered 2023-09-23 – 2023-09-24 (×3): 12.5 mg via ORAL
  Filled 2023-09-22 (×3): qty 1

## 2023-09-22 MED ORDER — EPHEDRINE 5 MG/ML INJ
INTRAVENOUS | Status: AC
  Filled 2023-09-22: qty 5

## 2023-09-22 MED ORDER — METOPROLOL TARTRATE 5 MG/5ML IV SOLN: 2.5000 mg | INTRAVENOUS | Status: DC | PRN

## 2023-09-22 MED ORDER — SODIUM CHLORIDE 0.9% FLUSH: 3.0000 mL | INTRAVENOUS | Status: DC | PRN

## 2023-09-22 MED ORDER — PROTAMINE SULFATE 10 MG/ML IV SOLN
INTRAVENOUS | Status: DC | PRN
  Administered 2023-09-22: 280 mg via INTRAVENOUS

## 2023-09-22 MED ORDER — ROCURONIUM BROMIDE 10 MG/ML (PF) SYRINGE
PREFILLED_SYRINGE | INTRAVENOUS | Status: DC | PRN
  Administered 2023-09-22: 40 mg via INTRAVENOUS
  Administered 2023-09-22: 70 mg via INTRAVENOUS
  Administered 2023-09-22: 30 mg via INTRAVENOUS

## 2023-09-22 MED ORDER — SODIUM CHLORIDE (PF) 0.9 % IJ SOLN
OROMUCOSAL | Status: DC | PRN
  Administered 2023-09-22 (×4): 4 mL via TOPICAL

## 2023-09-22 MED ORDER — HEPARIN SODIUM (PORCINE) 1000 UNIT/ML IJ SOLN
INTRAMUSCULAR | Status: DC | PRN
  Administered 2023-09-22: 30000 [IU] via INTRAVENOUS

## 2023-09-22 MED ORDER — PLASMA-LYTE A IV SOLN
INTRAVENOUS | Status: DC | PRN
  Administered 2023-09-22: 500 mL via INTRAVASCULAR

## 2023-09-22 MED ORDER — SODIUM BICARBONATE 8.4 % IV SOLN
50.0000 meq | Freq: Once | INTRAVENOUS | Status: AC
  Administered 2023-09-22: 50 meq via INTRAVENOUS

## 2023-09-22 MED ORDER — NITROGLYCERIN IN D5W 200-5 MCG/ML-% IV SOLN: 0.0000 ug/min | INTRAVENOUS | Status: DC

## 2023-09-22 MED ORDER — PANTOPRAZOLE SODIUM 40 MG IV SOLR
40.0000 mg | Freq: Every day | INTRAVENOUS | Status: AC
  Administered 2023-09-22 – 2023-09-23 (×2): 40 mg via INTRAVENOUS
  Filled 2023-09-22 (×2): qty 10

## 2023-09-22 MED ORDER — ETOMIDATE 2 MG/ML IV SOLN
INTRAVENOUS | Status: DC | PRN
  Administered 2023-09-22: 16 mg via INTRAVENOUS

## 2023-09-22 MED ORDER — DEXMEDETOMIDINE HCL IN NACL 400 MCG/100ML IV SOLN
0.0000 ug/kg/h | INTRAVENOUS | Status: DC
  Administered 2023-09-22: 0.2 ug/kg/h via INTRAVENOUS
  Filled 2023-09-22: qty 100

## 2023-09-22 MED ORDER — VANCOMYCIN HCL IN DEXTROSE 1-5 GM/200ML-% IV SOLN
1000.0000 mg | Freq: Once | INTRAVENOUS | Status: AC
  Administered 2023-09-22: 1000 mg via INTRAVENOUS
  Filled 2023-09-22: qty 200

## 2023-09-22 MED ORDER — MORPHINE SULFATE (PF) 2 MG/ML IV SOLN
1.0000 mg | INTRAVENOUS | Status: DC | PRN
  Administered 2023-09-22: 4 mg via INTRAVENOUS
  Administered 2023-09-22 – 2023-09-23 (×3): 2 mg via INTRAVENOUS
  Filled 2023-09-22: qty 1
  Filled 2023-09-22 (×2): qty 2
  Filled 2023-09-22: qty 1

## 2023-09-22 MED ORDER — POTASSIUM CHLORIDE 10 MEQ/50ML IV SOLN
10.0000 meq | INTRAVENOUS | Status: AC
  Administered 2023-09-22 (×3): 10 meq via INTRAVENOUS

## 2023-09-22 MED ORDER — SODIUM CHLORIDE 0.9 % IV SOLN: INTRAVENOUS | Status: DC | PRN

## 2023-09-22 MED ORDER — LACTATED RINGERS IV SOLN: INTRAVENOUS | Status: DC | PRN

## 2023-09-22 MED ORDER — BISACODYL 5 MG PO TBEC
10.0000 mg | DELAYED_RELEASE_TABLET | Freq: Every day | ORAL | Status: DC
  Administered 2023-09-23 – 2023-09-27 (×5): 10 mg via ORAL
  Filled 2023-09-22 (×6): qty 2

## 2023-09-22 MED ORDER — NOREPINEPHRINE 4 MG/250ML-% IV SOLN
0.0000 ug/min | INTRAVENOUS | Status: DC
  Administered 2023-09-22: 7 ug/min via INTRAVENOUS
  Administered 2023-09-23: 2 ug/min via INTRAVENOUS
  Filled 2023-09-22 (×2): qty 250

## 2023-09-22 MED ORDER — ASPIRIN 325 MG PO TBEC
325.0000 mg | DELAYED_RELEASE_TABLET | Freq: Every day | ORAL | Status: DC
Start: 2023-09-23 — End: 2023-09-25
  Administered 2023-09-23 – 2023-09-25 (×3): 325 mg via ORAL
  Filled 2023-09-22 (×3): qty 1

## 2023-09-22 MED ORDER — FENTANYL CITRATE (PF) 250 MCG/5ML IJ SOLN
INTRAMUSCULAR | Status: DC | PRN
  Administered 2023-09-22: 25 ug via INTRAVENOUS
  Administered 2023-09-22: 475 ug via INTRAVENOUS

## 2023-09-22 MED ORDER — ACETAMINOPHEN 500 MG PO TABS
1000.0000 mg | ORAL_TABLET | Freq: Four times a day (QID) | ORAL | Status: AC
  Administered 2023-09-23 – 2023-09-27 (×13): 1000 mg via ORAL
  Filled 2023-09-22 (×14): qty 2

## 2023-09-22 MED ORDER — EPINEPHRINE HCL 5 MG/250ML IV SOLN IN NS
2.0000 ug/min | INTRAVENOUS | Status: DC
  Administered 2023-09-22: 3 ug/min via INTRAVENOUS

## 2023-09-22 MED ORDER — ASPIRIN 81 MG PO CHEW
324.0000 mg | CHEWABLE_TABLET | Freq: Once | ORAL | Status: DC
  Filled 2023-09-22 (×2): qty 4

## 2023-09-22 MED ORDER — CHLORHEXIDINE GLUCONATE 0.12 % MT SOLN
OROMUCOSAL | Status: AC
  Administered 2023-09-22: 15 mL
  Filled 2023-09-22: qty 15

## 2023-09-22 MED ORDER — ETOMIDATE 2 MG/ML IV SOLN
INTRAVENOUS | Status: AC
  Filled 2023-09-22: qty 10

## 2023-09-22 MED ORDER — INSULIN REGULAR(HUMAN) IN NACL 100-0.9 UT/100ML-% IV SOLN
INTRAVENOUS | Status: DC
  Administered 2023-09-22: 13 [IU]/h via INTRAVENOUS
  Administered 2023-09-22 – 2023-09-23 (×2): 12 [IU]/h via INTRAVENOUS
  Filled 2023-09-22 (×3): qty 100

## 2023-09-22 MED ORDER — CHLORHEXIDINE GLUCONATE 0.12 % MT SOLN
15.0000 mL | OROMUCOSAL | Status: AC
  Administered 2023-09-22: 15 mL via OROMUCOSAL
  Filled 2023-09-22: qty 15

## 2023-09-22 MED ORDER — ALBUMIN HUMAN 5 % IV SOLN
INTRAVENOUS | Status: DC | PRN
Start: 2023-09-22 — End: 2023-09-22

## 2023-09-22 MED ORDER — ASPIRIN 81 MG PO CHEW: 324.0000 mg | CHEWABLE_TABLET | Freq: Every day | ORAL | Status: DC

## 2023-09-22 MED ORDER — 0.9 % SODIUM CHLORIDE (POUR BTL) OPTIME
TOPICAL | Status: DC | PRN
  Administered 2023-09-22: 5000 mL

## 2023-09-22 MED ORDER — MIDAZOLAM HCL (PF) 10 MG/2ML IJ SOLN
INTRAMUSCULAR | Status: AC
Start: 2023-09-22 — End: 2023-09-22
  Filled 2023-09-22: qty 2

## 2023-09-22 MED ORDER — OXYCODONE HCL 5 MG PO TABS
5.0000 mg | ORAL_TABLET | ORAL | Status: DC | PRN
  Administered 2023-09-27 – 2023-09-28 (×2): 10 mg via ORAL
  Filled 2023-09-22 (×2): qty 2

## 2023-09-22 MED ORDER — ACETAMINOPHEN 160 MG/5ML PO SOLN
650.0000 mg | Freq: Once | ORAL | Status: DC
  Filled 2023-09-22: qty 20.3

## 2023-09-22 MED ORDER — SODIUM CHLORIDE 0.9% FLUSH
3.0000 mL | Freq: Two times a day (BID) | INTRAVENOUS | Status: DC
  Administered 2023-09-22 – 2023-09-23 (×2): 10 mL via INTRAVENOUS
  Administered 2023-09-23: 3 mL via INTRAVENOUS
  Administered 2023-09-25: 10 mL via INTRAVENOUS

## 2023-09-22 MED ORDER — PROPOFOL 10 MG/ML IV BOLUS
INTRAVENOUS | Status: AC
  Filled 2023-09-22: qty 20

## 2023-09-22 MED ORDER — BISACODYL 10 MG RE SUPP: 10.0000 mg | Freq: Every day | RECTAL | Status: DC

## 2023-09-22 MED ORDER — PANTOPRAZOLE SODIUM 40 MG PO TBEC
40.0000 mg | DELAYED_RELEASE_TABLET | Freq: Every day | ORAL | Status: DC
  Administered 2023-09-24 – 2023-09-28 (×5): 40 mg via ORAL
  Filled 2023-09-22 (×6): qty 1

## 2023-09-22 MED ORDER — TRAMADOL HCL 50 MG PO TABS: 50.0000 mg | ORAL_TABLET | ORAL | Status: DC | PRN

## 2023-09-22 MED ORDER — HEMOSTATIC AGENTS (NO CHARGE) OPTIME
TOPICAL | Status: DC | PRN
Start: 2023-09-22 — End: 2023-09-22
  Administered 2023-09-22: 1 via TOPICAL

## 2023-09-22 MED ORDER — ACETAMINOPHEN 160 MG/5ML PO SOLN
1000.0000 mg | Freq: Four times a day (QID) | ORAL | Status: AC
Start: 2023-09-23 — End: 2023-09-27
  Administered 2023-09-24: 1000 mg
  Filled 2023-09-22 (×2): qty 40.6

## 2023-09-22 MED ORDER — METOCLOPRAMIDE HCL 5 MG/ML IJ SOLN
10.0000 mg | Freq: Four times a day (QID) | INTRAMUSCULAR | Status: AC
Start: 2023-09-22 — End: 2023-09-23
  Administered 2023-09-22 – 2023-09-23 (×6): 10 mg via INTRAVENOUS
  Filled 2023-09-22 (×5): qty 2

## 2023-09-22 MED ORDER — MIDAZOLAM HCL 2 MG/2ML IJ SOLN: 2.0000 mg | INTRAMUSCULAR | Status: DC | PRN

## 2023-09-22 MED ORDER — PHENYLEPHRINE 80 MCG/ML (10ML) SYRINGE FOR IV PUSH (FOR BLOOD PRESSURE SUPPORT)
PREFILLED_SYRINGE | INTRAVENOUS | Status: AC
  Filled 2023-09-22: qty 10

## 2023-09-22 MED ORDER — CEFAZOLIN SODIUM-DEXTROSE 2-4 GM/100ML-% IV SOLN
2.0000 g | Freq: Three times a day (TID) | INTRAVENOUS | Status: AC
  Administered 2023-09-22 – 2023-09-24 (×6): 2 g via INTRAVENOUS
  Filled 2023-09-22 (×6): qty 100

## 2023-09-22 MED ORDER — MAGNESIUM SULFATE 4 GM/100ML IV SOLN
4.0000 g | Freq: Once | INTRAVENOUS | Status: AC
  Administered 2023-09-22: 4 g via INTRAVENOUS

## 2023-09-22 MED ORDER — MIDAZOLAM HCL (PF) 5 MG/ML IJ SOLN
INTRAMUSCULAR | Status: DC | PRN
  Administered 2023-09-22: 1 mg via INTRAVENOUS
  Administered 2023-09-22: 3 mg via INTRAVENOUS
  Administered 2023-09-22 (×2): 2 mg via INTRAVENOUS
  Administered 2023-09-22: .5 mg via INTRAVENOUS
  Administered 2023-09-22: 1 mg via INTRAVENOUS
  Administered 2023-09-22: .5 mg via INTRAVENOUS

## 2023-09-22 MED ORDER — MILRINONE LACTATE IN DEXTROSE 20-5 MG/100ML-% IV SOLN
0.2500 ug/kg/min | INTRAVENOUS | Status: DC
  Administered 2023-09-22: 0.25 ug/kg/min via INTRAVENOUS
  Filled 2023-09-22: qty 100

## 2023-09-22 MED ORDER — ROCURONIUM BROMIDE 10 MG/ML (PF) SYRINGE
PREFILLED_SYRINGE | INTRAVENOUS | Status: AC
  Filled 2023-09-22: qty 20

## 2023-09-22 MED ORDER — METOPROLOL TARTRATE 25 MG/10 ML ORAL SUSPENSION: 12.5000 mg | Freq: Two times a day (BID) | ORAL | Status: DC

## 2023-09-22 MED ORDER — ALBUMIN HUMAN 5 % IV SOLN: 250.0000 mL | INTRAVENOUS | Status: DC | PRN

## 2023-09-22 MED ORDER — CHLORHEXIDINE GLUCONATE CLOTH 2 % EX PADS
6.0000 | MEDICATED_PAD | Freq: Every day | CUTANEOUS | Status: DC
  Administered 2023-09-22 – 2023-09-28 (×7): 6 via TOPICAL

## 2023-09-22 MED ORDER — SODIUM CHLORIDE 0.9% FLUSH
3.0000 mL | Freq: Two times a day (BID) | INTRAVENOUS | Status: DC
  Administered 2023-09-22: 10 mL via INTRAVENOUS
  Administered 2023-09-23 (×2): 3 mL via INTRAVENOUS

## 2023-09-22 MED ORDER — SODIUM CHLORIDE 0.9 % IV SOLN: INTRAVENOUS | Status: AC

## 2023-09-22 MED ORDER — FENTANYL CITRATE (PF) 250 MCG/5ML IJ SOLN
INTRAMUSCULAR | Status: AC
  Filled 2023-09-22: qty 5

## 2023-09-22 MED ORDER — SODIUM CHLORIDE 0.9% FLUSH
3.0000 mL | Freq: Two times a day (BID) | INTRAVENOUS | Status: DC
  Administered 2023-09-23 (×2): 3 mL via INTRAVENOUS

## 2023-09-22 MED ORDER — ONDANSETRON HCL 4 MG/2ML IJ SOLN: 4.0000 mg | Freq: Four times a day (QID) | INTRAMUSCULAR | Status: DC | PRN

## 2023-09-22 MED ORDER — PHENYLEPHRINE 80 MCG/ML (10ML) SYRINGE FOR IV PUSH (FOR BLOOD PRESSURE SUPPORT)
PREFILLED_SYRINGE | INTRAVENOUS | Status: DC | PRN
  Administered 2023-09-22: 80 ug via INTRAVENOUS
  Administered 2023-09-22 (×2): 160 ug via INTRAVENOUS
  Administered 2023-09-22: 80 ug via INTRAVENOUS

## 2023-09-22 SURGICAL SUPPLY — 63 items
ADAPTER CARDIO PERF ANTE/RETRO (ADAPTER) ×2 IMPLANT
BAG DECANTER FOR FLEXI CONT (MISCELLANEOUS) ×2 IMPLANT
BLADE CLIPPER SURG (BLADE) ×2 IMPLANT
BLADE STERNUM SYSTEM 6 (BLADE) ×2 IMPLANT
BLADE SURG 15 STRL LF DISP TIS (BLADE) ×2 IMPLANT
CANISTER SUCTION 3000ML PPV (SUCTIONS) ×2 IMPLANT
CANNULA AORTIC ROOT 9FR (CANNULA) ×2 IMPLANT
CANNULA EZ GLIDE AORTIC 21FR (CANNULA) ×2 IMPLANT
CANNULA GUNDRY RCSP 15FR (MISCELLANEOUS) ×2 IMPLANT
CANNULA MC2 2 STG 36/46 NON-V (CANNULA) IMPLANT
CATH HEART VENT LEFT (CATHETERS) ×2 IMPLANT
CATH ROBINSON RED A/P 18FR (CATHETERS) ×4 IMPLANT
CATH THORACIC 36FR (CATHETERS) IMPLANT
CNTNR URN SCR LID CUP LEK RST (MISCELLANEOUS) IMPLANT
CONTAINER PROTECT SURGISLUSH (MISCELLANEOUS) ×4 IMPLANT
DEVICE SUT CK QUICK LOAD MINI (Prosthesis & Implant Heart) IMPLANT
DRAIN CHANNEL 28F RND 3/8 FF (WOUND CARE) IMPLANT
DRAPE SRG 135X102X78XABS (DRAPES) ×2 IMPLANT
DRAPE WARM FLUID 44X44 (DRAPES) ×2 IMPLANT
DRSG COVADERM 4X14 (GAUZE/BANDAGES/DRESSINGS) ×2 IMPLANT
ELECTRODE REM PT RTRN 9FT ADLT (ELECTROSURGICAL) ×4 IMPLANT
FELT TEFLON 1X6 (MISCELLANEOUS) ×4 IMPLANT
GAUZE SPONGE 4X4 12PLY STRL (GAUZE/BANDAGES/DRESSINGS) ×4 IMPLANT
GLOVE SS BIOGEL STRL SZ 7.5 (GLOVE) ×2 IMPLANT
GOWN STRL REUS W/ TWL LRG LVL3 (GOWN DISPOSABLE) ×8 IMPLANT
GOWN STRL REUS W/ TWL XL LVL3 (GOWN DISPOSABLE) ×2 IMPLANT
HEMOSTAT POWDER SURGIFOAM 1G (HEMOSTASIS) ×6 IMPLANT
HEMOSTAT SURGICEL 2X14 (HEMOSTASIS) ×2 IMPLANT
KIT BASIN OR (CUSTOM PROCEDURE TRAY) ×2 IMPLANT
KIT SUCTION CATH 14FR (SUCTIONS) ×4 IMPLANT
KIT SUT CK MINI COMBO 4X17 (Prosthesis & Implant Heart) IMPLANT
KIT TURNOVER KIT B (KITS) ×2 IMPLANT
LINE VENT (MISCELLANEOUS) IMPLANT
NS IRRIG 1000ML POUR BTL (IV SOLUTION) ×10 IMPLANT
PACK E OPEN HEART (SUTURE) ×2 IMPLANT
PACK OPEN HEART (CUSTOM PROCEDURE TRAY) ×2 IMPLANT
PAD ARMBOARD POSITIONER FOAM (MISCELLANEOUS) ×4 IMPLANT
POSITIONER HEAD DONUT 9IN (MISCELLANEOUS) ×2 IMPLANT
SET MPS 3-ND DEL (MISCELLANEOUS) IMPLANT
SUT BONE WAX W31G (SUTURE) ×2 IMPLANT
SUT EB EXC GRN/WHT 2-0 V-5 (SUTURE) ×4 IMPLANT
SUT ETHIBOND 2 0 SH 36X2 (SUTURE) ×2 IMPLANT
SUT PROLENE 3 0 SH DA (SUTURE) ×2 IMPLANT
SUT PROLENE 4-0 RB1 .5 CRCL 36 (SUTURE) ×4 IMPLANT
SUT PROLENE 5 0 C 1 36 (SUTURE) IMPLANT
SUT SILK 1 MH (SUTURE) ×2 IMPLANT
SUT STEEL 6MS V (SUTURE) IMPLANT
SUT STEEL SZ 6 DBL 3X14 BALL (SUTURE) IMPLANT
SUT VIC AB 1 CTX36XBRD ANBCTR (SUTURE) ×4 IMPLANT
SUT VIC AB 2-0 CTX 27 (SUTURE) IMPLANT
SUT VIC AB 3-0 X1 27 (SUTURE) IMPLANT
SYR 10ML KIT SKIN ADHESIVE (MISCELLANEOUS) IMPLANT
SYSTEM SAHARA CHEST DRAIN ATS (WOUND CARE) ×2 IMPLANT
TAPE CLOTH SURG 4X10 WHT LF (GAUZE/BANDAGES/DRESSINGS) IMPLANT
TAPE PAPER 2X10 WHT MICROPORE (GAUZE/BANDAGES/DRESSINGS) IMPLANT
TOWEL GREEN STERILE (TOWEL DISPOSABLE) ×2 IMPLANT
TOWEL GREEN STERILE FF (TOWEL DISPOSABLE) ×2 IMPLANT
TRAY FOLEY SLVR 16FR TEMP STAT (SET/KITS/TRAYS/PACK) ×2 IMPLANT
TUBE CONNECTING 12X1/4 (SUCTIONS) IMPLANT
TUBE SUCT INTRACARD DLP 20F (MISCELLANEOUS) ×2 IMPLANT
UNDERPAD 30X36 HEAVY ABSORB (UNDERPADS AND DIAPERS) ×2 IMPLANT
VALVE AORTIC SZ23 INSP/RESIL (Prosthesis & Implant Heart) IMPLANT
WATER STERILE IRR 1000ML POUR (IV SOLUTION) ×4 IMPLANT

## 2023-09-22 NOTE — Anesthesia Procedure Notes (Signed)
 Arterial Line Insertion Start/End6/19/2025 7:15 AM, 09/22/2023 7:20 AM Performed by: Colen Daunt, CRNA, CRNA  Patient location: Pre-op. Preanesthetic checklist: patient identified, IV checked, site marked, risks and benefits discussed, surgical consent, monitors and equipment checked, pre-op evaluation, timeout performed and anesthesia consent Lidocaine  1% used for infiltration Left, radial was placed Catheter size: 20 G Hand hygiene performed  and maximum sterile barriers used  Allen's test indicative of satisfactory collateral circulation Attempts: 1 Procedure performed using ultrasound guided technique. Following insertion, dressing applied and Biopatch. Post procedure assessment: normal and unchanged  Patient tolerated the procedure well with no immediate complications.

## 2023-09-22 NOTE — Progress Notes (Signed)
 Patient extubated per rapid wean protocol with RT and RN at bedside, NIF -22, VC 1.9L. Cuff leak present prior to extubating, no stridor noted post. Patient on 3L [place] and tolerating well at this time.

## 2023-09-22 NOTE — Discharge Instructions (Signed)

## 2023-09-22 NOTE — Progress Notes (Signed)
 Patient ID: Jonathon Bray, male   DOB: [birth year], 58 y.o.   MRN: [id number]   TCTS Evening Rounds:   Hemodynamically stable in sinus rhythm 87 CI = 2.13 on milrinone 0.3, NE 6, epi 3.  Just extubated. Sleepy.  Urine output good  CT output low  CBC    Component Value Date/Time   WBC 46.8 (H) 09/22/2023 1253   RBC 4.26 09/22/2023 1253   HGB 11.2 (L) 09/22/2023 1637   HGB 15.5 06/06/2023 1633   HCT 33.0 (L) 09/22/2023 1637   HCT 45.0 06/06/2023 1633   PLT 218 09/22/2023 1253   PLT 246 06/06/2023 1633   MCV 84.5 09/22/2023 1253   MCV 85 06/06/2023 1633   MCH 29.3 09/22/2023 1253   MCHC 34.7 09/22/2023 1253   RDW 12.6 09/22/2023 1253   RDW 12.6 06/06/2023 1633   LYMPHSABS 3.2 09/16/2023 1722   MONOABS 0.7 09/16/2023 1722   EOSABS 0.4 09/16/2023 1722   BASOSABS 0.0 09/16/2023 1722     BMET    Component Value Date/Time   NA 137 09/22/2023 1637   NA 137 07/27/2023 1314   K 3.3 (L) 09/22/2023 1637   CL 96 (L) 09/22/2023 1132   CO2 24 09/22/2023 0008   GLUCOSE 256 (H) 09/22/2023 1132   BUN 33 (H) 09/22/2023 1132   BUN 38 (H) 07/27/2023 1314   CREATININE 1.10 09/22/2023 1132   CALCIUM  9.2 09/22/2023 0008   EGFR 64 07/27/2023 1314   GFRNONAA >60 09/22/2023 0008     A/P:  Stable postop course. Continue current plans

## 2023-09-22 NOTE — Interval H&P Note (Signed)
 History and Physical Interval Note:  09/22/2023 7:53 AM  Jonathon Bray  has presented today for surgery, with the diagnosis of SEVERE AS.  The various methods of treatment have been discussed with the patient and family. After consideration of risks, benefits and other options for treatment, the patient has consented to  Procedure(s): REPLACEMENT, AORTIC VALVE, OPEN (N/A) ECHOCARDIOGRAM, TRANSESOPHAGEAL, INTRAOPERATIVE (N/A) as a surgical intervention.  The patient's history has been reviewed, patient examined, no change in status, stable for surgery.  I have reviewed the patient's chart and labs.  Questions were answered to the patient's satisfaction.     [name]

## 2023-09-22 NOTE — Inpatient Diabetes Management (Signed)
 Inpatient Diabetes Program Recommendations  AACE/ADA: New Consensus Statement on Inpatient Glycemic Control (2015)  Target Ranges:  Prepandial:   less than 140 mg/dL      Peak postprandial:   less than 180 mg/dL (1-2 hours)      Critically ill patients:  140 - 180 mg/dL   Lab Results  Component Value Date   GLUCAP 217 (H) 09/22/2023   HGBA1C 8.3 (H) 09/22/2023    Review of Glycemic Control  Diabetes history: DM2 Outpatient Diabetes medications: Tresiba 54 daily, Ozempic 0.5 weekly, Humalog  16 TID Current orders for Inpatient glycemic control: IV insulin  per EndoTool for hyperglycemia  HgbA1C - 8.3%  Inpatient Diabetes Program Recommendations:    When transitioning off drip, give Semglee 1-2 hours prior to discontinuation of drip.  Semglee 30 units Q24H  Novolog  0-15 TID with meals  Novolog  6-8 units TID for meal coverage if eating > 50%  Will need to f/u with PCP/Endo after discharge.   Will follow trends.   Thank you. Joni Net, RD, LDN, CDCES Inpatient Diabetes Coordinator [phone]

## 2023-09-22 NOTE — Anesthesia Procedure Notes (Signed)
 Central Venous Catheter Insertion Performed by: Jonne Netters, MD, anesthesiologist Patient location: Pre-op. Preanesthetic checklist: patient identified, IV checked, risks and benefits discussed, surgical consent, monitors and equipment checked, pre-op evaluation, timeout performed and anesthesia consent Lidocaine  1% used for infiltration and patient sedated Hand hygiene performed , maximum sterile barriers used  and Seldinger technique used Catheter size: 9 Fr Central line was placed.MAC introducer Swan type:thermodilution Procedure performed using ultrasound guided technique. Ultrasound Notes:anatomy identified, needle tip was noted to be adjacent to the nerve/plexus identified, no ultrasound evidence of intravascular and/or intraneural injection and image(s) printed for medical record Attempts: 1 Following insertion, line sutured and dressing applied. Post procedure assessment: blood return through all ports, free fluid flow and no air  Patient tolerated the procedure well with no immediate complications. Additional procedure comments: PA catheter:  Routine monitors. Timeout, sterile prep, drape, FBP R neck.  Pt remained sitting, unable to lie flat  1% Lido local, finder and trocar RIJ 1st pass with US  guidance.  MAC introducer placed over J wire. PA catheter in easily.  Sterile dressing applied.  Patient tolerated well, VSS.  Fay Hoop, MD.

## 2023-09-22 NOTE — Transfer of Care (Signed)
 Immediate Anesthesia Transfer of Care Note  Patient: Jonathon Bray  Procedure(s) Performed: REPLACEMENT, AORTIC VALVE, OPEN UTILIZING INSPIRIS RESILIA AORTIC VALVE (Chest) ECHOCARDIOGRAM, TRANSESOPHAGEAL, INTRAOPERATIVE  Patient Location: ICU  Anesthesia Type:General  Level of Consciousness: sedated and Patient remains intubated per anesthesia plan  Airway & Oxygen Therapy: Patient remains intubated per anesthesia plan and Patient placed on Ventilator (see vital sign flow sheet for setting)  Post-op Assessment: Report given to RN and Post -op Vital signs reviewed and stable  Post vital signs: Reviewed and stable  Last Vitals:  Vitals Value Taken Time  BP 112/61  73 09/22/23  1236  Temp    Pulse 99 09/22/23 12:36  Resp 9 09/22/23 12:36  SpO2 92 % 09/22/23 12:36  Vitals shown include unfiled device data.  Last Pain:  Vitals:   09/22/23 0709  TempSrc:   PainSc: 0-No pain      Patients Stated Pain Goal: 0 (09/21/23 2200)  Complications: No notable events documented.

## 2023-09-22 NOTE — TOC Initial Note (Signed)
 Transition of Care [facility]) - Initial/Assessment Note    Patient Details  Name: Jonathon Bray MRN: [id number] Date of Birth: 07/07/1965  [facility]) CM/SW Contact:    Cosimo Diones, RN Phone Number: 09/22/2023, 3:16 PM  Clinical Narrative:   Patient is a transfer from 2-C. Patient presented for severe aortic stenosis-post AVR. PTA patient was from home with spouse. Case Manager will continue to follow for transition of care needs as the patient progresses.             Expected Discharge Plan: Home/Self Care Barriers to Discharge: Continued Medical Work up    Living arrangements for the past 2 months: Single Family Home                   DME Agency: NA   Prior Living Arrangements/Services Living arrangements for the past 2 months: Single Family Home Lives with:: Spouse  Activities of Daily Living   ADL Screening (condition at time of admission) Independently performs ADLs?: Yes (appropriate for developmental age) Is the patient deaf or have difficulty hearing?: No Does the patient have difficulty seeing, even when wearing glasses/contacts?: No Does the patient have difficulty concentrating, remembering, or making decisions?: No   Emotional Assessment Appearance:: Appears stated age       Alcohol / Substance Use: Not Applicable Psych Involvement: No (comment)  Admission diagnosis:  Severe aortic stenosis by prior echocardiogram [I35.0] S/P aortic valve replacement with bioprosthetic valve [Z95.3] Patient Active Problem List   Diagnosis Date Noted   S/P aortic valve replacement with bioprosthetic valve 09/22/2023   Critical aortic valve stenosis 09/16/2023   Heart failure (HCC) 09/10/2023   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   Nonrheumatic aortic valve stenosis 05/11/2022   Family history of premature CAD 05/11/2022   Atherosclerosis of aorta (HCC) 05/11/2022   Aortic dilatation (HCC) 05/11/2022   Hyperlipidemia associated with type 2  diabetes mellitus (HCC) 05/11/2022   Benign hypertension 05/11/2022   Non-ST elevation (NSTEMI) myocardial infarction (HCC) 05/10/2022   Hypercholesteremia 05/10/2022   Type 2 diabetes mellitus with complication, with long-term current use of insulin  (HCC) 05/10/2022   NSTEMI (non-ST elevated myocardial infarction) (HCC) 05/10/2022   Abnormal cardiac enzyme level 05/10/2022   Spondylolysis, lumbar region 01/01/2021   Solitary pulmonary nodule on lung CT 04/02/2020   Orthostatic dizziness 01/24/2014   Primary hypertension 01/24/2014   Diabetes mellitus (HCC) 06/01/2012   PCP:  Orlena Bitters, MD Pharmacy:   [facility] [address] [place] [facility] [address] [place] [address] Phone: [phone] Fax: [phone]  [facility] [address] [place] [address] Phone: [phone] Fax: [phone]  Social Drivers of Health (SDOH) Social History: SDOH Screenings   Food Insecurity: No Food Insecurity (09/16/2023)  Housing: Low Risk  (09/16/2023)  Transportation Needs: No Transportation Needs (09/16/2023)  Utilities: Not At Risk (09/16/2023)  Social Connections: Unknown (08/17/2021)   Received from [facility]  Tobacco Use: Low Risk  (09/22/2023)    Readmission Risk Interventions     No data to display

## 2023-09-22 NOTE — Plan of Care (Signed)
  Problem: Education: Goal: Knowledge of General Education information will improve Description: Including pain rating scale, medication(s)/side effects and non-pharmacologic comfort measures Outcome: Progressing   Problem: Health Behavior/Discharge Planning: Goal: Ability to manage health-related needs will improve Outcome: Progressing   Problem: Clinical Measurements: Goal: Ability to maintain clinical measurements within normal limits will improve Outcome: Progressing Goal: Will remain free from infection Outcome: Progressing Goal: Diagnostic test results will improve Outcome: Progressing Goal: Respiratory complications will improve Outcome: Progressing Goal: Cardiovascular complication will be avoided Outcome: Progressing   Problem: Activity: Goal: Risk for activity intolerance will decrease Outcome: Progressing   Problem: Nutrition: Goal: Adequate nutrition will be maintained Outcome: Progressing   Problem: Coping: Goal: Level of anxiety will decrease Outcome: Progressing   Problem: Elimination: Goal: Will not experience complications related to urinary retention Outcome: Progressing   Problem: Pain Managment: Goal: General experience of comfort will improve and/or be controlled Outcome: Progressing   Problem: Safety: Goal: Ability to remain free from injury will improve Outcome: Progressing   Problem: Skin Integrity: Goal: Risk for impaired skin integrity will decrease Outcome: Progressing   Problem: Coping: Goal: Ability to adjust to condition or change in health will improve Outcome: Progressing   Problem: Fluid Volume: Goal: Ability to maintain a balanced intake and output will improve Outcome: Progressing   Problem: Health Behavior/Discharge Planning: Goal: Ability to identify and utilize available resources and services will improve Outcome: Progressing Goal: Ability to manage health-related needs will improve Outcome: Progressing   Problem:  Metabolic: Goal: Ability to maintain appropriate glucose levels will improve Outcome: Progressing   Problem: Nutritional: Goal: Maintenance of adequate nutrition will improve Outcome: Progressing Goal: Progress toward achieving an optimal weight will improve Outcome: Progressing   Problem: Skin Integrity: Goal: Risk for impaired skin integrity will decrease Outcome: Progressing   Problem: Tissue Perfusion: Goal: Adequacy of tissue perfusion will improve Outcome: Progressing   Problem: Activity: Goal: Risk for activity intolerance will decrease Outcome: Progressing   Problem: Cardiac: Goal: Will achieve and/or maintain hemodynamic stability Outcome: Progressing   Problem: Clinical Measurements: Goal: Postoperative complications will be avoided or minimized Outcome: Progressing   Problem: Respiratory: Goal: Respiratory status will improve Outcome: Progressing   Problem: Skin Integrity: Goal: Wound healing without signs and symptoms of infection Outcome: Progressing Goal: Risk for impaired skin integrity will decrease Outcome: Progressing   Problem: Urinary Elimination: Goal: Ability to achieve and maintain adequate renal perfusion and functioning will improve Outcome: Progressing

## 2023-09-22 NOTE — Progress Notes (Signed)
  Progress Note Note entered in error.  Patient to the OR before being seen.  Will follow-up postoperatively.  Arnoldo Lapping 09/22/2023 8:06 AM

## 2023-09-22 NOTE — Anesthesia Postprocedure Evaluation (Signed)
 Anesthesia Post Note  Patient: Jonathon Bray  Procedure(s) Performed: REPLACEMENT, AORTIC VALVE, OPEN UTILIZING INSPIRIS RESILIA AORTIC VALVE (Chest) ECHOCARDIOGRAM, TRANSESOPHAGEAL, INTRAOPERATIVE     Patient location during evaluation: SICU Anesthesia Type: General Level of consciousness: sedated and patient cooperative Pain management: pain level controlled Vital Signs Assessment: post-procedure vital signs reviewed and stable Respiratory status: spontaneous breathing, respiratory function stable and patient connected to nasal cannula oxygen (recently extubated) Cardiovascular status: stable on Milrinone, epi, Norepi infusions. Postop Assessment: no apparent nausea or vomiting Anesthetic complications: no Comments: Much improved from pre-op  No notable events documented.  Last Vitals:  Vitals:   09/22/23 1642 09/22/23 1700  BP:  (!) 88/61  Pulse: 85 81  Resp: 18 17  Temp: (!) 36.3 C (!) 36.3 C  SpO2: 94% 91%    Last Pain:  Vitals:   09/22/23 1642  TempSrc:   PainSc: 8                  Tiffanny Lamarche,E. Chey Cho

## 2023-09-22 NOTE — Brief Op Note (Signed)
 09/16/2023 - 09/22/2023  11:11 AM  PATIENT:  Jonathon Bray  58 y.o. male  PRE-OPERATIVE DIAGNOSIS:  SEVERE AORTIC STENOSIS  POST-OPERATIVE DIAGNOSIS:  SEVERE BICUSPID AORTIC VALVE STENOSIS, BILATERAL PLEURAL EFFUSIONS  PROCEDURES:   REPLACEMENT, AORTIC VALVE, OPEN UTILIZING INSPIRIS RESILIA AORTIC VALVE, DRAINAGE OF BILATERAL PLEURAL EFFUSIONS   ECHOCARDIOGRAM, TRANSESOPHAGEAL, INTRAOPERATIVE   SURGEON:   [name], MD   PHYSICIAN ASSISTANT: Anilah Huck  ASSISTANTS: Pesare, Piyanuch, RN, Scrub Person         Price, Patria Bookbinder, RN, RN First Assistant   ANESTHESIA:   general  EBL:    BLOOD ADMINISTERED:none  DRAINS: bilateral pleural and mediastinal drains   LOCAL MEDICATIONS USED:  NONE  SPECIMEN:  aortic valve leaflets  DISPOSITION OF SPECIMEN:  PATHOLOGY  COUNTS:  Correct  DICTATION: .Dragon Dictation  PLAN OF CARE: Admit to inpatient   PATIENT DISPOSITION:  ICU - intubated and hemodynamically stable.   Delay start of Pharmacological VTE agent (>24hrs) due to surgical blood loss or risk of bleeding: yes

## 2023-09-22 NOTE — Hospital Course (Addendum)
 HPI: Tashi Band is a 58 year old man with a history of hypertension, type 2 insulin -dependent diabetes, stage II chronic kidney disease, acute HFrEF, obesity, ascending aortic ectasia, and aortic stenosis.     He presented in early March after echocardiogram showed progression of aortic stenosis and low EF.  CBGs were uncontrolled and he needed a dental evaluation. Cardiac catheterization showed no CAD.    He was to see dental then follow up in the office but did not follow up. He presented to the ED with a syncopal episode and hypotension. He has seen a dentist and underwent cleaning. HgbA1C is still elevated at 9.0. Echocardiogram on 09/11/23 showed LVEF 20-25%, left ventricle with global hypokinesis, trivial mitral valve regurgitation, severe calcification of the aortic valve with severe aortic valve stenosis, mean gradient measuring , aortic valve area measuring 0.4 and mild aortic valve regurgitation.   He was admitted to [facility]. Cardiothoracic surgery was consulted and Dr. Luna Salinas reviewed the diagnostic studies and determined the patient would benefit from surgical intervention. He reviewed the patient's treatment options as well as the risks and benefits of surgery. He was agreeable to proceed with surgery.  Hospital Course:  The patient remained stable while in [facility]. He decided he did not want to be on lifelong anticoagulation so he opted for a bioprosthetic valve even with risk of early failure. He was brought to the operating room on 09/22/23 and underwent an aortic valve replacement (using an Inspiris, size 23 mm). He tolerated the procedure well and was transferred to the SICU in stable condition. He was extubated the afternoon of surgery. He may have aspirated over night. He was monitored closely. Harlo Ligas, a line, chest tubes and foley were all removed early in his post operative course. He had a LBBB. He was weaned off Milrinone, Epinephrine , and Nor  Epinephrine  drips. He was then started on Lopressor .

## 2023-09-22 NOTE — Anesthesia Procedure Notes (Signed)
 Procedure Name: Intubation Date/Time: 09/22/2023 8:32 AM  Performed by: Colen Daunt, CRNAPre-anesthesia Checklist: Patient identified, Emergency Drugs available, Suction available and Patient being monitored Patient Re-evaluated:Patient Re-evaluated prior to induction Oxygen Delivery Method: Circle system utilized Preoxygenation: Pre-oxygenation with 100% oxygen Induction Type: IV induction Ventilation: Mask ventilation without difficulty Laryngoscope Size: Glidescope and 4 Grade View: Grade I Tube type: Oral Tube size: 8.0 mm Number of attempts: 1 Airway Equipment and Method: Rigid stylet and Video-laryngoscopy Placement Confirmation: ETT inserted through vocal cords under direct vision, positive ETCO2 and breath sounds checked- equal and bilateral Secured at: 23 cm Tube secured with: Tape Dental Injury: Teeth and Oropharynx as per pre-operative assessment  Comments: Elective glidescope intubation.  Patient unable to lay flat.

## 2023-09-23 ENCOUNTER — Inpatient Hospital Stay: Admitting: Physician Assistant

## 2023-09-23 ENCOUNTER — Other Ambulatory Visit: Payer: Self-pay | Admitting: Cardiology

## 2023-09-23 ENCOUNTER — Inpatient Hospital Stay (HOSPITAL_COMMUNITY)

## 2023-09-23 ENCOUNTER — Encounter (HOSPITAL_COMMUNITY): Payer: Self-pay | Admitting: Thoracic Surgery (Cardiothoracic Vascular Surgery)

## 2023-09-23 DIAGNOSIS — Z953 Presence of xenogenic heart valve: Secondary | ICD-10-CM

## 2023-09-23 LAB — BASIC METABOLIC PANEL WITH GFR
Anion gap: 12 (ref 5–15)
Anion gap: 6 (ref 5–15)
BUN: 33 mg/dL — ABNORMAL HIGH (ref 6–20)
BUN: 39 mg/dL — ABNORMAL HIGH (ref 6–20)
CO2: 21 mmol/L — ABNORMAL LOW (ref 22–32)
CO2: 24 mmol/L (ref 22–32)
Calcium: 8.3 mg/dL — ABNORMAL LOW (ref 8.9–10.3)
Calcium: 8.3 mg/dL — ABNORMAL LOW (ref 8.9–10.3)
Chloride: 100 mmol/L (ref 98–111)
Chloride: 101 mmol/L (ref 98–111)
Creatinine, Ser: 1.33 mg/dL — ABNORMAL HIGH (ref 0.61–1.24)
Creatinine, Ser: 1.54 mg/dL — ABNORMAL HIGH (ref 0.61–1.24)
GFR, Estimated: >60 mL/min (ref >60)
GFR, Estimated: 52 mL/min — ABNORMAL LOW (ref >60)
Glucose, Bld: 99 mg/dL (ref 70–99)
Glucose, Bld: 80 mg/dL (ref 70–99)
Potassium: 4.5 mmol/L (ref 3.5–5.1)
Potassium: 5.4 mmol/L — ABNORMAL HIGH (ref 3.5–5.1)
Sodium: 133 mmol/L — ABNORMAL LOW (ref 135–145)
Sodium: 131 mmol/L — ABNORMAL LOW (ref 135–145)

## 2023-09-23 LAB — CBC
HCT: 35.4 % — ABNORMAL LOW (ref 39.0–52.0)
HCT: 34.1 % — ABNORMAL LOW (ref 39.0–52.0)
Hemoglobin: 12.4 g/dL — ABNORMAL LOW (ref 13.0–17.0)
Hemoglobin: 12 g/dL — ABNORMAL LOW (ref 13.0–17.0)
MCH: 29.2 pg (ref 26.0–34.0)
MCH: 29.6 pg (ref 26.0–34.0)
MCHC: 35 g/dL (ref 30.0–36.0)
MCHC: 35.2 g/dL (ref 30.0–36.0)
MCV: 83.5 fL (ref 80.0–100.0)
MCV: 84 fL (ref 80.0–100.0)
Platelets: 210 10*3/uL (ref 150–400)
Platelets: 166 10*3/uL (ref 150–400)
RBC: 4.24 MIL/uL (ref 4.22–5.81)
RBC: 4.06 MIL/uL — ABNORMAL LOW (ref 4.22–5.81)
RDW: 12.9 % (ref 11.5–15.5)
RDW: 13.1 % (ref 11.5–15.5)
WBC: 37.5 10*3/uL — ABNORMAL HIGH (ref 4.0–10.5)
WBC: 31.1 10*3/uL — ABNORMAL HIGH (ref 4.0–10.5)
nRBC: 0 % (ref 0.0–0.2)
nRBC: 0 % (ref 0.0–0.2)

## 2023-09-23 LAB — GLUCOSE, CAPILLARY
Glucose-Capillary: 105 mg/dL — ABNORMAL HIGH (ref 70–99)
Glucose-Capillary: 117 mg/dL — ABNORMAL HIGH (ref 70–99)
Glucose-Capillary: 120 mg/dL — ABNORMAL HIGH (ref 70–99)
Glucose-Capillary: 120 mg/dL — ABNORMAL HIGH (ref 70–99)
Glucose-Capillary: 122 mg/dL — ABNORMAL HIGH (ref 70–99)
Glucose-Capillary: 122 mg/dL — ABNORMAL HIGH (ref 70–99)
Glucose-Capillary: 143 mg/dL — ABNORMAL HIGH (ref 70–99)
Glucose-Capillary: 143 mg/dL — ABNORMAL HIGH (ref 70–99)
Glucose-Capillary: 152 mg/dL — ABNORMAL HIGH (ref 70–99)
Glucose-Capillary: 159 mg/dL — ABNORMAL HIGH (ref 70–99)
Glucose-Capillary: 160 mg/dL — ABNORMAL HIGH (ref 70–99)
Glucose-Capillary: 162 mg/dL — ABNORMAL HIGH (ref 70–99)
Glucose-Capillary: 170 mg/dL — ABNORMAL HIGH (ref 70–99)
Glucose-Capillary: 71 mg/dL (ref 70–99)
Glucose-Capillary: 87 mg/dL (ref 70–99)

## 2023-09-23 LAB — MAGNESIUM
Magnesium: 2.7 mg/dL — ABNORMAL HIGH (ref 1.7–2.4)
Magnesium: 2.4 mg/dL (ref 1.7–2.4)

## 2023-09-23 LAB — SURGICAL PATHOLOGY

## 2023-09-23 MED ORDER — ORAL CARE MOUTH RINSE: 15.0000 mL | OROMUCOSAL | Status: DC | PRN

## 2023-09-23 MED ORDER — FUROSEMIDE 10 MG/ML IJ SOLN
20.0000 mg | Freq: Two times a day (BID) | INTRAMUSCULAR | Status: DC
  Administered 2023-09-23 – 2023-09-24 (×4): 20 mg via INTRAVENOUS
  Filled 2023-09-23 (×5): qty 2

## 2023-09-23 MED ORDER — ENOXAPARIN SODIUM 40 MG/0.4ML IJ SOSY
40.0000 mg | PREFILLED_SYRINGE | Freq: Every day | INTRAMUSCULAR | Status: DC
  Administered 2023-09-23 – 2023-09-27 (×5): 40 mg via SUBCUTANEOUS
  Filled 2023-09-23 (×5): qty 0.4

## 2023-09-23 MED ORDER — INSULIN ASPART 100 UNIT/ML IJ SOLN
0.0000 [IU] | Freq: Every day | INTRAMUSCULAR | Status: DC
  Administered 2023-09-24: 2 [IU] via SUBCUTANEOUS

## 2023-09-23 MED ORDER — INSULIN ASPART 100 UNIT/ML IJ SOLN
0.0000 [IU] | Freq: Three times a day (TID) | INTRAMUSCULAR | Status: DC
  Administered 2023-09-24: 11 [IU] via SUBCUTANEOUS
  Administered 2023-09-24: 7 [IU] via SUBCUTANEOUS
  Administered 2023-09-24: 4 [IU] via SUBCUTANEOUS
  Administered 2023-09-25: 3 [IU] via SUBCUTANEOUS
  Administered 2023-09-25 – 2023-09-26 (×2): 4 [IU] via SUBCUTANEOUS
  Administered 2023-09-26: 3 [IU] via SUBCUTANEOUS
  Administered 2023-09-27: 15 [IU] via SUBCUTANEOUS

## 2023-09-23 MED ORDER — INSULIN GLARGINE-YFGN 100 UNIT/ML ~~LOC~~ SOLN
30.0000 [IU] | Freq: Two times a day (BID) | SUBCUTANEOUS | Status: DC
  Administered 2023-09-23 – 2023-09-24 (×2): 30 [IU] via SUBCUTANEOUS
  Filled 2023-09-23 (×3): qty 0.3

## 2023-09-23 NOTE — Plan of Care (Signed)
  Problem: SLP Dysphagia Goals Goal: Misc Dysphagia Goal Flowsheets (Taken 09/23/2023 1149) Misc Dysphagia Goal: Pt will tolerate a mechanical altered diet and nectar-thick liquids with no overt or subtle s/s of aspriation or concern for decline in pulmonary status

## 2023-09-23 NOTE — Progress Notes (Signed)
      [address]       [place] [address]             [phone]      POD # 1 AVR  Comfortable  BP (!) 83/52   Pulse 84   Temp 97.8 F (36.6 C) (Oral)   Resp 14   Ht 5' 5 (1.651 m)   Wt 93.4 kg   SpO2 95%   BMI 34.27 kg/m  Norepi at 4   Intake/Output Summary (Last 24 hours) at 09/23/2023 1709 Last data filed at 09/23/2023 1700 Gross per 24 hour  Intake 1712.62 ml  Output 1250 ml  Net 462.62 ml   CBG well controlled but requiring high doses of IV insulin , has recently dropped to 2.6 units/ hr Will transition to Sem glee + SSI PM labs pending  Landon Pinion C. Luna Salinas, MD [facility] [phone]

## 2023-09-23 NOTE — Progress Notes (Signed)
 1 Day Post-Op Procedure(s) (LRB): REPLACEMENT, AORTIC VALVE, OPEN UTILIZING INSPIRIS RESILIA AORTIC VALVE (N/A) ECHOCARDIOGRAM, TRANSESOPHAGEAL, INTRAOPERATIVE (N/A) Subjective A little sore Denies nausea Noted to have difficulty swallowing last night  Objective: Vital signs in last 24 hours: Temp:  [96.8 F (36 C)-99.1 F (37.3 C)] 98.4 F (36.9 C) (06/20 0500) Pulse Rate:  [53-108] 103 (06/20 0500) Cardiac Rhythm: Normal sinus rhythm;Sinus tachycardia (06/19 2000) Resp:  [14-32] 21 (06/20 0500) BP: (88-119)/(55-80) 100/63 (06/20 0000) SpO2:  [80 %-100 %] 97 % (06/20 0500) Arterial Line BP: (91-134)/(48-71) 120/57 (06/20 0500) FiO2 (%):  [40 %-50 %] 40 % (06/19 1537) Weight:  [93.4 kg] 93.4 kg (06/20 0500)  Hemodynamic parameters for last 24 hours: PAP: (30-61)/(5-42) 42/21 CVP:  [2 mmHg-26 mmHg] 9 mmHg CO:  [4.4 L/min-5.6 L/min] 5.6 L/min CI:  [2.2 L/min/m2-2.8 L/min/m2] 2.8 L/min/m2  Intake/Output from previous day: 06/19 0701 - 06/20 0700 In: 5355.7 [I.V.:2195.5; Blood:920; IV Piggyback:2240.3] Out: 2959 [Urine:1165; Blood:1314; Chest Tube:480] Intake/Output this shift: No intake/output data recorded.  General appearance: alert, cooperative, and no distress Neurologic: intact Heart: RRR + systolic murmur, + rub Lungs: diminished breath sounds bibasilar Abdomen: normal findings: soft, non-tender  Lab Results: Recent Labs    09/22/23 1805 09/23/23 0519  WBC 44.2* 37.5*  HGB 13.3 12.4*  HCT 37.8* 35.4*  PLT 239 210   BMET:  Recent Labs    09/22/23 1805 09/23/23 0519  NA 133* 133*  K 3.4* 4.5  CL 102 100  CO2 21* 21*  GLUCOSE 188* 99  BUN 32* 33*  CREATININE 1.30* 1.33*  CALCIUM  8.4* 8.3*    PT/INR:  Recent Labs    09/22/23 1253  LABPROT 20.4*  INR 1.7*   ABG    Component Value Date/Time   PHART 7.260 (L) 09/22/2023 1758   HCO3 20.2 09/22/2023 1758   TCO2 22 09/22/2023 1758   ACIDBASEDEF 7.0 (H) 09/22/2023 1758   O2SAT 91  09/22/2023 1758   CBG (last 3)  Recent Labs    09/23/23 0222 09/23/23 0402 09/23/23 0605  GLUCAP 120* 117* 120*    Assessment/Plan: S/P Procedure(s) (LRB): REPLACEMENT, AORTIC VALVE, OPEN UTILIZING INSPIRIS RESILIA AORTIC VALVE (N/A) ECHOCARDIOGRAM, TRANSESOPHAGEAL, INTRAOPERATIVE (N/A) POD # 1 NEURO- grossly intact motor  ? Aspiration last night- assess swallowing this AM CV- in Sr with good hemodynamics  LBBB  Wean milrinone, epi, norepi  ASA, beta blocker, statin RESP- IS for atelectasis RENAL- creatinine 1.33 which is near baseline  Diurese ENDO- CBG controlled with high dose insulin   Transition to long acting + SSI when on lower dose GI- diet as tolerated once swallowing cleared SCD + enoxaparin + ambulation for DVT prophylaxis Leukocytosis- likely reactive no apparent source- monitor DC chest tubes  LOS: 7 days    Jonathon Bray 09/23/2023

## 2023-09-23 NOTE — Inpatient Diabetes Management (Signed)
 Inpatient Diabetes Program Recommendations  AACE/ADA: New Consensus Statement on Inpatient Glycemic Control (2015)  Target Ranges:  Prepandial:   less than 140 mg/dL      Peak postprandial:   less than 180 mg/dL (1-2 hours)      Critically ill patients:  140 - 180 mg/dL   Lab Results  Component Value Date   GLUCAP 71 09/23/2023   HGBA1C 8.3 (H) 09/22/2023    Review of Glycemic Control  Diabetes history: DM 2 Outpatient Diabetes medications: Tresiba 54 units Daily, Ozempic 0.5 weekly, Humalog  16 units tid Current orders for Inpatient glycemic control:  IV insulin /Endotool A1c 8.3% on 6/19  Inpatient Diabetes Program Recommendations:    -   High doses of IV insulin  currently to maintain glucose trends -   keep pt on IV insulin  for today possible transition later when gtt per hour is lower  Thanks,  Eloise Hake RN, MSN, BC-ADM Inpatient Diabetes Coordinator Team Pager [phone] (8a-5p)

## 2023-09-23 NOTE — Progress Notes (Signed)
echoc

## 2023-09-23 NOTE — Evaluation (Signed)
 Clinical/Bedside Swallow Evaluation Patient Details  Name: Jonathon Bray MRN: [id number] Date of Birth: 1965/11/11  Today's Date: 09/23/2023 Time: SLP Start Time (ACUTE ONLY): 0806 SLP Stop Time (ACUTE ONLY): 0820 SLP Time Calculation (min) (ACUTE ONLY): 14 min  Past Medical History:  Past Medical History:  Diagnosis Date   Aortic stenosis    Diabetes mellitus without complication (HCC)    type two - LIBRE SYSTEM ON RIGHT UPPER OUTER ARM   Hypertension    PONV (postoperative nausea and vomiting)    Past Surgical History:  Past Surgical History:  Procedure Laterality Date   COLONOSCOPY  2020   CORONARY ANGIOGRAPHY N/A 05/10/2022   Procedure: CORONARY ANGIOGRAPHY;  Surgeon: Knox Perl, MD;  Location: [facility];  Service: Cardiovascular;  Laterality: N/A;   KYPHOPLASTY N/A 06/19/2020   Procedure: ATTEMPTED LUMBAR TWO KYPHOPLASTY;  Surgeon: Audie Bleacher, MD;  Location: [facility] OR;  Service: Neurosurgery;  Laterality: N/A;   RIGHT HEART CATH AND CORONARY ANGIOGRAPHY N/A 06/16/2023   Procedure: RIGHT HEART CATH AND CORONARY ANGIOGRAPHY;  Surgeon: Kyra Phy, MD;  Location: [facility];  Service: Cardiovascular;  Laterality: N/A;   HPI:  Jonathon Bray is a 58 year old man with a history of hypertension, type 2 insulin -dependent diabetes, stage II chronic kidney disease, obesity ascending aortic ectasia, and aortic stenosis s/p open heart surgery for replacement of aortic valve on 09/22/23. Intubated <1 day for surgery. Extubated 6/19.    Assessment / Plan / Recommendation  Clinical Impression   Pt presents with concerns for a pharyngeal dysphagia per clinical swallow assessment completed today. Oral phase judged to be WNL for consistencies tried. Pt presented with mild-moderate hoarseness and reduced cough strength. He reported some mild throat discomfort. Concerns for airway violation included delayed and immediate, weak, nonproductive coughing post small, controlled cup  sips of thin water. No overt or subtle s/s of aspiration noted with ice chips or applesauce.   Discussed recommendation to proceed with instrumental swallow assessment (FEES) prior to diet advancement. Pt in agreement with plan. SLP can return later this morning to complete FEES at bedside. RN updated.   Continue NPO with the exception of ice chips and PO meds in applesauce as tolerated. Recommend Q4 oral care.   Plan: FEES later today to determine safe oral diet advancement as indicated.   SLP Visit Diagnosis: Dysphagia, unspecified (R13.10)    Aspiration Risk  Mild aspiration risk    Diet Recommendation NPO;Ice chips PRN after oral care    Medication Administration: Whole meds with puree Supervision: Patient able to self feed Compensations: Slow rate;Small sips/bites Postural Changes: Seated upright at 90 degrees    Other  Recommendations Oral Care Recommendations: Oral care QID     Assistance Recommended at Discharge    Functional Status Assessment Patient has had a recent decline in their functional status and demonstrates the ability to make significant improvements in function in a reasonable and predictable amount of time.  Frequency and Duration min 2x/week  1 week       Prognosis Prognosis for improved oropharyngeal function: Good      Swallow Study   General Date of Onset: 09/22/23 HPI: Jonathon Bray is a 58 year old man with a history of hypertension, type 2 insulin -dependent diabetes, stage II chronic kidney disease, obesity ascending aortic ectasia, and aortic stenosis s/p open heart surgery for replacement of aortic valve on 09/22/23. Intubated <1 day for surgery. Extubated 6/19. Type of Study: Bedside Swallow Evaluation Previous Swallow Assessment: none  per chart Diet Prior to this Study: NPO Temperature Spikes Noted: No Respiratory Status: Nasal cannula (4L) History of Recent Intubation: Yes Total duration of intubation (days):  (<1day for surgery) Date  extubated: 09/22/23 Behavior/Cognition: Alert;Cooperative Oral Cavity Assessment: Within Functional Limits Oral Cavity - Dentition: Adequate natural dentition Vision: Functional for self-feeding Self-Feeding Abilities: Able to feed self;Needs set up Patient Positioning: Upright in bed Baseline Vocal Quality: Hoarse;Low vocal intensity Volitional Cough:  (fair) Volitional Swallow: Able to elicit    Oral/Motor/Sensory Function Overall Oral Motor/Sensory Function: Within functional limits   Ice Chips Ice chips: Within functional limits Presentation: Spoon   Thin Liquid Thin Liquid: Impaired Presentation: Cup Pharyngeal  Phase Impairments: Cough - Delayed;Cough - Immediate    Nectar Thick Nectar Thick Liquid: Not tested   Honey Thick Honey Thick Liquid: Not tested   Puree Puree: Within functional limits   Solid            Leverette Read 09/23/2023,8:32 AM

## 2023-09-23 NOTE — Op Note (Unsigned)
 NAME: Jonathon Bray, Jonathon Bray MEDICAL RECORD NO: [id number] ACCOUNT NO: [id number] DATE OF BIRTH: [birth year] FACILITY: [facility] LOCATION: [facility] PHYSICIAN: Elspeth BROCKS. Kerrin, MD  Operative Report   DATE OF PROCEDURE: 09/22/2023  PREOPERATIVE DIAGNOSIS:  Critical aortic stenosis with valvular cardiomyopathy.  POSTOPERATIVE DIAGNOSIS:  Critical aortic stenosis with valvular cardiomyopathy.  PROCEDURE:  Median sternotomy, extracorporeal circulation, aortic valve replacement with 23 mm Edwards Inspiris Resilia pericardial tissue valve (model #11500A, serial number [id number]).  SURGEON:  Elspeth BROCKS. Kerrin, MD  ASSISTANT:  Laurel Becket, PA  Experienced assistance was necessary for this case due to surgical complexity.  Laurel Becket assisted with exposure, retraction of delicate tissue, suctioning and suture management.  ANESTHESIA:  General.  FINDINGS:  Transesophageal echocardiography revealed a heavily calcified aortic valve with minimal valve opening, severe left ventricular dysfunction with ejection fraction of 20%, good function of the prosthetic valve post bypass with significantly  improved left ventricular function.  Intraoperative findings of severely stenotic, heavily calcified, grossly disfigured aortic valve bicuspid with fusion of right and non-coronary cusps, oversized left coronary cusp, moderate annular calcification.  CLINICAL NOTE:  The patient is a 58 year old man with known aortic stenosis.  He presented after a syncopal spell.  He was advised to undergo aortic valve replacement.  The indications, risks, benefits, and alternatives were discussed in detail with the  patient.  He accepted the risks and agreed to proceed.  We did discuss the preference for a mechanical valve given his age, but he refused to consider lifelong anticoagulation and opted for a tissue valve.  He understood the possible need for relatively  short-term reintervention.  OPERATIVE NOTE:  The  patient was brought to the preoperative holding area on 09/22/2023.  He was diaphoretic and when the Swan-Ganz catheter was placed, his PA pressures were nearly systemic.  He was taken to the operating room, anesthetized, and  intubated while sitting up.  He then stabilized.  The PA pressures were still markedly elevated and cardiac index was only 1.2.  Dr. Kelly Mace performed transesophageal echocardiography.  Please refer to her separately dictated note for full  details of the procedure.  A Foley catheter was placed.  Intravenous antibiotics were administered.  The chest, abdomen, and legs were prepped and draped in the usual sterile fashion.  A timeout was performed.  A median sternotomy was performed.  Initial hemostasis was obtained.  The sternal retractor was placed and was gradually opened over time.  The patient was fully heparinized.  The pericardium was opened.  The ascending aorta was  of normal caliber with no evidence of atherosclerotic disease.  There was cardiomegaly.  After confirming adequate anticoagulation with ACT measurement, the aorta was cannulated via concentric 2-0 Ethibond pledgeted pursestring sutures.  A dual-stage  venous cannula was placed via a pursestring suture in the right atrial appendage.  Cardiopulmonary bypass was initiated.  Flows were maintained per protocol.  The patient was cooled to 32 degrees Celsius.  A left ventricular vent was placed via  pursestring suture in the right superior pulmonary vein.  A retrograde cardioplegic cannula was placed via pursestring suture in the right atrium and directed into the coronary sinus.  A foam pad was placed in the pericardium to insufflate the heart.  A  temperature probe was placed in the myocardial septum and a cardioplegia cannula was placed in the ascending aorta via a pledgeted suture.  Carbon dioxide was insufflated into the operative field.  The aorta was cross-clamped.  The calculated dose of cardioplegia  using KBC formulation was 1365.  Initial 750 mL of cardioplegia was administered antegrade and there was diastolic arrest.  The remainder of the cardioplegia was administered retrograde.  A transverse aortotomy was made and was extended into the noncoronary sinus.  The valve was heavily calcified.  Essentially no opening.  The right and non-coronary cusp appeared fused, but the entire valve was essentially fused.  The leaflets were  excised.  There was moderate annular calcification that was debrided, taking care to contain all calcific debris.  The coronary ostia were inspected.  They were at 180-degree orientation.  The left sinus of Valsalva annulus was approximately twice as  long as the right or non-coronary, the same size as the combined size of the right and non-coronary.  The 2-0 Ethibond horizontal mattress sutures then were placed circumferentially around the annulus with subannular pledgets.  The valve was prepared per  manufacturer's recommendations.  The sutures were passed through the sewing ring of the valve and the valve was lowered into place.  The sutures were secured with the Cor-Knot device.  The leaflets were opened and the valve was well seated.  Probing the  annulus with a fine tip right angle revealed no gaps.  The coronary ostia were inspected and were free of any impingement, were widely patent.  Rewarming was begun.  The aortotomy then was closed with two layers of running 4-0 Prolene suture.  The first  was a running horizontal mattress suture followed by a running simple suture.  The 60-minute interval for re-dosing cardioplegia was reached during the aortotomy closure, so additional cold cardioplegia was not administered, but a warm dose of  retrograde cardioplegia was administered.  400 mL was given with the patient in Trendelenburg position.  De-airing was performed and the aortic crossclamp was removed.  The total crossclamp time was 71 minutes.  The patient required single  defibrillation with 10 joules.  He then was in bradycardic rhythm.  While rewarming was completed, the retrograde cardioplegia cannula was removed.  The antegrade cannula was left in place for de-airing.  Epicardial pacemaker  was placed on the right ventricle and right atrium.  When the patient had rewarmed to a core temperature of 37 degrees Celsius, he was weaned from cardiopulmonary bypass.  On initial wean, he was on milrinone  at 0.25 mcg per kilogram per minute and  epinephrine  at 3 mcg per minute and also was on a low-dose norepinephrine  infusion.  After ensuring that there were no paravalvular leaks and there was good function of the prosthetic valve, the patient was placed back on bypass briefly.  The left  ventricular vent was removed.  Additional de-airing was performed.  The patient then weaned from bypass without difficulty.  Total bypass time was 103 minutes.  The initial cardiac index was greater than 2 liters per minute per meter squared and the  patient remained hemodynamically stable throughout the post bypass period.  A test dose of protamine  was administered and was well tolerated.  The atrial and aortic cannulae were removed.  The remainder of the protamine  was administered without incident.  The chest was irrigated with warm saline.  Hemostasis was achieved.   28-French chest tubes were placed into the pleural spaces bilaterally.  There were moderate pleural effusions drained at the beginning of the procedure.  A 36-French chest tube was placed into the anterior mediastinum.  The pericardium was reapproximated  over the aorta and base of the heart.  The sternum was closed with a  combination of single and double heavy-gauge stainless steel wires.  The pectoralis fascia, subcutaneous tissue, and skin were closed in standard fashion.  All sponge, needle, and  instrument counts were correct at the end of the procedure.  The patient was transported from the operating room to the surgical  intensive care unit, intubated and in fair condition.    SHW D: 09/22/2023 6:17:20 pm T: 09/23/2023 4:25:00 am  JOB: [id number]/ [id number]

## 2023-09-23 NOTE — Discharge Summary (Signed)
 [address]       [place] [address]             [phone]    Physician Discharge Summary  Patient ID: Jonathon Bray MRN: [id number] DOB/AGE: 58-Apr-1967 58 y.o.  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   Nonrheumatic aortic valve stenosis 05/11/2022   Family history of premature CAD 05/11/2022   Atherosclerosis of aorta (HCC) 05/11/2022   Aortic dilatation (HCC) 05/11/2022   Hyperlipidemia associated with type 2 diabetes mellitus (HCC) 05/11/2022   Benign hypertension 05/11/2022   Non-ST elevation (NSTEMI) myocardial infarction (HCC) 05/10/2022   Hypercholesteremia 05/10/2022   Type 2 diabetes mellitus with complication, with long-term current use of insulin  (HCC) 05/10/2022   NSTEMI (non-ST elevated myocardial infarction) (HCC) 05/10/2022   Abnormal cardiac enzyme level 05/10/2022   Spondylolysis, lumbar region 01/01/2021   Solitary pulmonary nodule on lung CT 04/02/2020   Orthostatic dizziness 01/24/2014   Primary hypertension 01/24/2014   Diabetes mellitus (HCC) 06/01/2012    Discharge Diagnoses:  Patient Active Problem List   Diagnosis Date Noted   S/P aortic valve replacement with bioprosthetic valve 09/22/2023   Critical aortic valve stenosis 09/16/2023   Heart failure (HCC) 09/10/2023   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   Nonrheumatic aortic valve stenosis 05/11/2022   Family history of premature CAD 05/11/2022   Atherosclerosis of aorta (HCC) 05/11/2022   Aortic dilatation (HCC) 05/11/2022   Hyperlipidemia associated with type 2 diabetes mellitus (HCC) 05/11/2022   Benign hypertension 05/11/2022   Non-ST elevation (NSTEMI) myocardial infarction (HCC) 05/10/2022   Hypercholesteremia 05/10/2022   Type 2 diabetes mellitus with  complication, with long-term current use of insulin  (HCC) 05/10/2022   NSTEMI (non-ST elevated myocardial infarction) (HCC) 05/10/2022   Abnormal cardiac enzyme level 05/10/2022   Spondylolysis, lumbar region 01/01/2021   Solitary pulmonary nodule on lung CT 04/02/2020   Orthostatic dizziness 01/24/2014   Primary hypertension 01/24/2014   Diabetes mellitus (HCC) 06/01/2012    Discharged Condition: Stable  HPI: Jonathon Bray is a 58 year old man with a history of hypertension, type 2 insulin -dependent diabetes, stage II chronic kidney disease, acute HFrEF, obesity, ascending aortic ectasia, and aortic stenosis.     He presented in early March after echocardiogram showed progression of aortic stenosis and low EF.  CBGs were uncontrolled and he needed a dental evaluation. Cardiac catheterization showed no CAD.    He was to see dental then follow up in the office but did not follow up. He presented to the ED with a syncopal episode and hypotension. He has seen a dentist and underwent cleaning. HgbA1C is still elevated at 9.0. Echocardiogram on 09/11/23 showed LVEF 20-25%, left ventricle with global hypokinesis, trivial mitral valve regurgitation, severe calcification of the aortic valve with severe aortic valve stenosis, mean gradient measuring , aortic valve area measuring 0.4 and mild aortic valve regurgitation.   He was admitted to [facility]. Cardiothoracic surgery was consulted and Dr. Kerrin reviewed the diagnostic studies and determined the patient would benefit from surgical intervention. He reviewed the patient's treatment options as well as the risks and benefits of surgery. He was agreeable to proceed with surgery.  Hospital Course:  The patient remained stable while in  [facility]. He decided he did not want to be on lifelong anticoagulation  so he opted for a bioprosthetic valve even with risk of early failure. He was brought to the operating room on 09/22/23  and underwent an aortic valve replacement (using an Inspiris, size 23 mm). He tolerated the procedure well and was transferred to the SICU in stable condition. He was extubated the afternoon of surgery. He may have aspirated over night. He was monitored closely. SLP evaluated and found silent aspiration, he was started on nectar thick liquids and mechanical altered diet. His swallowing improved and SLP continued to follow him. Norva Purl, a line, chest tubes and foley were all removed early in his post operative course. He had a LBBB. He was weaned off Milrinone , Epinephrine , and Nor Epinephrine  drips. He was then started on Lopressor . Epicardial pacing wires were removed on 06/21. He was on Lovenox  for DVT prophylaxis. He developed AKI on CKD baseline creatinine 1.2-1.3, creatinine peaked at 1.73. This improved with time. He was restarted on home Torsemide . He was felt stable for transfer to the progressive  unit. He had ST elevation on telemetry but EKG showed this was due to left bundle branch block which remained stable. He continued to have silent aspiration, nectar thick liquids were recommended by SLP although the patient was not adhering to these recommendations. 1 month MBS and outpatient SLP follow up was arranged as discussed with SLP. He was aggressively diuresed due to lower extremity edema and unna boots were placed. He would be discharged with unna boots in place that were to be changed twice per week per [facility] RN. PT/OT recommended [facility] PT/OT which was arranged accordingly. His ambulation was progressing and his bowels were moving appropriately. His incisions were healing well without sign of infection. He was felt stable for discharge home with close follow up in the office in 1 week with a BMET.   Consults: SLP  Significant Diagnostic Studies:  RIGHT HEART CATH AND CORONARY ANGIOGRAPHY   1.  Normal right dominant circulation. 2.  Fick cardiac output of 4.2 L/min Fick cardiac index of 2.1  L/min/m with the following hemodynamics:            Right atrial pressure mean of 19 mmHg            Right ventricular pressure 71/11 with an end-diastolic pressure of 24 mmHg            PA pressure 70/38 with a mean of 52 mmHg            Mean wedge pressure of 33 mmHg V waves to 45 mmHg            PVR 4.5 Woods units            PA pulsatility index of 1.7  ECHOCARDIOGRAM REPORT    Patient Name:   Jonathon Bray Date of Exam: 09/11/2023  Medical Rec #:  [id number]         Height:       65.0 in  Accession #:    [id number]        Weight:       183.0 lb  Date of Birth:  01/26/1966         BSA:          1.905 m  Patient Age:    57 years          BP:           124/98 mmHg  Patient Gender: M  HR:           109 bpm.  Exam Location:  Inpatient   Procedure: 2D Echo, Cardiac Doppler and Color Doppler (Both Spectral and  Color            Flow Doppler were utilized during procedure).   Indications:    CHF    History:        Patient has prior history of Echocardiogram examinations,  most                 recent 06/02/2023. Previous Myocardial Infarction; Risk                  Factors:Diabetes, Dyslipidemia and Non-Smoker.    Sonographer:    Orvil Holmes RDCS  Referring Phys: [id number] NKIRU C OSUDE   IMPRESSIONS    1. Left ventricular ejection fraction, by estimation, is 20 to 25%. The  left ventricle has severely decreased function. The left ventricle  demonstrates global hypokinesis, septal motion consistent with LBBB and  more preserved contraction in basal  posterior wall. There is mild concentric left ventricular hypertrophy.  Left ventricular diastolic parameters are indeterminate.   2. Right ventricular systolic function is normal. The right ventricular  size is normal. Tricuspid regurgitation signal is inadequate for assessing  PA pressure.   3. The mitral valve is grossly normal. Trivial mitral valve  regurgitation.   4. The aortic valve has an indeterminant  number of cusps. There is severe  calcifcation of the aortic valve. Aortic valve regurgitation is mild.  Severe aortic valve stenosis, low flow/low gradient. Aortic valve mean  gradient measures 31.0 mmHg.  Dimentionless index 0.16.   5. Unable to estimate CVP.   Comparison(s): Prior images reviewed side by side. LVEF has decreased  further, 20-25% range. Severe aortic stenosis.   FINDINGS   Left Ventricle: Left ventricular ejection fraction, by estimation, is 20  to 25%. The left ventricle has severely decreased function. The left  ventricle demonstrates global hypokinesis. The left ventricular internal  cavity size was normal in size. There  is mild concentric left ventricular hypertrophy. Abnormal (paradoxical)  septal motion, consistent with left bundle branch block. Left ventricular  diastolic parameters are indeterminate.   Right Ventricle: The right ventricular size is normal. No increase in  right ventricular wall thickness. Right ventricular systolic function is  normal. Tricuspid regurgitation signal is inadequate for assessing PA  pressure.   Left Atrium: Left atrial size was normal in size.   Right Atrium: Right atrial size was normal in size.   Pericardium: There is no evidence of pericardial effusion.   Mitral Valve: The mitral valve is grossly normal. Trivial mitral valve  regurgitation.   Tricuspid Valve: The tricuspid valve is grossly normal. Tricuspid valve  regurgitation is trivial.   Aortic Valve: The aortic valve has an indeterminant number of cusps. There  is severe calcifcation of the aortic valve. There is mild to moderate  aortic valve annular calcification. Aortic valve regurgitation is mild.  Severe aortic stenosis is present.  Aortic valve mean gradient measures 31.0 mmHg. Aortic valve peak gradient  measures 40.7 mmHg. Aortic valve area, by VTI measures 0.40 cm.   Pulmonic Valve: The pulmonic valve was grossly normal. Pulmonic valve   regurgitation is trivial.   Aorta: The aortic root is normal in size and structure.   Venous: Unable to estimate CVP. The inferior vena cava was not well  visualized.   IAS/Shunts: The interatrial septum was not well visualized.  Additional Comments: 3D was performed not requiring image post processing  on an independent workstation and was indeterminate.     LEFT VENTRICLE  PLAX 2D  LVIDd:         4.70 cm   Diastology  LV PW:         1.50 cm   LV e' medial:    5.00 cm/s  LV IVS:        1.10 cm   LV E/e' medial:  31.2  LVOT diam:     1.80 cm   LV e' lateral:   11.60 cm/s  LV SV:         29        LV E/e' lateral: 13.4  LV SV Index:   15  LVOT Area:     2.54 cm     RIGHT VENTRICLE  RV S prime:     9.70 cm/s  TAPSE (M-mode): 2.0 cm   LEFT ATRIUM             Index  LA diam:        3.70 cm 1.94 cm/m  LA Vol (A2C):   45.9 ml 24.10 ml/m  LA Vol (A4C):   37.9 ml 19.90 ml/m  LA Biplane Vol: 46.1 ml 24.20 ml/m   AORTIC VALVE  AV Area (Vmax):    0.44 cm  AV Area (Vmean):   0.37 cm  AV Area (VTI):     0.40 cm  AV Vmax:           319.00 cm/s  AV Vmean:          276.000 cm/s  AV VTI:            0.717 m  AV Peak Grad:      40.7 mmHg  AV Mean Grad:      31.0 mmHg  LVOT Vmax:         55.30 cm/s  LVOT Vmean:        39.900 cm/s  LVOT VTI:          0.112 m  LVOT/AV VTI ratio: 0.16    AORTA  Ao Root diam: 2.70 cm  Ao Asc diam:  3.60 cm   MITRAL VALVE  MV Area (PHT): 7.66 cm     SHUNTS  MV Decel Time: 99 msec      Systemic VTI:  0.11 m  MV E velocity: 156.00 cm/s  Systemic Diam: 1.80 cm  MV A velocity: 51.90 cm/s  MV E/A ratio:  3.01   Jayson Sierras MD  Electronically signed by Jayson Sierras MD  Signature Date/Time: 09/11/2023/3:36:46 PM      Final     Treatments: surgery:   Operative Report    DATE OF PROCEDURE: 09/22/2023   PREOPERATIVE DIAGNOSIS:  Critical aortic stenosis with valvular cardiomyopathy.   POSTOPERATIVE DIAGNOSIS:  Critical aortic  stenosis with valvular cardiomyopathy.   PROCEDURE:  Median sternotomy, extracorporeal circulation, aortic valve replacement with 23 mm Edwards Inspiris Resilia pericardial tissue valve (model #[id number], serial number [id number]).   SURGEON:  Elspeth BROCKS. Kerrin, MD   Discharge Exam: Blood pressure 117/74, pulse 72, temperature 97.7 F (36.5 C), temperature source Oral, resp. rate 14, height 5' 5 (1.651 m), weight 95.1 kg, SpO2 94%. General appearance: alert, cooperative, and no distress Neurologic: intact Heart: regular rate and rhythm, S1, S2 normal, no murmur, click, rub or gallop Lungs: clear to auscultation bilaterally Abdomen: soft, non-tender; bowel sounds normal; no masses,  no organomegaly Extremities:  edema 2+ BLE, blisters on both shins, no acute erythema, not warm to touch or sign of cellulitis Wound: Clean and dry sternal incision without sign of infection  Discharge Medications:  The patient has been discharged on:   1.Beta Blocker:  Yes [ X  ]                              No   [   ]                              If No, reason:  2.Ace Inhibitor/ARB: Yes [   ]                                     No  [   X ]                                     If No, reason: Hypotension  3.Statin:   Yes [ X  ]                  No  [   ]                  If No, reason:  4.Ecasa:  Yes  [  X ]                  No   [   ]                  If No, reason:  Patient had ACS upon admission: No  Plavix/P2Y12 inhibitor: Yes [   ]                                      No  [ X ]     Discharge Instructions     Amb Referral to Cardiac Rehabilitation   Complete by: As directed    Diagnosis: Valve Replacement   Valve: Aortic   After initial evaluation and assessments completed: Virtual Based Care may be provided alone or in conjunction with Phase 2 Cardiac Rehab based on patient barriers.: Yes   Intensive Cardiac Rehabilitation (ICR) [facility] location only OR Traditional Cardiac Rehabilitation  (TCR) *If criteria for ICR are not met will enroll in TCR ([facility] only): Yes      Allergies as of 09/28/2023       Reactions   Invokana [canagliflozin] Other (See Comments)   Dizziness Syncope    Levaquin [levofloxacin] Rash   Zestril  [lisinopril ] Cough   Farxiga  [dapagliflozin ] Other (See Comments)   dizziness        Medication List     STOP taking these medications    HumuLIN  N KwikPen 100 UNIT/ML KwikPen Generic drug: Insulin  NPH (Human) (Isophane)       TAKE these medications    acetaminophen  325 MG tablet Commonly known as: Tylenol  Take 2 tablets (650 mg total) by mouth every 6 (six) hours as needed for mild pain (pain score 1-3).   aspirin  EC 325 MG tablet Take 1 tablet (325 mg total) by mouth daily.   atorvastatin  40 MG tablet Commonly  known as: LIPITOR Take 1 tablet (40 mg total) by mouth at bedtime.   gabapentin  100 MG capsule Commonly known as: NEURONTIN  Take 2 capsules (200 mg total) by mouth 2 (two) times daily. If you feel you no longer require this medication for pain please DO NOT stop abruptly. Please wean to 1 tablet daily for 1 week and then discontinue.   insulin  lispro 100 UNIT/ML KwikPen Commonly known as: HUMALOG  Inject 16 Units into the skin 3 (three) times daily.   metoprolol  succinate 25 MG 24 hr tablet Commonly known as: Toprol  XL Take 1 tablet (25 mg total) by mouth daily. Start taking on: September 29, 2023   oxyCODONE  5 MG immediate release tablet Commonly known as: Oxy IR/ROXICODONE  Take 1 tablet (5 mg total) by mouth every 6 (six) hours as needed for severe pain (pain score 7-10).   Ozempic (0.25 or 0.5 MG/DOSE) 2 MG/3ML Sopn Generic drug: Semaglutide(0.25 or 0.5MG /DOS) Inject 0.25 mg into the skin once a week.   torsemide  20 MG tablet Commonly known as: DEMADEX  Take 1 tablet (20 mg total) by mouth 2 (two) times daily.   Tresiba FlexTouch 200 UNIT/ML FlexTouch Pen Generic drug: insulin  degludec Inject 54 Units into the skin  daily.        Follow-up Information     Kerrin Elspeth BROCKS, MD Follow up on 11/01/2023.   Specialty: Cardiothoracic Surgery Why: Follow up appointment is at 11:45PM, please get a chest xray at 10:45AM on the 2nd floor of our building Contact information: [address] [place] [address] [phone]         Madie Jon Garre, PA. Go on 10/12/2023.   Specialties: Cardiology, Radiology Why: Appointment time is at 2:20 pm Contact information: [address] [place] [address] [phone]         [facility] CV Img Echo at [facility] Dept. of [place]. [facility] [employer]. Go on 11/08/2023.   Specialty: Cardiology Why: Appointment time is 7:20 am Contact information: [address] [place]  [address] [phone]        Michele Richardson, DO. Go on 10/27/2023.   Specialties: Cardiology, Vascular Surgery Why: Appointment time is at 4:00 pm Contact information: [address] [place] [address] [phone]         [facility] at [facility] Dept. of [facility] [place] Follow up on 10/05/2023.   Specialty: Cardiothoracic Surgery Why: Appointment is at 12:00PM, please get labs drawn on the first floor of our office building on July 2nd, 1 hour prior to your appointment. Contact information: [address] [place]  [address] [phone]                Signed:  Con GORMAN Bend, PA-C 09/28/2023, 1:23 PM

## 2023-09-23 NOTE — Progress Notes (Signed)
 Orange juice 4 oz nectar thick given. Blood glucose 68. Patient states no symptoms.  Discussed with pharmacist to confirm protocol.

## 2023-09-23 NOTE — Procedures (Addendum)
 Objective Swallowing Evaluation: Type of Study: FEES-Fiberoptic Endoscopic Evaluation of Swallow   Patient Details  Name: Jonathon Bray MRN: [id number] Date of Birth: 23-Jul-1965  Today's Date: 09/23/2023 Time: SLP Start Time (ACUTE ONLY): 1028 -SLP Stop Time (ACUTE ONLY): 1106  SLP Time Calculation (min) (ACUTE ONLY): 38 min   Past Medical History:  Past Medical History:  Diagnosis Date   Aortic stenosis    Diabetes mellitus without complication (HCC)    type two - LIBRE SYSTEM ON RIGHT UPPER OUTER ARM   Hypertension    PONV (postoperative nausea and vomiting)    Past Surgical History:  Past Surgical History:  Procedure Laterality Date   AORTIC VALVE REPLACEMENT N/A 09/22/2023   Procedure: REPLACEMENT, AORTIC VALVE, OPEN UTILIZING INSPIRIS RESILIA AORTIC VALVE;  Surgeon: [name], MD;  Location: [facility] OR;  Service: Open Heart Surgery;  Laterality: N/A;   COLONOSCOPY  2020   CORONARY ANGIOGRAPHY N/A 05/10/2022   Procedure: CORONARY ANGIOGRAPHY;  Surgeon: Knox Perl, MD;  Location: [facility] INVASIVE CV LAB;  Service: Cardiovascular;  Laterality: N/A;   INTRAOPERATIVE TRANSESOPHAGEAL ECHOCARDIOGRAM N/A 09/22/2023   Procedure: ECHOCARDIOGRAM, TRANSESOPHAGEAL, INTRAOPERATIVE;  Surgeon: [name], MD;  Location: [facility] OR;  Service: Open Heart Surgery;  Laterality: N/A;   KYPHOPLASTY N/A 06/19/2020   Procedure: ATTEMPTED LUMBAR TWO KYPHOPLASTY;  Surgeon: Audie Bleacher, MD;  Location: [facility] OR;  Service: Neurosurgery;  Laterality: N/A;   RIGHT HEART CATH AND CORONARY ANGIOGRAPHY N/A 06/16/2023   Procedure: RIGHT HEART CATH AND CORONARY ANGIOGRAPHY;  Surgeon: Kyra Phy, MD;  Location: [facility] INVASIVE CV LAB;  Service: Cardiovascular;  Laterality: N/A;   HPI: Jonathon Bray is a 58 year old man with a history of hypertension, type 2 insulin -dependent diabetes, stage II chronic kidney disease, obesity ascending aortic ectasia, and aortic stenosis s/p open heart surgery for  replacement of aortic valve on 09/22/23. Intubated <1 day for surgery. Extubated 6/19.   Subjective: Pt seen for FEES. RN at bedside to assist with feeding during the procedure. Consent obtained per pt.    Assessment / Plan / Recommendation Transnasal endoscope passed through the R naris without incidence. Pt tolerated the procedure well. No significant amount of secretions observed in the pharyngeal and laryngeal spaces. Vocal cord adduction appeared complete. No evidence of anatomical abnormalities observed.   Pt presents with a mild oral and a mild-moderate pharyngeal dysphagia per FEES completed today.  Oral deficits characterized by generalized reduced oral strength and coordination resulting in increased oral prep and transit time of regular solid. There was x1 instance of posterior spillage of nectar-thick liquids observed to the level of the pyriform sinuses before the swallow.   Pharyngeal deficits characterized reduced hyolaryngeal elevation/excursion, reduced base of tongue retraction, reduced laryngeal vestibule closure, and reduced pharyngeal stripping.   Findings:  -There was trace deep penetration (PAS-5) and delayed aspiration (PAS7/8) of ice chips and thin liquids by cup sip during/after the swallow. Pt's sensitivity to thin liquid aspiration was inconsistent with occasional, subtle throat clear or no response indicating silent aspiration.  -There was x1 instance of deep penetration with spontaneous ejection (PAS-4) of applesauce residue after the initial swallow. This trial included applesauce + whole meds given by RN. Recommend crushing meds in applesauce due to this finding.  -No penetration or aspiration of nectar-thick liquids or regular solid observed.  -There was generalized min diffuse pharyngeal residue following majority of trials and was reduced with subsequent dry swallows or liquid wash for solid residue.   Education: SLP  reviewed FEES images, findings, and diet  recommendations in detail following study. Discussed aspiration precautions and reviewed compensatory swallow strategies and respiratory strengthening exercises. Pt verbalized understanding, though anticipate follow up review will be helpful. He will benefit from SLP guidance and support to complete respiratory strengthening exercises + further pharyngeal strengthening exercises.  Recommend a mechanical altered diet and nectar-thick liquids (no straw) as tolerated. Recommend PO meds crushed in applesauce.   Plan: SLP will follow up to assess diet tolerance and modify diet as indicated. Pt will need a repeat FEES or MBSS prior to liquid advancement given inconsistent silent aspiration of thin liquids.      09/23/2023   12:18 PM  Clinical Impressions  Clinical Impression See above  SLP Visit Diagnosis Dysphagia, oropharyngeal phase (R13.12)  Attention and concentration deficit following --  Frontal lobe and executive function deficit following --  Impact on safety and function Moderate aspiration risk         09/23/2023   12:18 PM  Treatment Recommendations  Treatment Recommendations Therapy as outlined in treatment plan below        09/23/2023   12:21 PM  Prognosis  Prognosis for improved oropharyngeal function Fair  Barriers to Reach Goals --  Barriers/Prognosis Comment --       09/23/2023   12:18 PM  Diet Recommendations  SLP Diet Recommendations No mixed consistencies;Dysphagia 2 (Fine chop) solids;Nectar thick liquid  Liquid Administration via Cup;No straw  Medication Administration Crushed with puree  Compensations Slow rate;Small sips/bites;Multiple dry swallows after each bite/sip  Postural Changes Seated upright at 90 degrees         09/23/2023   12:18 PM  Other Recommendations  Recommended Consults --  Oral Care Recommendations Oral care BID  Caregiver Recommendations --  Follow Up Recommendations TBD  Assistance recommended at discharge Defer to PT/OT  recommendations   Functional Status Assessment Patient has had a recent decline in their functional status and demonstrates the ability to make significant improvements in function in a reasonable and predictable amount of time.       09/23/2023   12:18 PM  Frequency and Duration   Speech Therapy Frequency (ACUTE ONLY) min 2x/week  Treatment Duration 3 weeks

## 2023-09-24 ENCOUNTER — Inpatient Hospital Stay (HOSPITAL_COMMUNITY)

## 2023-09-24 LAB — GLUCOSE, CAPILLARY
Glucose-Capillary: 141 mg/dL — ABNORMAL HIGH (ref 70–99)
Glucose-Capillary: 104 mg/dL — ABNORMAL HIGH (ref 70–99)
Glucose-Capillary: 68 mg/dL — ABNORMAL LOW (ref 70–99)
Glucose-Capillary: 72 mg/dL (ref 70–99)
Glucose-Capillary: 248 mg/dL — ABNORMAL HIGH (ref 70–99)
Glucose-Capillary: 239 mg/dL — ABNORMAL HIGH (ref 70–99)
Glucose-Capillary: 161 mg/dL — ABNORMAL HIGH (ref 70–99)
Glucose-Capillary: 239 mg/dL — ABNORMAL HIGH (ref 70–99)

## 2023-09-24 LAB — BASIC METABOLIC PANEL WITH GFR
Anion gap: 10 (ref 5–15)
BUN: 46 mg/dL — ABNORMAL HIGH (ref 6–20)
CO2: 21 mmol/L — ABNORMAL LOW (ref 22–32)
Calcium: 8.4 mg/dL — ABNORMAL LOW (ref 8.9–10.3)
Chloride: 99 mmol/L (ref 98–111)
Creatinine, Ser: 1.55 mg/dL — ABNORMAL HIGH (ref 0.61–1.24)
GFR, Estimated: 52 mL/min — ABNORMAL LOW (ref >60)
Glucose, Bld: 222 mg/dL — ABNORMAL HIGH (ref 70–99)
Potassium: 5.9 mmol/L — ABNORMAL HIGH (ref 3.5–5.1)
Sodium: 130 mmol/L — ABNORMAL LOW (ref 135–145)

## 2023-09-24 LAB — CBC
HCT: 34.5 % — ABNORMAL LOW (ref 39.0–52.0)
Hemoglobin: 12 g/dL — ABNORMAL LOW (ref 13.0–17.0)
MCH: 29.4 pg (ref 26.0–34.0)
MCHC: 34.8 g/dL (ref 30.0–36.0)
MCV: 84.6 fL (ref 80.0–100.0)
Platelets: 164 10*3/uL (ref 150–400)
RBC: 4.08 MIL/uL — ABNORMAL LOW (ref 4.22–5.81)
RDW: 13.1 % (ref 11.5–15.5)
WBC: 26 10*3/uL — ABNORMAL HIGH (ref 4.0–10.5)
nRBC: 0 % (ref 0.0–0.2)

## 2023-09-24 MED ORDER — INSULIN ASPART 100 UNIT/ML IJ SOLN
10.0000 [IU] | Freq: Three times a day (TID) | INTRAMUSCULAR | Status: DC
  Administered 2023-09-24 (×2): 10 [IU] via SUBCUTANEOUS

## 2023-09-24 MED ORDER — FUROSEMIDE 10 MG/ML IJ SOLN
20.0000 mg | Freq: Once | INTRAMUSCULAR | Status: AC
  Administered 2023-09-24: 20 mg via INTRAVENOUS

## 2023-09-24 MED ORDER — INSULIN GLARGINE-YFGN 100 UNIT/ML ~~LOC~~ SOLN
20.0000 [IU] | Freq: Two times a day (BID) | SUBCUTANEOUS | Status: DC
  Administered 2023-09-24 – 2023-09-28 (×7): 20 [IU] via SUBCUTANEOUS
  Filled 2023-09-24 (×11): qty 0.2

## 2023-09-24 NOTE — Progress Notes (Signed)
 Up to chair at bedside. Appears unsteady on his feet.  Requires 2 people to assist. Ambulated 10 ft.

## 2023-09-24 NOTE — Plan of Care (Signed)
  Problem: Elimination: Goal: Will not experience complications related to bowel motility Outcome: Progressing   Problem: Pain Managment: Goal: General experience of comfort will improve and/or be controlled Outcome: Progressing   Problem: Skin Integrity: Goal: Risk for impaired skin integrity will decrease Outcome: Progressing   Problem: Fluid Volume: Goal: Ability to maintain a balanced intake and output will improve Outcome: Progressing

## 2023-09-24 NOTE — Progress Notes (Signed)
 Patient requests to go back to bed.  Awake and alert. Appears less drowsy than at beginning of shift. Remains unsteady on his feet. Requires 2 people to assist to bed. Able to stand and step but very wobbly.

## 2023-09-24 NOTE — Progress Notes (Signed)
   [address], [place] [address]             [phone]    POD # 2  Going for a walk  BP 97/73   Pulse 80   Temp (P) 97.6 F (36.4 C) (Axillary)   Resp (!) 24   Ht 5' 5 (1.651 m)   Wt 95.7 kg   SpO2 92%   BMI 35.11 kg/m    Intake/Output Summary (Last 24 hours) at 09/24/2023 1815 Last data filed at 09/24/2023 1716 Gross per 24 hour  Intake 911.87 ml  Output 1030 ml  Net -118.13 ml   CBG elevated this AM, better this afternoon  Elspeth C. Kerrin, MD [facility] [phone]

## 2023-09-24 NOTE — Progress Notes (Signed)
 2 Days Post-Op Procedure(s) (LRB): REPLACEMENT, AORTIC VALVE, OPEN UTILIZING INSPIRIS RESILIA AORTIC VALVE (N/A) ECHOCARDIOGRAM, TRANSESOPHAGEAL, INTRAOPERATIVE (N/A) Subjective: Some incisional pain Just walked around small circle  Objective: Vital signs in last 24 hours: Temp:  [97.8 F (36.6 C)-98.7 F (37.1 C)] 97.9 F (36.6 C) (06/21 0700) Pulse Rate:  [78-95] 90 (06/21 0909) Cardiac Rhythm: Normal sinus rhythm (06/21 0800) Resp:  [0-37] 18 (06/21 0909) BP: (83-126)/(52-76) 115/71 (06/21 0800) SpO2:  [83 %-100 %] 91 % (06/21 0909) Arterial Line BP: (81-340)/(49-332) 123/65 (06/21 0800) Weight:  [95.7 kg] 95.7 kg (06/21 0500)  Hemodynamic parameters for last 24 hours:    Intake/Output from previous day: 06/20 0701 - 06/21 0700 In: 923.6 [P.O.:240; I.V.:383.6; IV Piggyback:300] Out: 1000 [Urine:890; Chest Tube:110] Intake/Output this shift: Total I/O In: -  Out: 75 [Urine:75]  General appearance: alert, cooperative, and no distress Neurologic: intact Heart: regular rate and rhythm Lungs: diminished breath sounds bibasilar Abdomen: normal findings: soft, non-tender  Lab Results: Recent Labs    09/23/23 1644 09/24/23 0428  WBC 31.1* 26.0*  HGB 12.0* 12.0*  HCT 34.1* 34.5*  PLT 166 164   BMET:  Recent Labs    09/23/23 1644 09/24/23 0428  NA 131* 130*  K 5.4* 5.9*  CL 101 99  CO2 24 21*  GLUCOSE 80 222*  BUN 39* 46*  CREATININE 1.54* 1.55*  CALCIUM  8.3* 8.4*    PT/INR:  Recent Labs    09/22/23 1253  LABPROT 20.4*  INR 1.7*   ABG    Component Value Date/Time   PHART 7.260 (L) 09/22/2023 1758   HCO3 20.2 09/22/2023 1758   TCO2 22 09/22/2023 1758   ACIDBASEDEF 7.0 (H) 09/22/2023 1758   O2SAT 91 09/22/2023 1758   CBG (last 3)  Recent Labs    09/23/23 2043 09/23/23 2117 09/24/23 0800  GLUCAP 68* 72 248*    Assessment/Plan: S/P Procedure(s) (LRB): REPLACEMENT, AORTIC VALVE, OPEN UTILIZING INSPIRIS RESILIA AORTIC VALVE  (N/A) ECHOCARDIOGRAM, TRANSESOPHAGEAL, INTRAOPERATIVE (N/A) POD #2 NEURO- intact CV- in SR, BP ok  ASA, beta blocker, statin  Dc pacing wires RESP- continue IS for atelectasis RENAL- creatinine stable at 1.55  K elevated  Diurese ENDO- CBG variable  Decrease Sem Glee add meal coverage in addition to SSI GI- tolerating diet SCD + enoxaparin  for DVT prophylaxis Continue cardiac rehab  LOS: 8 days    Elspeth JAYSON Millers 09/24/2023

## 2023-09-25 ENCOUNTER — Inpatient Hospital Stay (HOSPITAL_COMMUNITY)

## 2023-09-25 LAB — BASIC METABOLIC PANEL WITH GFR
Anion gap: 9 (ref 5–15)
BUN: 58 mg/dL — ABNORMAL HIGH (ref 6–20)
CO2: 24 mmol/L (ref 22–32)
Calcium: 8.2 mg/dL — ABNORMAL LOW (ref 8.9–10.3)
Chloride: 98 mmol/L (ref 98–111)
Creatinine, Ser: 1.73 mg/dL — ABNORMAL HIGH (ref 0.61–1.24)
GFR, Estimated: 45 mL/min — ABNORMAL LOW (ref >60)
Glucose, Bld: 129 mg/dL — ABNORMAL HIGH (ref 70–99)
Potassium: 5.3 mmol/L — ABNORMAL HIGH (ref 3.5–5.1)
Sodium: 131 mmol/L — ABNORMAL LOW (ref 135–145)

## 2023-09-25 LAB — CBC
HCT: 33.4 % — ABNORMAL LOW (ref 39.0–52.0)
Hemoglobin: 11.3 g/dL — ABNORMAL LOW (ref 13.0–17.0)
MCH: 28.8 pg (ref 26.0–34.0)
MCHC: 33.8 g/dL (ref 30.0–36.0)
MCV: 85 fL (ref 80.0–100.0)
Platelets: 197 10*3/uL (ref 150–400)
RBC: 3.93 MIL/uL — ABNORMAL LOW (ref 4.22–5.81)
RDW: 12.8 % (ref 11.5–15.5)
WBC: 17 10*3/uL — ABNORMAL HIGH (ref 4.0–10.5)
nRBC: 0 % (ref 0.0–0.2)

## 2023-09-25 LAB — GLUCOSE, CAPILLARY
Glucose-Capillary: 100 mg/dL — ABNORMAL HIGH (ref 70–99)
Glucose-Capillary: 164 mg/dL — ABNORMAL HIGH (ref 70–99)

## 2023-09-25 MED ORDER — SODIUM CHLORIDE 0.9% FLUSH: 3.0000 mL | INTRAVENOUS | Status: DC | PRN

## 2023-09-25 MED ORDER — ASPIRIN 81 MG PO CHEW
324.0000 mg | CHEWABLE_TABLET | Freq: Every day | ORAL | Status: DC
  Administered 2023-09-26 – 2023-09-28 (×3): 324 mg via ORAL
  Filled 2023-09-25 (×3): qty 4

## 2023-09-25 MED ORDER — ZOLPIDEM TARTRATE 5 MG PO TABS
5.0000 mg | ORAL_TABLET | Freq: Every evening | ORAL | Status: DC | PRN
  Administered 2023-09-27: 5 mg via ORAL
  Filled 2023-09-25: qty 1

## 2023-09-25 MED ORDER — SODIUM CHLORIDE 0.9 % IV SOLN
250.0000 mL | INTRAVENOUS | Status: AC | PRN
Start: 2023-09-25 — End: 2023-09-26

## 2023-09-25 MED ORDER — ~~LOC~~ CARDIAC SURGERY, PATIENT & FAMILY EDUCATION: Freq: Once | Status: DC

## 2023-09-25 MED ORDER — METOPROLOL SUCCINATE ER 25 MG PO TB24
25.0000 mg | ORAL_TABLET | Freq: Every day | ORAL | Status: DC
  Administered 2023-09-25 – 2023-09-28 (×4): 25 mg via ORAL
  Filled 2023-09-25 (×4): qty 1

## 2023-09-25 MED ORDER — SODIUM CHLORIDE 0.9% FLUSH
3.0000 mL | Freq: Two times a day (BID) | INTRAVENOUS | Status: DC
  Administered 2023-09-25 – 2023-09-28 (×4): 3 mL via INTRAVENOUS

## 2023-09-25 MED ORDER — INSULIN ASPART 100 UNIT/ML IJ SOLN
15.0000 [IU] | Freq: Three times a day (TID) | INTRAMUSCULAR | Status: DC
  Administered 2023-09-25 – 2023-09-28 (×7): 15 [IU] via SUBCUTANEOUS

## 2023-09-25 MED ORDER — TORSEMIDE 20 MG PO TABS
20.0000 mg | ORAL_TABLET | Freq: Two times a day (BID) | ORAL | Status: DC
  Administered 2023-09-25 – 2023-09-26 (×4): 20 mg via ORAL
  Filled 2023-09-25 (×4): qty 1

## 2023-09-25 MED ORDER — ALUM & MAG HYDROXIDE-SIMETH 200-200-20 MG/5ML PO SUSP: 15.0000 mL | Freq: Four times a day (QID) | ORAL | Status: DC | PRN

## 2023-09-25 NOTE — Plan of Care (Signed)
  Problem: Education: Goal: Knowledge of General Education information will improve Description: Including pain rating scale, medication(s)/side effects and non-pharmacologic comfort measures Outcome: Progressing   Problem: Health Behavior/Discharge Planning: Goal: Ability to manage health-related needs will improve Outcome: Progressing   Problem: Clinical Measurements: Goal: Ability to maintain clinical measurements within normal limits will improve Outcome: Progressing Goal: Will remain free from infection Outcome: Progressing Goal: Diagnostic test results will improve Outcome: Progressing Goal: Respiratory complications will improve Outcome: Progressing Goal: Cardiovascular complication will be avoided Outcome: Progressing   Problem: Activity: Goal: Risk for activity intolerance will decrease Outcome: Progressing   Problem: Nutrition: Goal: Adequate nutrition will be maintained Outcome: Progressing   Problem: Elimination: Goal: Will not experience complications related to bowel motility Outcome: Progressing Goal: Will not experience complications related to urinary retention Outcome: Progressing   Problem: Pain Managment: Goal: General experience of comfort will improve and/or be controlled Outcome: Progressing   Problem: Safety: Goal: Ability to remain free from injury will improve Outcome: Progressing

## 2023-09-25 NOTE — Progress Notes (Signed)
   [address] [place] [address]             [phone]    No issues   BP 121/73   Pulse 83   Temp 98.1 F (36.7 C) (Oral)   Resp (!) 26   Ht 5' 5 (1.651 m)   Wt 95.3 kg   SpO2 96%   BMI 34.96 kg/m    Intake/Output Summary (Last 24 hours) at 09/25/2023 1837 Last data filed at 09/25/2023 1200 Gross per 24 hour  Intake 240 ml  Output --  Net 240 ml    Awaiting bed on 4E  Joely Losier C. Kerrin, MD [facility] [phone]

## 2023-09-25 NOTE — Progress Notes (Signed)
 3 Days Post-Op Procedure(s) (LRB): REPLACEMENT, AORTIC VALVE, OPEN UTILIZING INSPIRIS RESILIA AORTIC VALVE (N/A) ECHOCARDIOGRAM, TRANSESOPHAGEAL, INTRAOPERATIVE (N/A) Subjective: No complaints this AM, just walked around unit  Objective: Vital signs in last 24 hours: Temp:  [97.5 F (36.4 C)-98.2 F (36.8 C)] 97.5 F (36.4 C) (06/22 0400) Pulse Rate:  [79-90] 90 (06/22 0800) Cardiac Rhythm: Normal sinus rhythm (06/21 2100) Resp:  [17-48] 24 (06/22 0800) BP: (83-117)/(60-86) 106/72 (06/22 0800) SpO2:  [90 %-100 %] 95 % (06/22 0800) Weight:  [95.3 kg] 95.3 kg (06/22 0500)  Hemodynamic parameters for last 24 hours:    Intake/Output from previous day: 06/21 0701 - 06/22 0700 In: 480 [P.O.:480] Out: 525 [Urine:525] Intake/Output this shift: No intake/output data recorded.  General appearance: alert, cooperative, and no distress Neurologic: intact Heart: regular rate and rhythm Lungs: diminished breath sounds bibasilar Abdomen: normal findings: soft, non-tender  Lab Results: Recent Labs    09/24/23 0428 09/25/23 0238  WBC 26.0* 17.0*  HGB 12.0* 11.3*  HCT 34.5* 33.4*  PLT 164 197   BMET:  Recent Labs    09/24/23 0428 09/25/23 0238  NA 130* 131*  K 5.9* 5.3*  CL 99 98  CO2 21* 24  GLUCOSE 222* 129*  BUN 46* 58*  CREATININE 1.55* 1.73*  CALCIUM  8.4* 8.2*    PT/INR:  Recent Labs    09/22/23 1253  LABPROT 20.4*  INR 1.7*   ABG    Component Value Date/Time   PHART 7.260 (L) 09/22/2023 1758   HCO3 20.2 09/22/2023 1758   TCO2 22 09/22/2023 1758   ACIDBASEDEF 7.0 (H) 09/22/2023 1758   O2SAT 91 09/22/2023 1758   CBG (last 3)  Recent Labs    09/24/23 1617 09/24/23 2050 09/25/23 0616  GLUCAP 161* 239* 100*    Assessment/Plan: S/P Procedure(s) (LRB): REPLACEMENT, AORTIC VALVE, OPEN UTILIZING INSPIRIS RESILIA AORTIC VALVE (N/A) ECHOCARDIOGRAM, TRANSESOPHAGEAL, INTRAOPERATIVE (N/A) POD # 3 looks good NEURO- intact CV- in SR   On  metoprolol   ASA, no coumadin with tissue valve RESP- atelectasis + small effusion on CXR  IS, diuresis RENAL- creatinine up sightly at 1.73  Monitor  Resume home torsemide  ENDO- CBG elevated yesterday  Continue SemGlee  +SSI  Increase meal coverage GI- tolerating diet Leukocytosis- reactive, continues to improve Transfer to 4E   LOS: 9 days    Jonathon Bray Millers 09/25/2023

## 2023-09-26 ENCOUNTER — Inpatient Hospital Stay (HOSPITAL_COMMUNITY)

## 2023-09-26 LAB — GLUCOSE, CAPILLARY
Glucose-Capillary: 183 mg/dL — ABNORMAL HIGH (ref 70–99)
Glucose-Capillary: 141 mg/dL — ABNORMAL HIGH (ref 70–99)
Glucose-Capillary: 139 mg/dL — ABNORMAL HIGH (ref 70–99)
Glucose-Capillary: 155 mg/dL — ABNORMAL HIGH (ref 70–99)
Glucose-Capillary: 116 mg/dL — ABNORMAL HIGH (ref 70–99)
Glucose-Capillary: 127 mg/dL — ABNORMAL HIGH (ref 70–99)

## 2023-09-26 LAB — CBC
HCT: 32.9 % — ABNORMAL LOW (ref 39.0–52.0)
Hemoglobin: 11.1 g/dL — ABNORMAL LOW (ref 13.0–17.0)
MCH: 29.2 pg (ref 26.0–34.0)
MCHC: 33.7 g/dL (ref 30.0–36.0)
MCV: 86.6 fL (ref 80.0–100.0)
Platelets: 204 10*3/uL (ref 150–400)
RBC: 3.8 MIL/uL — ABNORMAL LOW (ref 4.22–5.81)
RDW: 12.8 % (ref 11.5–15.5)
WBC: 12.1 10*3/uL — ABNORMAL HIGH (ref 4.0–10.5)
nRBC: 0 % (ref 0.0–0.2)

## 2023-09-26 LAB — BASIC METABOLIC PANEL WITH GFR
Anion gap: 7 (ref 5–15)
BUN: 61 mg/dL — ABNORMAL HIGH (ref 6–20)
CO2: 28 mmol/L (ref 22–32)
Calcium: 8.1 mg/dL — ABNORMAL LOW (ref 8.9–10.3)
Chloride: 98 mmol/L (ref 98–111)
Creatinine, Ser: 1.63 mg/dL — ABNORMAL HIGH (ref 0.61–1.24)
GFR, Estimated: 49 mL/min — ABNORMAL LOW (ref >60)
Glucose, Bld: 142 mg/dL — ABNORMAL HIGH (ref 70–99)
Potassium: 4.6 mmol/L (ref 3.5–5.1)
Sodium: 133 mmol/L — ABNORMAL LOW (ref 135–145)

## 2023-09-26 NOTE — Progress Notes (Signed)
 Speech Language Pathology Treatment:    Patient Details Name: Jonathon Bray MRN: [id number] DOB: March 13, 1966 Today's Date: 09/26/2023 Time: 9069-9059 SLP Time Calculation (min) (ACUTE ONLY): 10 min  Assessment / Plan / Recommendation Clinical Impression  Pt seen for ongoing dysphagia management.  Pt has been noncompliant with recommendations from FEES on 6/20 which revealed silent aspiration.  Pt has been drinking water.  RN reports pt eating snacks brought in that are not consistent with mechanically altered diet.  Pt tolerated puree and regular texture solid.  He exhibited good oral clearance of solids.  Pt tolerated small teaspoon of thin liquid with SLP. Reviewed results of FEES with pt with diet recommendations, and risks of aspiration pneumonia.  Pt is relatively young and hopefully given time over weekend to recover his swallow function is improving.  Given silent aspiration, pt will need instrumental to advance liquid consistency.  Pt declines repeat FEES.  Will plan for MBS later this date.  Pt may advance diet texture to regular given improvement in efficiency of oral phase.  Recommend regular texture diet with nectar thick liquid, pending instrumental reassessment.    HPI HPI: Caron Tardif is a 58 year old man with a history of hypertension, type 2 insulin -dependent diabetes, stage II chronic kidney disease, obesity ascending aortic ectasia, and aortic stenosis s/p open heart surgery for replacement of aortic valve on 09/22/23. Intubated <1 day for surgery. Extubated 6/19.      SLP Plan  MBS          Recommendations  Diet recommendations: Regular;Nectar-thick liquid Liquids provided via: Cup Medication Administration: Whole meds with puree Compensations: Slow rate;Small sips/bites;Multiple dry swallows after each bite/sip Postural Changes and/or Swallow Maneuvers: Seated upright 90 degrees                  Oral care QID     Dysphagia, oropharyngeal phase  (R13.12)     MBS     Anette FORBES Grippe, MA, CCC-SLP Acute Rehabilitation Services Office: [phone] 09/26/2023, 9:48 AM

## 2023-09-26 NOTE — Progress Notes (Signed)
 Modified Barium Swallow Study  Patient Details  Name: Jonathon Bray MRN: [id number] Date of Birth: [birth year]  Today's Date: 09/26/2023  Modified Barium Swallow completed.  Full report located under Chart Review in the Imaging Section.  History of Present Illness Jonathon Bray is a 58 year old man with a history of hypertension, type 2 insulin -dependent diabetes, stage II chronic kidney disease, obesity ascending aortic ectasia, and aortic stenosis s/p open heart surgery for replacement of aortic valve on 09/22/23. Intubated <1 day for surgery. Extubated 6/19. FEES 6/20 showed silent aspiration of ice chips and thin liquids and was recommended a regular diet with nectar thick liquids, although did not adhere to recommendations to consume thickener, prompting expedited re-evaluation.   Clinical Impression Pt continues to present with moderate oropharyngeal dysphagia characterized by mistiming, particularly with larger boluses. While epiglottic inversion and laryngeal vestibule closure are complete, swallow initiation is consistently triggered at the level of the pyriform sinuses, which allows boluses to invade the airway prior to the structures closing. Thin liquids progress quickly past the vocal folds without sensation (PAS 8). His cued coughs are breathy and wholly ineffective at clearing aspirates. A chin tuck posture did not yield functional improvement in performance as it also resulted in silent aspiration. Nectar thick liquids do result in penetration which is eventually cleared with subswalows (PAS 3). The 13 mm barium tablet was administered with nectar thick liquids and oropharyngeally was [facility], although note esophageal retention with backflow up through the proximal esophagus. Recommend he continue current diet of regular solids with nectar thick liquids. Will f/u for RMT and ongoing education. Factors that may increase risk of adverse event in presence of aspiration Noe & Lianne  2021): Limited mobility;Weak cough  Swallow Evaluation Recommendations Recommendations: PO diet PO Diet Recommendation: Regular;Mildly thick liquids (Level 2, nectar thick) Liquid Administration via: Cup;Straw Medication Administration: Whole meds with liquid Supervision: Patient able to self-feed;Full supervision/cueing for swallowing strategies Swallowing strategies  : Minimize environmental distractions;Slow rate;Small bites/sips Postural changes: Position pt fully upright for meals;Stay upright 30-60 min after meals Oral care recommendations: Oral care BID (2x/day)      Damien Blumenthal, M.A., CCC-SLP Speech Language Pathology, Acute Rehabilitation Services  Secure Chat preferred [phone]  09/26/2023,1:47 PM

## 2023-09-26 NOTE — Plan of Care (Signed)

## 2023-09-26 NOTE — Progress Notes (Signed)
 4 Days Post-Op Procedure(s) (LRB): REPLACEMENT, AORTIC VALVE, OPEN UTILIZING INSPIRIS RESILIA AORTIC VALVE (N/A) ECHOCARDIOGRAM, TRANSESOPHAGEAL, INTRAOPERATIVE (N/A) Subjective: No complaints this AM, has walked already  Objective: Vital signs in last 24 hours: Temp:  [97.6 F (36.4 C)-98.1 F (36.7 C)] 97.8 F (36.6 C) (06/23 0700) Pulse Rate:  [79-91] 83 (06/22 1626) Cardiac Rhythm: Normal sinus rhythm (06/22 2000) Resp:  [15-46] 19 (06/23 0032) BP: (100-137)/(61-86) 100/61 (06/23 0030) SpO2:  [92 %-97 %] 95 % (06/23 0030) Weight:  [96.3 kg] 96.3 kg (06/23 0446)  Hemodynamic parameters for last 24 hours:    Intake/Output from previous day: 06/22 0701 - 06/23 0700 In: 240 [P.O.:240] Out: -  Intake/Output this shift: No intake/output data recorded.  General appearance: alert, cooperative, and no distress Neurologic: intact Heart: regular rate and rhythm Lungs: diminished breath sounds bibasilar Abdomen: normal findings: soft, non-tender Extremities: edema 1+  Lab Results: Recent Labs    09/25/23 0238 09/26/23 0620  WBC 17.0* 12.1*  HGB 11.3* 11.1*  HCT 33.4* 32.9*  PLT 197 204   BMET:  Recent Labs    09/25/23 0238 09/26/23 0620  NA 131* 133*  K 5.3* 4.6  CL 98 98  CO2 24 28  GLUCOSE 129* 142*  BUN 58* 61*  CREATININE 1.73* 1.63*  CALCIUM  8.2* 8.1*    PT/INR: No results for input(s): LABPROT, INR in the last 72 hours. ABG    Component Value Date/Time   PHART 7.260 (L) 09/22/2023 1758   HCO3 20.2 09/22/2023 1758   TCO2 22 09/22/2023 1758   ACIDBASEDEF 7.0 (H) 09/22/2023 1758   O2SAT 91 09/22/2023 1758   CBG (last 3)  Recent Labs    09/25/23 0616 09/25/23 2051 09/26/23 0609  GLUCAP 100* 164* 139*    Assessment/Plan: S/P Procedure(s) (LRB): REPLACEMENT, AORTIC VALVE, OPEN UTILIZING INSPIRIS RESILIA AORTIC VALVE (N/A) ECHOCARDIOGRAM, TRANSESOPHAGEAL, INTRAOPERATIVE (N/A) Plan for transfer to step-down: see transfer  orders Awaiting bed on tele NEURO- intact CV- in Sr   ASA, metoprolol , statin RESP- bibasilar atelectasis  Continue IS RENAL- creatinine improved  Continue BID torsemide  ENDO- CBG better controlled GI- tolerating diet  Swallowing better- reassess  SCD + enoxaparin  Cardiac rehab   LOS: 10 days    Elspeth JAYSON Millers 09/26/2023

## 2023-09-26 NOTE — Progress Notes (Signed)
 Speech Language Pathology Treatment: Dysphagia  Patient Details Name: Jonathon Bray MRN: [id number] DOB: 1965-11-02 Today's Date: 09/26/2023 Time: 8483-8464 SLP Time Calculation (min) (ACUTE ONLY): 19 min  Assessment / Plan / Recommendation Clinical Impression  SLP f/u with pt and his spouse to review MBS and provide education. Discussed initiating a Free Water Protocol to provide comfort while he is primarily consuming thickened liquids. Pt and his spouse are agreeable and verbalized understanding of recommendations to complete more frequent oral care, including before PO intake and specifically before consumption of ice chips or thin water. He also completed EMST set to 15 cm/H2O x10 with great effort. Encouraged him to continue this 3x/day. For now, recommend continuing regular diet with nectar thick liquids. He can have ice chips or tspns of water in moderation between meals and after oral care. SLP will continue following with recommendations to continue f/u post-acutely.   The [employer] Protocol (FFWP) allows patients with dysphagia to drink plain water (neutral pH and free of bacteria) between meals, potentially improving hydration, oral mucosa, preservation of swallowing musculature, and quality of life, while maintaining safety through strict oral hygiene and swallowing guidelines. Patients must be assessed by a speech-language pathologist (SLP) to determine if they are appropriate for the protocol. Water is allowed between meals and 30 minutes after meals, but not during meals and only after thorough oral care.    HPI HPI: Jonathon Bray is a 58 year old man with a history of hypertension, type 2 insulin -dependent diabetes, stage II chronic kidney disease, obesity ascending aortic ectasia, and aortic stenosis s/p open heart surgery for replacement of aortic valve on 09/22/23. Intubated <1 day for surgery. Extubated 6/19. FEES 6/20 showed silent aspiration of ice chips and thin liquids  and was recommended a regular diet with nectar thick liquids, although did not adhere to recommendations to consume thickener, prompting expedited re-evaluation.      SLP Plan  Continue with current plan of care          Recommendations  Diet recommendations: Regular;Nectar-thick liquid Liquids provided via: Cup;Straw Medication Administration: Whole meds with liquid Supervision: Patient able to self feed;Intermittent supervision to cue for compensatory strategies Compensations: Slow rate;Small sips/bites;Multiple dry swallows after each bite/sip Postural Changes and/or Swallow Maneuvers: Seated upright 90 degrees;Upright 30-60 min after meal                  Oral care QID;Oral care before and after PO   Frequent or constant Supervision/Assistance Dysphagia, oropharyngeal phase (R13.12)     Continue with current plan of care     Damien Blumenthal, M.A., CCC-SLP Speech Language Pathology, Acute Rehabilitation Services  Secure Chat preferred [phone]   09/26/2023, 3:45 PM

## 2023-09-26 NOTE — Progress Notes (Signed)
 Pt arrived to rm 9 from 2H. Initiated tele. Obtained VS. Oriented pt to the unit. Call bell within reach.   Amado GORMAN Arabia, RN

## 2023-09-27 LAB — BASIC METABOLIC PANEL WITH GFR
Anion gap: 10 (ref 5–15)
BUN: 57 mg/dL — ABNORMAL HIGH (ref 6–20)
CO2: 30 mmol/L (ref 22–32)
Calcium: 8 mg/dL — ABNORMAL LOW (ref 8.9–10.3)
Chloride: 98 mmol/L (ref 98–111)
Creatinine, Ser: 1.39 mg/dL — ABNORMAL HIGH (ref 0.61–1.24)
GFR, Estimated: 59 mL/min — ABNORMAL LOW (ref >60)
Glucose, Bld: 50 mg/dL — ABNORMAL LOW (ref 70–99)
Potassium: 3.5 mmol/L (ref 3.5–5.1)
Sodium: 138 mmol/L (ref 135–145)

## 2023-09-27 LAB — CBC
HCT: 30.4 % — ABNORMAL LOW (ref 39.0–52.0)
Hemoglobin: 10.4 g/dL — ABNORMAL LOW (ref 13.0–17.0)
MCH: 29.1 pg (ref 26.0–34.0)
MCHC: 34.2 g/dL (ref 30.0–36.0)
MCV: 85.2 fL (ref 80.0–100.0)
Platelets: 250 10*3/uL (ref 150–400)
RBC: 3.57 MIL/uL — ABNORMAL LOW (ref 4.22–5.81)
RDW: 12.7 % (ref 11.5–15.5)
WBC: 12.5 10*3/uL — ABNORMAL HIGH (ref 4.0–10.5)
nRBC: 0 % (ref 0.0–0.2)

## 2023-09-27 LAB — GLUCOSE, CAPILLARY
Glucose-Capillary: 93 mg/dL (ref 70–99)
Glucose-Capillary: 316 mg/dL — ABNORMAL HIGH (ref 70–99)
Glucose-Capillary: 88 mg/dL (ref 70–99)
Glucose-Capillary: 41 mg/dL — CL (ref 70–99)
Glucose-Capillary: 100 mg/dL — ABNORMAL HIGH (ref 70–99)

## 2023-09-27 MED ORDER — TORSEMIDE 20 MG PO TABS
40.0000 mg | ORAL_TABLET | Freq: Once | ORAL | Status: AC
  Administered 2023-09-27: 40 mg via ORAL
  Filled 2023-09-27: qty 2

## 2023-09-27 MED ORDER — TORSEMIDE 20 MG PO TABS
20.0000 mg | ORAL_TABLET | Freq: Two times a day (BID) | ORAL | Status: DC
  Administered 2023-09-27 – 2023-09-28 (×2): 20 mg via ORAL
  Filled 2023-09-27 (×2): qty 1

## 2023-09-27 MED ORDER — POTASSIUM CHLORIDE CRYS ER 20 MEQ PO TBCR
40.0000 meq | EXTENDED_RELEASE_TABLET | Freq: Once | ORAL | Status: AC
  Administered 2023-09-27: 40 meq via ORAL
  Filled 2023-09-27: qty 2

## 2023-09-27 NOTE — Evaluation (Signed)
 Physical Therapy Evaluation Patient Details Name: Jonathon Bray MRN: [id number] DOB: [birth year] Today's Date: 09/27/2023  History of Present Illness  Pt is a 58 y/o male with severe AS and HF presenting 6/13 for evaluation of hypotension, presyncope, DOE and orthopnea.  Pt s/p AVR 6/19 via median sternotomy.   PMHx:  DM, HTN  AS, kyphoplasty.  Clinical Impression  Pt admitted with/for AVR.  Pt presently needing CGA to min assist overall.  Pt currently limited functionally due to the problems listed below.  (see problems list.)  Pt will benefit from PT to maximize function and safety to be able to get home safely with available assist.  Pt can benefit from HHPT to prep for potential CRP2 or just go staight to CRP2 if ready.         If plan is discharge home, recommend the following: A little help with walking and/or transfers;Assistance with cooking/housework;Assist for transportation   Can travel by private vehicle        Equipment Recommendations None recommended by PT  Recommendations for Other Services       Functional Status Assessment Patient has had a recent decline in their functional status and demonstrates the ability to make significant improvements in function in a reasonable and predictable amount of time.     Precautions / Restrictions Precautions Precautions: Fall Recall of Precautions/Restrictions: Impaired (wife available to keep him true to the sternal precautions)      Mobility  Bed Mobility               General bed mobility comments: OOB on arrival    Transfers Overall transfer level: Needs assistance Equipment used: Rolling walker (2 wheels) Transfers: Sit to/from Stand Sit to Stand: Contact guard assist           General transfer comment: cues for sternal precautions as related to sit to stand    Ambulation/Gait Ambulation/Gait assistance: Supervision Gait Distance (Feet): 280 Feet Assistive device: Rolling walker (2 wheels) Gait  Pattern/deviations: Step-through pattern   Gait velocity interpretation: 1.31 - 2.62 ft/sec, indicative of limited community ambulator   General Gait Details: generally steady when cued for safe proximity to the RW and for better posture.  Otherwise satisfactory gait speed and use of the RW  Stairs            Wheelchair Mobility     Tilt Bed    Modified Rankin (Stroke Patients Only)       Balance                                             Pertinent Vitals/Pain Pain Assessment Pain Assessment: Faces Faces Pain Scale: No hurt Pain Intervention(s): Monitored during session    Home Living Family/patient expects to be discharged to:: Private residence Living Arrangements: Spouse/significant other Available Help at Discharge: Family;Available 24 hours/day Type of Home: House Home Access: Ramped entrance       Home Layout: One level Home Equipment: Pharmacist, hospital (2 wheels);Cane - single point      Prior Function Prior Level of Function : Independent/Modified Independent                     Extremity/Trunk Assessment   Upper Extremity Assessment Upper Extremity Assessment: Overall WFL for tasks assessed    Lower Extremity Assessment Lower Extremity Assessment: Overall WFL for tasks  assessed (generally weak proximally and swollen)    Cervical / Trunk Assessment Cervical / Trunk Assessment:  (slouched posture)  Communication   Communication Communication: No apparent difficulties    Cognition Arousal: Alert Behavior During Therapy: WFL for tasks assessed/performed   PT - Cognitive impairments: No apparent impairments                         Following commands: Intact       Cueing Cueing Techniques: Verbal cues     General Comments General comments (skin integrity, edema, etc.): educated on sternal precautions, using the Visual of the tube  Answered questions from pt /wife.  VSS, no overt  dyspnea.    Exercises     Assessment/Plan    PT Assessment Patient needs continued PT services  PT Problem List Decreased strength;Decreased activity tolerance;Decreased balance;Decreased knowledge of use of DME;Cardiopulmonary status limiting activity       PT Treatment Interventions DME instruction;Gait training;Stair training;Functional mobility training;Therapeutic activities;Patient/family education    PT Goals (Current goals can be found in the Care Plan section)  Acute Rehab PT Goals Patient Stated Goal: Independent PT Goal Formulation: With patient Time For Goal Achievement: 10/10/23 Potential to Achieve Goals: Good    Frequency Min 3X/week     Co-evaluation               AM-PAC PT 6 Clicks Mobility  Outcome Measure Help needed turning from your back to your side while in a flat bed without using bedrails?: A Little Help needed moving from lying on your back to sitting on the side of a flat bed without using bedrails?: A Little Help needed moving to and from a bed to a chair (including a wheelchair)?: A Little Help needed standing up from a chair using your arms (e.g., wheelchair or bedside chair)?: A Little Help needed to walk in hospital room?: A Little Help needed climbing 3-5 steps with a railing? : A Lot 6 Click Score: 17    End of Session   Activity Tolerance: Patient tolerated treatment well Patient left: in chair;with call bell/phone within reach;with family/visitor present Nurse Communication: Mobility status;Precautions PT Visit Diagnosis: Other abnormalities of gait and mobility (R26.89)    Time: 8779-8746 PT Time Calculation (min) (ACUTE ONLY): 33 min   Charges:   PT Evaluation $PT Eval Moderate Complexity: 1 Mod PT Treatments $Gait Training: 8-22 mins PT General Charges $$ ACUTE PT VISIT: 1 Visit         09/27/2023  Jonathon Bray., PT Acute Rehabilitation Services [phone]  (office)  Jonathon Bray Jonathon Bray 09/27/2023, 1:03 PM

## 2023-09-27 NOTE — Progress Notes (Signed)
 CARDIAC REHAB PHASE I    Pt ambulated with mobility th\is am. Reports tolerating well, with no pain, SOB or dizziness.  Post [facility] education including site care, restrictions, heart healthy diabetic diet, sternal precautions, IS use at home, home needs at discharge, exercise guidelines and CRP2 reviewed. All questions and concerns addressed. Will refer to [facility] for CRP2. Will continue to follow.    Vaughn Asberry Hacking, RN BSN 09/27/2023 11:00 AM    [phone]

## 2023-09-27 NOTE — Progress Notes (Addendum)
 Mobility Specialist Progress Note:    09/27/23 1112  Mobility  Activity Ambulated with assistance in hallway  Level of Assistance Contact guard assist, steadying assist  Assistive Device Front wheel walker  Distance Ambulated (ft) 470 ft  RUE Weight Bearing Per Provider Order WBAT  LUE Weight Bearing Per Provider Order WBAT  Activity Response Tolerated well  Mobility Referral Yes  Mobility visit 1 Mobility  Mobility Specialist Start Time (ACUTE ONLY) 1005  Mobility Specialist Stop Time (ACUTE ONLY) 1017  Mobility Specialist Time Calculation (min) (ACUTE ONLY) 12 min   Received pt ambulating to the door w/ family and agreeable to ambulate in hallway w/ MS. C/o BLE pain, otherwise no complaints. Returned to room w/o fault. Pt left in chair with alarm on. Personal belongings and call light within reach. All needs met.   Lavanda Pollack Mobility Specialist  Please contact via [employer] or  [phone]

## 2023-09-27 NOTE — Progress Notes (Addendum)
 [address]       [place] [address]             [phone]      5 Days Post-Op Procedure(s) (LRB): REPLACEMENT, AORTIC VALVE, OPEN UTILIZING INSPIRIS RESILIA AORTIC VALVE (N/A) ECHOCARDIOGRAM, TRANSESOPHAGEAL, INTRAOPERATIVE (N/A) Subjective: Patient states he wants to go home but admits he hasn't walked in the hall since being upstairs yet. He does admit to ambulating while in the unit.   Objective: Vital signs in last 24 hours: Temp:  [97.5 F (36.4 C)-97.7 F (36.5 C)] 97.7 F (36.5 C) (06/24 0341) Pulse Rate:  [69-77] 69 (06/24 0341) Cardiac Rhythm: Normal sinus rhythm (06/23 2044) Resp:  [15-28] 23 (06/24 0613) BP: (103-128)/(68-87) 105/68 (06/24 0341) SpO2:  [92 %-97 %] 93 % (06/24 0341) Weight:  [95.7 kg] 95.7 kg (06/24 0613)  Hemodynamic parameters for last 24 hours:    Intake/Output from previous day: No intake/output data recorded. Intake/Output this shift: No intake/output data recorded.  General appearance: alert, cooperative, and no distress Neurologic: intact Heart: regular rate and rhythm, S1, S2 normal, no murmur, click, rub or gallop Lungs: slightly diminished bibasilar breath sounds Abdomen: soft, non-tender; bowel sounds normal; no masses,  no organomegaly Extremities: edema 2+ BLE Wound: Clean and dry without sign of infection  Lab Results: Recent Labs    09/26/23 0620 09/27/23 0313  WBC 12.1* 12.5*  HGB 11.1* 10.4*  HCT 32.9* 30.4*  PLT 204 250   BMET:  Recent Labs    09/26/23 0620 09/27/23 0313  NA 133* 138  K 4.6 3.5  CL 98 98  CO2 28 30  GLUCOSE 142* 50*  BUN 61* 57*  CREATININE 1.63* 1.39*  CALCIUM  8.1* 8.0*    PT/INR: No results for input(s): LABPROT, INR in the last 72 hours. ABG    Component Value Date/Time   PHART 7.260 (L) 09/22/2023 1758   HCO3 20.2 09/22/2023 1758   TCO2 22 09/22/2023 1758   ACIDBASEDEF 7.0 (H) 09/22/2023 1758   O2SAT 91 09/22/2023 1758   CBG (last 3)   Recent Labs    09/26/23 1737 09/26/23 2123 09/27/23 0612  GLUCAP 116* 127* 93    Assessment/Plan: S/P Procedure(s) (LRB): REPLACEMENT, AORTIC VALVE, OPEN UTILIZING INSPIRIS RESILIA AORTIC VALVE (N/A) ECHOCARDIOGRAM, TRANSESOPHAGEAL, INTRAOPERATIVE (N/A)  CV: BP controlled, SBP 105-120. NSR, HR 70s. On Toprol  XL 25mg  daily. ST elevations on tele, will get EKG this AM. No murmur or chest pain this AM.   Pulm: Saturating well on RA. Last CXR with small right pleural effusion. Continue Torsemide . Encourage IS and ambulation.   GI: SLP following for silent aspiration of thin liquids, diet recommendation is a regular diet with nectar thick liquids but patient reports he is drinking broths and has not adhered to SLP recommendations. +BMx2 yesterday. Tolerating a diet.   Endo: T2DM, preop A1C 8.3. CBGs controlled on Semglee  20U BID, Novolog  15U TID  and SSI. Will need close endocrinology follow up as an outpatient.   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 Looks like baseline creatinine is 1.2-1.3. No UO recorded. +10lbs from preop weight. Continue home Torsemide  20mg  BID. May benefit from a larger dose of Torsemide  this AM. Patient removed TED hose, continue TED hose as able. K 3.5, supplement.   ID: Likely reactive leukocytosis trending down, WBC 12.5. Afebrile. No sign of infection. Clinically monitor.   Expected postop ABLA: H/H 10.4/30.4, stable. Not clinically significant.  DVT Prophylaxis: Lovenox   Deconditioning: Has not ambulated in the hall since being upstairs. Patient needs to ambulate  Dispo: Patient is eager to go home today. Will check EKG for ST elevation on tele. Continue TED hose and Torsemide  for +10lbs. Possibly d/c later today vs tomorrow, will discuss with Dr. Kerrin.   LOS: 11 days    Con GORMAN [place], [place] 09/27/2023

## 2023-09-28 ENCOUNTER — Other Ambulatory Visit (HOSPITAL_COMMUNITY): Payer: Self-pay

## 2023-09-28 ENCOUNTER — Encounter (HOSPITAL_COMMUNITY): Payer: Self-pay | Admitting: Speech Pathology

## 2023-09-28 LAB — BASIC METABOLIC PANEL WITH GFR
Anion gap: 11 (ref 5–15)
BUN: 54 mg/dL — ABNORMAL HIGH (ref 6–20)
CO2: 32 mmol/L (ref 22–32)
Calcium: 8.4 mg/dL — ABNORMAL LOW (ref 8.9–10.3)
Chloride: 96 mmol/L — ABNORMAL LOW (ref 98–111)
Creatinine, Ser: 1.51 mg/dL — ABNORMAL HIGH (ref 0.61–1.24)
GFR, Estimated: 54 mL/min — ABNORMAL LOW (ref >60)
Glucose, Bld: 90 mg/dL (ref 70–99)
Potassium: 4.1 mmol/L (ref 3.5–5.1)
Sodium: 139 mmol/L (ref 135–145)

## 2023-09-28 LAB — CBC
HCT: 33.3 % — ABNORMAL LOW (ref 39.0–52.0)
Hemoglobin: 11 g/dL — ABNORMAL LOW (ref 13.0–17.0)
MCH: 28.6 pg (ref 26.0–34.0)
MCHC: 33 g/dL (ref 30.0–36.0)
MCV: 86.7 fL (ref 80.0–100.0)
Platelets: 299 10*3/uL (ref 150–400)
RBC: 3.84 MIL/uL — ABNORMAL LOW (ref 4.22–5.81)
RDW: 13 % (ref 11.5–15.5)
WBC: 11.3 10*3/uL — ABNORMAL HIGH (ref 4.0–10.5)
nRBC: 0 % (ref 0.0–0.2)

## 2023-09-28 LAB — GLUCOSE, CAPILLARY
Glucose-Capillary: 100 mg/dL — ABNORMAL HIGH (ref 70–99)
Glucose-Capillary: 172 mg/dL — ABNORMAL HIGH (ref 70–99)
Glucose-Capillary: 60 mg/dL — ABNORMAL LOW (ref 70–99)
Glucose-Capillary: 62 mg/dL — ABNORMAL LOW (ref 70–99)
Glucose-Capillary: 90 mg/dL (ref 70–99)
Glucose-Capillary: 174 mg/dL — ABNORMAL HIGH (ref 70–99)

## 2023-09-28 MED ORDER — FUROSEMIDE 10 MG/ML IJ SOLN
40.0000 mg | Freq: Once | INTRAMUSCULAR | Status: AC
  Administered 2023-09-28: 40 mg via INTRAVENOUS
  Filled 2023-09-28: qty 4

## 2023-09-28 MED ORDER — INSULIN ASPART 100 UNIT/ML IJ SOLN: 10.0000 [IU] | Freq: Three times a day (TID) | INTRAMUSCULAR | Status: DC

## 2023-09-28 MED ORDER — OXYCODONE HCL 5 MG PO TABS
5.0000 mg | ORAL_TABLET | Freq: Four times a day (QID) | ORAL | 0 refills | Status: DC | PRN
  Filled 2023-09-28: qty 28, 7d supply, fill #0

## 2023-09-28 MED ORDER — POTASSIUM CHLORIDE CRYS ER 20 MEQ PO TBCR
40.0000 meq | EXTENDED_RELEASE_TABLET | Freq: Once | ORAL | Status: AC
  Administered 2023-09-28: 40 meq via ORAL
  Filled 2023-09-28: qty 2

## 2023-09-28 MED ORDER — ASPIRIN 325 MG PO TBEC
325.0000 mg | DELAYED_RELEASE_TABLET | Freq: Every day | ORAL | 3 refills | Status: DC
  Filled 2023-09-28: qty 30, 30d supply, fill #0

## 2023-09-28 MED ORDER — GABAPENTIN 100 MG PO CAPS
200.0000 mg | ORAL_CAPSULE | Freq: Two times a day (BID) | ORAL | 1 refills | Status: DC
Start: 2023-09-28 — End: 2023-10-14
  Filled 2023-09-28: qty 60, 15d supply, fill #0

## 2023-09-28 MED ORDER — METOPROLOL SUCCINATE ER 25 MG PO TB24
25.0000 mg | ORAL_TABLET | Freq: Every day | ORAL | 1 refills | Status: DC
  Filled 2023-09-28: qty 30, 30d supply, fill #0

## 2023-09-28 MED ORDER — ACETAMINOPHEN 325 MG PO TABS: 650.0000 mg | ORAL_TABLET | Freq: Four times a day (QID) | ORAL | Status: AC | PRN

## 2023-09-28 NOTE — Progress Notes (Signed)
 Discharge instructions (including medications) discussed with and copy provided to patient/caregiver both verbalized understanding. PIV removed and patient assisted with dressing. All belongings sent with patient and he was discharged home with family.

## 2023-09-28 NOTE — Progress Notes (Signed)
 Orthopedic Tech Progress Note Patient Details:  Jonathon Bray 04-Jul-1965 [id number]  Ortho Devices Type of Ortho Device: Nonie boot Ortho Device/Splint Location: BLE Ortho Device/Splint Interventions: Application   Post Interventions Patient Tolerated: Well Instructions Provided: Adjustment of device  [name] 09/28/2023, 10:43 AM

## 2023-09-28 NOTE — Progress Notes (Signed)
 Hypoglycemic Event  CBG: 60  Treatment: 4 oz juice/soda  Symptoms: None  Follow-up CBG: Time:1222 CBG Result:90  Possible Reasons for Event: unknown   Comments/MD notified:yes    Jonathon Bray

## 2023-09-28 NOTE — TOC Transition Note (Signed)
 Transition of Care (TOC) - Discharge Note Jonathon Gobble RN, BSN Transitions of Care Unit 4E- RN Case Manager See Treatment Team for direct phone #   Patient Details  Name: Jonathon Bray MRN: [id number] Date of Birth: 11/23/1965  Transition of Care [facility]) CM/SW Contact:  Bray Jonathon Hurst, RN Phone Number: 09/28/2023, 2:07 PM   Clinical Narrative:    Pt stable for transition home today, Orders placed for HHPT/OT needs. Per progression rounds- plan for pt to have Unna placed today prior to discharge- team would like to see if [facility] can also provide RN for Unna boots 1-2 times/week. HH orders updated.  CM in to speak with pt at bedside, wife also present. Discussed HH needs- pt agreeable- list provided for [facility] choice Per CMS guidelines from [employer] website with star ratings (copy placed in shadow chart)- pt voiced he does not have a preference- after review of list wife voiced she would like to see if [facility] can service- otherwise no further preference. Pt voiced he has needed DME at home- cane and walker- no new DME needs at this time.   Address, phone # and PCP all confirmed. Wife to transport home.   Call made to [facility] liaison- liaison to check with [facility] office to see if they can service.  Call also made to [facility] liaison to check and see if they can service- after checking with their [facility] office- they can only service for therapy- no RN availability.   1215- received msg from SLP regarding pt needing outpt SLP f/u for outpt MBS- reached out to TCTS PA- Jonathon C. Who voiced she had placed orders- CM will f/u with SLP regarding where outpt MBS referrals are done.   1400- Still awaiting HH confirmation on nursing needs- spoke with pt and wife who are agreeable for CM to f/u with them via TC once HH confirmed. Explained to pt and wife that nursing may not be available and pt may only have therapy. Per [place]- TCTS PA- pt will follow up with them next week  for Unna boot change in office. If unable to secure [facility] nursing they will decide further plans at that time.   CM to f/u with pt and wife once [facility] and outpt SLP needs confirmed.     Final next level of care: Home w Home Health Services Barriers to Discharge: Barriers Resolved   Patient Goals and CMS Choice Patient states their goals for this hospitalization and ongoing recovery are:: return home CMS Medicare.gov Compare Post Acute Care list provided to:: Patient Choice offered to / list presented to : Patient      Discharge Placement               Home w/ [facility]        Discharge Plan and Services Additional resources added to the After Visit Summary for     Discharge Planning Services: CM Consult Post Acute Care Choice: Home Health          DME Arranged: N/A DME Agency: NA       HH Arranged: RN, PT, OT   Date HH Agency Contacted: 09/28/23      Social Drivers of Health (SDOH) Interventions SDOH Screenings   Food Insecurity: No Food Insecurity (09/16/2023)  Housing: Low Risk  (09/16/2023)  Transportation Needs: No Transportation Needs (09/16/2023)  Utilities: Not At Risk (09/16/2023)  Social Connections: Unknown (08/17/2021)   Received from [facility]  Tobacco Use: Low Risk  (09/22/2023)  Readmission Risk Interventions    09/28/2023    2:07 PM  Readmission Risk Prevention Plan  Transportation Screening Complete  PCP or Specialist Appt within 5-7 Days Complete  Home Care Screening Complete  Medication Review (RN CM) Complete

## 2023-09-28 NOTE — Evaluation (Signed)
 Occupational Therapy Evaluation Patient Details Name: Jonathon Bray MRN: [id number] DOB: Aug 23, 1965 Today's Date: 09/28/2023   History of Present Illness   Pt is a 58 y/o male with severe AS and HF presenting 6/13 for evaluation of hypotension, presyncope, DOE and orthopnea.  Pt s/p AVR 6/19 via median sternotomy.   PMHx:  DM, HTN  AS, kyphoplasty.     Clinical Impressions Pt was independent prior to admission, driving and working. He lives with his supportive wife who is also the caregiver of her mother. Pt presents with generalized weakness, impaired standing balance, edematous and painful LEs and difficulty generalizing sternal precautions. He needs CGA for standing with reminders for hand placement and supervision with RW for ambulation. He requires min to total assist for ADLs. Educated pt and wife in sternal precautions related to ADLs and provided [employer] written handout to reinforce. Pt to discharge home later today, recommending HHOT to continue ADL training and reinforce compensatory strategies for ADLs adhering to sternal and fall precautions.      If plan is discharge home, recommend the following:   A little help with walking and/or transfers;A lot of help with bathing/dressing/bathroom;Assistance with cooking/housework;Assist for transportation;Help with stairs or ramp for entrance     Functional Status Assessment         Equipment Recommendations   Tub/shower seat (will get on his own when allowed to shower)     Recommendations for Other Services         Precautions/Restrictions   Precautions Precautions: Fall Recall of Precautions/Restrictions: Impaired Restrictions Weight Bearing Restrictions Per Provider Order: No     Mobility Bed Mobility               General bed mobility comments: OOB on arrival    Transfers Overall transfer level: Needs assistance Equipment used: Rolling walker (2 wheels) Transfers: Sit to/from  Stand Sit to Stand: Contact guard assist           General transfer comment: cues for hands on knees, use of momentum, mild unsteadiness upon initial stand      Balance Overall balance assessment: Needs assistance   Sitting balance-Leahy Scale: Fair     Standing balance support: Bilateral upper extremity supported Standing balance-Leahy Scale: Poor                             ADL either performed or assessed with clinical judgement   ADL Overall ADL's : Needs assistance/impaired Eating/Feeding: Independent;Sitting   Grooming: Supervision/safety;Standing   Upper Body Bathing: Minimal assistance;Sitting Upper Body Bathing Details (indicate cue type and reason): recommended long handled bath sponge Lower Body Bathing: Sit to/from stand;Maximal assistance Lower Body Bathing Details (indicate cue type and reason): pt going home with unna boots Upper Body Dressing : Minimal assistance;Sitting   Lower Body Dressing: Total assistance;Sitting/lateral leans Lower Body Dressing Details (indicate cue type and reason): ted hose and shoes, recommended reacher Toilet Transfer: Supervision/safety;Ambulation   Toileting- Clothing Manipulation and Hygiene: Minimal assistance;Sit to/from stand Toileting - Clothing Manipulation Details (indicate cue type and reason): educated in technique for posterior pericare, recommended wet wipes and tongs     Functional mobility during ADLs: Supervision/safety;Rolling walker (2 wheels)       Vision         Perception         Praxis         Pertinent Vitals/Pain Pain Assessment Pain Assessment: Faces Faces Pain  Scale: Hurts little more Pain Location: LEs while donning compression socks Pain Descriptors / Indicators: Discomfort, Grimacing, Guarding Pain Intervention(s): Monitored during session, Repositioned     Extremity/Trunk Assessment Upper Extremity Assessment Upper Extremity Assessment: Overall WFL for tasks  assessed   Lower Extremity Assessment Lower Extremity Assessment: Defer to PT evaluation       Communication Communication Communication: No apparent difficulties   Cognition Arousal: Alert Behavior During Therapy: WFL for tasks assessed/performed Cognition: Cognition impaired       Memory impairment (select all impairments): Short-term memory     OT - Cognition Comments: difficulty generalizing sternal precautions                 Following commands: Intact       Cueing  General Comments   Cueing Techniques: Verbal cues      Exercises     Shoulder Instructions      Home Living   Living Arrangements: Spouse/significant other Available Help at Discharge: Family;Available 24 hours/day Type of Home: House Home Access: Ramped entrance     Home Layout: One level     Bathroom Shower/Tub: Producer, television/film/video: Standard     Home Equipment: Agricultural consultant (2 wheels);Cane - single point          Prior Functioning/Environment Prior Level of Function : Independent/Modified Independent;Driving;Working/employed                    OT Problem List:     OT Treatment/Interventions:        OT Goals(Current goals can be found in the care plan section)       OT Frequency:       Co-evaluation              AM-PAC OT 6 Clicks Daily Activity     Outcome Measure Help from another person eating meals?: None Help from another person taking care of personal grooming?: A Little Help from another person toileting, which includes using toliet, bedpan, or urinal?: A Lot Help from another person bathing (including washing, rinsing, drying)?: A Lot Help from another person to put on and taking off regular upper body clothing?: A Little Help from another person to put on and taking off regular lower body clothing?: Total 6 Click Score: 15   End of Session Equipment Utilized During Treatment: Gait belt;Rolling walker (2  wheels)  Activity Tolerance: Patient tolerated treatment well Patient left: in chair;with call bell/phone within reach;with family/visitor present  OT Visit Diagnosis: Unsteadiness on feet (R26.81);Pain;Muscle weakness (generalized) (M62.81);Other symptoms and signs involving cognitive function                Time: 9087-9054 OT Time Calculation (min): 33 min Charges:  OT General Charges $OT Visit: 1 Visit OT Evaluation $OT Eval Moderate Complexity: 1 Mod OT Treatments $Self Care/Home Management : 8-22 mins  Mliss HERO, OTR/L Acute Rehabilitation Services Office: [phone]  Kennth Mliss Helling 09/28/2023, 10:28 AM

## 2023-09-28 NOTE — Progress Notes (Signed)
   09/28/23 1400  SLP Visit Information  SLP Received On 09/28/23  General Information  Behavior/Cognition Alert;Cooperative;Pleasant mood  HPI Jonathon Bray is a 58 year old man with a history of hypertension, type 2 insulin -dependent diabetes, stage II chronic kidney disease, obesity ascending aortic ectasia, and aortic stenosis s/p open heart surgery for replacement of aortic valve on 09/22/23. Intubated <1 day for surgery. Extubated 6/19. FEES 6/20 showed silent aspiration of ice chips and thin liquids and was recommended a regular diet with nectar thick liquids, although did not adhere to recommendations to consume thickener, prompting expedited re-evaluation.  Dysphagia Treatment  Respiratory Status Room air  Oral Cavity - Dentition Adequate natural dentition  Treatment Methods Skilled observation;Patient/caregiver education  Family/Caregiver Educated wife  Patient observed directly with PO's Yes  Type of PO's observed Thin liquids;Nectar-thick liquids;Regular  Liquids provided via Cup;Straw  Pharyngeal Phase Signs & Symptoms Delayed cough  Type of cueing Verbal    Treatment:    Note populated after discharge. Pt was seen today for swallowing therapy.  He had poor understanding of the temporary need for thickener, despite education being completed on prior occasions.  Denies aspiration or swallowing difficulties.  Discussed recommendations for f/u OP MBS in one week (either here or at [facility]) . Handouts explaining water protocol provided to pt's wife.  She was given 20 packs of nectar thickener and written instructions re: where to procure thickener.  Pt with recent hx of silent aspiration of thin liquids; today he coughed when drinking sequential sips of thin orange juice that was on his tray upon my arrival.  When discussing concern for at least a transient dysphagia, he was adamant that he was swallowing fine.  Reiterated concerns and recommendations; pt/wife verbalize understanding.     Pain Assessment  Faces Pain Scale 2  Pain Location sternal/BLE  Pain Descriptors / Indicators Discomfort;Grimacing;Guarding  SLP - End of Session  Patient left in chair;with call bell/phone within reach;with family/visitor present  Nurse Communication Diet recommendation  Assessment / Recommendations / Plan  Plan Discharge SLP treatment due to (comment) (Pt is D/Cing home today)  Dysphagia Recommendations  Liquids provided via Cup;Straw  Medication Administration Whole meds with liquid  Supervision Patient able to self feed;Intermittent supervision to cue for compensatory strategies  Compensations Small sips/bites  Postural Changes and/or Swallow Maneuvers Seated upright 90 degrees;Upright 30-60 min after meal  General Recommendations  Oral Care Recommendations Oral care BID  Follow Up Recommendations  (OP MBS x one week)  SLP Time Calculation  SLP Start Time (ACUTE ONLY) 1200  SLP Stop Time (ACUTE ONLY) 1218  SLP Time Calculation (min) (ACUTE ONLY) 18 min  SLP Evaluations  $ SLP Speech Visit 1 Visit  SLP Evaluations  $Swallowing Treatment 1 Procedure

## 2023-09-28 NOTE — Progress Notes (Signed)
 Physical Therapy Treatment Patient Details Name: Jonathon Jonathon MRN: [id number] DOB: 1966/01/23 Today's Date: 09/28/2023   History of Present Illness Pt is a 58 y/o male with severe AS and HF presenting 6/13 for evaluation of hypotension, presyncope, DOE and orthopnea.  Pt s/p AVR 6/19 via median sternotomy.   PMHx:  DM, HTN  AS, kyphoplasty.    PT Comments  Pt received in chair, agreeable to therapy session and with good participation and tolerance for transfer training, standing exercises and balance tasks using RW support with emphasis on sternal precautions with all activities. Pt BP, HR and SpO2 stable throughout on RA, pt with mild to moderate loss of balance with dynamic standing tasks, mostly WFL using RW but needing external assist to maintain balance in addition to RW with significant challenges including foot taps on step and simulated curb step-up with RW. Spouse present and receptive to instruction on Move in the Tube/sternal precs and pt needing to use toilet at end of session, so PTA reviewed precs with bed  mobility via visual/verbal demo with pt's spouse at end of session to reinforce. Pt continues to benefit from PT services to progress toward functional mobility goals.    If plan is discharge home, recommend the following: A little help with walking and/or transfers;Assistance with cooking/housework;Assist for transportation   Can travel by private vehicle        Equipment Recommendations  None recommended by PT    Recommendations for Other Services       Precautions / Restrictions Precautions Precautions: Fall;Sternal Precaution Booklet Issued: Yes (comment) Recall of Precautions/Restrictions: Impaired Precaution/Restrictions Comments: OT brought booklet prior to PT session Restrictions Weight Bearing Restrictions Per Provider Order: No Other Position/Activity Restrictions: sternal precs     Mobility  Bed Mobility               General bed mobility  comments: received in chair    Transfers Overall transfer level: Needs assistance Equipment used: Rolling walker (2 wheels) Transfers: Sit to/from Stand Sit to Stand: Contact guard assist, Min assist           General transfer comment: cues for sternal precautions as related to sit to stand and improved body mechanics    Ambulation/Gait Ambulation/Gait assistance: Supervision, Min assist Gait Distance (Feet): 20 Feet Assistive device: Rolling walker (2 wheels) Gait Pattern/deviations: Step-through pattern       General Gait Details: mostly supervision, x1 epsiode lateral LOB requiring minA to correct and RW support   Stairs Stairs: Yes Stairs assistance: Mod assist, Min assist Stair Management: Step to pattern, Forwards, Backwards, With walker Number of Stairs: 1 General stair comments: single 7 step in room x2 reps for BLE strengthening, pt with LOB stepping up with LLE so reinforced if he needs to ascend a curb wtih RW, use RLE to ascend, spouse present and receptive.   Wheelchair Mobility     Tilt Bed    Modified Rankin (Stroke Patients Only)       Balance Overall balance assessment: Mild deficits observed, not formally tested   Sitting balance-Leahy Scale: Fair     Standing balance support: Bilateral upper extremity supported Standing balance-Leahy Scale: Poor                              Communication Communication Communication: No apparent difficulties  Cognition Arousal: Alert Behavior During Therapy: WFL for tasks assessed/performed   PT - Cognitive impairments: No apparent impairments  PT - Cognition Comments: needs reminders for safe UE placement wtih stand>sit Following commands: Intact      Cueing Cueing Techniques: Verbal cues, Gestural cues  Exercises Other Exercises Other Exercises: standing hip flexion and heel raises x10 reps ea Other Exercises: standing BLE AROM: foot taps up on  platform x10 reps ea LE Other Exercises: STS x 5 reps with hands on his knees reciprocal    General Comments        Pertinent Vitals/Pain Pain Assessment Pain Assessment: Faces Faces Pain Scale: Hurts a little bit Pain Location: sternal/BLE Pain Descriptors / Indicators: Discomfort, Grimacing, Guarding Pain Intervention(s): Limited activity within patient's tolerance, Monitored during session, Repositioned    Home Living   Living Arrangements: Spouse/significant other Available Help at Discharge: Family;Available 24 hours/day Type of Home: House Home Access: Ramped entrance       Home Layout: One level Home Equipment: Agricultural consultant (2 wheels);Cane - single point      Prior Function            PT Goals (current goals can now be found in the care plan section) Acute Rehab PT Goals Patient Stated Goal: Independent PT Goal Formulation: With patient Time For Goal Achievement: 10/10/23 Progress towards PT goals: Progressing toward goals    Frequency    Min 3X/week      PT Plan      Co-evaluation              AM-PAC PT 6 Clicks Mobility   Outcome Measure  Help needed turning from your back to your side while in a flat bed without using bedrails?: A Little Help needed moving from lying on your back to sitting on the side of a flat bed without using bedrails?: A Little Help needed moving to and from a bed to a chair (including a wheelchair)?: A Little Help needed standing up from a chair using your arms (e.g., wheelchair or bedside chair)?: A Little Help needed to walk in hospital room?: A Little Help needed climbing 3-5 steps with a railing? : A Lot 6 Click Score: 17    End of Session Equipment Utilized During Treatment: Gait belt Activity Tolerance: Patient tolerated treatment well Patient left: with call bell/phone within reach;with family/visitor present;Other (comment) (on toilet) Nurse Communication: Mobility status;Precautions (on toilet) PT  Visit Diagnosis: Other abnormalities of gait and mobility (R26.89)     Time: 8970-8950 PT Time Calculation (min) (ACUTE ONLY): 20 min  Charges:    $Therapeutic Exercise: 8-22 mins PT General Charges $$ ACUTE PT VISIT: 1 Visit                     Shannette Tabares P., PTA Acute Rehabilitation Services Secure Chat Preferred 9a-5:30pm Office: [phone]    Connell HERO [employer] 09/28/2023, 12:46 PM

## 2023-09-28 NOTE — Progress Notes (Signed)
 CARDIAC REHAB PHASE I   Postop OHS education completed. Referral sent to [facility] for CRP2. Plan for discharge home later today.   Vaughn Asberry Hacking, RN BSN 09/28/2023 11:47 AM

## 2023-09-28 NOTE — Progress Notes (Addendum)
 [address] [place] [place] [address]             [phone]      6 Days Post-Op Procedure(s) (LRB): REPLACEMENT, AORTIC VALVE, OPEN UTILIZING INSPIRIS RESILIA AORTIC VALVE (N/A) ECHOCARDIOGRAM, TRANSESOPHAGEAL, INTRAOPERATIVE (N/A) Subjective: Patient reports he does not want to stay in the hospital anymore and we aren't doing anything for him here that he can't do at home.   Objective: Vital signs in last 24 hours: Temp:  [97.5 F (36.4 C)-97.8 F (36.6 C)] 97.5 F (36.4 C) (06/25 0312) Pulse Rate:  [66-71] 66 (06/25 0312) Cardiac Rhythm: Normal sinus rhythm (06/24 1904) Resp:  [14-20] 14 (06/25 0339) BP: (103-139)/(67-98) 139/98 (06/25 0312) SpO2:  [93 %-94 %] 93 % (06/25 0312) Weight:  [95.1 kg] 95.1 kg (06/25 0339)  Hemodynamic parameters for last 24 hours:    Intake/Output from previous day: No intake/output data recorded. Intake/Output this shift: No intake/output data recorded.  General appearance: alert, cooperative, and no distress Neurologic: intact Heart: regular rate and rhythm, S1, S2 normal, no murmur, click, rub or gallop Lungs: clear to auscultation bilaterally Abdomen: soft, non-tender; bowel sounds normal; no masses,  no organomegaly Extremities: edema 2+ BLE, blisters on both shins, no acute erythema, not warm to touch or sign of cellulitis Wound: Clean and dry sternal incision without sign of infection  Lab Results: Recent Labs    09/27/23 0313 09/28/23 0334  WBC 12.5* 11.3*  HGB 10.4* 11.0*  HCT 30.4* 33.3*  PLT 250 299   BMET:  Recent Labs    09/27/23 0313 09/28/23 0334  NA 138 139  K 3.5 4.1  CL 98 96*  CO2 30 32  GLUCOSE 50* 90  BUN 57* 54*  CREATININE 1.39* 1.51*  CALCIUM  8.0* 8.4*    PT/INR: No results for input(s): LABPROT, INR in the last 72 hours. ABG    Component Value Date/Time   PHART 7.260 (L) 09/22/2023 1758   HCO3 20.2 09/22/2023 1758   TCO2 22 09/22/2023 1758   ACIDBASEDEF  7.0 (H) 09/22/2023 1758   O2SAT 91 09/22/2023 1758   CBG (last 3)  Recent Labs    09/27/23 2309 09/28/23 0309 09/28/23 0601  GLUCAP 100* 100* 172*    Assessment/Plan: S/P Procedure(s) (LRB): REPLACEMENT, AORTIC VALVE, OPEN UTILIZING INSPIRIS RESILIA AORTIC VALVE (N/A) ECHOCARDIOGRAM, TRANSESOPHAGEAL, INTRAOPERATIVE (N/A)  CV: BP controlled, SBP mostly 100-120s, BP 139/98 this AM. NSR, HR 70s with some PVCs, and an asymptomatic 9 beat run of NSVT. On Toprol  XL 25mg  daily. ST elevations on tele due to left bundle branch block stable from last EKG, no sign of pericarditis.   Pulm: Saturating well on RA. Last CXR with small right pleural effusion. Continue home Torsemide . Encourage IS and ambulation.    GI: SLP following for silent aspiration of thin liquids, diet recommendation is a regular diet with nectar thick liquids but patient reports he is drinking broths and has not adhered to SLP recommendations. As discussed with SLP will arrange a 1 month MBS and outpatient follow up with SLP. +BMx2 yesterday and BM this AM. Tolerating a diet.    Endo: T2DM, preop A1C 8.3. CBGs controlled on Semglee  20U BID, Novolog  15U TID  and SSI. Did have an elevated CBG at 316 yesterday followed by 88 and 41. Has been 100/100/172 since then. Will need close endocrinology follow up as an outpatient.    Renal: AKI on CKD stage II,  Cr 1.51 this AM slightly bumped from 1.39 yesterday. Looks like baseline creatinine is 1.2-1.3. No UO recorded. +9lbs from preop weight. Continue home Torsemide  20mg  BID. Patient reports Lasix  usually works better. May benefit from a dose of IV Lasix  this AM. Patient removed TED hose, continue TED hose as able. K 4.1, at goal.    ID: Likely reactive leukocytosis trending down, WBC 11.3. Afebrile. No sign of infection. Clinically monitor.    Expected postop ABLA: H/H 11/33.3, trending up. Not clinically significant.   DVT Prophylaxis: Lovenox    Deconditioning: Ambulated 3-4  times yesterday, PT/OT recommending HH PT/OT. Continue work with PT/OT.   Dispo: Patient is still about +9lbs from preop weight and lower extremities are very edematous with blisters on his shins. He reports swelling and pain has improved. He does not want to stay in the hospital any longer and is adamant about going home today. Will discuss dispo with Dr. Kerrin.    LOS: 12 days    Con GORMAN Bend, PA-C 09/28/2023  Patient seen and examined, agree with above Peripheral edema is actually a little worse Agree with IV Lasix  Needs Unna boots replaced Will need HHRN to change twice weekly  Elspeth C. Kerrin, MD [facility] [phone]

## 2023-09-29 ENCOUNTER — Other Ambulatory Visit (HOSPITAL_COMMUNITY): Payer: Self-pay | Admitting: Physician Assistant

## 2023-09-29 DIAGNOSIS — R1310 Dysphagia, unspecified: Secondary | ICD-10-CM

## 2023-09-29 MED FILL — Sodium Bicarbonate IV Soln 8.4%: INTRAVENOUS | Qty: 50 | Status: AC

## 2023-09-29 MED FILL — Potassium Chloride Inj 2 mEq/ML: INTRAVENOUS | Qty: 40 | Status: AC

## 2023-09-29 MED FILL — Sodium Chloride IV Soln 0.9%: INTRAVENOUS | Qty: 2000 | Status: AC

## 2023-09-29 MED FILL — Calcium Chloride Inj 10%: INTRAVENOUS | Qty: 10 | Status: AC

## 2023-09-29 MED FILL — Heparin Sodium (Porcine) Inj 1000 Unit/ML: Qty: 1000 | Status: AC

## 2023-09-29 MED FILL — Lidocaine HCl Local Preservative Free (PF) Inj 2%: INTRAMUSCULAR | Qty: 14 | Status: CN

## 2023-09-29 MED FILL — Mannitol IV Soln 20%: INTRAVENOUS | Qty: 500 | Status: AC

## 2023-09-29 MED FILL — Electrolyte-R (PH 7.4) Solution: INTRAVENOUS | Qty: 4000 | Status: AC

## 2023-09-29 MED FILL — Lidocaine HCl Local Preservative Free (PF) Inj 2%: INTRAMUSCULAR | Qty: 14 | Status: AC

## 2023-09-29 MED FILL — Heparin Sodium (Porcine) Inj 1000 Unit/ML: INTRAMUSCULAR | Qty: 20 | Status: AC

## 2023-09-30 ENCOUNTER — Telehealth: Payer: Self-pay | Admitting: Cardiology

## 2023-09-30 ENCOUNTER — Telehealth: Payer: Self-pay | Admitting: Physician Assistant

## 2023-09-30 NOTE — TOC CM/SW Note (Signed)
 Post discharge note 09/30/23 1530 CM has been working on trying to secure [facility] for this pt since discharge on 6/25-  -[facility] confirmed today that they are OON with pt's insurance and he has no OON benefits for [facility]- they will not be able to service.  -[facility]- does not service [facility]- unable to service for needs.  -[facility]- unable to accept unable to met current needs.   CM has not found a HH to accept for needed HH- no HH accepting referral (RN/PT/OT)  CM has reached out to update [facility] PA B. Chambers.  CM also made TC to pt to update- VM left with CM contact # if pt has any questions.

## 2023-09-30 NOTE — Telephone Encounter (Signed)
 Patient is calling wanting to check on the status of disability forms he reports his job has faxed to Dr. Michele. Please advise.

## 2023-09-30 NOTE — Telephone Encounter (Signed)
      [address] [place] [address]             [phone]      Rayfield Gobble reached out to me reporting she has been working on arranging home health for this patient since Wednesday and she cannot find a home health agency that will see him in [place]. She is calling the patient to update him now. We will remove his unna boots in the office next week, he has been told to leave them in place until then.  Con GORMAN Bend, PA-C

## 2023-09-30 NOTE — Telephone Encounter (Signed)
 Attempted to speak with pt, unable to reach. LVM. Explained to pt that with recent valve replacement, [facility] office will need to complete any disability paperwork since we were not involved with that aspect of care for the pt. Explained to pt that our office has also not been sent any disability paperwork as of now but if we do get the paperwork, we will redirect it to [facility]. Pt given [facility] office number if he has any questions or concerns. Pt also told to call our office with any questions or concerns.

## 2023-10-03 ENCOUNTER — Telehealth (HOSPITAL_COMMUNITY): Payer: Self-pay

## 2023-10-03 NOTE — Telephone Encounter (Signed)
Per phase I cardiac rehab, fax referral to [facility].

## 2023-10-04 ENCOUNTER — Other Ambulatory Visit: Payer: Self-pay | Admitting: Thoracic Surgery (Cardiothoracic Vascular Surgery)

## 2023-10-04 DIAGNOSIS — I350 Nonrheumatic aortic (valve) stenosis: Secondary | ICD-10-CM

## 2023-10-04 NOTE — Progress Notes (Signed)
 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 Kerrin earlier this year for management of severe aortic stenosis.  He ultimately had bioprosthetic aortic valve replacement on 09/22/2023 by Dr. Kerrin with a 23 mm Edwards Inspiris Resilia pericardial tissue valve.  The postoperative course was notable for silent aspiration early after surgery with improvement of his swallowing function by the time of his discharge.  He also had acute on chronic renal insufficiency postoperatively with a peak creatinine of 1.73.  He was diuresed aggressively postoperatively for significant volume excess with torsemide .  He was discharged on torsemide  with Unna boots in place to mitigate the lower extremity edema.  We attempted to arrange home health nursing along with home physical and occupational therapies but we could not get an agency to agree to come to his home located in [place] .  He was discharged home on 09/28/2023 and asked to return today for a postoperative checkup with Bmet and to evaluate whether or not he needs to continue use of the Unna boots.  Mr. Closson presented to the office today with his wife.  Overall, he feels like he is making progress since his discharge to home.  He has been taking medications as prescribed and notes that he is lost a lot of water weight.  He had no difficulty swallowing since his discharge.  From an activity standpoint, he is primarily just been walking around inside the house.  He has not had any significant shortness of breath and the parasternal pain has been well-controlled.  He did develop a lot of discomfort related to the Unna boots and he took them off himself 2 days ago.  Since then, he has had persistent watery drainage from blisters on the anterior surface of both shins.     Current  Outpatient Medications  Medication Sig Dispense Refill   acetaminophen  (TYLENOL ) 325 MG tablet Take 2 tablets (650 mg total) by mouth every 6 (six) hours as needed for mild pain (pain score 1-3).     aspirin  EC 325 MG tablet Take 1 tablet (325 mg total) by mouth daily. 30 tablet 3   atorvastatin  (LIPITOR) 40 MG tablet Take 1 tablet (40 mg total) by mouth at bedtime. 90 tablet 3   gabapentin  (NEURONTIN ) 100 MG capsule Take 2 capsules (200 mg total) by mouth 2 (two) times daily. If you feel you no longer require this medication for pain please DO NOT stop abruptly. Please wean to 1 tablet daily for 1 week and then discontinue. 60 capsule 1   insulin  lispro (HUMALOG ) 100 UNIT/ML KwikPen Inject 16 Units into the skin 3 (three) times daily.     metoprolol  succinate (TOPROL  XL) 25 MG 24 hr tablet Take 1 tablet (25 mg total) by mouth daily. 60 tablet 1   oxyCODONE  (OXY IR/ROXICODONE ) 5 MG immediate release tablet Take 1 tablet (5 mg total) by mouth every 6 (six) hours as needed for severe pain (pain score 7-10). 28 tablet 0   OZEMPIC, 0.25 OR 0.5 MG/DOSE, 2 MG/3ML SOPN Inject 0.25 mg into the skin once a week.     torsemide  (DEMADEX ) 20 MG tablet Take 1 tablet (20 mg total) by mouth 2 (two) times daily. 60 tablet 1   TRESIBA FLEXTOUCH 200 UNIT/ML FlexTouch Pen Inject 54 Units into the skin daily.     No current facility-administered medications  for this visit.    Physical Exam Vital signs BP 127/81 Heart rate 70 Respirations 18 SpO2 96% on room air  General: 58 year old male in no acute distress. Head: normocephalic Neck: No JVD HEENT: Unremarkable Heart: Regular rate rhythm.  Expected soft systolic murmur consistent with the bioprosthetic aortic valve. Chest: Breath sounds are full, equal, and clear to auscultation.  The sternotomy incision is intact and healing with no sign of complication as are the chest tube sites. Abdomen: Obese, soft and non-tender Extremities: All well-perfused with  palpable distal pulses.  There is marked edema in both lower extremities from the knees to the ankles.  There are several areas of nonblanching erythema on both anterior lower legs.  There are a few raised blisters and also evidence of previously ruptured blisters with persistent weeping of clear fluid that has saturated both of his socks.   Neuro: Grossly normal  Diagnostic Tests: CLINICAL DATA:  Status post aortic valve replacement   EXAM: CHEST - 2 VIEW   COMPARISON:  09/26/2023   FINDINGS: Cardiomediastinal silhouette and pulmonary vasculature are within normal limits.   Prosthetic aortic valve again seen.   Small RIGHT pleural effusion is present.   Unchanged compression deformities of the T10 and L2 vertebral bodies.   IMPRESSION: Unchanged small RIGHT pleural effusion.     Electronically Signed   By: Aliene Lloyd M.D.   On: 10/05/2023 12:40    Impression / Plan:  - Persistent bilateral lower extremity edema with open draining wounds is a 58 year old male who is about 2 weeks post bioprosthetic aortic valve replacement for severe aortic stenosis.  I am very concerned about the risk of infection both in the leg wounds and in the bioprosthetic valve.  We were attempting to arrange for home health nursing to assist with wound care but services are not available in the region of [place]  where Mr. Magowan lives.  Will readmit to the hospital for aggressive diuresis and wound care.  We will obtain a venous duplex scan to to ensure there are no lower extremity venous obstructions.  -Type 2 diabetes mellitus, on insulin : Mr. Toledo admits that his blood glucose has been running in the 200-250 range at home.  Will restart his usual medications and also add sliding scale insulin .  Will request diabetes coordinator consult to assist with management.  - Acute on chronic kidney disease: Postop creatinine peaked at 1.77 after recent surgery.  Follow-up BMP has been drawn and results are  pending.  Will monitor while in the hospital  - History of hypertension: Well-controlled on current regimen  -Dyslipidemia: On atorvastatin   Laurel JUDITHANN Becket, PA-C [facility] [phone]

## 2023-10-05 ENCOUNTER — Ambulatory Visit: Attending: Thoracic Surgery (Cardiothoracic Vascular Surgery) | Admitting: Physician Assistant

## 2023-10-05 ENCOUNTER — Inpatient Hospital Stay (HOSPITAL_COMMUNITY)
Admission: AD | Admit: 2023-10-05 | Discharge: 2023-10-15 | DRG: 291 | Disposition: A | Discharge status: Home / Self Care | Source: Ambulatory Visit | Attending: Thoracic Surgery (Cardiothoracic Vascular Surgery) | Admitting: Thoracic Surgery (Cardiothoracic Vascular Surgery)

## 2023-10-05 ENCOUNTER — Ambulatory Visit (HOSPITAL_COMMUNITY)
Admission: RE | Admit: 2023-10-05 | Discharge: 2023-10-05 | Disposition: A | Discharge status: Home / Self Care | Source: Ambulatory Visit | Attending: Cardiology | Admitting: Cardiology

## 2023-10-05 ENCOUNTER — Encounter (HOSPITAL_COMMUNITY): Payer: Self-pay

## 2023-10-05 ENCOUNTER — Inpatient Hospital Stay (HOSPITAL_COMMUNITY)

## 2023-10-05 VITALS — BP 127/81 | HR 70 | Resp 18 | Ht 65.0 in | Wt 202.0 lb

## 2023-10-05 DIAGNOSIS — Z7901 Long term (current) use of anticoagulants: Secondary | ICD-10-CM | POA: Diagnosis not present

## 2023-10-05 DIAGNOSIS — Z953 Presence of xenogenic heart valve: Secondary | ICD-10-CM | POA: Diagnosis not present

## 2023-10-05 DIAGNOSIS — I83028 Varicose veins of left lower extremity with ulcer other part of lower leg: Secondary | ICD-10-CM | POA: Diagnosis present

## 2023-10-05 DIAGNOSIS — I472 Ventricular tachycardia, unspecified: Secondary | ICD-10-CM | POA: Diagnosis not present

## 2023-10-05 DIAGNOSIS — E1151 Type 2 diabetes mellitus with diabetic peripheral angiopathy without gangrene: Secondary | ICD-10-CM | POA: Diagnosis not present

## 2023-10-05 DIAGNOSIS — Z952 Presence of prosthetic heart valve: Secondary | ICD-10-CM | POA: Diagnosis not present

## 2023-10-05 DIAGNOSIS — Z6832 Body mass index (BMI) 32.0-32.9, adult: Secondary | ICD-10-CM

## 2023-10-05 DIAGNOSIS — R609 Edema, unspecified: Secondary | ICD-10-CM | POA: Diagnosis not present

## 2023-10-05 DIAGNOSIS — Z881 Allergy status to other antibiotic agents status: Secondary | ICD-10-CM

## 2023-10-05 DIAGNOSIS — N183 Chronic kidney disease, stage 3 unspecified: Secondary | ICD-10-CM | POA: Diagnosis present

## 2023-10-05 DIAGNOSIS — Z794 Long term (current) use of insulin: Secondary | ICD-10-CM

## 2023-10-05 DIAGNOSIS — E11649 Type 2 diabetes mellitus with hypoglycemia without coma: Secondary | ICD-10-CM | POA: Diagnosis not present

## 2023-10-05 DIAGNOSIS — L03116 Cellulitis of left lower limb: Secondary | ICD-10-CM | POA: Diagnosis present

## 2023-10-05 DIAGNOSIS — Z79899 Other long term (current) drug therapy: Secondary | ICD-10-CM

## 2023-10-05 DIAGNOSIS — S80821A Blister (nonthermal), right lower leg, initial encounter: Secondary | ICD-10-CM | POA: Diagnosis not present

## 2023-10-05 DIAGNOSIS — E785 Hyperlipidemia, unspecified: Secondary | ICD-10-CM | POA: Diagnosis present

## 2023-10-05 DIAGNOSIS — I83018 Varicose veins of right lower extremity with ulcer other part of lower leg: Secondary | ICD-10-CM | POA: Diagnosis present

## 2023-10-05 DIAGNOSIS — Z7982 Long term (current) use of aspirin: Secondary | ICD-10-CM

## 2023-10-05 DIAGNOSIS — I428 Other cardiomyopathies: Secondary | ICD-10-CM | POA: Diagnosis present

## 2023-10-05 DIAGNOSIS — I4891 Unspecified atrial fibrillation: Secondary | ICD-10-CM | POA: Diagnosis not present

## 2023-10-05 DIAGNOSIS — R600 Localized edema: Secondary | ICD-10-CM | POA: Diagnosis not present

## 2023-10-05 DIAGNOSIS — S80822A Blister (nonthermal), left lower leg, initial encounter: Secondary | ICD-10-CM | POA: Diagnosis not present

## 2023-10-05 DIAGNOSIS — Z91199 Patient's noncompliance with other medical treatment and regimen due to unspecified reason: Secondary | ICD-10-CM

## 2023-10-05 DIAGNOSIS — I130 Hypertensive heart and chronic kidney disease with heart failure and stage 1 through stage 4 chronic kidney disease, or unspecified chronic kidney disease: Principal | ICD-10-CM | POA: Diagnosis present

## 2023-10-05 DIAGNOSIS — I447 Left bundle-branch block, unspecified: Secondary | ICD-10-CM | POA: Diagnosis present

## 2023-10-05 DIAGNOSIS — E1122 Type 2 diabetes mellitus with diabetic chronic kidney disease: Secondary | ICD-10-CM | POA: Diagnosis not present

## 2023-10-05 DIAGNOSIS — I77810 Thoracic aortic ectasia: Secondary | ICD-10-CM | POA: Diagnosis not present

## 2023-10-05 DIAGNOSIS — I878 Other specified disorders of veins: Secondary | ICD-10-CM | POA: Diagnosis present

## 2023-10-05 DIAGNOSIS — E669 Obesity, unspecified: Secondary | ICD-10-CM | POA: Diagnosis not present

## 2023-10-05 DIAGNOSIS — E1159 Type 2 diabetes mellitus with other circulatory complications: Secondary | ICD-10-CM | POA: Diagnosis not present

## 2023-10-05 DIAGNOSIS — Z7985 Long-term (current) use of injectable non-insulin antidiabetic drugs: Secondary | ICD-10-CM | POA: Diagnosis not present

## 2023-10-05 DIAGNOSIS — E1165 Type 2 diabetes mellitus with hyperglycemia: Secondary | ICD-10-CM | POA: Diagnosis present

## 2023-10-05 DIAGNOSIS — L03115 Cellulitis of right lower limb: Secondary | ICD-10-CM | POA: Diagnosis present

## 2023-10-05 DIAGNOSIS — Z833 Family history of diabetes mellitus: Secondary | ICD-10-CM

## 2023-10-05 DIAGNOSIS — I4892 Unspecified atrial flutter: Secondary | ICD-10-CM | POA: Diagnosis not present

## 2023-10-05 DIAGNOSIS — L97829 Non-pressure chronic ulcer of other part of left lower leg with unspecified severity: Secondary | ICD-10-CM | POA: Diagnosis present

## 2023-10-05 DIAGNOSIS — L97819 Non-pressure chronic ulcer of other part of right lower leg with unspecified severity: Secondary | ICD-10-CM | POA: Diagnosis present

## 2023-10-05 DIAGNOSIS — N182 Chronic kidney disease, stage 2 (mild): Secondary | ICD-10-CM | POA: Diagnosis not present

## 2023-10-05 DIAGNOSIS — I5021 Acute systolic (congestive) heart failure: Secondary | ICD-10-CM | POA: Diagnosis not present

## 2023-10-05 DIAGNOSIS — N289 Disorder of kidney and ureter, unspecified: Secondary | ICD-10-CM | POA: Diagnosis not present

## 2023-10-05 DIAGNOSIS — I350 Nonrheumatic aortic (valve) stenosis: Secondary | ICD-10-CM | POA: Insufficient documentation

## 2023-10-05 DIAGNOSIS — N179 Acute kidney failure, unspecified: Secondary | ICD-10-CM | POA: Diagnosis present

## 2023-10-05 DIAGNOSIS — Z8249 Family history of ischemic heart disease and other diseases of the circulatory system: Secondary | ICD-10-CM

## 2023-10-05 DIAGNOSIS — J9811 Atelectasis: Secondary | ICD-10-CM | POA: Diagnosis present

## 2023-10-05 DIAGNOSIS — I493 Ventricular premature depolarization: Secondary | ICD-10-CM | POA: Diagnosis present

## 2023-10-05 DIAGNOSIS — I5023 Acute on chronic systolic (congestive) heart failure: Secondary | ICD-10-CM | POA: Diagnosis present

## 2023-10-05 DIAGNOSIS — J90 Pleural effusion, not elsewhere classified: Secondary | ICD-10-CM | POA: Diagnosis not present

## 2023-10-05 DIAGNOSIS — Z888 Allergy status to other drugs, medicaments and biological substances status: Secondary | ICD-10-CM

## 2023-10-05 LAB — GLUCOSE, CAPILLARY
Glucose-Capillary: 237 mg/dL — ABNORMAL HIGH (ref 70–99)
Glucose-Capillary: 228 mg/dL — ABNORMAL HIGH (ref 70–99)

## 2023-10-05 LAB — BASIC METABOLIC PANEL WITH GFR
Anion gap: 15 (ref 5–15)
BUN: 41 mg/dL — ABNORMAL HIGH (ref 6–20)
CO2: 33 mmol/L — ABNORMAL HIGH (ref 22–32)
Calcium: 9.1 mg/dL (ref 8.9–10.3)
Chloride: 90 mmol/L — ABNORMAL LOW (ref 98–111)
Creatinine, Ser: 1.6 mg/dL — ABNORMAL HIGH (ref 0.61–1.24)
GFR, Estimated: 50 mL/min — ABNORMAL LOW (ref >60)
Glucose, Bld: 232 mg/dL — ABNORMAL HIGH (ref 70–99)
Potassium: 4.2 mmol/L (ref 3.5–5.1)
Sodium: 138 mmol/L (ref 135–145)

## 2023-10-05 LAB — HEPATIC FUNCTION PANEL
ALT: 12 U/L (ref 0–44)
AST: 19 U/L (ref 15–41)
Albumin: 2.7 g/dL — ABNORMAL LOW (ref 3.5–5.0)
Alkaline Phosphatase: 66 U/L (ref 38–126)
Bilirubin, Direct: 0.2 mg/dL (ref 0.0–0.2)
Indirect Bilirubin: 0.4 mg/dL (ref 0.3–0.9)
Total Bilirubin: 0.6 mg/dL (ref 0.0–1.2)
Total Protein: 7 g/dL (ref 6.5–8.1)

## 2023-10-05 LAB — CBC
HCT: 37.2 % — ABNORMAL LOW (ref 39.0–52.0)
Hemoglobin: 12.4 g/dL — ABNORMAL LOW (ref 13.0–17.0)
MCH: 28.5 pg (ref 26.0–34.0)
MCHC: 33.3 g/dL (ref 30.0–36.0)
MCV: 85.5 fL (ref 80.0–100.0)
Platelets: 424 10*3/uL — ABNORMAL HIGH (ref 150–400)
RBC: 4.35 MIL/uL (ref 4.22–5.81)
RDW: 12.6 % (ref 11.5–15.5)
WBC: 15.1 10*3/uL — ABNORMAL HIGH (ref 4.0–10.5)
nRBC: 0 % (ref 0.0–0.2)

## 2023-10-05 LAB — BRAIN NATRIURETIC PEPTIDE: B Natriuretic Peptide: 459.1 pg/mL — ABNORMAL HIGH (ref 0.0–100.0)

## 2023-10-05 MED ORDER — GABAPENTIN 100 MG PO CAPS
100.0000 mg | ORAL_CAPSULE | Freq: Two times a day (BID) | ORAL | Status: DC
  Administered 2023-10-05 – 2023-10-15 (×20): 100 mg via ORAL
  Filled 2023-10-05 (×20): qty 1

## 2023-10-05 MED ORDER — ASPIRIN 325 MG PO TABS: 325.0000 mg | ORAL_TABLET | Freq: Every day | ORAL | Status: DC

## 2023-10-05 MED ORDER — INSULIN ASPART 100 UNIT/ML IJ SOLN
15.0000 [IU] | Freq: Three times a day (TID) | INTRAMUSCULAR | Status: DC
  Administered 2023-10-06: 15 [IU] via SUBCUTANEOUS

## 2023-10-05 MED ORDER — DOCUSATE SODIUM 100 MG PO CAPS
100.0000 mg | ORAL_CAPSULE | Freq: Two times a day (BID) | ORAL | Status: DC
  Administered 2023-10-05 – 2023-10-12 (×10): 100 mg via ORAL
  Filled 2023-10-05 (×17): qty 1

## 2023-10-05 MED ORDER — ASPIRIN 81 MG PO TBEC
81.0000 mg | DELAYED_RELEASE_TABLET | Freq: Every day | ORAL | Status: DC
  Administered 2023-10-05 – 2023-10-06 (×2): 81 mg via ORAL
  Filled 2023-10-05 (×2): qty 1

## 2023-10-05 MED ORDER — INSULIN GLARGINE-YFGN 100 UNIT/ML ~~LOC~~ SOLN
20.0000 [IU] | Freq: Two times a day (BID) | SUBCUTANEOUS | Status: DC
Start: 2023-10-05 — End: 2023-10-06
  Administered 2023-10-05 – 2023-10-06 (×2): 20 [IU] via SUBCUTANEOUS
  Filled 2023-10-05 (×3): qty 0.2

## 2023-10-05 MED ORDER — OXYCODONE HCL 5 MG PO TABS: 5.0000 mg | ORAL_TABLET | ORAL | Status: DC | PRN

## 2023-10-05 MED ORDER — ACETAMINOPHEN 650 MG RE SUPP: 650.0000 mg | Freq: Four times a day (QID) | RECTAL | Status: DC | PRN

## 2023-10-05 MED ORDER — INSULIN ASPART 100 UNIT/ML IJ SOLN
0.0000 [IU] | Freq: Three times a day (TID) | INTRAMUSCULAR | Status: DC
  Administered 2023-10-05: 5 [IU] via SUBCUTANEOUS
  Administered 2023-10-06: 11 [IU] via SUBCUTANEOUS

## 2023-10-05 MED ORDER — SODIUM CHLORIDE 0.9% FLUSH
3.0000 mL | Freq: Two times a day (BID) | INTRAVENOUS | Status: DC
  Administered 2023-10-05 – 2023-10-15 (×17): 3 mL via INTRAVENOUS

## 2023-10-05 MED ORDER — ATORVASTATIN CALCIUM 40 MG PO TABS
40.0000 mg | ORAL_TABLET | Freq: Every day | ORAL | Status: DC
  Administered 2023-10-05 – 2023-10-14 (×10): 40 mg via ORAL
  Filled 2023-10-05 (×10): qty 1

## 2023-10-05 MED ORDER — FUROSEMIDE 10 MG/ML IJ SOLN
40.0000 mg | Freq: Once | INTRAMUSCULAR | Status: AC
  Administered 2023-10-05: 40 mg via INTRAVENOUS
  Filled 2023-10-05: qty 4

## 2023-10-05 MED ORDER — FUROSEMIDE 10 MG/ML IJ SOLN
40.0000 mg | Freq: Two times a day (BID) | INTRAMUSCULAR | Status: DC
  Administered 2023-10-05: 40 mg via INTRAVENOUS
  Filled 2023-10-05: qty 4

## 2023-10-05 MED ORDER — INSULIN ASPART 100 UNIT/ML IJ SOLN
0.0000 [IU] | Freq: Every day | INTRAMUSCULAR | Status: DC
  Administered 2023-10-05: 2 [IU] via SUBCUTANEOUS

## 2023-10-05 MED ORDER — ENOXAPARIN SODIUM 40 MG/0.4ML IJ SOSY
40.0000 mg | PREFILLED_SYRINGE | INTRAMUSCULAR | Status: DC
  Administered 2023-10-05 – 2023-10-12 (×8): 40 mg via SUBCUTANEOUS
  Filled 2023-10-05 (×8): qty 0.4

## 2023-10-05 MED ORDER — FUROSEMIDE 10 MG/ML IJ SOLN: 40.0000 mg | Freq: Every day | INTRAMUSCULAR | Status: DC

## 2023-10-05 MED ORDER — ACETAMINOPHEN 325 MG PO TABS: 650.0000 mg | ORAL_TABLET | Freq: Four times a day (QID) | ORAL | Status: DC | PRN

## 2023-10-05 MED ORDER — METOPROLOL SUCCINATE ER 25 MG PO TB24
25.0000 mg | ORAL_TABLET | Freq: Every day | ORAL | Status: DC
  Administered 2023-10-05 – 2023-10-15 (×11): 25 mg via ORAL
  Filled 2023-10-05 (×11): qty 1

## 2023-10-05 NOTE — Consult Note (Addendum)
 Advanced Heart Failure Team Consult Note   Primary Physician: Rosamond Leta NOVAK, MD Cardiologist:  Madonna Large, DO  Reason for Consultation: A/C CHF HPI:    Jonathon Bray is seen today for evaluation of acute on chronic systolic heart failure at the request of Dr. Lucas.   58 y.o. male with history of HFrEF, HTN, T2DM, CKD 2, and aortic stenosis s/p bioprothestic AVR with Dr. Kerrin on 09/22/23. Echo post-op with EF 50% and nl RV, previously 20-25%.  Since procedure he has been struggling with significant lower extremity edema. He was discharge home with unna boots and torsemide . Attempts were made to have home health nursing and PT see him, however they were unable to visit since he lives in [place] .   He was seen today for post-op follow up in [facility] with continued significant lower extremity volume now with weeping and blisters. Admitted for IV diuresis and further evaluation. Advanced Heart Failure consulted.   Vitals on admission: BP 117/76, HR 84, normotensive. Labs and imaging pending. Bilateral venous duplex ordered. CXR ordered. Repeat Echo ordered.   Home Medications Prior to Admission medications   Medication Sig Start Date End Date Taking? Authorizing Provider  acetaminophen  (TYLENOL ) 325 MG tablet Take 2 tablets (650 mg total) by mouth every 6 (six) hours as needed for mild pain (pain score 1-3). 09/28/23   Raguel Con RAMAN, PA-C  aspirin  EC 325 MG tablet Take 1 tablet (325 mg total) by mouth daily. 09/28/23   Raguel Con RAMAN, PA-C  atorvastatin  (LIPITOR) 40 MG tablet Take 1 tablet (40 mg total) by mouth at bedtime. 06/02/23   Tolia, Sunit, DO  gabapentin  (NEURONTIN ) 100 MG capsule Take 2 capsules (200 mg total) by mouth 2 (two) times daily. If you feel you no longer require this medication for pain please DO NOT stop abruptly. Please wean to 1 tablet daily for 1 week and then discontinue. 09/28/23   Raguel Con RAMAN, PA-C  insulin  lispro (HUMALOG ) 100 UNIT/ML  KwikPen Inject 16 Units into the skin 3 (three) times daily. 08/08/17   [provider]  metoprolol  succinate (TOPROL  XL) 25 MG 24 hr tablet Take 1 tablet (25 mg total) by mouth daily. 09/29/23   Raguel Con RAMAN, PA-C  oxyCODONE  (OXY IR/ROXICODONE ) 5 MG immediate release tablet Take 1 tablet (5 mg total) by mouth every 6 (six) hours as needed for severe pain (pain score 7-10). 09/28/23   Raguel Con RAMAN, PA-C  OZEMPIC, 0.25 OR 0.5 MG/DOSE, 2 MG/3ML SOPN Inject 0.25 mg into the skin once a week. 08/08/23   [provider]  torsemide  (DEMADEX ) 20 MG tablet Take 1 tablet (20 mg total) by mouth 2 (two) times daily. 09/12/23   Garrick Leontine SAILOR, PA-C  TRESIBA FLEXTOUCH 200 UNIT/ML FlexTouch Pen Inject 54 Units into the skin daily. 08/08/23   [provider]    Past Medical History: Past Medical History:  Diagnosis Date   Aortic stenosis    Diabetes mellitus without complication (HCC)    type two - LIBRE SYSTEM ON RIGHT UPPER OUTER ARM   Hypertension    PONV (postoperative nausea and vomiting)     Past Surgical History: Past Surgical History:  Procedure Laterality Date   AORTIC VALVE REPLACEMENT N/A 09/22/2023   Procedure: REPLACEMENT, AORTIC VALVE, OPEN UTILIZING INSPIRIS RESILIA AORTIC VALVE;  Surgeon: Kerrin Elspeth BROCKS, MD;  Location: [facility];  Service: Open Heart Surgery;  Laterality: N/A;   COLONOSCOPY  2020   CORONARY  ANGIOGRAPHY N/A 05/10/2022   Procedure: CORONARY ANGIOGRAPHY;  Surgeon: Ladona Heinz, MD;  Location: [facility];  Service: Cardiovascular;  Laterality: N/A;   INTRAOPERATIVE TRANSESOPHAGEAL ECHOCARDIOGRAM N/A 09/22/2023   Procedure: ECHOCARDIOGRAM, TRANSESOPHAGEAL, INTRAOPERATIVE;  Surgeon: Kerrin Elspeth BROCKS, MD;  Location: [facility] OR;  Service: Open Heart Surgery;  Laterality: N/A;   KYPHOPLASTY N/A 06/19/2020   Procedure: ATTEMPTED LUMBAR TWO KYPHOPLASTY;  Surgeon: Gillie Duncans, MD;  Location: [facility] OR;  Service: Neurosurgery;  Laterality: N/A;    RIGHT HEART CATH AND CORONARY ANGIOGRAPHY N/A 06/16/2023   Procedure: RIGHT HEART CATH AND CORONARY ANGIOGRAPHY;  Surgeon: Wendel Lurena POUR, MD;  [facility]: [facility];  Service: Cardiovascular;  Laterality: N/A;    Family History: Family History  Problem Relation Age of Onset   Diabetes Mother    Diabetes Father    Heart disease Father    Heart attack Sister    Hypertension Sister    Heart attack Brother    Other Brother 73       Shot in the head   Other Brother 5       Polio   Diabetes Brother     Social History: Social History   Socioeconomic History   Marital status: Married    Spouse name: Not on file   Number of children: 3   Years of education: Not on file   Highest education level: Not on file  Occupational History   Not on file  Tobacco Use   Smoking status: Never   Smokeless tobacco: Never  Vaping Use   Vaping status: Never Used  Substance and Sexual Activity   Alcohol use: No   Drug use: No   Sexual activity: Yes  Other Topics Concern   Not on file  Social History Narrative   Not on file   Social Drivers of Health   Financial Resource Strain: Not on file  Food Insecurity: No Food Insecurity (09/16/2023)   Hunger Vital Sign    Worried About Running Out of Food in the Last Year: Never true    Ran Out of Food in the Last Year: Never true  Transportation Needs: No Transportation Needs (09/16/2023)   PRAPARE - Administrator, Civil Service (Medical): No    Lack of Transportation (Non-Medical): No  Physical Activity: Not on file  Stress: Not on file  Social Connections: Unknown (08/17/2021)   Received from [facility]   Social Network    Social Network: Not on file    Allergies:  Allergies  Allergen Reactions   Invokana [Canagliflozin] Other (See Comments)    Dizziness Syncope    Levaquin [Levofloxacin] Rash   Zestril  [Lisinopril ] Cough   Farxiga  [Dapagliflozin ] Other (See Comments)    dizziness    Objective:     Vital Signs:   Temp:  [97.6 F (36.4 C)] 97.6 F (36.4 C) (07/02 1549) Pulse Rate:  [84] 84 (07/02 1549) Resp:  [17] 17 (07/02 1549) BP: (116)/(76) 116/76 (07/02 1549) SpO2:  [96 %] 96 % (07/02 1549)   Weight change: There were no vitals filed for this visit.  Intake/Output:  No intake or output data in the 24 hours ending 10/05/23 1636   Physical Exam    General: Well appearing. No distress on RA Cardiac: JVP difficult to assess. S1 and S2 present. 3/6 RUSB systolic murmur Resp: Lung sounds diminished Extremities: Warm and dry.  3+ pitting BLE edema. + numerous venous stasis ulcers Neuro: Alert and oriented x3. Affect  pleasant. Moves all extremities without difficulty.  Telemetry   SR in 80s (personally reviewed)  EKG    No new EKG to review  Labs   Basic Metabolic Panel: No results for input(s): NA, K, CL, CO2, GLUCOSE, BUN, CREATININE, CALCIUM , MG, PHOS in the last 168 hours.  Liver Function Tests: No results for input(s): AST, ALT, ALKPHOS, BILITOT, PROT, ALBUMIN  in the last 168 hours. No results for input(s): LIPASE, AMYLASE in the last 168 hours. No results for input(s): AMMONIA in the last 168 hours.  CBC: No results for input(s): WBC, NEUTROABS, HGB, HCT, MCV, PLT in the last 168 hours.  Cardiac Enzymes: No results for input(s): CKTOTAL, CKMB, CKMBINDEX, TROPONINI in the last 168 hours.  BNP: BNP (last 3 results) Recent Labs    12/07/22 0826 09/10/23 2142 09/16/23 1722  BNP 32.9 440.4* 376.4*   ProBNP (last 3 results) Recent Labs    06/06/23 1633 06/28/23 1402 07/27/23 1314  PROBNP 1,093* 1,172* 1,063*   CBG: No results for input(s): GLUCAP in the last 168 hours.  Coagulation Studies: No results for input(s): LABPROT, INR in the last 72 hours.  Imaging   DG Chest 2 View Result Date: 10/05/2023 CLINICAL DATA:  Status post aortic valve replacement EXAM: CHEST - 2 VIEW  COMPARISON:  09/26/2023 FINDINGS: Cardiomediastinal silhouette and pulmonary vasculature are within normal limits. Prosthetic aortic valve again seen. Small RIGHT pleural effusion is present. Unchanged compression deformities of the T10 and L2 vertebral bodies. IMPRESSION: Unchanged small RIGHT pleural effusion. Electronically Signed   By: Aliene Lloyd M.D.   On: 10/05/2023 12:40    Medications:    Current Medications:  aspirin  EC  81 mg Oral Daily   atorvastatin   40 mg Oral QHS   docusate sodium   100 mg Oral BID   enoxaparin  (LOVENOX ) injection  40 mg Subcutaneous Q24H   furosemide   40 mg Intravenous BID   gabapentin   100 mg Oral BID   insulin  aspart  0-15 Units Subcutaneous TID WC   insulin  aspart  0-5 Units Subcutaneous QHS   insulin  aspart  15 Units Subcutaneous TID WC   insulin  glargine-yfgn  20 Units Subcutaneous BID   metoprolol  succinate  25 mg Oral Daily   sodium chloride  flush  3 mL Intravenous Q12H    Infusions:   Patient Profile   58 y.o. male with history of HFrEF, HTN, T2DM, CKD 2, and aortic stenosis s/p bioprothestic AVR with Dr. Kerrin on 09/22/23.  Assessment/Plan   Lower extremity edema - presenting with severe BLE weeping edema and venous stasis ulcers/blisters; patient states that he had not had any issued with edema prior to procedure and has progressively gotten worse - suspect PVD contributing; venous duplex ordered - BNP/CMET ordered - TTE ordered - start Lasix  80 mg IV bid - place unna boots - wound care consult for venous stasis ulcer  2. HFimpEF - CMR 2/24: LVEF 57%, RVEF 60%, mod AS, consistent with myocarditis - R/LHC 3/25: normal cors; RA 19, PCWP 33 with vwaves to 45, PA 70/38 (52), PVR 4.5 - Echo 6/25: 20-25%, improved to 50% post AVR by TEE intra-op - Will get formal TTE  - continue toprol  XL 25 mg daily - plan to start spiro pending labs  3. Aortic Stenosis - s/p bioprosthetic AVR - mild prosthesis valve stenosis; peak gradient  25, mean gradient 14 - 3/6 RUSB systolic murmur present on exam - no dizziness noted - echo pending  4. CKD 2: baseline Cr 1.3-1.5 -  CMET pending  Length of Stay: 0  [place] Lee, NP  10/05/2023, 4:36 PM  Advanced Heart Failure Team Pager [phone] (M-F; 7a - 5p)  Please contact [facility] for night-coverage after hours (4p -7a ) and weekends on [email or web address]  Patient seen with NP, I formulated the plan and agree with the above note.   Patient had recent bioprosthetic AVR.  He was seen in cardiac surgery clinic today and noted to have marked lower extremity edema.  He was directly admitted for management/diuresis.  No labs yet, last creatinine from 09/28/23 was 1.5.  He denies dyspnea.   General: NAD Neck: JVP 14-16 cm, no thyromegaly or thyroid nodule.  Lungs: Clear to auscultation bilaterally with normal respiratory effort. CV: Nondisplaced PMI.  Heart regular S1/S2, no S3/S4, 2/6 early SEM RUSB.  2+ edema to thighs.  No carotid bruit.  Difficult to palpate pedal pulses.  Abdomen: Soft, nontender, no hepatosplenomegaly, no distention.  Skin: Intact without lesions or rashes.  Neurologic: Alert and oriented x 3.  Psych: Normal affect. Extremities: No clubbing or cyanosis.  HEENT: Normal.   1. Acute on chronic systolic CHF: Nonischemic cardiomyopathy.  ?Valvular related to severe AS versus LBBB cardiomyopathy.  Cath in pre-AVR showed no significant CAD but markedly elevated filling pressures.  Echo in 6/25 (pre-op) showed EF 20-25% with septal-lateral dyssynchrony, normal RV function, severe aortic stenosis.  Patient is now s/p bioprosthetic AVR.  Intra-op TEE reported EF 50% but no post-op echo was done.  On exam today, patient is markedly volume overloaded with JVD and peripheral edema.  BP stable.  Last creatinine 1.5, no labs yet today.  - Would start Lasix  80 mg IV bid.  - Need stat BMET - On low dose Toprol  XL 25 mg daily, can continue for now.  - Add other GDMT depending on  renal function, SBP currently in 110s.  - He needs an echo done.  I suspect that his EF remains low.  - Would benefit from CRT-D give LBBB if EF remains low a few months after AVR and medical optimization.  2. Aortic stenosis: S/p bioprosthetic AVR in 6/25.  - Echo to reassess valve.  3. CKD stage 3: BMET today.  4. Venous stasis ulcers: Wound care consult.   Ezra Shuck 10/05/2023 5:54 PM

## 2023-10-05 NOTE — Progress Notes (Signed)
 Patient arrived in the unit,CHG bath given,vitals checked,CCMD notified,pt oriented to  the unit,call bell in reach

## 2023-10-05 NOTE — Progress Notes (Signed)
 Dressing placed as per ordered pt tolerated well but later on he requested to take off ace wrap because of burning sensation,ace wrap removed, kerlex left in place

## 2023-10-05 NOTE — H&P (Cosign Needed)
 HISTORY AND PHYSICAL                                 Jonathon Bray is an 58 y.o. male. January 22, 1966 FMW:[id number]   Chief Complaint: Bilateral lower extremity edema with draining wounds.  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 Kerrin earlier this year for management of severe aortic stenosis.  He ultimately had bioprosthetic aortic valve replacement on 09/22/2023 by Dr. Kerrin with a 23 mm Edwards Inspiris Resilia pericardial tissue valve.  The postoperative course was notable for silent aspiration early after surgery with improvement of his swallowing function by the time of his discharge.  He also had acute on chronic renal insufficiency postoperatively with a peak creatinine of 1.73.  He was diuresed aggressively postoperatively for significant volume excess with torsemide .  He was discharged on torsemide  with Unna boots in place to mitigate the lower extremity edema.  We attempted to arrange home health nursing along with home physical and occupational therapies but we could not get an agency to agree to come to his home located in [place] .  He was discharged home on 09/28/2023 and asked to return today for a postoperative checkup with Bmet and to evaluate whether or not he needs to continue use of the Unna boots.   Jonathon Bray presented to the office today with his wife.  Overall, he feels like he is making progress since his discharge to home.  He has been taking medications as prescribed and notes that he is lost a lot of water weight.  He had no difficulty swallowing since his discharge.  From an activity standpoint, he is primarily just been walking around inside the house.  He has not had any significant shortness of breath and the parasternal pain has been  well-controlled.  He did develop a lot of discomfort related to the Unna boots and he took them off himself 2 days ago.  Since then, he has had persistent watery drainage from blisters on the anterior surface of both shins.   Past Medical History: Past Medical History:  Diagnosis Date   Aortic stenosis    Diabetes mellitus without complication (HCC)    type two - LIBRE SYSTEM ON RIGHT UPPER OUTER ARM   Hypertension    PONV (postoperative nausea and vomiting)     Past Surgical History: Past Surgical History:  Procedure Laterality Date   AORTIC VALVE REPLACEMENT N/A 09/22/2023   Procedure: REPLACEMENT, AORTIC VALVE, OPEN UTILIZING INSPIRIS RESILIA AORTIC VALVE;  Surgeon: Kerrin Elspeth BROCKS, MD;  Location: [facility] OR;  Service: Open Heart Surgery;  Laterality: N/A;   COLONOSCOPY  2020   CORONARY ANGIOGRAPHY N/A 05/10/2022   Procedure: CORONARY ANGIOGRAPHY;  Surgeon: Ladona Heinz, MD;  Location: [facility] INV[facility] LAB;  Service: Cardiovascular;  Laterality: N/A;   INTRAOPERATIVE TRANSESOPHAGEAL ECHOCARDIOGRAM N/A 09/22/2023   Procedure: ECHOCARDIOGRAM, TRANSESOPHAGEAL, INTRAOPERATIVE;  Surgeon: Kerrin Elspeth BROCKS, MD;  Location: [facility] OR;  Service: Open Heart Surgery;  Laterality: N/A;   KYPHOPLASTY N/A 06/19/2020   Procedure: ATTEMPTED LUMBAR TWO KYPHOPLASTY;  Surgeon: Gillie Duncans, MD;  Location: [facility] OR;  Service: Neurosurgery;  Laterality: N/A;   RIGHT HEART CATH AND CORONARY ANGIOGRAPHY N/A 06/16/2023   Procedure: RIGHT HEART CATH AND CORONARY ANGIOGRAPHY;  Surgeon:  Thukkani, Arun K, MD;  Location: [facility] LAB;  Service: Cardiovascular;  Laterality: N/A;    Family History: Family History  Problem Relation Age of Onset   Diabetes Mother    Diabetes Father    Heart disease Father    Heart attack Sister    Hypertension Sister    Heart attack Brother    Other Brother 23       Shot in the head   Other Brother 5       Polio   Diabetes Brother      Social History:   reports that  he has never smoked. He has never used smokeless tobacco. He reports that he does not drink alcohol and does not use drugs.  Allergies:  Allergies  Allergen Reactions   Invokana [Canagliflozin] Other (See Comments)    Dizziness Syncope    Levaquin [Levofloxacin] Rash   Zestril  [Lisinopril ] Cough   Farxiga  [Dapagliflozin ] Other (See Comments)    dizziness   Current Outpatient Medications  Medication Sig Dispense Refill   acetaminophen  (TYLENOL ) 325 MG tablet Take 2 tablets (650 mg total) by mouth every 6 (six) hours as needed for mild pain (pain score 1-3).       aspirin  EC 325 MG tablet Take 1 tablet (325 mg total) by mouth daily. 30 tablet 3   atorvastatin  (LIPITOR) 40 MG tablet Take 1 tablet (40 mg total) by mouth at bedtime. 90 tablet 3   gabapentin  (NEURONTIN ) 100 MG capsule Take 2 capsules (200 mg total) by mouth 2 (two) times daily. If you feel you no longer require this medication for pain please DO NOT stop abruptly. Please wean to 1 tablet daily for 1 week and then discontinue. 60 capsule 1   insulin  lispro (HUMALOG ) 100 UNIT/ML KwikPen Inject 16 Units into the skin 3 (three) times daily.       metoprolol  succinate (TOPROL  XL) 25 MG 24 hr tablet Take 1 tablet (25 mg total) by mouth daily. 60 tablet 1   oxyCODONE  (OXY IR/ROXICODONE ) 5 MG immediate release tablet Take 1 tablet (5 mg total) by mouth every 6 (six) hours as needed for severe pain (pain score 7-10). 28 tablet 0   OZEMPIC, 0.25 OR 0.5 MG/DOSE, 2 MG/3ML SOPN Inject 0.25 mg into the skin once a week.       torsemide  (DEMADEX ) 20 MG tablet Take 1 tablet (20 mg total) by mouth 2 (two) times daily. 60 tablet 1   TRESIBA FLEXTOUCH 200 UNIT/ML FlexTouch Pen Inject 54 Units into the skin daily.        Review of Systems:  Cardiac Review of Systems: Y or N  Chest Pain [  ] Resting SOB [  ] Exertional SOB [  ] Orthopnea [  ]  Pedal Edema y[ ]  Palpitations [   ] Syncope [   ] Presyncope [  ]  General Review of Systems: [ Y ] =  yes [ ] =no  Constitional: recent weight change [ ] ; anorexia [ ] ; fatigue [ ] ; nausea [ ] ; night sweats [ ] ; fever [ ] ; or chills [ ] ;  Dental: poor dentition[ ] ; Last Dentist visit 6 months  Eye : blurred vision [ ] ; diplopia [ ] ; vision changes [ ] ; Amaurosis fugax[ ] ;  Resp: cough [ ] ; wheezing[ ] ; hemoptysis[ ] ; shortness of breath[ ] ; paroxysmal nocturnal dyspnea[ ] ; dyspnea on exertion[ ] ; or orthopnea[ ] ;  GI: gallstones[ ] , vomiting[ ] ; dysphagia[ ] ; melena[ ] ; hematochezia [ ] ; heartburn[ ] ;  Hx of Colonoscopy[ ] ;  GU: kidney stones [ ] ; hematuria[ ] ; dysuria [ ] ; nocturia[ ] ; history of obstruction [ ] ;  Skin: rash, swelling[ ] ;, hair loss[ ] ; peripheral edema[ x]; or itching[ ] ;  Musculosketetal: myalgias[ ] ; joint swelling[ ] ; joint erythema[ ] ;  joint pain[ ] ; back pain[ ] ;  Heme/Lymph: bruising[ ] ; bleeding[ ] ; anemia[ ] ;  Neuro: TIA[ ] ; headaches[ ] ; stroke[ ] ; vertigo[ ] ; seizures[ ] ; paresthesias[ ] ; difficulty walking[ ] ;  Psych:depression[ ] ; anxiety[ ] ;  Endocrine: diabetes[x ]; thyroid dysfunction[ ] ;  Immunizations: Flu [ ] ; Pneumococcal[ ] ;  Other:   Physical Exam: Vital signs BP 127/81 Heart rate 70 Respirations 18 SpO2 96% on room air  General: 59 year old male in no acute distress. Head: normocephalic Neck: No JVD HEENT: Unremarkable Heart: Regular rate rhythm.  Expected soft systolic murmur consistent with the bioprosthetic aortic valve. Chest: Breath sounds are full, equal, and clear to auscultation.  The sternotomy incision is intact and healing with no sign of complication as are the chest tube sites. Abdomen: Obese, soft and non-tender Extremities: All well-perfused with palpable distal pulses.  There is marked edema in both lower extremities from the knees to the ankles.  There are several areas of nonblanching erythema on both anterior lower legs.  There are a few raised blisters and also evidence of previously ruptured blisters with persistent weeping  of clear fluid that has saturated both of his socks.   Neuro: Grossly normal   Diagnostic Studies and Laboratory Results:  DG Chest 2 View Result Date: 10/05/2023 CLINICAL DATA:  Status post aortic valve replacement EXAM: CHEST - 2 VIEW COMPARISON:  09/26/2023 FINDINGS: Cardiomediastinal silhouette and pulmonary vasculature are within normal limits. Prosthetic aortic valve again seen. Small RIGHT pleural effusion is present. Unchanged compression deformities of the T10 and L2 vertebral bodies. IMPRESSION: Unchanged small RIGHT pleural effusion. Electronically Signed   By: Aliene Lloyd M.D.   On: 10/05/2023 12:40     Assessment/Plan   - Persistent bilateral lower extremity edema with open draining wounds in a 58 year old male who is about 2 weeks post bioprosthetic aortic valve replacement for severe aortic stenosis.  I am very concerned about the risk of infection both in the leg wounds and in the bioprosthetic valve.  We  attempted to arrange for home health nursing to assist with wound care but services are not available in the region of [place]  where Mr. Terriquez lives.  Will readmit to the hospital for aggressive diuresis and wound care.  We will obtain a venous duplex scan to to ensure there are no lower extremity venous obstructions.   -Type 2 diabetes mellitus, on insulin : Mr. Munger admits that his blood glucose has been running in the 200-250 range at home.  He will need better control to optimize wound healing.  Will restart his usual medications and also add sliding scale insulin .  Will request diabetes coordinator consult to assist with management.   - Acute on chronic kidney disease: Postop creatinine peaked at 1.77 after recent surgery.  Follow-up BMP has been drawn and results are pending.  Will monitor while in the hospital   - History of hypertension: Well-controlled on current regimen   -Dyslipidemia: On atorvastatin   Laurel JUDITHANN Becket, PA-C 10/05/2023, 2:10 PM

## 2023-10-05 NOTE — Patient Instructions (Signed)
 Jonathon Bray will be readmitted to the hospital this afternoon for wound care, further diuresis, and intensive management of his diabetes mellitus.

## 2023-10-05 NOTE — Consult Note (Signed)
 WOC Nurse Consult Note: Reason for Consult: Bilateral lower extremity edema  Was discharged with Unna boots and to follow up in the office.  Removed Unna boots 2 days ago prior to follow up appointment due to discomfort.  Legs are edematous, weeping and nonintact  Ruptured and intact serum filled blisters  patient is a pending admission and will be admitted later tonight.  Wound type:inflammatory Pressure Injury POA: NA Measurement:To be obtained by bedside RN on admission.  Wound bed: ruptured blisters reported Drainage (amount, consistency, odor) Heavy serous weeping reported Periwound:Edema, erythema and increased tenderness.  Dressing procedure/placement/frequency: Cleanse bilateral legs with soap and water and pat dry. Apply aquacel to weeping areas (Will need 3 sheets for each leg) LAWSON # [id number]) Wrap legs with kerlix from below toes to below knees and secure with self adherent coban.  (IF coban not available, can use ACE wraps)  WOC team to see in AM.   Will change daily until edema improves and dressings will last longer.  Will follow.  Darice Cooley MSN, RN, FNP-BC CWON Wound, Ostomy, Continence Nurse Outpatient [facility] [phone] Pager [phone]

## 2023-10-06 ENCOUNTER — Inpatient Hospital Stay (HOSPITAL_COMMUNITY)

## 2023-10-06 DIAGNOSIS — N179 Acute kidney failure, unspecified: Secondary | ICD-10-CM

## 2023-10-06 DIAGNOSIS — J90 Pleural effusion, not elsewhere classified: Secondary | ICD-10-CM

## 2023-10-06 DIAGNOSIS — S80821A Blister (nonthermal), right lower leg, initial encounter: Secondary | ICD-10-CM

## 2023-10-06 DIAGNOSIS — Z952 Presence of prosthetic heart valve: Secondary | ICD-10-CM | POA: Diagnosis not present

## 2023-10-06 DIAGNOSIS — J9811 Atelectasis: Secondary | ICD-10-CM

## 2023-10-06 DIAGNOSIS — I5023 Acute on chronic systolic (congestive) heart failure: Secondary | ICD-10-CM

## 2023-10-06 DIAGNOSIS — R609 Edema, unspecified: Secondary | ICD-10-CM

## 2023-10-06 DIAGNOSIS — S80822A Blister (nonthermal), left lower leg, initial encounter: Secondary | ICD-10-CM

## 2023-10-06 LAB — ECHOCARDIOGRAM COMPLETE
AR max vel: 2.32 cm2
AV Area VTI: 2.39 cm2
AV Area mean vel: 2.3 cm2
AV Mean grad: 8 mmHg
AV Peak grad: 13.8 mmHg
Ao pk vel: 1.86 m/s
Area-P 1/2: 3.4 cm2
Height: 65 in
S' Lateral: 3 cm
Weight: 3265.6 [oz_av]

## 2023-10-06 LAB — GLUCOSE, CAPILLARY
Glucose-Capillary: 324 mg/dL — ABNORMAL HIGH (ref 70–99)
Glucose-Capillary: 246 mg/dL — ABNORMAL HIGH (ref 70–99)
Glucose-Capillary: 59 mg/dL — ABNORMAL LOW (ref 70–99)
Glucose-Capillary: 85 mg/dL (ref 70–99)

## 2023-10-06 LAB — CBC
HCT: 35.1 % — ABNORMAL LOW (ref 39.0–52.0)
Hemoglobin: 11.6 g/dL — ABNORMAL LOW (ref 13.0–17.0)
MCH: 28.3 pg (ref 26.0–34.0)
MCHC: 33 g/dL (ref 30.0–36.0)
MCV: 85.6 fL (ref 80.0–100.0)
Platelets: 411 10*3/uL — ABNORMAL HIGH (ref 150–400)
RBC: 4.1 MIL/uL — ABNORMAL LOW (ref 4.22–5.81)
RDW: 12.6 % (ref 11.5–15.5)
WBC: 12.7 10*3/uL — ABNORMAL HIGH (ref 4.0–10.5)
nRBC: 0 % (ref 0.0–0.2)

## 2023-10-06 LAB — BASIC METABOLIC PANEL WITH GFR
Anion gap: 11 (ref 5–15)
BUN/Creatinine Ratio: 31 — ABNORMAL HIGH (ref 9–20)
BUN: 38 mg/dL — ABNORMAL HIGH (ref 6–24)
BUN: 43 mg/dL — ABNORMAL HIGH (ref 6–20)
CO2: 31 mmol/L — ABNORMAL HIGH (ref 20–29)
CO2: 32 mmol/L (ref 22–32)
Calcium: 9.5 mg/dL (ref 8.7–10.2)
Calcium: 8.9 mg/dL (ref 8.9–10.3)
Chloride: 92 mmol/L — ABNORMAL LOW (ref 96–106)
Chloride: 94 mmol/L — ABNORMAL LOW (ref 98–111)
Creatinine, Ser: 1.23 mg/dL (ref 0.76–1.27)
Creatinine, Ser: 1.38 mg/dL — ABNORMAL HIGH (ref 0.61–1.24)
GFR, Estimated: 60 mL/min — ABNORMAL LOW (ref >60)
Glucose, Bld: 155 mg/dL — ABNORMAL HIGH (ref 70–99)
Glucose: 206 mg/dL — ABNORMAL HIGH (ref 70–99)
Potassium: 4.7 mmol/L (ref 3.5–5.2)
Potassium: 3.7 mmol/L (ref 3.5–5.1)
Sodium: 138 mmol/L (ref 134–144)
Sodium: 137 mmol/L (ref 135–145)
eGFR: 68 mL/min/{1.73_m2} (ref >59)

## 2023-10-06 LAB — MAGNESIUM: Magnesium: 1.8 mg/dL (ref 1.7–2.4)

## 2023-10-06 MED ORDER — INSULIN ASPART 100 UNIT/ML IJ SOLN
0.0000 [IU] | Freq: Every day | INTRAMUSCULAR | Status: DC
  Administered 2023-10-09 – 2023-10-12 (×2): 2 [IU] via SUBCUTANEOUS

## 2023-10-06 MED ORDER — INSULIN ASPART 100 UNIT/ML IJ SOLN
0.0000 [IU] | Freq: Three times a day (TID) | INTRAMUSCULAR | Status: DC
  Administered 2023-10-07: 1 [IU] via SUBCUTANEOUS
  Administered 2023-10-07: 2 [IU] via SUBCUTANEOUS
  Administered 2023-10-08: 1 [IU] via SUBCUTANEOUS
  Administered 2023-10-08: 2 [IU] via SUBCUTANEOUS
  Administered 2023-10-09: 3 [IU] via SUBCUTANEOUS
  Administered 2023-10-09: 1 [IU] via SUBCUTANEOUS
  Administered 2023-10-10: 2 [IU] via SUBCUTANEOUS
  Administered 2023-10-10: 5 [IU] via SUBCUTANEOUS
  Administered 2023-10-11 – 2023-10-14 (×9): 2 [IU] via SUBCUTANEOUS
  Administered 2023-10-14: 3 [IU] via SUBCUTANEOUS

## 2023-10-06 MED ORDER — SPIRONOLACTONE 25 MG PO TABS
25.0000 mg | ORAL_TABLET | Freq: Every day | ORAL | Status: DC
  Administered 2023-10-06 – 2023-10-15 (×10): 25 mg via ORAL
  Filled 2023-10-06 (×10): qty 1

## 2023-10-06 MED ORDER — FUROSEMIDE 10 MG/ML IJ SOLN
80.0000 mg | Freq: Two times a day (BID) | INTRAMUSCULAR | Status: DC
  Administered 2023-10-06: 80 mg via INTRAVENOUS
  Filled 2023-10-06: qty 8

## 2023-10-06 MED ORDER — ONDANSETRON HCL 4 MG/2ML IJ SOLN: 4.0000 mg | Freq: Three times a day (TID) | INTRAMUSCULAR | Status: DC | PRN

## 2023-10-06 MED ORDER — MAGNESIUM SULFATE 2 GM/50ML IV SOLN
2.0000 g | Freq: Once | INTRAVENOUS | Status: AC
  Administered 2023-10-06: 2 g via INTRAVENOUS
  Filled 2023-10-06: qty 50

## 2023-10-06 MED ORDER — INSULIN GLARGINE-YFGN 100 UNIT/ML ~~LOC~~ SOLN
24.0000 [IU] | Freq: Two times a day (BID) | SUBCUTANEOUS | Status: DC
  Administered 2023-10-06 – 2023-10-15 (×18): 24 [IU] via SUBCUTANEOUS
  Filled 2023-10-06 (×20): qty 0.24

## 2023-10-06 MED ORDER — POTASSIUM CHLORIDE CRYS ER 20 MEQ PO TBCR
60.0000 meq | EXTENDED_RELEASE_TABLET | Freq: Once | ORAL | Status: AC
  Administered 2023-10-06: 60 meq via ORAL
  Filled 2023-10-06: qty 3

## 2023-10-06 MED ORDER — INSULIN ASPART 100 UNIT/ML IJ SOLN
18.0000 [IU] | Freq: Three times a day (TID) | INTRAMUSCULAR | Status: DC
  Administered 2023-10-06 – 2023-10-07 (×4): 18 [IU] via SUBCUTANEOUS

## 2023-10-06 MED ORDER — PERFLUTREN LIPID MICROSPHERE
1.0000 mL | INTRAVENOUS | Status: AC | PRN
  Administered 2023-10-06: 2 mL via INTRAVENOUS

## 2023-10-06 MED ORDER — ASPIRIN 325 MG PO TBEC
325.0000 mg | DELAYED_RELEASE_TABLET | Freq: Every day | ORAL | Status: DC
  Administered 2023-10-07 – 2023-10-12 (×6): 325 mg via ORAL
  Filled 2023-10-06 (×7): qty 1

## 2023-10-06 NOTE — Progress Notes (Signed)
      [address] Zone [place] [address]             [phone]      Patient not tolerating unna boots at this time due to ruptured blisters on bilateral shins. Wound care nurse has signed off, orders have been placed. Have ordered SCDs to be placed if patient tolerates. Spoke with the RN and recommended lower extremity elevation as much as possible when in the bed or chair. Vascular duplex without DVT.   Patient's CBGs 324/246/59/85. He was hyperglycemic until he received 15U mealtime Novolog  and 11U sliding scale Novolog  with breakfast and became hypoglycemic CBG 59. Adjusted Semglee  to 24U BID and Novolog  to 18U TID and adjusted SSI to thin/renal scale. Diabetes coordinator following.   AHF team decreased ASA to 81mg  daily, as discussed with Dr. Lucas recommend continuing ASA 325mg  for 3 months postoperatively and then can decrease to ASA 81mg . Will change ASA to 325mg  daily.   Con GORMAN Bend, PA-C  10/06/23

## 2023-10-06 NOTE — Evaluation (Signed)
 Occupational Therapy Evaluation Patient Details Name: Jonathon Bray MRN: [id number] DOB: Oct 28, 1965 Today's Date: 10/06/2023   History of Present Illness   Pt is a 58 y/o male with severe AS and HF presenting 7/2 for evaluation of lower extremity swelling, hypotension, presyncope, DOE and orthopnea.  Pt s/p AVR 6/19 via median sternotomy.   PMHx:  DM, HTN  AS, kyphoplasty.     Clinical Impressions Pt was independent prior to admission, driving and working. He lives with his supportive wife who is also the caregiver of her mother.   Recent hospitalization, was supposed to be getting [facility] nursing and rehab, but had not started.  Patient is presenting with deficits listed below, needing generalized supervision for mobility at RW level, and Min A for ADL completion.  OT will continue efforts in the acute setting, and HH OT is recommended.       If plan is discharge home, recommend the following:   A little help with walking and/or transfers;A lot of help with bathing/dressing/bathroom;Assistance with cooking/housework;Assist for transportation;Help with stairs or ramp for entrance     Functional Status Assessment   Patient has had a recent decline in their functional status and demonstrates the ability to make significant improvements in function in a reasonable and predictable amount of time.     Equipment Recommendations   Tub/shower seat     Recommendations for Other Services         Precautions/Restrictions   Precautions Precautions: Fall;Sternal Recall of Precautions/Restrictions: Intact Restrictions Weight Bearing Restrictions Per Provider Order: No Other Position/Activity Restrictions: sternal precs     Mobility Bed Mobility Overal bed mobility: Modified Independent                  Transfers Overall transfer level: Needs assistance   Transfers: Sit to/from Stand Sit to Stand: Supervision                  Balance Overall balance  assessment: Needs assistance Sitting-balance support: Feet supported Sitting balance-Leahy Scale: Good     Standing balance support: Reliant on assistive device for balance Standing balance-Leahy Scale: Fair                             ADL either performed or assessed with clinical judgement   ADL       Grooming: Supervision/safety;Standing               Lower Body Dressing: Minimal assistance;Sit to/from stand   Toilet Transfer: Supervision/safety;Ambulation                   Vision Patient Visual Report: No change from baseline       Perception Perception: Not tested       Praxis Praxis: Not tested       Pertinent Vitals/Pain Pain Assessment Pain Assessment: Faces Faces Pain Scale: No hurt Pain Intervention(s): Monitored during session     Extremity/Trunk Assessment Upper Extremity Assessment Upper Extremity Assessment: Overall WFL for tasks assessed   Lower Extremity Assessment Lower Extremity Assessment: Defer to PT evaluation   Cervical / Trunk Assessment Cervical / Trunk Assessment: Normal   Communication Communication Communication: No apparent difficulties   Cognition Arousal: Alert Behavior During Therapy: WFL for tasks assessed/performed Cognition: No apparent impairments  Following commands: Intact       Cueing  General Comments   Cueing Techniques: Verbal cues   VSS on RA   Exercises     Shoulder Instructions      Home Living Family/patient expects to be discharged to:: Private residence Living Arrangements: Spouse/significant other Available Help at Discharge: Family;Available 24 hours/day Type of Home: House Home Access: Ramped entrance     Home Layout: One level     Bathroom Shower/Tub: Producer, television/film/video: Standard Bathroom Accessibility: Yes How Accessible: Accessible via walker Home Equipment: Rolling Walker (2 wheels);Cane - single  point;Hand held shower head          Prior Functioning/Environment Prior Level of Function : Independent/Modified Independent;Driving;Working/employed                    OT Problem List: Decreased activity tolerance;Impaired balance (sitting and/or standing)   OT Treatment/Interventions: Self-care/ADL training;Therapeutic activities;Balance training      OT Goals(Current goals can be found in the care plan section)   Acute Rehab OT Goals Patient Stated Goal: Return home OT Goal Formulation: With patient Time For Goal Achievement: 10/20/23 Potential to Achieve Goals: Good ADL Goals Pt Will Perform Grooming: with modified independence;standing Pt Will Perform Lower Body Dressing: with modified independence;sit to/from stand Pt Will Transfer to Toilet: with modified independence;regular height toilet;ambulating   OT Frequency:  Min 2X/week    Co-evaluation              AM-PAC OT 6 Clicks Daily Activity     Outcome Measure Help from another person eating meals?: None Help from another person taking care of personal grooming?: A Little Help from another person toileting, which includes using toliet, bedpan, or urinal?: A Little Help from another person bathing (including washing, rinsing, drying)?: A Little Help from another person to put on and taking off regular upper body clothing?: None Help from another person to put on and taking off regular lower body clothing?: A Little 6 Click Score: 20   End of Session Equipment Utilized During Treatment: Rolling walker (2 wheels)  Activity Tolerance: Patient tolerated treatment well Patient left: in chair;with call bell/phone within reach  OT Visit Diagnosis: Unsteadiness on feet (R26.81);Pain;Muscle weakness (generalized) (M62.81);Other symptoms and signs involving cognitive function                Time: 0945-1005 OT Time Calculation (min): 20 min Charges:  OT General Charges $OT Visit: 1 Visit OT  Evaluation $OT Eval Moderate Complexity: 1 Mod  10/06/2023  RP, OTR/L  Acute Rehabilitation Services  Office:  [phone]   Jonathon Bray 10/06/2023, 10:12 AM

## 2023-10-06 NOTE — Progress Notes (Signed)
 Advanced Heart Failure Rounding Note  Cardiologist: Madonna Large, DO  Chief Complaint: Heart Failure  Subjective:   7/2: Diuresed with IV lasix .   Brisk diuresis noted.    Feeling a little better. Denies SOB.    Objective:   Weight Range: 92.6 kg Body mass index is 33.96 kg/m.   Vital Signs:   Temp:  [97.2 F (36.2 C)-98.3 F (36.8 C)] 97.7 F (36.5 C) (07/03 0750) Pulse Rate:  [77-88] 80 (07/03 0750) Resp:  [17-20] 19 (07/03 0750) BP: (116-136)/(66-78) 136/78 (07/03 0750) SpO2:  [96 %-99 %] 97 % (07/03 0750) Weight:  [91.6 kg-92.6 kg] 92.6 kg (07/03 0348) Last BM Date : 10/05/23  Weight change: Filed Weights   10/05/23 2204 10/06/23 0348  Weight: 91.6 kg 92.6 kg    Intake/Output:   Intake/Output Summary (Last 24 hours) at 10/06/2023 0806 Last data filed at 10/06/2023 0801 Gross per 24 hour  Intake 480 ml  Output 2100 ml  Net -1620 ml      Physical Exam    General:   No resp difficulty Neck: supple. JVP 9-10  Cor: PMI nondisplaced. Regular rate & rhythm. No rubs, gallops or murmurs. Lungs: clear Abdomen: soft, nontender, nondistended.  Extremities: no cyanosis, clubbing, rash, R and LLE 1+ edema with dressing in place.  Neuro: alert & oriented x3   Telemetry   SR 80s   EKG    N/A  Labs    CBC Recent Labs    10/05/23 2003 10/06/23 0320  WBC 15.1* 12.7*  HGB 12.4* 11.6*  HCT 37.2* 35.1*  MCV 85.5 85.6  PLT 424* 411*   Basic Metabolic Panel Recent Labs    92/97/74 2003 10/06/23 0320  NA 138 137  K 4.2 3.7  CL 90* 94*  CO2 33* 32  GLUCOSE 232* 155*  BUN 41* 43*  CREATININE 1.60* 1.38*  CALCIUM  9.1 8.9  MG  --  1.8   Liver Function Tests Recent Labs    10/05/23 2003  AST 19  ALT 12  ALKPHOS 66  BILITOT 0.6  PROT 7.0  ALBUMIN  2.7*   No results for input(s): LIPASE, AMYLASE in the last 72 hours. Cardiac Enzymes No results for input(s): CKTOTAL, CKMB, CKMBINDEX, TROPONINI in the last 72  hours.  BNP: BNP (last 3 results) Recent Labs    09/10/23 2142 09/16/23 1722 10/05/23 2003  BNP 440.4* 376.4* 459.1*    ProBNP (last 3 results) Recent Labs    06/06/23 1633 06/28/23 1402 07/27/23 1314  PROBNP 1,093* 1,172* 1,063*     D-Dimer No results for input(s): DDIMER in the last 72 hours. Hemoglobin A1C No results for input(s): HGBA1C in the last 72 hours. Fasting Lipid Panel No results for input(s): CHOL, HDL, LDLCALC, TRIG, CHOLHDL, LDLDIRECT in the last 72 hours. Thyroid Function Tests No results for input(s): TSH, T4TOTAL, T3FREE, THYROIDAB in the last 72 hours.  Invalid input(s): FREET3  Other results:   Imaging    DG CHEST PORT 1 VIEW Result Date: 10/05/2023 CLINICAL DATA:  [id number] Heart failure (HCC) [id number]. EXAM: PORTABLE CHEST 1 VIEW COMPARISON:  Radiograph from earlier the same day, 12:07 p.m. FINDINGS: Low lung volume. There is small right pleural effusion, unchanged. Left lateral costophrenic angle is clear. Bilateral lung fields are clear. Stable cardio-mediastinal silhouette. Sternotomy wires and prosthetic aortic valve again seen. No acute osseous abnormalities. The soft tissues are within normal limits. IMPRESSION: *Stable exam. Small right pleural effusion. Electronically Signed   By: Ree Molt  M.D.   On: 10/05/2023 16:56   DG Chest 2 View Result Date: 10/05/2023 CLINICAL DATA:  Status post aortic valve replacement EXAM: CHEST - 2 VIEW COMPARISON:  09/26/2023 FINDINGS: Cardiomediastinal silhouette and pulmonary vasculature are within normal limits. Prosthetic aortic valve again seen. Small RIGHT pleural effusion is present. Unchanged compression deformities of the T10 and L2 vertebral bodies. IMPRESSION: Unchanged small RIGHT pleural effusion. Electronically Signed   By: Aliene Lloyd M.D.   On: 10/05/2023 12:40     Medications:     Scheduled Medications:  aspirin  EC  81 mg Oral Daily   atorvastatin   40 mg Oral QHS    docusate sodium   100 mg Oral BID   enoxaparin  (LOVENOX ) injection  40 mg Subcutaneous Q24H   furosemide   80 mg Intravenous BID   gabapentin   100 mg Oral BID   insulin  aspart  0-15 Units Subcutaneous TID WC   insulin  aspart  0-5 Units Subcutaneous QHS   insulin  aspart  15 Units Subcutaneous TID WC   insulin  glargine-yfgn  20 Units Subcutaneous BID   metoprolol  succinate  25 mg Oral Daily   potassium chloride   60 mEq Oral Once   sodium chloride  flush  3 mL Intravenous Q12H    Infusions:   PRN Medications: acetaminophen  **OR** acetaminophen , oxyCODONE     Patient Profile   58 y.o. male with history of HFrEF, HTN, T2DM, CKD 2, and aortic stenosis s/p bioprothestic AVR with Dr. Kerrin on 09/22/23.   Assessment/Plan   1. Acute on chronic systolic CHF: Nonischemic cardiomyopathy.  ?Valvular related to severe AS versus LBBB cardiomyopathy.  Cath in pre-AVR showed no significant CAD but markedly elevated filling pressures.  Echo in 6/25 (pre-op) showed EF 20-25% with septal-lateral dyssynchrony, normal RV function, severe aortic stenosis.  Patient is now s/p bioprosthetic AVR.  Intra-op TEE reported EF 50% but no post-op echo was done.  Repeat ECHO ordered.  - Volume status improving. Continue  Lasix  80 mg IV bid. PTA he was taking torsemide  20 mg twice a day.  - Add spiro 25 mg daily  - On low dose Toprol  XL 25 mg daily, can continue for now.  - Consider Entresto tomorrow.  - Allergy to  jardiance and invokana- syncope/dizziness.  - Renal function stable.  - Would benefit from CRT-D give LBBB if EF remains low a few months after AVR and medical optimization.  2. Aortic stenosis: S/p bioprosthetic AVR in 6/25.  - Echo to reassess valve.  3. CKD stage 3: Creatinine baseline ~ 1.5-1.7 Stable.  4. Venous stasis ulcers: R and LLE wounds. WOC consulted.    Length of Stay: 1  Kaleo Condrey, NP  10/06/2023, 8:06 AM  Advanced Heart Failure Team Pager [phone] (M-F; 7a - 5p)  Please  contact [facility] for night-coverage after hours (5p -7a ) and weekends on [email or web address]

## 2023-10-06 NOTE — Inpatient Diabetes Management (Addendum)
 Inpatient Diabetes Program Recommendations  AACE/ADA: New Consensus Statement on Inpatient Glycemic Control (2015)  Target Ranges:  Prepandial:   less than 140 mg/dL      Peak postprandial:   less than 180 mg/dL (1-2 hours)      Critically ill patients:  140 - 180 mg/dL   Lab Results  Component Value Date   GLUCAP 85 10/06/2023   HGBA1C 8.3 (H) 09/22/2023    Review of Glycemic Control  Diabetes history: DM2 Outpatient Diabetes medications: Tresiba 54 units daily, Humalog  16 units TID, Ozempic 0.25 mg weekly  Current orders for Inpatient glycemic control: Semglee  20 units BID, Novolog  0-15 units correction scale TID, Novolog  0-5 units HS scale, Novolog  15 units TID  Inpatient Diabetes Program Recommendations:   Spoke with patient at the bedside. Patient was diagnosed about 20 years ago. He does have an endocrinologist. He has been on Ozempic for about 1 month now, but has lost 85-90 pounds over the last year. He has been very conscious of what he is eating at home. Patient wears a Libre 3+ CGM at home. Patient states that his blood sugars have been up and down at home.   Discussed with patient that he needs to keep his blood sugars under control so that he can heal. Noted that HgbA1C is 8.3%.   Will continue to monitor blood sugars while in the hospital.  Marjorie Lunger RN BSN CDE Diabetes Coordinator Pager: [phone]  8am-5pm

## 2023-10-06 NOTE — Progress Notes (Addendum)
 [address] Zone [place] [address]             [phone]         Subjective: Patient reports he peed a lot last night and his legs feel better today  Objective: Vital signs in last 24 hours: Temp:  [97.2 F (36.2 C)-98.3 F (36.8 C)] 97.2 F (36.2 C) (07/03 0348) Pulse Rate:  [70-88] 77 (07/03 0348) Cardiac Rhythm: Normal sinus rhythm (07/02 1909) Resp:  [17-20] 18 (07/03 0348) BP: (116-130)/(66-81) 117/66 (07/03 0348) SpO2:  [96 %-99 %] 98 % (07/03 0348) Weight:  [91.6 kg-92.6 kg] 92.6 kg (07/03 0348)  Hemodynamic parameters for last 24 hours:    Intake/Output from previous day: 07/02 0701 - 07/03 0700 In: 240 [P.O.:240] Out: 1900 [Urine:1900] Intake/Output this shift: No intake/output data recorded.  General appearance: alert, cooperative, and no distress Neurologic: intact Heart: regular rate and rhythm, expected soft systolic murmur Lungs: slightly diminished bibasilar breath sounds Abdomen: soft, non-tender; bowel sounds normal; no masses,  no organomegaly Extremities: severe bilateral peripheral edema with blisters, dressings in place over bilateral shins  Wound: dressings in place over bilateral lower legs this AM, blisters on bilateral lower legs  Lab Results: Recent Labs    10/05/23 2003 10/06/23 0320  WBC 15.1* 12.7*  HGB 12.4* 11.6*  HCT 37.2* 35.1*  PLT 424* 411*   BMET:  Recent Labs    10/05/23 2003 10/06/23 0320  NA 138 137  K 4.2 3.7  CL 90* 94*  CO2 33* 32  GLUCOSE 232* 155*  BUN 41* 43*  CREATININE 1.60* 1.38*  CALCIUM  9.1 8.9    PT/INR: No results for input(s): LABPROT, INR in the last 72 hours. ABG    Component Value Date/Time   PHART 7.260 (L) 09/22/2023 1758   HCO3 20.2 09/22/2023 1758   TCO2 22 09/22/2023 1758   ACIDBASEDEF 7.0 (H) 09/22/2023 1758   O2SAT 91 09/22/2023 1758   CBG (last 3)  Recent Labs    10/05/23 1700 10/05/23 2137  GLUCAP 237* 228*    Assessment/Plan: S/P    CV: S/P AVR 06/19, HFrEF LVEF 20-25% prior to surgery. AHF has been consulted, have ordered repeat echo. On Toprol  XL 25mg  daily and IV Lasix  40mg  BID. Per AHF note will start IV Lasix  80mg  BID. Titrate GDMT as able, AHF to possibly add spironolactone. NSR, HR 80s, LBBB. BP 117/66.   Pulm: CXR with bibasilar atelectasis and small right pleural effusion. Encourage IS and ambulation.   GI: +BM yesterday  Endo: Uncontrolled T2DM. Average blood sugar is in the 200s at home. Patient report he has an endocrinologist he follows up with.  CBGs 237/228 yesterday upon arrival. Continue Semglee  20U BID and Novolog  15U TID and SSI. No CBGs yet this AM. Diabetes coordinator has been consulted.   Renal: AKI on CKD stage II, baseline creatinine 1.2-1.3. Cr 1.38 this AM. UO 1900cc/24hrs. +3 lbs from yesterday  ID: Leukocytosis 12.7, trending down from 15.1 yesterday.  Tmax 98.3  Bilateral leg wounds: Peripheral edema with weeping blisters, wound care has been consulted and have placed orders. Unna boots placed yesterday but patient only tolerated for about 3 hours. Wound care to see patient this AM and replace unna boots. Venous duplex ordered, not done yet. No sign of infection or cellulitis at this time. Low threshold to start antibiotics to avoid infection of legs and aortic valve. Continue aggressive diuresis.   Deconditioning:  Will order PT/OT, patient will not be able to get [facility] PT/OT to his home in [place] .   DVT Prophylaxis: Lovenox   Dispo: Patient needs aggressive diuresis and GDMT titration    LOS: 1 day    Con GORMAN Bend, PA-C 10/06/2023   Chart reviewed, patient examined, agree with above.  He needs to stay in the hospital for continued diuresis, tuning up his heart failure and wound care. Echo done today and pending.

## 2023-10-06 NOTE — Plan of Care (Signed)

## 2023-10-06 NOTE — TOC Initial Note (Signed)
 Transition of Care [facility]) - Initial/Assessment Note    Patient Details  Name: Jonathon Bray MRN: [id number] Date of Birth: [birth year]  Transition of Care [facility] CM/SW Contact:    Jonathon Bray Phone Number: [phone] 10/06/2023, 12:28 PM  Clinical Narrative:  11:36 AM- HF CSW attempted to meet with patient at bedside. Patient was being seen in the room by nursing staff. CSW will follow up at a more appropriate time.   HF CSW met with patient at bedside. Patient stated that he lives with his wife and mother-in-law ([place]). Patient stated that he has no history of HH services. Patient stated that he uses a walker. Patient stated that he still works full-time, and will need work note at [employer]. Patient stated that he does not drive. Patient stated that he has a PCP. CSW explained that a hospital follow up appointment will be scheduled closer towards dc. Patient agrees.   TOC will continue following.                        Patient Goals and CMS Choice            Expected Discharge Plan and Services                                              Prior Living Arrangements/Services                       Activities of Daily Living      Permission Sought/Granted                  Emotional Assessment              Admission diagnosis:  S/P AVR (aortic valve replacement) [Z95.2] 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   Nonrheumatic aortic valve stenosis 05/11/2022   Family history of premature CAD 05/11/2022   Atherosclerosis of aorta (HCC) 05/11/2022   Aortic dilatation (HCC) 05/11/2022   Hyperlipidemia associated with type 2 diabetes mellitus (HCC) 05/11/2022   Benign hypertension 05/11/2022    Non-ST elevation (NSTEMI) myocardial infarction (HCC) 05/10/2022   Hypercholesteremia 05/10/2022   Type 2 diabetes mellitus with complication, with long-term current use of insulin  (HCC) 05/10/2022   NSTEMI (non-ST elevated myocardial infarction) (HCC) 05/10/2022   Abnormal cardiac enzyme level 05/10/2022   Spondylolysis, lumbar region 01/01/2021   Solitary pulmonary nodule on lung CT 04/02/2020   Orthostatic dizziness 01/24/2014   Primary hypertension 01/24/2014   Diabetes mellitus (HCC) 06/01/2012   PCP:  Jonathon Leta NOVAK, MD Pharmacy:   [facility] [address] [place] - [address] [place] [address] Phone: [phone] Fax: [phone]  [facility] [address] [place] [address] Phone: [phone] Fax: [phone]     Social Drivers of Health (SDOH) Social History: SDOH Screenings   Food Insecurity: No Food Insecurity (10/05/2023)  Housing: Low Risk  (10/05/2023)  Transportation Needs: No Transportation Needs (10/05/2023)  Utilities: Not At Risk (10/05/2023)  Social Connections: Unknown (08/17/2021)   Received from [facility]  Tobacco  Use: Low Risk  (10/05/2023)   SDOH Interventions:     Readmission Risk Interventions    09/28/2023    2:07 PM  Readmission Risk Prevention Plan  Transportation Screening Complete  PCP or Specialist Appt within 5-7 Days Complete  Home Care Screening Complete  Medication Review (RN CM) Complete

## 2023-10-06 NOTE — Discharge Instructions (Addendum)
 Discharge Instructions:  1. You may shower, please wash incisions daily with soap and water and keep dry.  If you wish to cover wounds with dressing you may do so but please keep clean and change daily.  No tub baths or swimming until incisions have completely healed.  If your incisions become red or develop any drainage please call our office at [phone]  2. No Driving until cleared by Dr. Chrystal office and you are no longer using narcotic pain medications  3. Monitor your weight daily.. Please use the same scale and weigh at same time... If you gain 5-10 lbs in 48 hours with associated lower extremity swelling, please contact our office at [phone]  4. Fever of 101.5 for at least 24 hours with no source, please contact our office at [phone]  5. Activity- up as tolerated, please walk at least 3 times per day.  Avoid strenuous activity, no lifting, pushing, or pulling with your arms over 8-10 lbs for a minimum of 6 weeks  6. If any questions or concerns arise, please do not hesitate to contact our office at [phone] ____________________________________ Information on my medicine - ELIQUIS  (apixaban )  Why was Eliquis  prescribed for you? Eliquis  was prescribed for you to reduce the risk of forming blood clots that can cause a stroke if you have a medical condition called atrial fibrillation (a type of irregular heartbeat) OR to reduce the risk of a blood clots forming after orthopedic surgery.  What do You need to know about Eliquis  ? Take your Eliquis  TWICE DAILY - one tablet in the morning and one tablet in the evening with or without food.  It would be best to take the doses about the same time each day.  If you have difficulty swallowing the tablet whole please discuss with your pharmacist how to take the medication safely.  Take Eliquis  exactly as prescribed by your doctor and DO NOT stop taking Eliquis  without talking to the doctor who prescribed the  medication.  Stopping may increase your risk of developing a new clot or stroke.  Refill your prescription before you run out.  After discharge, you should have regular check-up appointments with your healthcare provider that is prescribing your Eliquis .  In the future your dose may need to be changed if your kidney function or weight changes by a significant amount or as you get older.  What do you do if you miss a dose? If you miss a dose, take it as soon as you remember on the same day and resume taking twice daily.  Do not take more than one dose of ELIQUIS  at the same time.  Important Safety Information A possible side effect of Eliquis  is bleeding. You should call your healthcare provider right away if you experience any of the following: Bleeding from an injury or your nose that does not stop. Unusual colored urine (red or dark brown) or unusual colored stools (red or black). Unusual bruising for unknown reasons. A serious fall or if you hit your head (even if there is no bleeding).  Some medicines may interact with Eliquis  and might increase your risk of bleeding or clotting while on Eliquis . To help avoid this, consult your healthcare provider or pharmacist prior to using any new prescription or non-prescription medications, including herbals, vitamins, non-steroidal anti-inflammatory drugs (NSAIDs) and supplements.  This website has more information on Eliquis  (apixaban ): [email or web address] .com/eliquis /home  Wound care:  Continue to wash both leg wounds with Dial  soap and water daily. You may  leave the right leg wound open to air.  Apply Telfa non-adherent dressing to the left leg wound then a dry gauze. You may leave the left leg wound open to air when it is dry (no longer oozing).

## 2023-10-06 NOTE — Progress Notes (Signed)
 Heart Failure Navigator Progress Note  Assessed for Heart & Vascular TOC clinic readiness.  Patient does not meet criteria due to Advanced Heart Failure Team patient of Dr. Shirlee Latch.   Navigator will sign off at this time.    Rhae Hammock, BSN, Scientist, clinical (histocompatibility and immunogenetics) Only

## 2023-10-06 NOTE — Hospital Course (Addendum)
 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 Kerrin earlier this year for management of severe aortic stenosis.  He ultimately had bioprosthetic aortic valve replacement on 09/22/2023 by Dr. Kerrin with a 23 mm Edwards Inspiris Resilia pericardial tissue valve.  The postoperative course was notable for silent aspiration early after surgery with improvement of his swallowing function by the time of his discharge.  He also had acute on chronic renal insufficiency postoperatively with a peak creatinine of 1.73.  He was diuresed aggressively postoperatively for significant volume excess with torsemide .  He was discharged on torsemide  with Unna boots in place to mitigate the lower extremity edema.  We attempted to arrange home health nursing along with home physical and occupational therapies but we could not get an agency to agree to come to his home located in [place] .  He was discharged home on 09/28/2023 and asked to return today for a postoperative checkup with Bmet and to evaluate whether or not he needs to continue use of the Unna boots.   [name] presented to the office today with his wife.  Overall, he feels like he is making progress since his discharge to home.  He has been taking medications as prescribed and notes that he is lost a lot of water weight.  He had no difficulty swallowing since his discharge.  From an activity standpoint, he is primarily just been walking around inside the house.  He has not had any significant shortness of breath and the parasternal pain has been well-controlled.  He did develop a lot of discomfort related to the Unna boots and he took them off himself 2 days ago.  Since then, he has had persistent watery drainage from blisters on the anterior surface of both  shins.  Dr. Lucas determined the patient would benefit from readmission. The patient was admitted to [facility] on 10/06/23.  Hospital Course: Upon admission the advanced heart failure team was consulted as well as wound care. Unna boots were replaced but they were removed about 3 hours later due to discomfort. His Toprol  XL 25mg  was continued and he was started on aggressive IV Lasix  80mg  BID per AHF team. Spironolactone  25mg  daily was also added. Echocardiogram was ordered and showed ***. Venous duplex studies on bilateral legs were ordered and showed no sign of DVT. He remained hyperglycemic, Semglee  and novolog  were increased, diabetes coordinator was consulted. He has a history of CKD stage 3, creatinine remained stable.  He has done well in terms of his diuresis significant improvement of lower extremity edema.  He did have significant blistering of some areas and dressing changes have been initiated per wound care recommendations.  He was felt to have early cellulitis and was started on a course of intravenous Ancef .  Plan is to complete a course of 7 days with possible transition to oral antibiotics depending on clinical status.

## 2023-10-06 NOTE — Evaluation (Signed)
 Physical Therapy Evaluation/ discharge Patient Details Name: Jonathon Bray MRN: [id number] DOB: 1966/03/04 Today's Date: 10/06/2023  History of Present Illness  58 y/o male adm 10/05/23 with LB edema and venous stasis ulcers. PMHx: 6/19 AVR with sternotomy and D/C 6/25. CHF, CKD, obesity, T2DM, HTN, kyphoplasty  Clinical Impression  Pt pleasant and reports using RW and cane at home since AVR to maintain balance. Pt able to walk long hall distance and perform transfers without assist. Education provided for transfers, gait progression and repeated sit to stands to maximize strength and endurance. Discussed walking program and strengthening activities with pt and spouse who state understanding. No further acute therapy needs at this time with walking encouraged.      If plan is discharge home, recommend the following: Assist for transportation;Assistance with cooking/housework;A little help with bathing/dressing/bathroom   Can travel by private vehicle        Equipment Recommendations None recommended by PT  Recommendations for Other Services       Functional Status Assessment Patient has not had a recent decline in their functional status     Precautions / Restrictions Precautions Precautions: Fall;Sternal Recall of Precautions/Restrictions: Intact Precaution/Restrictions Comments: pt able to state 3/4 precautions, educated for all      Mobility  Bed Mobility Overal bed mobility: Needs Assistance Bed Mobility: Rolling, Sit to Sidelying, Sidelying to Sit Rolling: Supervision Sidelying to sit: Supervision     Sit to sidelying: Supervision General bed mobility comments: supervision for cues to maintain precautions with tranfers with pt able to demonstrate after cueing    Transfers Overall transfer level: Modified independent   Transfers: Sit to/from Stand             General transfer comment: pt able to stand from toilet, bed and recliner without assist. performed 5  repeated sit to stands from chair without UB support    Ambulation/Gait Ambulation/Gait assistance: Modified independent (Device/Increase time) Gait Distance (Feet): 500 Feet Assistive device: Rolling walker (2 wheels) Gait Pattern/deviations: Step-through pattern   Gait velocity interpretation: >2.62 ft/sec, indicative of community ambulatory   General Gait Details: cues x 1 to step closer to RW otherwise supervision for IV pole only  Stairs            Wheelchair Mobility     Tilt Bed    Modified Rankin (Stroke Patients Only)       Balance Overall balance assessment: Mild deficits observed, not formally tested                                           Pertinent Vitals/Pain Pain Assessment Pain Assessment: No/denies pain    Home Living Family/patient expects to be discharged to:: Private residence Living Arrangements: Spouse/significant other Available Help at Discharge: Family;Available 24 hours/day Type of Home: House Home Access: Ramped entrance       Home Layout: One level Home Equipment: Agricultural consultant (2 wheels);Cane - single point;Hand held shower head      Prior Function Prior Level of Function : Independent/Modified Independent;Driving;Working/employed                     Extremity/Trunk Assessment   Upper Extremity Assessment Upper Extremity Assessment: Overall WFL for tasks assessed    Lower Extremity Assessment Lower Extremity Assessment: Overall WFL for tasks assessed    Cervical / Trunk Assessment Cervical / Trunk Assessment: Normal  Communication   Communication Communication: No apparent difficulties    Cognition Arousal: Alert Behavior During Therapy: WFL for tasks assessed/performed   PT - Cognitive impairments: No apparent impairments                         Following commands: Intact       Cueing Cueing Techniques: Verbal cues     General Comments      Exercises      Assessment/Plan    PT Assessment Patient does not need any further PT services  PT Problem List         PT Treatment Interventions      PT Goals (Current goals can be found in the Care Plan section)  Acute Rehab PT Goals PT Goal Formulation: All assessment and education complete, DC therapy    Frequency       Co-evaluation               AM-PAC PT 6 Clicks Mobility  Outcome Measure Help needed turning from your back to your side while in a flat bed without using bedrails?: None Help needed moving from lying on your back to sitting on the side of a flat bed without using bedrails?: None Help needed moving to and from a bed to a chair (including a wheelchair)?: A Little Help needed standing up from a chair using your arms (e.g., wheelchair or bedside chair)?: A Little Help needed to walk in hospital room?: A Little Help needed climbing 3-5 steps with a railing? : A Little 6 Click Score: 20    End of Session   Activity Tolerance: Patient tolerated treatment well Patient left: in chair;with call bell/phone within reach;with family/visitor present Nurse Communication: Mobility status PT Visit Diagnosis: Other abnormalities of gait and mobility (R26.89)    Time: 8680-8664 PT Time Calculation (min) (ACUTE ONLY): 16 min   Charges:   PT Evaluation $PT Eval Low Complexity: 1 Low   PT General Charges $$ ACUTE PT VISIT: 1 Visit         Lenoard SQUIBB, PT Acute Rehabilitation Services Office: [phone]   Lenoard NOVAK Reece Fehnel 10/06/2023, 1:45 PM

## 2023-10-06 NOTE — Consult Note (Signed)
 WOC consulted prior to patient's admission 7/2; K. Mayo WOC NP added wound care orders and I have followed up this am on the orders and patient status. Patient requested ACE to be removed, will not tolerate Coban or other therapeutic compression. Topical care is appropriate and we will adjust the frequency based on drainage report from staff. They are aware to re-consult if needed to adjust the frequency.   Discussed POC with bedside nurse.  Re consult if needed, will not follow at this time. Thanks  Ciera Beckum [employer], RN,CWOCN, CNS, CWON-AP [phone])

## 2023-10-06 NOTE — Discharge Summary (Incomplete)
 [address] [place] [address]             [phone]        Physician Discharge Summary  Patient ID: Jonathon Bray MRN: [id number] DOB/AGE: 58/58/1967 58 y.o.  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   Nonrheumatic aortic valve stenosis 05/11/2022   Family history of premature CAD 05/11/2022   Atherosclerosis of aorta (HCC) 05/11/2022   Aortic dilatation (HCC) 05/11/2022   Hyperlipidemia associated with type 2 diabetes mellitus (HCC) 05/11/2022   Benign hypertension 05/11/2022   Non-ST elevation (NSTEMI) myocardial infarction (HCC) 05/10/2022   Hypercholesteremia 05/10/2022   Type 2 diabetes mellitus with complication, with long-term current use of insulin  (HCC) 05/10/2022   NSTEMI (non-ST elevated myocardial infarction) (HCC) 05/10/2022   Abnormal cardiac enzyme level 05/10/2022   Spondylolysis, lumbar region 01/01/2021   Solitary pulmonary nodule on lung CT 04/02/2020   Orthostatic dizziness 01/24/2014   Primary hypertension 01/24/2014   Diabetes mellitus (HCC) 06/01/2012     Discharge Diagnoses:  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   Nonrheumatic aortic valve stenosis 05/11/2022   Family history of premature CAD 05/11/2022   Atherosclerosis of aorta (HCC) 05/11/2022   Aortic dilatation (HCC) 05/11/2022   Hyperlipidemia associated with type 2 diabetes mellitus (HCC) 05/11/2022    Benign hypertension 05/11/2022   Non-ST elevation (NSTEMI) myocardial infarction (HCC) 05/10/2022   Hypercholesteremia 05/10/2022   Type 2 diabetes mellitus with complication, with long-term current use of insulin  (HCC) 05/10/2022   NSTEMI (non-ST elevated myocardial infarction) (HCC) 05/10/2022   Abnormal cardiac enzyme level 05/10/2022   Spondylolysis, lumbar region 01/01/2021   Solitary pulmonary nodule on lung CT 04/02/2020   Orthostatic dizziness 01/24/2014   Primary hypertension 01/24/2014   Diabetes mellitus (HCC) 06/01/2012     Discharged Condition: {condition:18240}  History of Presenting Illness:  Jonathon Bray is a 58 year old man with a history of hypertension, type 2 insulin -dependent diabetes, stage II chronic kidney disease, 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 Kerrin earlier this year for management of severe aortic stenosis.  He ultimately had bioprosthetic aortic valve replacement on 09/22/2023 by Dr. Kerrin with a 23 mm Edwards Inspiris Resilia pericardial tissue valve.  The postoperative course was notable for silent aspiration early after surgery with improvement of his swallowing function by the time of his discharge.  He also had acute on chronic renal insufficiency postoperatively with a peak creatinine of 1.73.  He was diuresed aggressively postoperatively for significant volume excess with torsemide .  He was discharged on torsemide  with Unna boots in place to mitigate the lower extremity edema.  We attempted to arrange home health nursing along with home physical and occupational therapies but we could not get an agency to agree to come to his home located in [place] .  He was discharged home on 09/28/2023 and asked to return today for a postoperative checkup with Bmet  and to evaluate whether or not he needs to continue use of the Unna boots.   Jonathon Bray presented to the office today with his wife.  Overall, he feels  like he is making progress since his discharge to home.  He has been taking medications as prescribed and notes that he is lost a lot of water weight.  He had no difficulty swallowing since his discharge.  From an activity standpoint, he is primarily just been walking around inside the house.  He has not had any significant shortness of breath and the parasternal pain has been well-controlled.  He did develop a lot of discomfort related to the Unna boots and he took them off himself 2 days ago.  Since then, he has had persistent watery drainage from blisters on the anterior surface of both shins.  Dr. Lucas determined the patient would benefit from readmission. The patient was admitted to [facility] on 10/06/23.  Hospital Course: Upon admission the advanced heart failure team was consulted as well as wound care. Unna boots were replaced but they were removed about 3 hours later due to discomfort. His Toprol  XL 25mg  was continued and he was started on aggressive IV Lasix  80mg  BID per AHF team. Spironolactone 25mg  daily was also added. Echocardiogram was ordered and showed ***. Venous duplex studies on bilateral legs were ordered and showed no sign of DVT. He remained hyperglycemic, Semglee  and novolog  were increased, diabetes coordinator was consulted. He has a history of CKD stage 3, creatinine remained stable.   Consults: {consultation:18241}  Significant Diagnostic Studies:  Lower Venous DVT Study  Patient Name:  Jonathon Bray  Date of Exam:   10/06/2023 Medical Rec #: [id number]          Accession #:    [id number] Date of Birth: [birth year]          Patient Gender: M Patient Age:   58 years Exam Location:  [facility] Procedure:      VAS US  LOWER EXTREMITY VENOUS (DVT) Referring Phys: CON CHAMBERS   --------------------------------------------------------------------------- -----   Indications: Swelling, and Edema. Other Indications: Status post Aortic Valve Repair  09/22/23.  Performing Technologist: Ricka Sturdivant-Jones RDMS, RVT    Examination Guidelines: A complete evaluation includes B-mode imaging, spectral Doppler, color Doppler, and power Doppler as needed of all accessible portions of each vessel. Bilateral testing is considered an integral part of a complete examination. Limited examinations for reoccurring indications may be performed as noted. The reflux portion of the exam is performed with the patient in reverse Trendelenburg.     +---------+---------------+---------+-----------+----------+--------------+  RIGHT    CompressibilityPhasicitySpontaneityPropertiesThrombus Aging +---------+---------------+---------+-----------+----------+--------------+  CFV      Full           Yes      Yes                                  +---------+---------------+---------+-----------+----------+--------------+  SFJ      Full                                                         +---------+---------------+---------+-----------+----------+--------------+  FV Prox  Full                                                         +---------+---------------+---------+-----------+----------+--------------+  FV Mid   Full                                                         +---------+---------------+---------+-----------+----------+--------------+  FV DistalFull                                                         +---------+---------------+---------+-----------+----------+--------------+  PFV      Full                                                         +---------+---------------+---------+-----------+----------+--------------+  POP      Full           Yes      Yes                                  +---------+---------------+---------+-----------+----------+--------------+  PTV      Full                                                          +---------+---------------+---------+-----------+----------+--------------+  PERO     Full                                                         +---------+---------------+---------+-----------+----------+--------------+         +---------+---------------+---------+-----------+----------+--------------+  LEFT     CompressibilityPhasicitySpontaneityPropertiesThrombus Aging +---------+---------------+---------+-----------+----------+--------------+  CFV      Full           Yes      Yes                                  +---------+---------------+---------+-----------+----------+--------------+  SFJ      Full                                                         +---------+---------------+---------+-----------+----------+--------------+  FV Prox  Full                                                         +---------+---------------+---------+-----------+----------+--------------+  FV Mid   Full                                                         +---------+---------------+---------+-----------+----------+--------------+  FV DistalFull                                                         +---------+---------------+---------+-----------+----------+--------------+  PFV      Full                                                         +---------+---------------+---------+-----------+----------+--------------+  POP      Full           Yes      Yes                                  +---------+---------------+---------+-----------+----------+--------------+  PTV      Full                                                         +---------+---------------+---------+-----------+----------+--------------+  PERO     Full                                                         +---------+---------------+---------+-----------+----------+--------------+  Summary: BILATERAL: - No evidence  of deep vein thrombosis seen in the lower extremities, bilaterally. -No evidence of popliteal cyst, bilaterally.   *See table(s) above for measurements and observations.     Preliminary     Treatments: surgery: Operative Report    DATE OF PROCEDURE: 09/22/2023   PREOPERATIVE DIAGNOSIS:  Critical aortic stenosis with valvular cardiomyopathy.   POSTOPERATIVE DIAGNOSIS:  Critical aortic stenosis with valvular cardiomyopathy.   PROCEDURE:   Median sternotomy, extracorporeal circulation,  Aortic valve replacement with 23 mm Edwards Inspiris Resilia pericardial tissue valve (model #[id number], serial number [id number]).   SURGEON:  Elspeth BROCKS. Kerrin, MD   Discharge Exam: Blood pressure 120/66, pulse 90, temperature 97.7 F (36.5 C), temperature source Oral, resp. rate 17, height 5' 5 (1.651 m), weight 92.6 kg, SpO2 93%. {physical [id number]   Discharge Medications:  The patient has been discharged on:   1.Beta Blocker:  Yes [ X  ]                              No   [   ]                              If No, reason:  2.Ace Inhibitor/ARB: Yes [   ]                                     No  [    ]  If No, reason:  3.Statin:   Yes [ X  ]                  No  [   ]                  If No, reason:  4.Ecasa:  Yes  [  X ]                  No   [   ]                  If No, reason:  Patient had ACS upon admission: No  Plavix/P2Y12 inhibitor: Yes [   ]                                      No  [  X ]      Allergies as of 10/06/2023       Reactions   Invokana [canagliflozin] Other (See Comments)   Dizziness Syncope    Levaquin [levofloxacin] Rash   Zestril  [lisinopril ] Cough   Farxiga  [dapagliflozin ] Other (See Comments)   dizziness     Med Rec must be completed prior to using this [facility]***       Follow-up Information     Kerrin Elspeth BROCKS, MD Follow up on 11/01/2023.   Specialty: Cardiothoracic Surgery Why:  Appointment is at 11:45AM, please get a chest xray at 10:45AM on the 3rd floor of our office Contact information: [address] [place] [address] [phone]         Madie Jon Garre, PA Follow up on 10/26/2023.   Specialties: Cardiology, Radiology Why: Cardiology appointment is at 2:20PM Contact information: [address] [place] [address] [phone]         Michele Richardson, DO Follow up on 10/27/2023.   Specialties: Cardiology, Vascular Surgery Why: Cardiology follow up is at 4:00PM Contact information: [address] [place] [address] [phone]         [facility] CV Img Echo at [facility] [place] Follow up on 11/08/2023.   Specialty: Cardiology Why: Echocardiogram is at 7:20AM Contact information: [address] [place]  [address] [phone]                Signed:  Con GORMAN Bend, PA-C  10/06/2023, 3:09 PM

## 2023-10-07 DIAGNOSIS — Z952 Presence of prosthetic heart valve: Secondary | ICD-10-CM | POA: Diagnosis not present

## 2023-10-07 DIAGNOSIS — I447 Left bundle-branch block, unspecified: Secondary | ICD-10-CM

## 2023-10-07 LAB — BASIC METABOLIC PANEL WITH GFR
Anion gap: 6 (ref 5–15)
BUN: 34 mg/dL — ABNORMAL HIGH (ref 6–20)
CO2: 31 mmol/L (ref 22–32)
Calcium: 8.2 mg/dL — ABNORMAL LOW (ref 8.9–10.3)
Chloride: 97 mmol/L — ABNORMAL LOW (ref 98–111)
Creatinine, Ser: 1.03 mg/dL (ref 0.61–1.24)
GFR, Estimated: >60 mL/min (ref >60)
Glucose, Bld: 86 mg/dL (ref 70–99)
Potassium: 3.7 mmol/L (ref 3.5–5.1)
Sodium: 134 mmol/L — ABNORMAL LOW (ref 135–145)

## 2023-10-07 MED ORDER — POTASSIUM CHLORIDE CRYS ER 20 MEQ PO TBCR
20.0000 meq | EXTENDED_RELEASE_TABLET | Freq: Three times a day (TID) | ORAL | Status: AC
  Administered 2023-10-07 – 2023-10-08 (×6): 20 meq via ORAL
  Filled 2023-10-07 (×6): qty 1

## 2023-10-07 MED ORDER — TORSEMIDE 20 MG PO TABS
40.0000 mg | ORAL_TABLET | Freq: Every day | ORAL | Status: DC
  Administered 2023-10-07 – 2023-10-12 (×6): 40 mg via ORAL
  Filled 2023-10-07 (×6): qty 2

## 2023-10-07 NOTE — Progress Notes (Signed)
 Advanced Heart Failure Rounding Note  Cardiologist: Madonna Large, DO  Chief Complaint: Heart Failure  Subjective:   7/2: Diuresed with IV lasix .   Brisk diuresis noted.    Feeling a little better. Denies SOB.    Objective:   Weight Range: 92.5 kg Body mass index is 33.93 kg/m.   Vital Signs:   Temp:  [97.6 F (36.4 C)-97.8 F (36.6 C)] 97.6 F (36.4 C) (07/04 0713) Pulse Rate:  [75-90] 88 (07/04 0713) Resp:  [16-20] 17 (07/04 0713) BP: (114-126)/(65-78) 126/65 (07/04 0713) SpO2:  [93 %-97 %] 96 % (07/04 0713) Weight:  [92.5 kg] 92.5 kg (07/04 0329) Last BM Date : 10/05/23  Weight change: Filed Weights   10/05/23 2204 10/06/23 0348 10/07/23 0329  Weight: 91.6 kg 92.6 kg 92.5 kg    Intake/Output:   Intake/Output Summary (Last 24 hours) at 10/07/2023 0753 Last data filed at 10/06/2023 1300 Gross per 24 hour  Intake 240 ml  Output 975 ml  Net -735 ml      Physical Exam    General:   No resp difficulty Neck: supple. JVP 9-10  Cor: PMI nondisplaced. Regular rate & rhythm. No rubs, gallops or murmurs. Lungs: clear Abdomen: soft, nontender, nondistended.  Extremities: no cyanosis, clubbing, rash, R and LLE 1+ edema with dressing in place.  Neuro: alert & oriented x3   Telemetry   SR 80s   EKG    N/A  Labs    CBC Recent Labs    10/05/23 2003 10/06/23 0320  WBC 15.1* 12.7*  HGB 12.4* 11.6*  HCT 37.2* 35.1*  MCV 85.5 85.6  PLT 424* 411*   Basic Metabolic Panel Recent Labs    92/96/74 0320 10/07/23 0306  NA 137 134*  K 3.7 3.7  CL 94* 97*  CO2 32 31  GLUCOSE 155* 86  BUN 43* 34*  CREATININE 1.38* 1.03  CALCIUM  8.9 8.2*  MG 1.8  --    Liver Function Tests Recent Labs    10/05/23 2003  AST 19  ALT 12  ALKPHOS 66  BILITOT 0.6  PROT 7.0  ALBUMIN  2.7*   No results for input(s): LIPASE, AMYLASE in the last 72 hours. Cardiac Enzymes No results for input(s): CKTOTAL, CKMB, CKMBINDEX, TROPONINI in the last 72  hours.  BNP: BNP (last 3 results) Recent Labs    09/10/23 2142 09/16/23 1722 10/05/23 2003  BNP 440.4* 376.4* 459.1*    ProBNP (last 3 results) Recent Labs    06/06/23 1633 06/28/23 1402 07/27/23 1314  PROBNP 1,093* 1,172* 1,063*     D-Dimer No results for input(s): DDIMER in the last 72 hours. Hemoglobin A1C No results for input(s): HGBA1C in the last 72 hours. Fasting Lipid Panel No results for input(s): CHOL, HDL, LDLCALC, TRIG, CHOLHDL, LDLDIRECT in the last 72 hours. Thyroid Function Tests No results for input(s): TSH, T4TOTAL, T3FREE, THYROIDAB in the last 72 hours.  Invalid input(s): FREET3  Other results:   Imaging    VAS US  LOWER EXTREMITY VENOUS (DVT) Result Date: 10/06/2023  Lower Venous DVT Study Patient Name:  Jonathon Bray  Date of Exam:   10/06/2023 Medical Rec #: [id number]          Accession #:    [id number] Date of Birth: 08-25-1965          Patient Gender: M Patient Age:   58 years Exam Location:  [facility] Procedure:      VAS US  LOWER EXTREMITY VENOUS (DVT) Referring  Phys: BAILEY CHAMBERS --------------------------------------------------------------------------------  Indications: Swelling, and Edema. Other Indications: Status post Aortic Valve Repair 09/22/23. Performing Technologist: Ricka Sturdivant-Jones RDMS, RVT  Examination Guidelines: A complete evaluation includes B-mode imaging, spectral Doppler, color Doppler, and power Doppler as needed of all accessible portions of each vessel. Bilateral testing is considered an integral part of a complete examination. Limited examinations for reoccurring indications may be performed as noted. The reflux portion of the exam is performed with the patient in reverse Trendelenburg.  +---------+---------------+---------+-----------+----------+--------------+ RIGHT    CompressibilityPhasicitySpontaneityPropertiesThrombus Aging  +---------+---------------+---------+-----------+----------+--------------+ CFV      Full           Yes      Yes                                 +---------+---------------+---------+-----------+----------+--------------+ SFJ      Full                                                        +---------+---------------+---------+-----------+----------+--------------+ FV Prox  Full                                                        +---------+---------------+---------+-----------+----------+--------------+ FV Mid   Full                                                        +---------+---------------+---------+-----------+----------+--------------+ FV DistalFull                                                        +---------+---------------+---------+-----------+----------+--------------+ PFV      Full                                                        +---------+---------------+---------+-----------+----------+--------------+ POP      Full           Yes      Yes                                 +---------+---------------+---------+-----------+----------+--------------+ PTV      Full                                                        +---------+---------------+---------+-----------+----------+--------------+ PERO     Full                                                        +---------+---------------+---------+-----------+----------+--------------+   +---------+---------------+---------+-----------+----------+--------------+  LEFT     CompressibilityPhasicitySpontaneityPropertiesThrombus Aging +---------+---------------+---------+-----------+----------+--------------+ CFV      Full           Yes      Yes                                 +---------+---------------+---------+-----------+----------+--------------+ SFJ      Full                                                         +---------+---------------+---------+-----------+----------+--------------+ FV Prox  Full                                                        +---------+---------------+---------+-----------+----------+--------------+ FV Mid   Full                                                        +---------+---------------+---------+-----------+----------+--------------+ FV DistalFull                                                        +---------+---------------+---------+-----------+----------+--------------+ PFV      Full                                                        +---------+---------------+---------+-----------+----------+--------------+ POP      Full           Yes      Yes                                 +---------+---------------+---------+-----------+----------+--------------+ PTV      Full                                                        +---------+---------------+---------+-----------+----------+--------------+ PERO     Full                                                        +---------+---------------+---------+-----------+----------+--------------+     Summary: BILATERAL: - No evidence of deep vein thrombosis seen in the lower extremities, bilaterally. -No evidence of popliteal cyst, bilaterally.   *See table(s) above for measurements and observations. Electronically signed by Norman Serve on 10/06/2023 at 6:28:05 PM.  Final    ECHOCARDIOGRAM COMPLETE Result Date: 10/06/2023    ECHOCARDIOGRAM REPORT   Patient Name:   Jonathon Bray Date of Exam: 10/06/2023 Medical Rec #:  [id number]         Height:       65.0 in Accession #:    [id number]        Weight:       204.1 lb Date of Birth:  [birth year]         BSA:          1.995 m Patient Age:    57 years          BP:           136/78 mmHg Patient Gender: M                 HR:           85 bpm. Exam Location:  Inpatient Procedure: 2D Echo, Cardiac Doppler, Color Doppler and Intracardiac             Opacification Agent (Both Spectral and Color Flow Doppler were            utilized during procedure). Indications:    Congestive Heart Failure I50.9  History:        Patient has prior history of Echocardiogram examinations, most                 recent 09/11/2023. Aortic Valve Disease; Risk Factors:Hypertension                 and Diabetes.                 Aortic Valve: 23 mm INSPIRIS RESILIA AORTIC pericardial valve is                 present in the aortic position. Procedure Date: 09/22/23.  Sonographer:    Jayson Gaskins Referring Phys: [id number] [place] IMPRESSIONS  1. Cannot exclude hypokinesis of inferior wall, even with use of echo contrast. Significant LV dyssynchrony noted. Left ventricular ejection fraction, by estimation, is 45 to 50%. The left ventricle has mildly decreased function. The left ventricle demonstrates global hypokinesis. Left ventricular diastolic parameters are indeterminate.  2. Right ventricular systolic function is mildly reduced. The right ventricular size is normal.  3. Left atrial size was mildly dilated.  4. The mitral valve is grossly normal. Trivial mitral valve regurgitation. No evidence of mitral stenosis.  5. The aortic valve has been repaired/replaced. Aortic valve regurgitation is not visualized. There is a 23 mm INSPIRIS RESILIA AORTIC pericardial valve present in the aortic position. Procedure Date: 09/22/23. Echo findings are consistent with normal structure and function of the aortic valve prosthesis. Aortic valve mean gradient measures 8.0 mmHg. Comparison(s): Changes from prior study are noted. Conclusion(s)/Recommendation(s): S/P surgical AVR since last echo. EF improved from prior 20-25% to 45-50% with LV dyssynchrony. FINDINGS  Left Ventricle: Cannot exclude hypokinesis of inferior wall, even with use of echo contrast. Significant LV dyssynchrony noted. Left ventricular ejection fraction, by estimation, is 45 to 50%. The left ventricle has mildly decreased function. The  left ventricle demonstrates global hypokinesis. Definity  contrast agent was given IV to delineate the left ventricular endocardial borders. The left ventricular internal cavity size was normal in size. There is borderline left ventricular hypertrophy. Abnormal (paradoxical) septal motion, consistent with left bundle branch block. Left ventricular diastolic parameters are indeterminate. Right Ventricle: The right ventricular size is normal. Right vetricular wall thickness was  not well visualized. Right ventricular systolic function is mildly reduced. Left Atrium: Left atrial size was mildly dilated. Right Atrium: Right atrial size was normal in size. Pericardium: There is no evidence of pericardial effusion. Mitral Valve: The mitral valve is grossly normal. Trivial mitral valve regurgitation. No evidence of mitral valve stenosis. Tricuspid Valve: The tricuspid valve is not well visualized. Tricuspid valve regurgitation is trivial. No evidence of tricuspid stenosis. Aortic Valve: The aortic valve has been repaired/replaced. Aortic valve regurgitation is not visualized. Aortic valve mean gradient measures 8.0 mmHg. Aortic valve peak gradient measures 13.8 mmHg. Aortic valve area, by VTI measures 2.39 cm. There is a 23 mm INSPIRIS RESILIA AORTIC pericardial valve present in the aortic position. Procedure Date: 09/22/23. Echo findings are consistent with normal structure and function of the aortic valve prosthesis. Pulmonic Valve: The pulmonic valve was not well visualized. Pulmonic valve regurgitation is not visualized. No evidence of pulmonic stenosis. Aorta: The aortic root is normal in size and structure, the ascending aorta was not well visualized and the aortic arch was not well visualized. Venous: The inferior vena cava was not well visualized. IAS/Shunts: The atrial septum is grossly normal.  LEFT VENTRICLE PLAX 2D LVIDd:         4.40 cm   Diastology LVIDs:         3.00 cm   LV e' medial:    5.87 cm/s LV PW:          1.10 cm   LV E/e' medial:  21.3 LV IVS:        0.90 cm   LV e' lateral:   9.46 cm/s LVOT diam:     1.90 cm   LV E/e' lateral: 13.2 LV SV:         81 LV SV Index:   40 LVOT Area:     2.84 cm  RIGHT VENTRICLE RV S prime:     9.03 cm/s TAPSE (M-mode): 1.2 cm LEFT ATRIUM             Index        RIGHT ATRIUM           Index LA Vol (A2C):   30.9 ml 15.49 ml/m  RA Area:     15.35 cm LA Vol (A4C):   67.5 ml 33.83 ml/m  RA Volume:   37.90 ml  18.99 ml/m LA Biplane Vol: 47.2 ml 23.66 ml/m  AORTIC VALVE AV Area (Vmax):    2.32 cm AV Area (Vmean):   2.30 cm AV Area (VTI):     2.39 cm AV Vmax:           186.00 cm/s AV Vmean:          139.000 cm/s AV VTI:            0.337 m AV Peak Grad:      13.8 mmHg AV Mean Grad:      8.0 mmHg LVOT Vmax:         152.00 cm/s LVOT Vmean:        113.000 cm/s LVOT VTI:          0.284 m LVOT/AV VTI ratio: 0.84  AORTA Ao Root diam: 3.20 cm MITRAL VALVE MV Area (PHT): 3.40 cm     SHUNTS MV Decel Time: 223 msec     Systemic VTI:  0.28 m MV E velocity: 125.00 cm/s  Systemic Diam: 1.90 cm MV A velocity: 94.90 cm/s MV E/A ratio:  1.32 Shelda Bruckner MD  Electronically signed by Shelda Bruckner MD Signature Date/Time: 10/06/2023/4:38:23 PM    Final      Medications:     Scheduled Medications:  aspirin  EC  325 mg Oral Daily   atorvastatin   40 mg Oral QHS   docusate sodium   100 mg Oral BID   enoxaparin  (LOVENOX ) injection  40 mg Subcutaneous Q24H   gabapentin   100 mg Oral BID   insulin  aspart  0-5 Units Subcutaneous QHS   insulin  aspart  0-9 Units Subcutaneous TID WC   insulin  aspart  18 Units Subcutaneous TID WC   insulin  glargine-yfgn  24 Units Subcutaneous BID   metoprolol  succinate  25 mg Oral Daily   sodium chloride  flush  3 mL Intravenous Q12H   spironolactone   25 mg Oral Daily    Infusions:   PRN Medications: acetaminophen  **OR** acetaminophen , ondansetron  (ZOFRAN ) IV, oxyCODONE     Patient Profile   58 y.o. male with history of HFrEF, HTN, T2DM,  CKD 2, and aortic stenosis s/p bioprothestic AVR with Dr. Kerrin on 09/22/23.   Assessment/Plan   1. Acute on chronic systolic CHF: Nonischemic cardiomyopathy.  ?Valvular related to severe AS versus LBBB cardiomyopathy.  Cath in pre-AVR showed no significant CAD but markedly elevated filling pressures.  Echo in 6/25 (pre-op) showed EF 20-25% with septal-lateral dyssynchrony, normal RV function, severe aortic stenosis.  Patient is now s/p bioprosthetic AVR.  Post-op TTE shows EF 45-50%. - Volume status improving - IVC < 1cm on TTE but clearly still with some edema. Renal function has normalized. Consider switching to PO diuretics today. PTA he was taking torsemide  20 mg twice a day.  - continue spiro 25 mg daily, Toprol  XL 25 mg daily   - Add Entresto  24-26  - Allergy to  jardiance and invokana- syncope/dizziness.       2. Aortic stenosis: S/p bioprosthetic AVR in 6/25.  - Echo to reassess valve.   3. LBBB - no need for CRT with recovering EF   4. CKD stage 3: Creatinine baseline ~ 1.5-1.7 Stable.   5. Venous stasis ulcers: R and LLE wounds. WOC consulted.    Length of Stay: 2  Eulas FORBES Furbish, MD  10/07/2023, 7:53 AM

## 2023-10-07 NOTE — Progress Notes (Signed)
Pt ambulated x 470 feet with front wheel walker, pt tolerated well  

## 2023-10-07 NOTE — Progress Notes (Signed)
 Mobility Specialist Progress Note:   10/07/23 1023  Mobility  Activity Ambulated independently to bathroom;Dangled on edge of bed  Level of Assistance Standby assist, set-up cues, supervision of patient - no hands on  Assistive Device Cane  Distance Ambulated (ft) 25 ft  RUE Weight Bearing Per Provider Order NWB  LUE Weight Bearing Per Provider Order NWB  Activity Response Tolerated well  Mobility Referral Yes  Mobility visit 1 Mobility  Mobility Specialist Start Time (ACUTE ONLY) 1015  Mobility Specialist Stop Time (ACUTE ONLY) 1023  Mobility Specialist Time Calculation (min) (ACUTE ONLY) 8 min   Pt received ambulating from bathroom, decline ambulation at this time as he's waiting for wife to arrive. Tolerated ambulation well, HR in 90s. SV with Cane for safety. Pt sitting EOB with all needs met. Will attempt to f/u and ambulate in hallways if time permits.    Yilia Sacca Mobility Specialist Please contact via Special educational needs teacher or  Rehab office at [phone]

## 2023-10-07 NOTE — Progress Notes (Addendum)
 Subjective: Feeling better , hoping to go home soon  Objective: Vital signs in last 24 hours: Temp:  [97.6 F (36.4 C)-97.8 F (36.6 C)] 97.6 F (36.4 C) (07/04 0713) Pulse Rate:  [75-90] 88 (07/04 0713) Cardiac Rhythm: Normal sinus rhythm;Bundle branch block (07/03 1900) Resp:  [16-20] 17 (07/04 0713) BP: (114-136)/(65-78) 126/65 (07/04 0713) SpO2:  [93 %-97 %] 96 % (07/04 0713) Weight:  [92.5 kg] 92.5 kg (07/04 0329)  Hemodynamic parameters for last 24 hours:    Intake/Output from previous day: 07/03 0701 - 07/04 0700 In: 240 [P.O.:240] Out: 975 [Urine:975] Intake/Output this shift: No intake/output data recorded.  General appearance: alert, cooperative, and no distress Heart: regular rate and rhythm Lungs: dim in bases Abdomen: benign Extremities: + edema- dressings CDI Wound: sternotomy incision healing well  Lab Results: Recent Labs    10/05/23 2003 10/06/23 0320  WBC 15.1* 12.7*  HGB 12.4* 11.6*  HCT 37.2* 35.1*  PLT 424* 411*   BMET:  Recent Labs    10/06/23 0320 10/07/23 0306  NA 137 134*  K 3.7 3.7  CL 94* 97*  CO2 32 31  GLUCOSE 155* 86  BUN 43* 34*  CREATININE 1.38* 1.03  CALCIUM  8.9 8.2*    PT/INR: No results for input(s): LABPROT, INR in the last 72 hours. ABG    Component Value Date/Time   PHART 7.260 (L) 09/22/2023 1758   HCO3 20.2 09/22/2023 1758   TCO2 22 09/22/2023 1758   ACIDBASEDEF 7.0 (H) 09/22/2023 1758   O2SAT 91 09/22/2023 1758   CBG (last 3)  Recent Labs    10/06/23 0755 10/06/23 1136 10/06/23 1219  GLUCAP 246* 59* 85    Meds Scheduled Meds:  aspirin  EC  325 mg Oral Daily   atorvastatin   40 mg Oral QHS   docusate sodium   100 mg Oral BID   enoxaparin  (LOVENOX ) injection  40 mg Subcutaneous Q24H   gabapentin   100 mg Oral BID   insulin  aspart  0-5 Units Subcutaneous QHS   insulin  aspart  0-9 Units Subcutaneous TID WC   insulin  aspart  18 Units Subcutaneous TID WC   insulin  glargine-yfgn  24 Units  Subcutaneous BID   metoprolol  succinate  25 mg Oral Daily   sodium chloride  flush  3 mL Intravenous Q12H   spironolactone   25 mg Oral Daily   Continuous Infusions: PRN Meds:.acetaminophen  **OR** acetaminophen , ondansetron  (ZOFRAN ) IV, oxyCODONE   Xrays VAS US  LOWER EXTREMITY VENOUS (DVT) Result Date: 10/06/2023  Lower Venous DVT Study Patient Name:  ALOK MINSHALL  Date of Exam:   10/06/2023 Medical Rec #: [id number]          Accession #:    [id number] Date of Birth: 1965/09/14          Patient Gender: M Patient Age:   58 years Exam Location:  [facility] Procedure:      VAS US  LOWER EXTREMITY VENOUS (DVT) Referring Phys: CON CHAMBERS --------------------------------------------------------------------------------  Indications: Swelling, and Edema. Other Indications: Status post Aortic Valve Repair 09/22/23. Performing Technologist: Ricka Sturdivant-Jones RDMS, RVT  Examination Guidelines: A complete evaluation includes B-mode imaging, spectral Doppler, color Doppler, and power Doppler as needed of all accessible portions of each vessel. Bilateral testing is considered an integral part of a complete examination. Limited examinations for reoccurring indications may be performed as noted. The reflux portion of the exam is performed with the patient in reverse Trendelenburg.  +---------+---------------+---------+-----------+----------+--------------+ RIGHT    CompressibilityPhasicitySpontaneityPropertiesThrombus Aging +---------+---------------+---------+-----------+----------+--------------+ CFV  Full           Yes      Yes                                 +---------+---------------+---------+-----------+----------+--------------+ SFJ      Full                                                        +---------+---------------+---------+-----------+----------+--------------+ FV Prox  Full                                                         +---------+---------------+---------+-----------+----------+--------------+ FV Mid   Full                                                        +---------+---------------+---------+-----------+----------+--------------+ FV DistalFull                                                        +---------+---------------+---------+-----------+----------+--------------+ PFV      Full                                                        +---------+---------------+---------+-----------+----------+--------------+ POP      Full           Yes      Yes                                 +---------+---------------+---------+-----------+----------+--------------+ PTV      Full                                                        +---------+---------------+---------+-----------+----------+--------------+ PERO     Full                                                        +---------+---------------+---------+-----------+----------+--------------+   +---------+---------------+---------+-----------+----------+--------------+ LEFT     CompressibilityPhasicitySpontaneityPropertiesThrombus Aging +---------+---------------+---------+-----------+----------+--------------+ CFV      Full           Yes      Yes                                 +---------+---------------+---------+-----------+----------+--------------+  SFJ      Full                                                        +---------+---------------+---------+-----------+----------+--------------+ FV Prox  Full                                                        +---------+---------------+---------+-----------+----------+--------------+ FV Mid   Full                                                        +---------+---------------+---------+-----------+----------+--------------+ FV DistalFull                                                         +---------+---------------+---------+-----------+----------+--------------+ PFV      Full                                                        +---------+---------------+---------+-----------+----------+--------------+ POP      Full           Yes      Yes                                 +---------+---------------+---------+-----------+----------+--------------+ PTV      Full                                                        +---------+---------------+---------+-----------+----------+--------------+ PERO     Full                                                        +---------+---------------+---------+-----------+----------+--------------+     Summary: BILATERAL: - No evidence of deep vein thrombosis seen in the lower extremities, bilaterally. -No evidence of popliteal cyst, bilaterally.   *See table(s) above for measurements and observations. Electronically signed by Norman Serve on 10/06/2023 at 6:28:05 PM.    Final    ECHOCARDIOGRAM COMPLETE Result Date: 10/06/2023    ECHOCARDIOGRAM REPORT   Patient Name:   AZIM GILLINGHAM Date of Exam: 10/06/2023 Medical Rec #:  [id number]         Height:       65.0 in Accession #:    [id number]        Weight:  204.1 lb Date of Birth:  [birth year]         BSA:          1.995 m Patient Age:    57 years          BP:           136/78 mmHg Patient Gender: M                 HR:           85 bpm. Exam Location:  Inpatient Procedure: 2D Echo, Cardiac Doppler, Color Doppler and Intracardiac            Opacification Agent (Both Spectral and Color Flow Doppler were            utilized during procedure). Indications:    Congestive Heart Failure I50.9  History:        Patient has prior history of Echocardiogram examinations, most                 recent 09/11/2023. Aortic Valve Disease; Risk Factors:Hypertension                 and Diabetes.                 Aortic Valve: 23 mm INSPIRIS RESILIA AORTIC pericardial valve is                 present in the  aortic position. Procedure Date: 09/22/23.  Sonographer:    Jayson Gaskins Referring Phys: [id number] [place] IMPRESSIONS  1. Cannot exclude hypokinesis of inferior wall, even with use of echo contrast. Significant LV dyssynchrony noted. Left ventricular ejection fraction, by estimation, is 45 to 50%. The left ventricle has mildly decreased function. The left ventricle demonstrates global hypokinesis. Left ventricular diastolic parameters are indeterminate.  2. Right ventricular systolic function is mildly reduced. The right ventricular size is normal.  3. Left atrial size was mildly dilated.  4. The mitral valve is grossly normal. Trivial mitral valve regurgitation. No evidence of mitral stenosis.  5. The aortic valve has been repaired/replaced. Aortic valve regurgitation is not visualized. There is a 23 mm INSPIRIS RESILIA AORTIC pericardial valve present in the aortic position. Procedure Date: 09/22/23. Echo findings are consistent with normal structure and function of the aortic valve prosthesis. Aortic valve mean gradient measures 8.0 mmHg. Comparison(s): Changes from prior study are noted. Conclusion(s)/Recommendation(s): S/P surgical AVR since last echo. EF improved from prior 20-25% to 45-50% with LV dyssynchrony. FINDINGS  Left Ventricle: Cannot exclude hypokinesis of inferior wall, even with use of echo contrast. Significant LV dyssynchrony noted. Left ventricular ejection fraction, by estimation, is 45 to 50%. The left ventricle has mildly decreased function. The left ventricle demonstrates global hypokinesis. Definity  contrast agent was given IV to delineate the left ventricular endocardial borders. The left ventricular internal cavity size was normal in size. There is borderline left ventricular hypertrophy. Abnormal (paradoxical) septal motion, consistent with left bundle branch block. Left ventricular diastolic parameters are indeterminate. Right Ventricle: The right ventricular size is normal. Right  vetricular wall thickness was not well visualized. Right ventricular systolic function is mildly reduced. Left Atrium: Left atrial size was mildly dilated. Right Atrium: Right atrial size was normal in size. Pericardium: There is no evidence of pericardial effusion. Mitral Valve: The mitral valve is grossly normal. Trivial mitral valve regurgitation. No evidence of mitral valve stenosis. Tricuspid Valve: The tricuspid valve is not well visualized. Tricuspid valve regurgitation is trivial. No evidence  of tricuspid stenosis. Aortic Valve: The aortic valve has been repaired/replaced. Aortic valve regurgitation is not visualized. Aortic valve mean gradient measures 8.0 mmHg. Aortic valve peak gradient measures 13.8 mmHg. Aortic valve area, by VTI measures 2.39 cm. There is a 23 mm INSPIRIS RESILIA AORTIC pericardial valve present in the aortic position. Procedure Date: 09/22/23. Echo findings are consistent with normal structure and function of the aortic valve prosthesis. Pulmonic Valve: The pulmonic valve was not well visualized. Pulmonic valve regurgitation is not visualized. No evidence of pulmonic stenosis. Aorta: The aortic root is normal in size and structure, the ascending aorta was not well visualized and the aortic arch was not well visualized. Venous: The inferior vena cava was not well visualized. IAS/Shunts: The atrial septum is grossly normal.  LEFT VENTRICLE PLAX 2D LVIDd:         4.40 cm   Diastology LVIDs:         3.00 cm   LV e' medial:    5.87 cm/s LV PW:         1.10 cm   LV E/e' medial:  21.3 LV IVS:        0.90 cm   LV e' lateral:   9.46 cm/s LVOT diam:     1.90 cm   LV E/e' lateral: 13.2 LV SV:         81 LV SV Index:   40 LVOT Area:     2.84 cm  RIGHT VENTRICLE RV S prime:     9.03 cm/s TAPSE (M-mode): 1.2 cm LEFT ATRIUM             Index        RIGHT ATRIUM           Index LA Vol (A2C):   30.9 ml 15.49 ml/m  RA Area:     15.35 cm LA Vol (A4C):   67.5 ml 33.83 ml/m  RA Volume:   37.90 ml   18.99 ml/m LA Biplane Vol: 47.2 ml 23.66 ml/m  AORTIC VALVE AV Area (Vmax):    2.32 cm AV Area (Vmean):   2.30 cm AV Area (VTI):     2.39 cm AV Vmax:           186.00 cm/s AV Vmean:          139.000 cm/s AV VTI:            0.337 m AV Peak Grad:      13.8 mmHg AV Mean Grad:      8.0 mmHg LVOT Vmax:         152.00 cm/s LVOT Vmean:        113.000 cm/s LVOT VTI:          0.284 m LVOT/AV VTI ratio: 0.84  AORTA Ao Root diam: 3.20 cm MITRAL VALVE MV Area (PHT): 3.40 cm     SHUNTS MV Decel Time: 223 msec     Systemic VTI:  0.28 m MV E velocity: 125.00 cm/s  Systemic Diam: 1.90 cm MV A velocity: 94.90 cm/s MV E/A ratio:  1.32 Shelda Bruckner MD Electronically signed by Shelda Bruckner MD Signature Date/Time: 10/06/2023/4:38:23 PM    Final    DG CHEST PORT 1 VIEW Result Date: 10/05/2023 CLINICAL DATA:  [id number] Heart failure (HCC) [id number]. EXAM: PORTABLE CHEST 1 VIEW COMPARISON:  Radiograph from earlier the same day, 12:07 p.m. FINDINGS: Low lung volume. There is small right pleural effusion, unchanged. Left lateral costophrenic angle is clear. Bilateral lung fields are  clear. Stable cardio-mediastinal silhouette. Sternotomy wires and prosthetic aortic valve again seen. No acute osseous abnormalities. The soft tissues are within normal limits. IMPRESSION: *Stable exam. Small right pleural effusion. Electronically Signed   By: Ree Molt M.D.   On: 10/05/2023 16:56   DG Chest 2 View Result Date: 10/05/2023 CLINICAL DATA:  Status post aortic valve replacement EXAM: CHEST - 2 VIEW COMPARISON:  09/26/2023 FINDINGS: Cardiomediastinal silhouette and pulmonary vasculature are within normal limits. Prosthetic aortic valve again seen. Small RIGHT pleural effusion is present. Unchanged compression deformities of the T10 and L2 vertebral bodies. IMPRESSION: Unchanged small RIGHT pleural effusion. Electronically Signed   By: Aliene Lloyd M.D.   On: 10/05/2023 12:40    Assessment/Plan:  1 afeb, VSS, sinus  rhythm,LBBB, has HFrEF- AHF on board assisting w/ GDMT/diuresis,  AVR looks good on echo-also  see report above 2 O2 sats good on RA 3 I/O- 975 ml /24 H recorded, likely not accurate, weight stable c/w yesterday 4 creat has normalized, BUN improved to 34- was in 40's, has CKD 2 5 CBG 59-324 range yesterday- insulins adjusted yesterday- will cont to monitor- poor control at home, DM coordinator assisting w/ management 6 leg wounds - didn't tol UNNA boots, monitor clinically, no current signs of infection- likely venous stasis and CHF combo,no  DVT on vasc study- cont dressing changes 7 PT/OT ordered and seen, no PT needs, cont OT  8 routine pulm hygiene   LOS: 2 days    Lemond FORBES Cera PA-C Pager [phone] 10/07/2023   Chart reviewed, patient examined, agree with above.  Edema in legs looks to be improving although no I/O and wt not changed much. Will start Demedex 40 daily and Kdur.  He had cough with lisinopril  so he may have that with Entresto . Encouraged to keep legs up when not ambulating. Venous duplex negative for DVT.

## 2023-10-08 DIAGNOSIS — I5023 Acute on chronic systolic (congestive) heart failure: Secondary | ICD-10-CM

## 2023-10-08 DIAGNOSIS — Z952 Presence of prosthetic heart valve: Secondary | ICD-10-CM | POA: Diagnosis not present

## 2023-10-08 LAB — BASIC METABOLIC PANEL WITH GFR
Anion gap: 5 (ref 5–15)
BUN: 32 mg/dL — ABNORMAL HIGH (ref 6–20)
CO2: 32 mmol/L (ref 22–32)
Calcium: 8.5 mg/dL — ABNORMAL LOW (ref 8.9–10.3)
Chloride: 99 mmol/L (ref 98–111)
Creatinine, Ser: 1.13 mg/dL (ref 0.61–1.24)
GFR, Estimated: >60 mL/min
Glucose, Bld: 86 mg/dL (ref 70–99)
Potassium: 3.8 mmol/L (ref 3.5–5.1)
Sodium: 136 mmol/L (ref 135–145)

## 2023-10-08 MED ORDER — INSULIN ASPART 100 UNIT/ML IJ SOLN
10.0000 [IU] | Freq: Three times a day (TID) | INTRAMUSCULAR | Status: DC
  Administered 2023-10-08 (×3): 10 [IU] via SUBCUTANEOUS

## 2023-10-08 MED ORDER — LOSARTAN POTASSIUM 25 MG PO TABS
12.5000 mg | ORAL_TABLET | Freq: Every day | ORAL | Status: DC
  Administered 2023-10-08 – 2023-10-09 (×2): 12.5 mg via ORAL
  Filled 2023-10-08 (×2): qty 1

## 2023-10-08 MED ORDER — CEFAZOLIN SODIUM-DEXTROSE 1-4 GM/50ML-% IV SOLN
1.0000 g | Freq: Three times a day (TID) | INTRAVENOUS | Status: DC
  Administered 2023-10-08 – 2023-10-15 (×22): 1 g via INTRAVENOUS
  Filled 2023-10-08 (×27): qty 50

## 2023-10-08 NOTE — Progress Notes (Signed)
 Cbg 166 @ 12

## 2023-10-08 NOTE — Progress Notes (Signed)
 Pt called and said  glucose was low, we checked and blood sugar was 71 juice and crackers were given to patient. Blood sugars are not crossing over in epic

## 2023-10-08 NOTE — Progress Notes (Signed)
 CBG 134 @ 1633

## 2023-10-08 NOTE — Plan of Care (Signed)
   Problem: Education: Goal: Knowledge of General Education information will improve Description Including pain rating scale, medication(s)/side effects and non-pharmacologic comfort measures Outcome: Progressing   Problem: Health Behavior/Discharge Planning: Goal: Ability to manage health-related needs will improve Outcome: Progressing

## 2023-10-08 NOTE — Progress Notes (Signed)
 BS 112

## 2023-10-08 NOTE — Progress Notes (Signed)
 Progress Note  Patient Name: Jonathon Bray Date of Encounter: 10/08/2023  Primary Cardiologist:   Madonna Large, DO   Subjective   No chest pain.  No SOB.   Inpatient Medications    Scheduled Meds:  aspirin  EC  325 mg Oral Daily   atorvastatin   40 mg Oral QHS   docusate sodium   100 mg Oral BID   enoxaparin  (LOVENOX ) injection  40 mg Subcutaneous Q24H   gabapentin   100 mg Oral BID   insulin  aspart  0-5 Units Subcutaneous QHS   insulin  aspart  0-9 Units Subcutaneous TID WC   insulin  aspart  10 Units Subcutaneous TID WC   insulin  glargine-yfgn  24 Units Subcutaneous BID   metoprolol  succinate  25 mg Oral Daily   potassium chloride   20 mEq Oral TID   sodium chloride  flush  3 mL Intravenous Q12H   spironolactone   25 mg Oral Daily   torsemide   40 mg Oral Daily   Continuous Infusions:   ceFAZolin  (ANCEF ) IV 1 g (10/08/23 0948)   PRN Meds: acetaminophen  **OR** acetaminophen , ondansetron  (ZOFRAN ) IV, oxyCODONE    Vital Signs    Vitals:   10/07/23 1940 10/07/23 2155 10/08/23 0411 10/08/23 0731  BP: (!) 125/91 118/78  125/72  Pulse: 87 87  83  Resp: 19 19  17   Temp: 97.9 F (36.6 C) 98 F (36.7 C)  97.6 F (36.4 C)  TempSrc: Oral Oral  Oral  SpO2: 99% 93%  99%  Weight:   92.3 kg   Height:        Intake/Output Summary (Last 24 hours) at 10/08/2023 1015 Last data filed at 10/08/2023 0600 Gross per 24 hour  Intake 120 ml  Output 1300 ml  Net -1180 ml   Filed Weights   10/06/23 0348 10/07/23 0329 10/08/23 0411  Weight: 92.6 kg 92.5 kg 92.3 kg    Telemetry    NSR - Personally Reviewed  ECG    NA - Personally Reviewed  Physical Exam   GEN: No acute distress.   Neck: No  JVD Cardiac: RRR, no murmurs, rubs, or gallops.  Respiratory: Clear  to auscultation bilaterally. GI: Soft, nontender, non-distended  MS: No  edema; No deformity. Neuro:  Nonfocal  Psych: Normal affect   Labs    Chemistry Recent Labs  Lab 10/05/23 2003 10/06/23 0320  10/07/23 0306 10/08/23 0300  NA 138 137 134* 136  K 4.2 3.7 3.7 3.8  CL 90* 94* 97* 99  CO2 33* 32 31 32  GLUCOSE 232* 155* 86 86  BUN 41* 43* 34* 32*  CREATININE 1.60* 1.38* 1.03 1.13  CALCIUM  9.1 8.9 8.2* 8.5*  PROT 7.0  --   --   --   ALBUMIN  2.7*  --   --   --   AST 19  --   --   --   ALT 12  --   --   --   ALKPHOS 66  --   --   --   BILITOT 0.6  --   --   --   GFRNONAA 50* 60* >60 >60  ANIONGAP 15 11 6 5      Hematology Recent Labs  Lab 10/05/23 2003 10/06/23 0320  WBC 15.1* 12.7*  RBC 4.35 4.10*  HGB 12.4* 11.6*  HCT 37.2* 35.1*  MCV 85.5 85.6  MCH 28.5 28.3  MCHC 33.3 33.0  RDW 12.6 12.6  PLT 424* 411*    Cardiac EnzymesNo results for input(s): TROPONINI in the  last 168 hours. No results for input(s): TROPIPOC in the last 168 hours.   BNP Recent Labs  Lab 10/05/23 2003  BNP 459.1*     DDimer No results for input(s): DDIMER in the last 168 hours.   Radiology    VAS US  LOWER EXTREMITY VENOUS (DVT) Result Date: 10/06/2023  Lower Venous DVT Study Patient Name:  Jonathon Bray  Date of Exam:   10/06/2023 Medical Rec #: [id number]          Accession #:    [id number] Date of Birth: 02/17/1966          Patient Gender: M Patient Age:   2 years Exam Location:  [facility] Procedure:      VAS US  LOWER EXTREMITY VENOUS (DVT) Referring Phys: CON CHAMBERS --------------------------------------------------------------------------------  Indications: Swelling, and Edema. Other Indications: Status post Aortic Valve Repair 09/22/23. Performing Technologist: Ricka Sturdivant-Jones RDMS, RVT  Examination Guidelines: A complete evaluation includes B-mode imaging, spectral Doppler, color Doppler, and power Doppler as needed of all accessible portions of each vessel. Bilateral testing is considered an integral part of a complete examination. Limited examinations for reoccurring indications may be performed as noted. The reflux portion of the exam is performed with the  patient in reverse Trendelenburg.  +---------+---------------+---------+-----------+----------+--------------+ RIGHT    CompressibilityPhasicitySpontaneityPropertiesThrombus Aging +---------+---------------+---------+-----------+----------+--------------+ CFV      Full           Yes      Yes                                 +---------+---------------+---------+-----------+----------+--------------+ SFJ      Full                                                        +---------+---------------+---------+-----------+----------+--------------+ FV Prox  Full                                                        +---------+---------------+---------+-----------+----------+--------------+ FV Mid   Full                                                        +---------+---------------+---------+-----------+----------+--------------+ FV DistalFull                                                        +---------+---------------+---------+-----------+----------+--------------+ PFV      Full                                                        +---------+---------------+---------+-----------+----------+--------------+ POP      Full  Yes      Yes                                 +---------+---------------+---------+-----------+----------+--------------+ PTV      Full                                                        +---------+---------------+---------+-----------+----------+--------------+ PERO     Full                                                        +---------+---------------+---------+-----------+----------+--------------+   +---------+---------------+---------+-----------+----------+--------------+ LEFT     CompressibilityPhasicitySpontaneityPropertiesThrombus Aging +---------+---------------+---------+-----------+----------+--------------+ CFV      Full           Yes      Yes                                  +---------+---------------+---------+-----------+----------+--------------+ SFJ      Full                                                        +---------+---------------+---------+-----------+----------+--------------+ FV Prox  Full                                                        +---------+---------------+---------+-----------+----------+--------------+ FV Mid   Full                                                        +---------+---------------+---------+-----------+----------+--------------+ FV DistalFull                                                        +---------+---------------+---------+-----------+----------+--------------+ PFV      Full                                                        +---------+---------------+---------+-----------+----------+--------------+ POP      Full           Yes      Yes                                 +---------+---------------+---------+-----------+----------+--------------+ PTV  Full                                                        +---------+---------------+---------+-----------+----------+--------------+ PERO     Full                                                        +---------+---------------+---------+-----------+----------+--------------+     Summary: BILATERAL: - No evidence of deep vein thrombosis seen in the lower extremities, bilaterally. -No evidence of popliteal cyst, bilaterally.   *See table(s) above for measurements and observations. Electronically signed by Norman Serve on 10/06/2023 at 6:28:05 PM.    Final    ECHOCARDIOGRAM COMPLETE Result Date: 10/06/2023    ECHOCARDIOGRAM REPORT   Patient Name:   IHSAN NOMURA Date of Exam: 10/06/2023 Medical Rec #:  [id number]         Height:       65.0 in Accession #:    [id number]        Weight:       204.1 lb Date of Birth:  04-02-1966         BSA:          1.995 m Patient Age:    57 years          BP:           136/78 mmHg Patient  Gender: M                 HR:           85 bpm. Exam Location:  Inpatient Procedure: 2D Echo, Cardiac Doppler, Color Doppler and Intracardiac            Opacification Agent (Both Spectral and Color Flow Doppler were            utilized during procedure). Indications:    Congestive Heart Failure I50.9  History:        Patient has prior history of Echocardiogram examinations, most                 recent 09/11/2023. Aortic Valve Disease; Risk Factors:Hypertension                 and Diabetes.                 Aortic Valve: 23 mm INSPIRIS RESILIA AORTIC pericardial valve is                 present in the aortic position. Procedure Date: 09/22/23.  Sonographer:    Jayson Gaskins Referring Phys: [id number] [place] IMPRESSIONS  1. Cannot exclude hypokinesis of inferior wall, even with use of echo contrast. Significant LV dyssynchrony noted. Left ventricular ejection fraction, by estimation, is 45 to 50%. The left ventricle has mildly decreased function. The left ventricle demonstrates global hypokinesis. Left ventricular diastolic parameters are indeterminate.  2. Right ventricular systolic function is mildly reduced. The right ventricular size is normal.  3. Left atrial size was mildly dilated.  4. The mitral valve is grossly normal. Trivial mitral valve regurgitation. No evidence of mitral stenosis.  5. The aortic valve has been repaired/replaced. Aortic valve regurgitation is not  visualized. There is a 23 mm INSPIRIS RESILIA AORTIC pericardial valve present in the aortic position. Procedure Date: 09/22/23. Echo findings are consistent with normal structure and function of the aortic valve prosthesis. Aortic valve mean gradient measures 8.0 mmHg. Comparison(s): Changes from prior study are noted. Conclusion(s)/Recommendation(s): S/P surgical AVR since last echo. EF improved from prior 20-25% to 45-50% with LV dyssynchrony. FINDINGS  Left Ventricle: Cannot exclude hypokinesis of inferior wall, even with use of echo contrast.  Significant LV dyssynchrony noted. Left ventricular ejection fraction, by estimation, is 45 to 50%. The left ventricle has mildly decreased function. The left ventricle demonstrates global hypokinesis. Definity  contrast agent was given IV to delineate the left ventricular endocardial borders. The left ventricular internal cavity size was normal in size. There is borderline left ventricular hypertrophy. Abnormal (paradoxical) septal motion, consistent with left bundle branch block. Left ventricular diastolic parameters are indeterminate. Right Ventricle: The right ventricular size is normal. Right vetricular wall thickness was not well visualized. Right ventricular systolic function is mildly reduced. Left Atrium: Left atrial size was mildly dilated. Right Atrium: Right atrial size was normal in size. Pericardium: There is no evidence of pericardial effusion. Mitral Valve: The mitral valve is grossly normal. Trivial mitral valve regurgitation. No evidence of mitral valve stenosis. Tricuspid Valve: The tricuspid valve is not well visualized. Tricuspid valve regurgitation is trivial. No evidence of tricuspid stenosis. Aortic Valve: The aortic valve has been repaired/replaced. Aortic valve regurgitation is not visualized. Aortic valve mean gradient measures 8.0 mmHg. Aortic valve peak gradient measures 13.8 mmHg. Aortic valve area, by VTI measures 2.39 cm. There is a 23 mm INSPIRIS RESILIA AORTIC pericardial valve present in the aortic position. Procedure Date: 09/22/23. Echo findings are consistent with normal structure and function of the aortic valve prosthesis. Pulmonic Valve: The pulmonic valve was not well visualized. Pulmonic valve regurgitation is not visualized. No evidence of pulmonic stenosis. Aorta: The aortic root is normal in size and structure, the ascending aorta was not well visualized and the aortic arch was not well visualized. Venous: The inferior vena cava was not well visualized. IAS/Shunts: The  atrial septum is grossly normal.  LEFT VENTRICLE PLAX 2D LVIDd:         4.40 cm   Diastology LVIDs:         3.00 cm   LV e' medial:    5.87 cm/s LV PW:         1.10 cm   LV E/e' medial:  21.3 LV IVS:        0.90 cm   LV e' lateral:   9.46 cm/s LVOT diam:     1.90 cm   LV E/e' lateral: 13.2 LV SV:         81 LV SV Index:   40 LVOT Area:     2.84 cm  RIGHT VENTRICLE RV S prime:     9.03 cm/s TAPSE (M-mode): 1.2 cm LEFT ATRIUM             Index        RIGHT ATRIUM           Index LA Vol (A2C):   30.9 ml 15.49 ml/m  RA Area:     15.35 cm LA Vol (A4C):   67.5 ml 33.83 ml/m  RA Volume:   37.90 ml  18.99 ml/m LA Biplane Vol: 47.2 ml 23.66 ml/m  AORTIC VALVE AV Area (Vmax):    2.32 cm AV Area (Vmean):   2.30 cm AV  Area (VTI):     2.39 cm AV Vmax:           186.00 cm/s AV Vmean:          139.000 cm/s AV VTI:            0.337 m AV Peak Grad:      13.8 mmHg AV Mean Grad:      8.0 mmHg LVOT Vmax:         152.00 cm/s LVOT Vmean:        113.000 cm/s LVOT VTI:          0.284 m LVOT/AV VTI ratio: 0.84  AORTA Ao Root diam: 3.20 cm MITRAL VALVE MV Area (PHT): 3.40 cm     SHUNTS MV Decel Time: 223 msec     Systemic VTI:  0.28 m MV E velocity: 125.00 cm/s  Systemic Diam: 1.90 cm MV A velocity: 94.90 cm/s MV E/A ratio:  1.32 Shelda Bruckner MD Electronically signed by Shelda Bruckner MD Signature Date/Time: 10/06/2023/4:38:23 PM    Final     Cardiac Studies   See echo above.   Patient Profile     58 y.o. male  with history of HFrEF, HTN, T2DM, CKD 2, and aortic stenosis s/p bioprothestic AVR with Dr. Kerrin on 09/22/23.     Assessment & Plan     Acute on chronic systolic CHF: Nonischemic cardiomyopathy:  EF 20 - 25% non ischemic improved to 45 - 50% post op repeated yesterday.  On PO Torsemide  and spironolactone .  Low dose beta blocker.  There was a mention of starting Entresto  yesterday but not on med list.  Will start with low dose ARB and titrate to Entresto  as tolerated. .    Net negative 3.9  liters.  Good UO yesterday.   Creat has trended down.   Otherwise continue meds as listed.    Aortic stenosis: S/p bioprosthetic AVR in 6/25.   Stable AVR on echo yesterday.  Has been intolerant to GLP1ra with dizziness in the past.    LBBB   CKD stage 3: Improved.  Follow.    For questions or updates, please contact [facility] Please consult [email or web address] for contact info under Cardiology/STEMI.   Signed, Lynwood Schilling, MD  10/08/2023, 10:15 AM

## 2023-10-08 NOTE — Progress Notes (Signed)
 Pt's blood sugar was 122 but is not uploading in the system

## 2023-10-08 NOTE — Progress Notes (Addendum)
 Subjective: Says he feels better  Objective: Vital signs in last 24 hours: Temp:  [97.8 F (36.6 C)-98 F (36.7 C)] 98 F (36.7 C) (07/04 2155) Pulse Rate:  [83-95] 87 (07/04 2155) Cardiac Rhythm: Normal sinus rhythm;Bundle branch block (07/04 1913) Resp:  [13-20] 19 (07/04 2155) BP: (105-125)/(69-91) 118/78 (07/04 2155) SpO2:  [93 %-99 %] 93 % (07/04 2155) Weight:  [92.3 kg] 92.3 kg (07/05 0411)  Hemodynamic parameters for last 24 hours:    Intake/Output from previous day: 07/04 0701 - 07/05 0700 In: 120 [P.O.:120] Out: 1600 [Urine:1600] Intake/Output this shift: No intake/output data recorded.  General appearance: alert, cooperative, and no distress Heart: regular rate and rhythm Lungs: clear to auscultation bilaterally Abdomen: benign Extremities: edema improved but still significant in LE's Wound: erethema assoc w/ blisters, some blisters have opened, + foul smell, dressing changed  Lab Results: Recent Labs    10/05/23 2003 10/06/23 0320  WBC 15.1* 12.7*  HGB 12.4* 11.6*  HCT 37.2* 35.1*  PLT 424* 411*   BMET:  Recent Labs    10/07/23 0306 10/08/23 0300  NA 134* 136  K 3.7 3.8  CL 97* 99  CO2 31 32  GLUCOSE 86 86  BUN 34* 32*  CREATININE 1.03 1.13  CALCIUM  8.2* 8.5*    PT/INR: No results for input(s): LABPROT, INR in the last 72 hours. ABG    Component Value Date/Time   PHART 7.260 (L) 09/22/2023 1758   HCO3 20.2 09/22/2023 1758   TCO2 22 09/22/2023 1758   ACIDBASEDEF 7.0 (H) 09/22/2023 1758   O2SAT 91 09/22/2023 1758   CBG (last 3)  Recent Labs    10/06/23 0755 10/06/23 1136 10/06/23 1219  GLUCAP 246* 59* 85    Meds Scheduled Meds:  aspirin  EC  325 mg Oral Daily   atorvastatin   40 mg Oral QHS   docusate sodium   100 mg Oral BID   enoxaparin  (LOVENOX ) injection  40 mg Subcutaneous Q24H   gabapentin   100 mg Oral BID   insulin  aspart  0-5 Units Subcutaneous QHS   insulin  aspart  0-9 Units Subcutaneous TID WC   insulin   aspart  18 Units Subcutaneous TID WC   insulin  glargine-yfgn  24 Units Subcutaneous BID   metoprolol  succinate  25 mg Oral Daily   potassium chloride   20 mEq Oral TID   sodium chloride  flush  3 mL Intravenous Q12H   spironolactone   25 mg Oral Daily   torsemide   40 mg Oral Daily   Continuous Infusions: PRN Meds:.acetaminophen  **OR** acetaminophen , ondansetron  (ZOFRAN ) IV, oxyCODONE   Xrays VAS US  LOWER EXTREMITY VENOUS (DVT) Result Date: 10/06/2023  Lower Venous DVT Study Patient Name:  HONDO NANDA  Date of Exam:   10/06/2023 Medical Rec #: [id number]          Accession #:    [id number] Date of Birth: 1965-11-17          Patient Gender: M Patient Age:   58 years Exam Location:  [facility] Procedure:      VAS US  LOWER EXTREMITY VENOUS (DVT) Referring Phys: CON CHAMBERS --------------------------------------------------------------------------------  Indications: Swelling, and Edema. Other Indications: Status post Aortic Valve Repair 09/22/23. Performing Technologist: Ricka Sturdivant-Jones RDMS, RVT  Examination Guidelines: A complete evaluation includes B-mode imaging, spectral Doppler, color Doppler, and power Doppler as needed of all accessible portions of each vessel. Bilateral testing is considered an integral part of a complete examination. Limited examinations for reoccurring indications may be performed as noted. The reflux portion  of the exam is performed with the patient in reverse Trendelenburg.  +---------+---------------+---------+-----------+----------+--------------+ RIGHT    CompressibilityPhasicitySpontaneityPropertiesThrombus Aging +---------+---------------+---------+-----------+----------+--------------+ CFV      Full           Yes      Yes                                 +---------+---------------+---------+-----------+----------+--------------+ SFJ      Full                                                         +---------+---------------+---------+-----------+----------+--------------+ FV Prox  Full                                                        +---------+---------------+---------+-----------+----------+--------------+ FV Mid   Full                                                        +---------+---------------+---------+-----------+----------+--------------+ FV DistalFull                                                        +---------+---------------+---------+-----------+----------+--------------+ PFV      Full                                                        +---------+---------------+---------+-----------+----------+--------------+ POP      Full           Yes      Yes                                 +---------+---------------+---------+-----------+----------+--------------+ PTV      Full                                                        +---------+---------------+---------+-----------+----------+--------------+ PERO     Full                                                        +---------+---------------+---------+-----------+----------+--------------+   +---------+---------------+---------+-----------+----------+--------------+ LEFT     CompressibilityPhasicitySpontaneityPropertiesThrombus Aging +---------+---------------+---------+-----------+----------+--------------+ CFV      Full           Yes      Yes                                 +---------+---------------+---------+-----------+----------+--------------+  SFJ      Full                                                        +---------+---------------+---------+-----------+----------+--------------+ FV Prox  Full                                                        +---------+---------------+---------+-----------+----------+--------------+ FV Mid   Full                                                         +---------+---------------+---------+-----------+----------+--------------+ FV DistalFull                                                        +---------+---------------+---------+-----------+----------+--------------+ PFV      Full                                                        +---------+---------------+---------+-----------+----------+--------------+ POP      Full           Yes      Yes                                 +---------+---------------+---------+-----------+----------+--------------+ PTV      Full                                                        +---------+---------------+---------+-----------+----------+--------------+ PERO     Full                                                        +---------+---------------+---------+-----------+----------+--------------+     Summary: BILATERAL: - No evidence of deep vein thrombosis seen in the lower extremities, bilaterally. -No evidence of popliteal cyst, bilaterally.   *See table(s) above for measurements and observations. Electronically signed by Norman Serve on 10/06/2023 at 6:28:05 PM.    Final    ECHOCARDIOGRAM COMPLETE Result Date: 10/06/2023    ECHOCARDIOGRAM REPORT   Patient Name:   SAYER MASINI Date of Exam: 10/06/2023 Medical Rec #:  [id number]         Height:       65.0 in Accession #:    [id number]        Weight:  204.1 lb Date of Birth:  12/11/1965         BSA:          1.995 m Patient Age:    57 years          BP:           136/78 mmHg Patient Gender: M                 HR:           85 bpm. Exam Location:  Inpatient Procedure: 2D Echo, Cardiac Doppler, Color Doppler and Intracardiac            Opacification Agent (Both Spectral and Color Flow Doppler were            utilized during procedure). Indications:    Congestive Heart Failure I50.9  History:        Patient has prior history of Echocardiogram examinations, most                 recent 09/11/2023. Aortic Valve Disease; Risk  Factors:Hypertension                 and Diabetes.                 Aortic Valve: 23 mm INSPIRIS RESILIA AORTIC pericardial valve is                 present in the aortic position. Procedure Date: 09/22/23.  Sonographer:    Jayson Gaskins Referring Phys: [id number] [place] IMPRESSIONS  1. Cannot exclude hypokinesis of inferior wall, even with use of echo contrast. Significant LV dyssynchrony noted. Left ventricular ejection fraction, by estimation, is 45 to 50%. The left ventricle has mildly decreased function. The left ventricle demonstrates global hypokinesis. Left ventricular diastolic parameters are indeterminate.  2. Right ventricular systolic function is mildly reduced. The right ventricular size is normal.  3. Left atrial size was mildly dilated.  4. The mitral valve is grossly normal. Trivial mitral valve regurgitation. No evidence of mitral stenosis.  5. The aortic valve has been repaired/replaced. Aortic valve regurgitation is not visualized. There is a 23 mm INSPIRIS RESILIA AORTIC pericardial valve present in the aortic position. Procedure Date: 09/22/23. Echo findings are consistent with normal structure and function of the aortic valve prosthesis. Aortic valve mean gradient measures 8.0 mmHg. Comparison(s): Changes from prior study are noted. Conclusion(s)/Recommendation(s): S/P surgical AVR since last echo. EF improved from prior 20-25% to 45-50% with LV dyssynchrony. FINDINGS  Left Ventricle: Cannot exclude hypokinesis of inferior wall, even with use of echo contrast. Significant LV dyssynchrony noted. Left ventricular ejection fraction, by estimation, is 45 to 50%. The left ventricle has mildly decreased function. The left ventricle demonstrates global hypokinesis. Definity  contrast agent was given IV to delineate the left ventricular endocardial borders. The left ventricular internal cavity size was normal in size. There is borderline left ventricular hypertrophy. Abnormal (paradoxical) septal motion,  consistent with left bundle branch block. Left ventricular diastolic parameters are indeterminate. Right Ventricle: The right ventricular size is normal. Right vetricular wall thickness was not well visualized. Right ventricular systolic function is mildly reduced. Left Atrium: Left atrial size was mildly dilated. Right Atrium: Right atrial size was normal in size. Pericardium: There is no evidence of pericardial effusion. Mitral Valve: The mitral valve is grossly normal. Trivial mitral valve regurgitation. No evidence of mitral valve stenosis. Tricuspid Valve: The tricuspid valve is not well visualized. Tricuspid valve regurgitation is trivial. No evidence  of tricuspid stenosis. Aortic Valve: The aortic valve has been repaired/replaced. Aortic valve regurgitation is not visualized. Aortic valve mean gradient measures 8.0 mmHg. Aortic valve peak gradient measures 13.8 mmHg. Aortic valve area, by VTI measures 2.39 cm. There is a 23 mm INSPIRIS RESILIA AORTIC pericardial valve present in the aortic position. Procedure Date: 09/22/23. Echo findings are consistent with normal structure and function of the aortic valve prosthesis. Pulmonic Valve: The pulmonic valve was not well visualized. Pulmonic valve regurgitation is not visualized. No evidence of pulmonic stenosis. Aorta: The aortic root is normal in size and structure, the ascending aorta was not well visualized and the aortic arch was not well visualized. Venous: The inferior vena cava was not well visualized. IAS/Shunts: The atrial septum is grossly normal.  LEFT VENTRICLE PLAX 2D LVIDd:         4.40 cm   Diastology LVIDs:         3.00 cm   LV e' medial:    5.87 cm/s LV PW:         1.10 cm   LV E/e' medial:  21.3 LV IVS:        0.90 cm   LV e' lateral:   9.46 cm/s LVOT diam:     1.90 cm   LV E/e' lateral: 13.2 LV SV:         81 LV SV Index:   40 LVOT Area:     2.84 cm  RIGHT VENTRICLE RV S prime:     9.03 cm/s TAPSE (M-mode): 1.2 cm LEFT ATRIUM             Index         RIGHT ATRIUM           Index LA Vol (A2C):   30.9 ml 15.49 ml/m  RA Area:     15.35 cm LA Vol (A4C):   67.5 ml 33.83 ml/m  RA Volume:   37.90 ml  18.99 ml/m LA Biplane Vol: 47.2 ml 23.66 ml/m  AORTIC VALVE AV Area (Vmax):    2.32 cm AV Area (Vmean):   2.30 cm AV Area (VTI):     2.39 cm AV Vmax:           186.00 cm/s AV Vmean:          139.000 cm/s AV VTI:            0.337 m AV Peak Grad:      13.8 mmHg AV Mean Grad:      8.0 mmHg LVOT Vmax:         152.00 cm/s LVOT Vmean:        113.000 cm/s LVOT VTI:          0.284 m LVOT/AV VTI ratio: 0.84  AORTA Ao Root diam: 3.20 cm MITRAL VALVE MV Area (PHT): 3.40 cm     SHUNTS MV Decel Time: 223 msec     Systemic VTI:  0.28 m MV E velocity: 125.00 cm/s  Systemic Diam: 1.90 cm MV A velocity: 94.90 cm/s MV E/A ratio:  1.32 Shelda Bruckner MD Electronically signed by Shelda Bruckner MD Signature Date/Time: 10/06/2023/4:38:23 PM    Final     Assessment/Plan:  1 afeb, SR w/LBBB, VSS,some PVC's acute on chronic CHF- Cardiology  team assisting w/ diuresis and GDMT aspects of care- now on po torsemide  daily 2 O2 sats good on RA 3 conts to void well, some unmeasured, weight stable, edema improving 4 cbg's remain variable- not uploading  to the system- will reduce meal time novalog 5 BUN trending towards normal 32, creat in normal range 1.13 6 cont dressing changes for LE's ,blisters, will start ancef  IV, as now some foul odor, ? Purulence, keep legs elevated.  7 pulm hygiene    LOS: 3 days    Lemond FORBES Cera PA-C Pager [phone]  10/08/2023   Chart reviewed, patient examined, agree with above.  His leg swelling is improving. Continue diuresis and keep legs up when not ambulating. He says he walked 6 times yesterday. Some erythema around the open blisters and drainage. Will start Ancef .

## 2023-10-08 NOTE — Progress Notes (Signed)
 CARDIAC REHAB PHASE I    Stopped by to see pt. Post op [facility] education reviewed and completed on 09-27-23. CRP2 referral sent to [facility]. Reviewed this information with pt today per MD request. No questions or concerns. CRP1 will sign off today. Available for further consult if need arise this admission.    Vaughn Asberry Hacking, RN BSN 10/08/2023 9:52 AM

## 2023-10-09 DIAGNOSIS — R600 Localized edema: Secondary | ICD-10-CM

## 2023-10-09 DIAGNOSIS — I447 Left bundle-branch block, unspecified: Secondary | ICD-10-CM

## 2023-10-09 LAB — BASIC METABOLIC PANEL WITH GFR
Anion gap: 8 (ref 5–15)
BUN: 27 mg/dL — ABNORMAL HIGH (ref 6–20)
CO2: 32 mmol/L (ref 22–32)
Calcium: 8.9 mg/dL (ref 8.9–10.3)
Chloride: 99 mmol/L (ref 98–111)
Creatinine, Ser: 1.17 mg/dL (ref 0.61–1.24)
GFR, Estimated: >60 mL/min (ref >60)
Glucose, Bld: 143 mg/dL — ABNORMAL HIGH (ref 70–99)
Potassium: 4.5 mmol/L (ref 3.5–5.1)
Sodium: 139 mmol/L (ref 135–145)

## 2023-10-09 LAB — CBC
HCT: 33.5 % — ABNORMAL LOW (ref 39.0–52.0)
Hemoglobin: 11.2 g/dL — ABNORMAL LOW (ref 13.0–17.0)
MCH: 28.5 pg (ref 26.0–34.0)
MCHC: 33.4 g/dL (ref 30.0–36.0)
MCV: 85.2 fL (ref 80.0–100.0)
Platelets: 337 K/uL (ref 150–400)
RBC: 3.93 MIL/uL — ABNORMAL LOW (ref 4.22–5.81)
RDW: 12.8 % (ref 11.5–15.5)
WBC: 11.3 K/uL — ABNORMAL HIGH (ref 4.0–10.5)
nRBC: 0 % (ref 0.0–0.2)

## 2023-10-09 MED ORDER — INSULIN ASPART 100 UNIT/ML IJ SOLN
5.0000 [IU] | Freq: Three times a day (TID) | INTRAMUSCULAR | Status: DC
  Administered 2023-10-09 – 2023-10-11 (×9): 5 [IU] via SUBCUTANEOUS

## 2023-10-09 MED ORDER — POTASSIUM CHLORIDE CRYS ER 20 MEQ PO TBCR
20.0000 meq | EXTENDED_RELEASE_TABLET | Freq: Every day | ORAL | Status: DC
  Administered 2023-10-09 – 2023-10-15 (×7): 20 meq via ORAL
  Filled 2023-10-09 (×8): qty 1

## 2023-10-09 NOTE — Progress Notes (Signed)
Cbg 216

## 2023-10-09 NOTE — Progress Notes (Addendum)
  Subjective: Feels ok, no specific c/o. Wants to go home  Objective: Vital signs in last 24 hours: Temp:  [97.6 F (36.4 C)-98.2 F (36.8 C)] 97.8 F (36.6 C) (07/06 0226) Pulse Rate:  [79-91] 85 (07/06 0226) Cardiac Rhythm: Normal sinus rhythm (07/05 1900) Resp:  [14-18] 17 (07/06 0500) BP: (110-125)/(67-79) 123/70 (07/06 0226) SpO2:  [95 %-99 %] 98 % (07/06 0226) Weight:  [91 kg] 91 kg (07/06 0500)  Hemodynamic parameters for last 24 hours:    Intake/Output from previous day: 07/05 0701 - 07/06 0700 In: 120 [P.O.:120] Out: 2325 [Urine:2325] Intake/Output this shift: No intake/output data recorded.  General appearance: alert, cooperative, and no distress Heart: regular rate and rhythm Lungs: clear to auscultation bilaterally Abdomen: benign Extremities: edema appears stable Wound: wounds/ blistering is stable, some purulent drainage  Lab Results: Recent Labs    10/09/23 0417  WBC 11.3*  HGB 11.2*  HCT 33.5*  PLT 337   BMET:  Recent Labs    10/08/23 0300 10/09/23 0417  NA 136 139  K 3.8 4.5  CL 99 99  CO2 32 32  GLUCOSE 86 143*  BUN 32* 27*  CREATININE 1.13 1.17  CALCIUM  8.5* 8.9    PT/INR: No results for input(s): LABPROT, INR in the last 72 hours. ABG    Component Value Date/Time   PHART 7.260 (L) 09/22/2023 1758   HCO3 20.2 09/22/2023 1758   TCO2 22 09/22/2023 1758   ACIDBASEDEF 7.0 (H) 09/22/2023 1758   O2SAT 91 09/22/2023 1758   CBG (last 3)  Recent Labs    10/06/23 0755 10/06/23 1136 10/06/23 1219  GLUCAP 246* 59* 85    Meds Scheduled Meds:  aspirin  EC  325 mg Oral Daily   atorvastatin   40 mg Oral QHS   docusate sodium   100 mg Oral BID   enoxaparin  (LOVENOX ) injection  40 mg Subcutaneous Q24H   gabapentin   100 mg Oral BID   insulin  aspart  0-5 Units Subcutaneous QHS   insulin  aspart  0-9 Units Subcutaneous TID WC   insulin  aspart  10 Units Subcutaneous TID WC   insulin  glargine-yfgn  24 Units Subcutaneous BID    losartan   12.5 mg Oral Daily   metoprolol  succinate  25 mg Oral Daily   sodium chloride  flush  3 mL Intravenous Q12H   spironolactone   25 mg Oral Daily   torsemide   40 mg Oral Daily   Continuous Infusions:   ceFAZolin  (ANCEF ) IV 1 g (10/09/23 0606)   PRN Meds:.acetaminophen  **OR** acetaminophen , ondansetron  (ZOFRAN ) IV, oxyCODONE   Xrays No results found.  Assessment/Plan:  1 afeb, VSS, Sinus rhythm, LBBB, cards assisting w/ GDMT, currently on daily 40 torsemide , ARB added yesterday , metoprolol  25 BID, asa 325, atorvastatin  40 2 O2 sats good on RA 3 BS look better control - nurses only able to document in notes- some on lower end, will reduce meal coverage a little further 4 creat fairly stable at 1.17- h/o CKD 3 5 leukocytosis trending lower, now on ancef  for LE blisters, early cellulitis/purulent drainage- cont dressing changes 6 HGB pretty stable at 11.3 7  cont to mobilize/ keep legs elevated, routine pulm hygiene    LOS: 4 days    Lemond FORBES Cera PA-C Pager [phone] 10/09/2023   Chart reviewed, patient examined, agree with above.  Legs continue to improve with diuresis and elevation. He is ambulating well. Continue antibiotic for early cellulitis.

## 2023-10-09 NOTE — Progress Notes (Signed)
BS 122

## 2023-10-09 NOTE — Progress Notes (Signed)
CBG 245. 

## 2023-10-10 DIAGNOSIS — Z952 Presence of prosthetic heart valve: Secondary | ICD-10-CM | POA: Diagnosis not present

## 2023-10-10 LAB — GLUCOSE, CAPILLARY
Glucose-Capillary: 76 mg/dL (ref 70–99)
Glucose-Capillary: 175 mg/dL — ABNORMAL HIGH (ref 70–99)
Glucose-Capillary: 113 mg/dL — ABNORMAL HIGH (ref 70–99)
Glucose-Capillary: 83 mg/dL (ref 70–99)
Glucose-Capillary: 164 mg/dL — ABNORMAL HIGH (ref 70–99)
Glucose-Capillary: 119 mg/dL — ABNORMAL HIGH (ref 70–99)
Glucose-Capillary: 141 mg/dL — ABNORMAL HIGH (ref 70–99)
Glucose-Capillary: 69 mg/dL — ABNORMAL LOW (ref 70–99)
Glucose-Capillary: 143 mg/dL — ABNORMAL HIGH (ref 70–99)
Glucose-Capillary: 66 mg/dL — ABNORMAL LOW (ref 70–99)
Glucose-Capillary: 221 mg/dL — ABNORMAL HIGH (ref 70–99)
Glucose-Capillary: 71 mg/dL (ref 70–99)
Glucose-Capillary: 112 mg/dL — ABNORMAL HIGH (ref 70–99)
Glucose-Capillary: 166 mg/dL — ABNORMAL HIGH (ref 70–99)
Glucose-Capillary: 134 mg/dL — ABNORMAL HIGH (ref 70–99)
Glucose-Capillary: 114 mg/dL — ABNORMAL HIGH (ref 70–99)
Glucose-Capillary: 122 mg/dL — ABNORMAL HIGH (ref 70–99)
Glucose-Capillary: 146 mg/dL — ABNORMAL HIGH (ref 70–99)
Glucose-Capillary: 216 mg/dL — ABNORMAL HIGH (ref 70–99)
Glucose-Capillary: 245 mg/dL — ABNORMAL HIGH (ref 70–99)
Glucose-Capillary: 118 mg/dL — ABNORMAL HIGH (ref 70–99)
Glucose-Capillary: 156 mg/dL — ABNORMAL HIGH (ref 70–99)

## 2023-10-10 LAB — BASIC METABOLIC PANEL WITH GFR
Anion gap: 8 (ref 5–15)
BUN: 25 mg/dL — ABNORMAL HIGH (ref 6–20)
CO2: 33 mmol/L — ABNORMAL HIGH (ref 22–32)
Calcium: 9 mg/dL (ref 8.9–10.3)
Chloride: 98 mmol/L (ref 98–111)
Creatinine, Ser: 1.13 mg/dL (ref 0.61–1.24)
GFR, Estimated: >60 mL/min (ref >60)
Glucose, Bld: 170 mg/dL — ABNORMAL HIGH (ref 70–99)
Potassium: 5 mmol/L (ref 3.5–5.1)
Sodium: 139 mmol/L (ref 135–145)

## 2023-10-10 MED ORDER — LOSARTAN POTASSIUM 25 MG PO TABS: 25.0000 mg | ORAL_TABLET | Freq: Every day | ORAL | Status: DC

## 2023-10-10 MED ORDER — SACUBITRIL-VALSARTAN 24-26 MG PO TABS
1.0000 | ORAL_TABLET | Freq: Two times a day (BID) | ORAL | Status: DC
  Administered 2023-10-10 – 2023-10-12 (×6): 1 via ORAL
  Filled 2023-10-10 (×6): qty 1

## 2023-10-10 NOTE — Progress Notes (Signed)
 CBG 118

## 2023-10-10 NOTE — Progress Notes (Addendum)
 Advanced Heart Failure Rounding Note  Cardiologist: Madonna Large, DO  Chief Complaint: Heart Failure  Subjective:   7/2: Diuresed with IV lasix .   Now on PO diuretics. Renal function stable. SBP 110s.   Feels good. Wife at the bedside. Has been getting up and ambulating in the halls 3-4x/day.   Objective:   Weight Range: 89.7 kg Body mass index is 32.9 kg/m.   Vital Signs:   Temp:  [97.6 F (36.4 C)-98.2 F (36.8 C)] 97.7 F (36.5 C) (07/07 0129) Pulse Rate:  [86-92] 92 (07/07 0129) Resp:  [13-25] 16 (07/07 0129) BP: (108-120)/(66-77) 114/67 (07/07 0129) SpO2:  [95 %-98 %] 98 % (07/07 0129) Weight:  [89.7 kg] 89.7 kg (07/07 0251) Last BM Date : 10/08/23  Weight change: Filed Weights   10/08/23 0411 10/09/23 0500 10/10/23 0251  Weight: 92.3 kg 91 kg 89.7 kg    Intake/Output:   Intake/Output Summary (Last 24 hours) at 10/10/2023 0801 Last data filed at 10/10/2023 0158 Gross per 24 hour  Intake 300 ml  Output 1700 ml  Net -1400 ml      Physical Exam    General:  well appearing.  No respiratory difficulty Neck: supple. JVD ~7 cm.  Cor: PMI nondisplaced. Regular rate & rhythm. No rubs, gallops or murmurs. Lungs: diminished, worse in the bases Extremities: Trace BLE edema. BLE weeping wounds.  Neuro: alert & oriented x 3. Moves all 4 extremities w/o difficulty. Affect pleasant.   Telemetry   NSR 90s-low 100s intt PVCs (Personally reviewed)    EKG    N/A  Labs    CBC Recent Labs    10/09/23 0417  WBC 11.3*  HGB 11.2*  HCT 33.5*  MCV 85.2  PLT 337   Basic Metabolic Panel Recent Labs    92/94/74 0300 10/09/23 0417  NA 136 139  K 3.8 4.5  CL 99 99  CO2 32 32  GLUCOSE 86 143*  BUN 32* 27*  CREATININE 1.13 1.17  CALCIUM  8.5* 8.9   Liver Function Tests No results for input(s): AST, ALT, ALKPHOS, BILITOT, PROT, ALBUMIN  in the last 72 hours.  No results for input(s): LIPASE, AMYLASE in the last 72 hours. Cardiac  Enzymes No results for input(s): CKTOTAL, CKMB, CKMBINDEX, TROPONINI in the last 72 hours.  BNP: BNP (last 3 results) Recent Labs    09/10/23 2142 09/16/23 1722 10/05/23 2003  BNP 440.4* 376.4* 459.1*    ProBNP (last 3 results) Recent Labs    06/06/23 1633 06/28/23 1402 07/27/23 1314  PROBNP 1,093* 1,172* 1,063*     D-Dimer No results for input(s): DDIMER in the last 72 hours. Hemoglobin A1C No results for input(s): HGBA1C in the last 72 hours. Fasting Lipid Panel No results for input(s): CHOL, HDL, LDLCALC, TRIG, CHOLHDL, LDLDIRECT in the last 72 hours. Thyroid Function Tests No results for input(s): TSH, T4TOTAL, T3FREE, THYROIDAB in the last 72 hours.  Invalid input(s): FREET3  Other results:   Imaging    No results found.    Medications:     Scheduled Medications:  aspirin  EC  325 mg Oral Daily   atorvastatin   40 mg Oral QHS   docusate sodium   100 mg Oral BID   enoxaparin  (LOVENOX ) injection  40 mg Subcutaneous Q24H   gabapentin   100 mg Oral BID   insulin  aspart  0-5 Units Subcutaneous QHS   insulin  aspart  0-9 Units Subcutaneous TID WC   insulin  aspart  5 Units Subcutaneous TID WC  insulin  glargine-yfgn  24 Units Subcutaneous BID   losartan   12.5 mg Oral Daily   metoprolol  succinate  25 mg Oral Daily   potassium chloride   20 mEq Oral Daily   sodium chloride  flush  3 mL Intravenous Q12H   spironolactone   25 mg Oral Daily   torsemide   40 mg Oral Daily    Infusions:   ceFAZolin  (ANCEF ) IV 1 g (10/10/23 0635)    PRN Medications: acetaminophen  **OR** acetaminophen , ondansetron  (ZOFRAN ) IV, oxyCODONE     Patient Profile   58 y.o. male with history of HFrEF, HTN, T2DM, CKD 2, and aortic stenosis s/p bioprothestic AVR with Dr. Kerrin on 09/22/23.   Assessment/Plan  1. Acute on chronic systolic CHF: Nonischemic cardiomyopathy.  ?Valvular related to severe AS versus LBBB cardiomyopathy.  Cath in pre-AVR  showed no significant CAD but markedly elevated filling pressures.  Echo in 6/25 (pre-op) showed EF 20-25% with septal-lateral dyssynchrony, normal RV function, severe aortic stenosis.  Patient is now s/p bioprosthetic AVR.  Intra-op TEE reported EF 50% but no post-op echo was done.  - Echo 10/06/23 EF 45-50%, significant LV dyssynchrony, LV with GHK, RV mildly reduced, LA mildly dilated, trivial MR, normal structure and function of AV prosthesis - Volume status stable. Continue Torsemide  40 mg daily.   - Continue spiro 25 mg daily  - Continue Toprol  XL 25 mg daily - Increase losartan  12.5>25 mg daily. Plan for Entresto  - Allergy to farxiga  and invokana- syncope/dizziness.  - Renal function stable.  - Would benefit from CRT-D give LBBB if EF remains low a few months after AVR and medical optimization.   2. Aortic stenosis: S/p bioprosthetic AVR in 6/25.  - Normal structure and function of AV prosthesis. AV mean gradient 8 mmHG  3. CKD stage 3: Creatinine baseline ~ 1.5-1.7 Stable.   4. Venous stasis ulcers: R and LLE wounds. WOC consulted.  - Continues on cefazolin   Length of Stay: 5  Beckey LITTIE Coe, NP  10/10/2023, 8:01 AM  Advanced Heart Failure Team Pager [phone] (M-F; 7a - 5p)  Please contact [facility] for night-coverage after hours (5p -7a ) and weekends on [email or web address]  Patient seen with NP, I formulated the plan and agree with the above note.   Feels good, has been walking in hall without dyspnea.   General: NAD Neck: JVP 8 cm, no thyromegaly or thyroid nodule.  Lungs: Clear to auscultation bilaterally with normal respiratory effort. CV: Nondisplaced PMI.  Heart regular S1/S2, no S3/S4, 1/6 SEM RUSB.  1+ ankle edema.   Abdomen: Soft, nontender, no hepatosplenomegaly, no distention.  Skin: Ulcerations on lower legs.  Neurologic: Alert and oriented x 3.  Psych: Normal affect. Extremities: No clubbing or cyanosis.  HEENT: Normal.   Patient is stable from cardiac  standpoint, post-op echo with EF 45-50% and mild RV dysfunction, normally functioning bioprosthetic aortic valve.  Probably very mild volume overload on exam.  - Would get BMET today to follow creatinine and K with current meds.  - Continue torsemide  40 mg daily.  - Can stop losartan  and use Entresto  24/26 bid.  - Continue spironolactone  and Toprol  XL.   He is on cefazolin  for venous stasis ulcerations and possible cellulitis lower legs.  Per primary team.   From cardiac standpoint, I think he is ready for home.   Ezra Shuck 10/10/2023 9:36 AM

## 2023-10-10 NOTE — Progress Notes (Addendum)
  Subjective: Feels ok, legs cont to feel better  Objective: Vital signs in last 24 hours: Temp:  [97.6 F (36.4 C)-98.2 F (36.8 C)] 97.7 F (36.5 C) (07/07 0129) Pulse Rate:  [86-92] 92 (07/07 0129) Cardiac Rhythm: Normal sinus rhythm (07/06 2230) Resp:  [13-25] 16 (07/07 0129) BP: (108-120)/(66-77) 114/67 (07/07 0129) SpO2:  [95 %-98 %] 98 % (07/07 0129) Weight:  [89.7 kg] 89.7 kg (07/07 0251)  Hemodynamic parameters for last 24 hours:    Intake/Output from previous day: 07/06 0701 - 07/07 0700 In: 300 [IV Piggyback:300] Out: 2000 [Urine:2000] Intake/Output this shift: No intake/output data recorded.  General appearance: alert, cooperative, and no distress Heart: regular rate and rhythm Lungs: clear to auscultation bilaterally Abdomen: benign Extremities: edema improved Wound: blisters stable, erethema improving  Lab Results: Recent Labs    10/09/23 0417  WBC 11.3*  HGB 11.2*  HCT 33.5*  PLT 337   BMET:  Recent Labs    10/08/23 0300 10/09/23 0417  NA 136 139  K 3.8 4.5  CL 99 99  CO2 32 32  GLUCOSE 86 143*  BUN 32* 27*  CREATININE 1.13 1.17  CALCIUM  8.5* 8.9    PT/INR: No results for input(s): LABPROT, INR in the last 72 hours. ABG    Component Value Date/Time   PHART 7.260 (L) 09/22/2023 1758   HCO3 20.2 09/22/2023 1758   TCO2 22 09/22/2023 1758   ACIDBASEDEF 7.0 (H) 09/22/2023 1758   O2SAT 91 09/22/2023 1758   CBG (last 3)  No results for input(s): GLUCAP in the last 72 hours.  Meds Scheduled Meds:  aspirin  EC  325 mg Oral Daily   atorvastatin   40 mg Oral QHS   docusate sodium   100 mg Oral BID   enoxaparin  (LOVENOX ) injection  40 mg Subcutaneous Q24H   gabapentin   100 mg Oral BID   insulin  aspart  0-5 Units Subcutaneous QHS   insulin  aspart  0-9 Units Subcutaneous TID WC   insulin  aspart  5 Units Subcutaneous TID WC   insulin  glargine-yfgn  24 Units Subcutaneous BID   losartan   12.5 mg Oral Daily   metoprolol  succinate  25  mg Oral Daily   potassium chloride   20 mEq Oral Daily   sodium chloride  flush  3 mL Intravenous Q12H   spironolactone   25 mg Oral Daily   torsemide   40 mg Oral Daily   Continuous Infusions:   ceFAZolin  (ANCEF ) IV 1 g (10/10/23 0635)   PRN Meds:.acetaminophen  **OR** acetaminophen , ondansetron  (ZOFRAN ) IV, oxyCODONE   Xrays No results found.  Assessment/Plan:  1 afeb, VSS, sinus rhythm, LBBB 2 O2 sats good on RA 3 voiding well, not all measured, >2000 ml recorded 4 no new labs or xrays 5 CBG's some readings in 200's 6 cont IV ancef  , will need to determine timing to transition to po  7 cont dressing changes , leg elevation  LOS: 5 days    Lemond FORBES Salon Pager [phone] 10/10/2023 Patient seen and examined, agree with findings and plan outlined above Continue IV antibiotics for cellulitis Edema improved but still significant  Elspeth C. Kerrin, MD [facility] [phone]

## 2023-10-10 NOTE — Plan of Care (Signed)
   Problem: Health Behavior/Discharge Planning: Goal: Ability to manage health-related needs will improve Outcome: Progressing   Problem: Clinical Measurements: Goal: Will remain free from infection Outcome: Progressing

## 2023-10-10 NOTE — Progress Notes (Signed)
 Occupational Therapy Treatment Patient Details Name: Jonathon Bray MRN: [id number] DOB: 1965/06/09 Today's Date: 10/10/2023   History of present illness 58 y/o male adm 10/05/23 with LB edema and venous stasis ulcers. PMHx: 6/19 AVR with sternotomy and D/C 6/25. CHF, CKD, obesity, T2DM, HTN, kyphoplasty   OT comments  Pt making incremental progress towards OT goals. Pt reports legs feeling much better, has been mobilizing to/from bathroom without assist. Pt continues to require Min A for LB dressing aspects due to deficits in flexibility and LE edema. Pt's wife at bedside, able to provide assist as needed at home. Pt left to mobilize with spouse in hallway using RW with Modified Independence. Anticipate no OT needs upon DC but will follow distantly while admitted.      If plan is discharge home, recommend the following:  Assistance with cooking/housework;Assist for transportation;Help with stairs or ramp for entrance;A little help with bathing/dressing/bathroom   Equipment Recommendations  Tub/shower seat (may obtain outside of hospital)    Recommendations for Other Services      Precautions / Restrictions Precautions Precautions: Fall;Sternal Restrictions Weight Bearing Restrictions Per Provider Order: No       Mobility Bed Mobility Overal bed mobility: Modified Independent                  Transfers Overall transfer level: Modified independent Equipment used: Rolling walker (2 wheels) Transfers: Sit to/from Stand                   Balance Overall balance assessment: Mild deficits observed, not formally tested                                         ADL either performed or assessed with clinical judgement   ADL Overall ADL's : Needs assistance/impaired                     Lower Body Dressing: Minimal assistance;Sit to/from stand Lower Body Dressing Details (indicate cue type and reason): assist to don socks and slippers. limited  by edema, decreased flexibility and wounds on BLE. spouse at bedside, reports able to assist as needed- denies concerns             Functional mobility during ADLs: Modified independent;Rolling walker (2 wheels) General ADL Comments: pt reports he has been mobilizing to/from bathroom without assist or issues    Extremity/Trunk Assessment Upper Extremity Assessment Upper Extremity Assessment: Overall WFL for tasks assessed;Right hand dominant   Lower Extremity Assessment Lower Extremity Assessment: Defer to PT evaluation        Vision   Vision Assessment?: No apparent visual deficits   Perception     Praxis     Communication Communication Communication: No apparent difficulties   Cognition Arousal: Alert Behavior During Therapy: WFL for tasks assessed/performed Cognition: No apparent impairments                               Following commands: Intact        Cueing   Cueing Techniques: Verbal cues  Exercises      Shoulder Instructions       General Comments Wife at bedside    Pertinent Vitals/ Pain       Pain Assessment Pain Assessment: No/denies pain  Home Living  Prior Functioning/Environment              Frequency  Min 2X/week        Progress Toward Goals  OT Goals(current goals can now be found in the care plan section)  Progress towards OT goals: Progressing toward goals  Acute Rehab OT Goals Patient Stated Goal: go home today if able OT Goal Formulation: With patient Time For Goal Achievement: 10/20/23 Potential to Achieve Goals: Good ADL Goals Pt Will Perform Grooming: with modified independence;standing Pt Will Perform Lower Body Dressing: with modified independence;sit to/from stand Pt Will Transfer to Toilet: with modified independence;regular height toilet;ambulating  Plan      Co-evaluation                 AM-PAC OT 6 Clicks Daily Activity      Outcome Measure   Help from another person eating meals?: None Help from another person taking care of personal grooming?: A Little Help from another person toileting, which includes using toliet, bedpan, or urinal?: A Little Help from another person bathing (including washing, rinsing, drying)?: A Little Help from another person to put on and taking off regular upper body clothing?: None Help from another person to put on and taking off regular lower body clothing?: A Little 6 Click Score: 20    End of Session Equipment Utilized During Treatment: Rolling walker (2 wheels)  OT Visit Diagnosis: Unsteadiness on feet (R26.81);Pain;Muscle weakness (generalized) (M62.81);Other symptoms and signs involving cognitive function   Activity Tolerance Patient tolerated treatment well   Patient Left Other (comment) (left to ambulate in hallway with wife)   Nurse Communication          Time: 9065-9052 OT Time Calculation (min): 13 min  Charges: OT General Charges $OT Visit: 1 Visit OT Treatments $Therapeutic Activity: 8-22 mins  Mliss NOVAK, OTR/L Acute Rehab Services Office: [phone]   Mliss Fish 10/10/2023, 11:06 AM

## 2023-10-11 ENCOUNTER — Encounter (HOSPITAL_COMMUNITY): Payer: Self-pay | Admitting: Surgery

## 2023-10-11 ENCOUNTER — Other Ambulatory Visit: Payer: Self-pay

## 2023-10-11 DIAGNOSIS — Z952 Presence of prosthetic heart valve: Secondary | ICD-10-CM | POA: Diagnosis not present

## 2023-10-11 LAB — CBC
HCT: 34.7 % — ABNORMAL LOW (ref 39.0–52.0)
Hemoglobin: 11.6 g/dL — ABNORMAL LOW (ref 13.0–17.0)
MCH: 28.5 pg (ref 26.0–34.0)
MCHC: 33.4 g/dL (ref 30.0–36.0)
MCV: 85.3 fL (ref 80.0–100.0)
Platelets: 341 K/uL (ref 150–400)
RBC: 4.07 MIL/uL — ABNORMAL LOW (ref 4.22–5.81)
RDW: 12.8 % (ref 11.5–15.5)
WBC: 10.1 K/uL (ref 4.0–10.5)
nRBC: 0 % (ref 0.0–0.2)

## 2023-10-11 LAB — GLUCOSE, CAPILLARY
Glucose-Capillary: 146 mg/dL — ABNORMAL HIGH (ref 70–99)
Glucose-Capillary: 167 mg/dL — ABNORMAL HIGH (ref 70–99)
Glucose-Capillary: 171 mg/dL — ABNORMAL HIGH (ref 70–99)
Glucose-Capillary: 182 mg/dL — ABNORMAL HIGH (ref 70–99)
Glucose-Capillary: 232 mg/dL — ABNORMAL HIGH (ref 70–99)
Glucose-Capillary: 117 mg/dL — ABNORMAL HIGH (ref 70–99)

## 2023-10-11 LAB — BASIC METABOLIC PANEL WITH GFR
Anion gap: 8 (ref 5–15)
BUN: 29 mg/dL — ABNORMAL HIGH (ref 6–20)
CO2: 32 mmol/L (ref 22–32)
Calcium: 9 mg/dL (ref 8.9–10.3)
Chloride: 99 mmol/L (ref 98–111)
Creatinine, Ser: 1.26 mg/dL — ABNORMAL HIGH (ref 0.61–1.24)
GFR, Estimated: >60 mL/min (ref >60)
Glucose, Bld: 73 mg/dL (ref 70–99)
Potassium: 4.2 mmol/L (ref 3.5–5.1)
Sodium: 139 mmol/L (ref 135–145)

## 2023-10-11 LAB — MAGNESIUM: Magnesium: 2 mg/dL (ref 1.7–2.4)

## 2023-10-11 NOTE — Progress Notes (Addendum)
  Subjective: Feeling better and stronger overall  Objective: Vital signs in last 24 hours: Temp:  [97.6 F (36.4 C)-98.1 F (36.7 C)] 97.6 F (36.4 C) (07/08 0717) Pulse Rate:  [85-97] 87 (07/08 0717) Cardiac Rhythm: Normal sinus rhythm;Bundle branch block (07/07 1900) Resp:  [13-20] 13 (07/08 0717) BP: (103-119)/(65-81) 112/65 (07/08 0717) SpO2:  [94 %-99 %] 99 % (07/08 0717) Weight:  [88.5 kg] 88.5 kg (07/08 0500)  Hemodynamic parameters for last 24 hours:    Intake/Output from previous day: 07/07 0701 - 07/08 0700 In: 460 [P.O.:240; I.V.:20; IV Piggyback:200] Out: 1375 [Urine:1375] Intake/Output this shift: No intake/output data recorded.  General appearance: alert, cooperative, and no distress Heart: regular rate and rhythm Lungs: clear to auscultation bilaterally Abdomen: benign Extremities: edema and erythema cont to improve Wound: stable appearance of blistering wounds  Lab Results: Recent Labs    10/09/23 0417 10/11/23 0416  WBC 11.3* 10.1  HGB 11.2* 11.6*  HCT 33.5* 34.7*  PLT 337 341   BMET:  Recent Labs    10/10/23 0927 10/11/23 0416  NA 139 139  K 5.0 4.2  CL 98 99  CO2 33* 32  GLUCOSE 170* 73  BUN 25* 29*  CREATININE 1.13 1.26*  CALCIUM  9.0 9.0    PT/INR: No results for input(s): LABPROT, INR in the last 72 hours. ABG    Component Value Date/Time   PHART 7.260 (L) 09/22/2023 1758   HCO3 20.2 09/22/2023 1758   TCO2 22 09/22/2023 1758   ACIDBASEDEF 7.0 (H) 09/22/2023 1758   O2SAT 91 09/22/2023 1758   CBG (last 3)  Recent Labs    10/10/23 1648 10/10/23 2116 10/11/23 0632  GLUCAP 146* 167* 171*    Meds Scheduled Meds:  aspirin  EC  325 mg Oral Daily   atorvastatin   40 mg Oral QHS   docusate sodium   100 mg Oral BID   enoxaparin  (LOVENOX ) injection  40 mg Subcutaneous Q24H   gabapentin   100 mg Oral BID   insulin  aspart  0-5 Units Subcutaneous QHS   insulin  aspart  0-9 Units Subcutaneous TID WC   insulin  aspart  5 Units  Subcutaneous TID WC   insulin  glargine-yfgn  24 Units Subcutaneous BID   metoprolol  succinate  25 mg Oral Daily   potassium chloride   20 mEq Oral Daily   sacubitril -valsartan   1 tablet Oral BID   sodium chloride  flush  3 mL Intravenous Q12H   spironolactone   25 mg Oral Daily   torsemide   40 mg Oral Daily   Continuous Infusions:   ceFAZolin  (ANCEF ) IV 1 g (10/11/23 0533)   PRN Meds:.acetaminophen  **OR** acetaminophen , ondansetron  (ZOFRAN ) IV, oxyCODONE   Xrays No results found.  Assessment/Plan:  1 afeb, VSS, O2 sats good on RA 2 good UOP, weight cont to trend lower w/ diuresis 3 BS control pretty good currently 4 creat up a little to 1.26, BUN up a little to 29- on torsemide  and spiro- AHF assisting w/ management of diuresis and GDMT- base lime CKD 3 5 no leukocytosis, H/H stable 6 cont ancef - steady improvement of cellulitis , cont dressing changes 7 keep legs elevated and ambulate    LOS: 6 days    Lemond FORBES Cera PA-C Pager [phone] 10/11/2023 Patient seen and examined, agree with above Day 4/7 Ancef   Elspeth C. Kerrin, MD [facility] [phone]

## 2023-10-11 NOTE — Progress Notes (Addendum)
 Advanced Heart Failure Rounding Note  Cardiologist: Madonna Large, DO  Chief Complaint: Heart Failure  Subjective:   7/2: Diuresed with IV lasix .   Feels good this morning. Says his legs are continuing to improve. Walks in the hall ~4x/day.   Objective:   Weight Range: 88.5 kg Body mass index is 32.47 kg/m.   Vital Signs:   Temp:  [97.6 F (36.4 C)-98.1 F (36.7 C)] 97.6 F (36.4 C) (07/08 0717) Pulse Rate:  [85-97] 87 (07/08 0717) Resp:  [13-20] 13 (07/08 0717) BP: (103-119)/(65-81) 112/65 (07/08 0717) SpO2:  [94 %-99 %] 99 % (07/08 0717) Weight:  [88.5 kg] 88.5 kg (07/08 0500) Last BM Date : 10/10/23  Weight change: Filed Weights   10/09/23 0500 10/10/23 0251 10/11/23 0500  Weight: 91 kg 89.7 kg 88.5 kg   Intake/Output:   Intake/Output Summary (Last 24 hours) at 10/11/2023 0744 Last data filed at 10/11/2023 0533 Gross per 24 hour  Intake 460 ml  Output 1375 ml  Net -915 ml    Physical Exam  General:  well appearing.  No respiratory difficulty Neck: supple. JVD ~7 cm.  Cor: PMI nondisplaced. Regular rate & rhythm. No rubs, gallops or murmurs. Lungs: diminished Extremities: no cyanosis, clubbing, rash, trace ankle edema. BLE weeping wounds Neuro: alert & oriented x 3. Moves all 4 extremities w/o difficulty. Affect pleasant.   Telemetry   NSR 80s-90s 0-1 PVC/hr (Personally reviewed)    EKG    N/A  Labs    CBC Recent Labs    10/09/23 0417 10/11/23 0416  WBC 11.3* 10.1  HGB 11.2* 11.6*  HCT 33.5* 34.7*  MCV 85.2 85.3  PLT 337 341   Basic Metabolic Panel Recent Labs    92/92/74 0927 10/11/23 0416  NA 139 139  K 5.0 4.2  CL 98 99  CO2 33* 32  GLUCOSE 170* 73  BUN 25* 29*  CREATININE 1.13 1.26*  CALCIUM  9.0 9.0  MG  --  2.0   Liver Function Tests No results for input(s): AST, ALT, ALKPHOS, BILITOT, PROT, ALBUMIN  in the last 72 hours.  No results for input(s): LIPASE, AMYLASE in the last 72 hours. Cardiac Enzymes No  results for input(s): CKTOTAL, CKMB, CKMBINDEX, TROPONINI in the last 72 hours.  BNP: BNP (last 3 results) Recent Labs    09/10/23 2142 09/16/23 1722 10/05/23 2003  BNP 440.4* 376.4* 459.1*   ProBNP (last 3 results) Recent Labs    06/06/23 1633 06/28/23 1402 07/27/23 1314  PROBNP 1,093* 1,172* 1,063*   D-Dimer No results for input(s): DDIMER in the last 72 hours. Hemoglobin A1C No results for input(s): HGBA1C in the last 72 hours. Fasting Lipid Panel No results for input(s): CHOL, HDL, LDLCALC, TRIG, CHOLHDL, LDLDIRECT in the last 72 hours. Thyroid Function Tests No results for input(s): TSH, T4TOTAL, T3FREE, THYROIDAB in the last 72 hours.  Invalid input(s): FREET3  Other results:  Imaging  No results found.  Medications:   Scheduled Medications:  aspirin  EC  325 mg Oral Daily   atorvastatin   40 mg Oral QHS   docusate sodium   100 mg Oral BID   enoxaparin  (LOVENOX ) injection  40 mg Subcutaneous Q24H   gabapentin   100 mg Oral BID   insulin  aspart  0-5 Units Subcutaneous QHS   insulin  aspart  0-9 Units Subcutaneous TID WC   insulin  aspart  5 Units Subcutaneous TID WC   insulin  glargine-yfgn  24 Units Subcutaneous BID   metoprolol  succinate  25 mg Oral  Daily   potassium chloride   20 mEq Oral Daily   sacubitril -valsartan   1 tablet Oral BID   sodium chloride  flush  3 mL Intravenous Q12H   spironolactone   25 mg Oral Daily   torsemide   40 mg Oral Daily    Infusions:   ceFAZolin  (ANCEF ) IV 1 g (10/11/23 0533)    PRN Medications: acetaminophen  **OR** acetaminophen , ondansetron  (ZOFRAN ) IV, oxyCODONE  Patient Profile   58 y.o. male with history of HFrEF, HTN, T2DM, CKD 2, and aortic stenosis s/p bioprothestic AVR with Dr. Kerrin on 09/22/23.   Assessment/Plan  1. Acute on chronic systolic CHF: Nonischemic cardiomyopathy.  ?Valvular related to severe AS versus LBBB cardiomyopathy.  Cath in pre-AVR showed no significant CAD but  markedly elevated filling pressures.  Echo in 6/25 (pre-op) showed EF 20-25% with septal-lateral dyssynchrony, normal RV function, severe aortic stenosis.  Patient is now s/p bioprosthetic AVR.  Intra-op TEE reported EF 50% but no post-op echo was done.  - Echo 10/06/23 EF 45-50%, significant LV dyssynchrony, LV with GHK, RV mildly reduced, LA mildly dilated, trivial MR, normal structure and function of AV prosthesis - Volume status stable. Continue Torsemide  40 mg daily.   - Continue spiro 25 mg daily  - Continue Toprol  XL 25 mg daily - Continue Entresto  24-26 mg BID - Allergy to farxiga  and invokana- syncope/dizziness.  - Renal function stable.  - Would benefit from CRT-D give LBBB if EF remains low a few months after AVR and medical optimization.   2. Aortic stenosis: S/p bioprosthetic AVR in 6/25.  - Normal structure and function of AV prosthesis. AV mean gradient 8 mmHG  3. CKD stage 3: Creatinine baseline ~ 1.5-1.7 Stable.   4. Venous stasis ulcers: R and LLE wounds. WOC consulted.  - Continues on cefazolin   Stable for discharge from AHF standpoint.   Length of Stay: 6  Beckey LITTIE Coe, NP  10/11/2023, 7:44 AM  Advanced Heart Failure Team Pager [phone] (M-F; 7a - 5p)  Please contact [facility] for night-coverage after hours (5p -7a ) and weekends on [email or web address]  Patient seen with NP, I formulated the plan and agree with the above note.   Walking in halls, denies dyspnea.  Reports that leg swelling and ulceration seems to be improving.   General: NAD Neck: JVP 8 cm, no thyromegaly or thyroid nodule.  Lungs: Clear to auscultation bilaterally with normal respiratory effort. CV: Nondisplaced PMI.  Heart regular S1/S2, no S3/S4, 1/6 SEM RUSB.  1+ ankle edema.   Abdomen: Soft, nontender, no hepatosplenomegaly, no distention.  Skin: Intact without lesions or rashes.  Neurologic: Alert and oriented x 3.  Psych: Normal affect. Extremities: Ulcerations on lower legs.   HEENT:  Normal.   Patient is stable from cardiac standpoint, post-op echo with EF 45-50% and mild RV dysfunction, normally functioning bioprosthetic aortic valve.  Suspect he still has at least mild volume overload, weight coming down. Creatinine mildly higher at 1.26.  - Continue torsemide  40 mg daily.  If creatinine rises again tomorrow will cut back.  - Continue Entresto  24/26 bid.  - Continue spironolactone  and Toprol  XL.    He is on cefazolin  for venous stasis ulcerations and possible cellulitis lower legs.  Per primary team.   Ezra Shuck 10/11/2023 9:27 AM

## 2023-10-12 ENCOUNTER — Ambulatory Visit: Admitting: Physician Assistant

## 2023-10-12 DIAGNOSIS — Z952 Presence of prosthetic heart valve: Secondary | ICD-10-CM | POA: Diagnosis not present

## 2023-10-12 LAB — BASIC METABOLIC PANEL WITH GFR
Anion gap: 8 (ref 5–15)
BUN: 33 mg/dL — ABNORMAL HIGH (ref 6–20)
CO2: 30 mmol/L (ref 22–32)
Calcium: 8.6 mg/dL — ABNORMAL LOW (ref 8.9–10.3)
Chloride: 98 mmol/L (ref 98–111)
Creatinine, Ser: 1.18 mg/dL (ref 0.61–1.24)
GFR, Estimated: >60 mL/min (ref >60)
Glucose, Bld: 175 mg/dL — ABNORMAL HIGH (ref 70–99)
Potassium: 4 mmol/L (ref 3.5–5.1)
Sodium: 136 mmol/L (ref 135–145)

## 2023-10-12 LAB — CBC
HCT: 35.2 % — ABNORMAL LOW (ref 39.0–52.0)
Hemoglobin: 11.7 g/dL — ABNORMAL LOW (ref 13.0–17.0)
MCH: 28.4 pg (ref 26.0–34.0)
MCHC: 33.2 g/dL (ref 30.0–36.0)
MCV: 85.4 fL (ref 80.0–100.0)
Platelets: 321 K/uL (ref 150–400)
RBC: 4.12 MIL/uL — ABNORMAL LOW (ref 4.22–5.81)
RDW: 12.9 % (ref 11.5–15.5)
WBC: 8.4 K/uL (ref 4.0–10.5)
nRBC: 0 % (ref 0.0–0.2)

## 2023-10-12 LAB — GLUCOSE, CAPILLARY
Glucose-Capillary: 153 mg/dL — ABNORMAL HIGH (ref 70–99)
Glucose-Capillary: 189 mg/dL — ABNORMAL HIGH (ref 70–99)
Glucose-Capillary: 183 mg/dL — ABNORMAL HIGH (ref 70–99)
Glucose-Capillary: 227 mg/dL — ABNORMAL HIGH (ref 70–99)

## 2023-10-12 MED ORDER — INSULIN ASPART 100 UNIT/ML IJ SOLN
8.0000 [IU] | Freq: Three times a day (TID) | INTRAMUSCULAR | Status: DC
  Administered 2023-10-12 – 2023-10-14 (×6): 8 [IU] via SUBCUTANEOUS

## 2023-10-12 NOTE — Progress Notes (Signed)
  Subjective: Conts to feel well  Objective: Vital signs in last 24 hours: Temp:  [97.5 F (36.4 C)-98.1 F (36.7 C)] 97.9 F (36.6 C) (07/09 0331) Pulse Rate:  [87-102] 87 (07/09 0331) Cardiac Rhythm: Sinus tachycardia (07/08 2030) Resp:  [17-22] 17 (07/09 0331) BP: (86-112)/(63-75) 109/73 (07/09 0331) SpO2:  [95 %-96 %] 95 % (07/09 0331) Weight:  [88.2 kg] 88.2 kg (07/09 0500)  Hemodynamic parameters for last 24 hours:    Intake/Output from previous day: 07/08 0701 - 07/09 0700 In: -  Out: 1475 [Urine:1475] Intake/Output this shift: No intake/output data recorded.  General appearance: alert, cooperative, and no distress Heart: regular rate and rhythm Lungs: clear to auscultation bilaterally Abdomen: benign Extremities: edema conts to improve Wound: dressing intact  Lab Results: Recent Labs    10/11/23 0416  WBC 10.1  HGB 11.6*  HCT 34.7*  PLT 341   BMET:  Recent Labs    10/10/23 0927 10/11/23 0416  NA 139 139  K 5.0 4.2  CL 98 99  CO2 33* 32  GLUCOSE 170* 73  BUN 25* 29*  CREATININE 1.13 1.26*  CALCIUM  9.0 9.0    PT/INR: No results for input(s): LABPROT, INR in the last 72 hours. ABG    Component Value Date/Time   PHART 7.260 (L) 09/22/2023 1758   HCO3 20.2 09/22/2023 1758   TCO2 22 09/22/2023 1758   ACIDBASEDEF 7.0 (H) 09/22/2023 1758   O2SAT 91 09/22/2023 1758   CBG (last 3)  Recent Labs    10/11/23 1546 10/11/23 2125 10/12/23 0633  GLUCAP 232* 117* 153*    Meds Scheduled Meds:  aspirin  EC  325 mg Oral Daily   atorvastatin   40 mg Oral QHS   docusate sodium   100 mg Oral BID   enoxaparin  (LOVENOX ) injection  40 mg Subcutaneous Q24H   gabapentin   100 mg Oral BID   insulin  aspart  0-5 Units Subcutaneous QHS   insulin  aspart  0-9 Units Subcutaneous TID WC   insulin  aspart  5 Units Subcutaneous TID WC   insulin  glargine-yfgn  24 Units Subcutaneous BID   metoprolol  succinate  25 mg Oral Daily   potassium chloride   20 mEq Oral  Daily   sacubitril -valsartan   1 tablet Oral BID   sodium chloride  flush  3 mL Intravenous Q12H   spironolactone   25 mg Oral Daily   torsemide   40 mg Oral Daily   Continuous Infusions:   ceFAZolin  (ANCEF ) IV 1 g (10/12/23 0600)   PRN Meds:.acetaminophen  **OR** acetaminophen , ondansetron  (ZOFRAN ) IV, oxyCODONE   Xrays No results found.  Assessment/Plan:  1 afeb, S BP 100's-110's, one off reading 86, sinus rhythm/tach(low 100's)some short runs of SVT/VT- on beta blocker, lytes yesterday were normal - today's labs pending- defer to cardiology   2 O2 sats good on RA 3 good UOP, weight slightly lower 4 AHF assisting w/ Diuresis/GDMT, baseline CKD 3 5 Day 5/7  IV ancef  for LE blisters/cellulitis, cont dressing changes/leg elevation 6 CBG control is reasonable, one reading in 200's- cont current management- likely home meds at D/C 7 lovenox  for DVT ppx   LOS: 7 days    Lemond FORBES Cera PA-C Pager [phone] 10/12/2023

## 2023-10-12 NOTE — Progress Notes (Signed)
 During PM dsg change, LLE aquacel stuck to blister despite moistening dsg before removal, took off fresh scab. Replaced w/ telfa instead of aquacel to present sticking.

## 2023-10-12 NOTE — Progress Notes (Signed)
 Mobility Specialist Progress Note:    10/12/23 0859  Mobility  Activity Ambulated with assistance in room;Ambulated with assistance in hallway  Level of Assistance Standby assist, set-up cues, supervision of patient - no hands on  Assistive Device Front wheel walker  Distance Ambulated (ft) 470 ft  RUE Weight Bearing Per Provider Order NWB  LUE Weight Bearing Per Provider Order NWB  Activity Response Tolerated well  Mobility Referral Yes  Mobility visit 1 Mobility  Mobility Specialist Start Time (ACUTE ONLY) 0859  Mobility Specialist Stop Time (ACUTE ONLY) 0905  Mobility Specialist Time Calculation (min) (ACUTE ONLY) 6 min   Pt received in room, agreeable to mobility session. Ambulated in hallway with RW and SV. Tolerated well, HR 107 bpm. Returned pt to room, left with all needs met.    Jonathon Bray Mobility Specialist Please contact via Special educational needs teacher or  Rehab office at [phone]

## 2023-10-12 NOTE — Progress Notes (Addendum)
 Advanced Heart Failure Rounding Note  Cardiologist: Madonna Large, DO  Chief Complaint: Heart Failure  Subjective:    Wt stable, SCr 1.26>>1.18 today. SBPs low 100s.   Remains on IV abx for leg wounds.   Feels ok today. Resting in bed. Denies dyspnea. No complaints.   Objective:   Weight Range: 88.2 kg Body mass index is 32.36 kg/m.   Vital Signs:   Temp:  [97.5 F (36.4 C)-98.1 F (36.7 C)] 98 F (36.7 C) (07/09 0749) Pulse Rate:  [83-102] 83 (07/09 0749) Resp:  [17-22] 18 (07/09 0749) BP: (86-112)/(63-75) 105/74 (07/09 0749) SpO2:  [95 %-97 %] 97 % (07/09 0749) Weight:  [88.2 kg] 88.2 kg (07/09 0500) Last BM Date : 10/10/23  Weight change: Filed Weights   10/10/23 0251 10/11/23 0500 10/12/23 0500  Weight: 89.7 kg 88.5 kg 88.2 kg   Intake/Output:   Intake/Output Summary (Last 24 hours) at 10/12/2023 0954 Last data filed at 10/12/2023 0800 Gross per 24 hour  Intake --  Output 1825 ml  Net -1825 ml    Physical Exam   General:  Well appearing. No respiratory difficulty HEENT: normal Neck: supple. JVD 6 cm. Carotids 2+ bilat; no bruits. No lymphadenopathy or thyromegaly appreciated. Cor: PMI nondisplaced. Regular rate & rhythm. No rubs, gallops or murmurs. Lungs: clear Abdomen: soft, nontender, nondistended. No hepatosplenomegaly. No bruits or masses. Good bowel sounds. Extremities: no cyanosis, clubbing, rash, edema b/l legs wrapped  Neuro: alert & oriented x 3, cranial nerves grossly intact. moves all 4 extremities w/o difficulty. Affect pleasant.   Telemetry   NSR 80s, NSVT overnight 4 beats (Personally reviewed)    EKG    N/A  Labs    CBC Recent Labs    10/11/23 0416 10/12/23 0714  WBC 10.1 8.4  HGB 11.6* 11.7*  HCT 34.7* 35.2*  MCV 85.3 85.4  PLT 341 321   Basic Metabolic Panel Recent Labs    92/91/74 0416 10/12/23 0714  NA 139 136  K 4.2 4.0  CL 99 98  CO2 32 30  GLUCOSE 73 175*  BUN 29* 33*  CREATININE 1.26* 1.18  CALCIUM   9.0 8.6*  MG 2.0  --    Liver Function Tests No results for input(s): AST, ALT, ALKPHOS, BILITOT, PROT, ALBUMIN  in the last 72 hours.  No results for input(s): LIPASE, AMYLASE in the last 72 hours. Cardiac Enzymes No results for input(s): CKTOTAL, CKMB, CKMBINDEX, TROPONINI in the last 72 hours.  BNP: BNP (last 3 results) Recent Labs    09/10/23 2142 09/16/23 1722 10/05/23 2003  BNP 440.4* 376.4* 459.1*   ProBNP (last 3 results) Recent Labs    06/06/23 1633 06/28/23 1402 07/27/23 1314  PROBNP 1,093* 1,172* 1,063*   D-Dimer No results for input(s): DDIMER in the last 72 hours. Hemoglobin A1C No results for input(s): HGBA1C in the last 72 hours. Fasting Lipid Panel No results for input(s): CHOL, HDL, LDLCALC, TRIG, CHOLHDL, LDLDIRECT in the last 72 hours. Thyroid Function Tests No results for input(s): TSH, T4TOTAL, T3FREE, THYROIDAB in the last 72 hours.  Invalid input(s): FREET3  Other results:  Imaging  No results found.  Medications:   Scheduled Medications:  aspirin  EC  325 mg Oral Daily   atorvastatin   40 mg Oral QHS   docusate sodium   100 mg Oral BID   enoxaparin  (LOVENOX ) injection  40 mg Subcutaneous Q24H   gabapentin   100 mg Oral BID   insulin  aspart  0-5 Units Subcutaneous QHS  insulin  aspart  0-9 Units Subcutaneous TID WC   insulin  aspart  8 Units Subcutaneous TID WC   insulin  glargine-yfgn  24 Units Subcutaneous BID   metoprolol  succinate  25 mg Oral Daily   potassium chloride   20 mEq Oral Daily   sacubitril -valsartan   1 tablet Oral BID   sodium chloride  flush  3 mL Intravenous Q12H   spironolactone   25 mg Oral Daily   torsemide   40 mg Oral Daily    Infusions:   ceFAZolin  (ANCEF ) IV 1 g (10/12/23 0600)    PRN Medications: acetaminophen  **OR** acetaminophen , ondansetron  (ZOFRAN ) IV, oxyCODONE  Patient Profile   58 y.o. male with history of HFrEF, HTN, T2DM, CKD 2, and aortic stenosis s/p  bioprothestic AVR with Dr. Kerrin on 09/22/23.   Assessment/Plan  1. Acute on chronic systolic CHF: Nonischemic cardiomyopathy.  ?Valvular related to severe AS versus LBBB cardiomyopathy.  Cath in pre-AVR showed no significant CAD but markedly elevated filling pressures.  Echo in 6/25 (pre-op) showed EF 20-25% with septal-lateral dyssynchrony, normal RV function, severe aortic stenosis.  Patient is now s/p bioprosthetic AVR.  Intra-op TEE reported EF 50%. 2D echo post-op, 10/06/23, EF 45-50%, significant LV dyssynchrony, LV with GHK, RV mildly reduced, LA mildly dilated, trivial MR, normal structure and function of AV prosthesis - volume ok on exam. Continue Torsemide  40 mg daily.   - Continue spiro 25 mg daily  - Continue Toprol  XL 25 mg daily - Continue Entresto  24-26 mg BID. BP too soft to titrate  - Allergy to farxiga  and invokana- syncope/dizziness.  - Renal function stable.  - Would benefit from CRT-D give LBBB if EF remains low a few months after AVR and medical optimization.   2. Aortic stenosis: S/p bioprosthetic AVR in 6/25.  - Normal structure and function of AV prosthesis. AV mean gradient 8 mmHG  3. CKD stage 3: Creatinine baseline ~ 1.5-1.7 - stable, SCr 1.18 today   4. Venous stasis ulcers: R and LLE wounds. WOC consulted.  - Continues on IV cefazolin  - management per primary team    Length of Stay: [address], PA-C  10/12/2023, 9:54 AM  Advanced Heart Failure Team Pager [phone] (M-F; 7a - 5p)  Please contact [facility] for night-coverage after hours (5p -7a ) and weekends on [email or web address]  Patient seen with PA, I formulated the plan and agree with the above note.   Creatinine stable 1.18.  Weight down 1 lb.  No complaints.   General: NAD Neck: No JVD, no thyromegaly or thyroid nodule.  Lungs: Clear to auscultation bilaterally with normal respiratory effort. CV: Nondisplaced PMI.  Heart regular S1/S2, no S3/S4, no murmur.  Trace ankle edema.   Abdomen: Soft, nontender, no hepatosplenomegaly, no distention.  Skin: Intact without lesions or rashes.  Neurologic: Alert and oriented x 3.  Psych: Normal affect. Extremities: Lower leg healing ulcerations.   HEENT: Normal.   Patient is stable from cardiac standpoint, post-op echo with EF 45-50% and mild RV dysfunction, normally functioning bioprosthetic aortic valve. Looks euvolemic now, stable creatinine.  - Continue torsemide  40 mg daily. .  - Continue Entresto  24/26 bid.  - Continue spironolactone  and Toprol  XL.    He is on cefazolin  for venous stasis ulcerations and possible cellulitis lower legs.  He is on day 5/7.  Hopefully home when off IV abx.   Ezra Shuck 10/12/2023 11:11 AM

## 2023-10-13 ENCOUNTER — Other Ambulatory Visit (HOSPITAL_COMMUNITY): Payer: Self-pay

## 2023-10-13 ENCOUNTER — Telehealth (HOSPITAL_COMMUNITY): Payer: Self-pay | Admitting: Pharmacy Technician

## 2023-10-13 DIAGNOSIS — Z952 Presence of prosthetic heart valve: Secondary | ICD-10-CM | POA: Diagnosis not present

## 2023-10-13 DIAGNOSIS — N183 Chronic kidney disease, stage 3 unspecified: Secondary | ICD-10-CM

## 2023-10-13 DIAGNOSIS — I472 Ventricular tachycardia, unspecified: Secondary | ICD-10-CM

## 2023-10-13 LAB — CBC
HCT: 35.7 % — ABNORMAL LOW (ref 39.0–52.0)
Hemoglobin: 11.9 g/dL — ABNORMAL LOW (ref 13.0–17.0)
MCH: 28.5 pg (ref 26.0–34.0)
MCHC: 33.3 g/dL (ref 30.0–36.0)
MCV: 85.6 fL (ref 80.0–100.0)
Platelets: 334 K/uL (ref 150–400)
RBC: 4.17 MIL/uL — ABNORMAL LOW (ref 4.22–5.81)
RDW: 12.8 % (ref 11.5–15.5)
WBC: 10 K/uL (ref 4.0–10.5)
nRBC: 0 % (ref 0.0–0.2)

## 2023-10-13 LAB — BASIC METABOLIC PANEL WITH GFR
Anion gap: 8 (ref 5–15)
BUN: 39 mg/dL — ABNORMAL HIGH (ref 6–20)
CO2: 31 mmol/L (ref 22–32)
Calcium: 8.8 mg/dL — ABNORMAL LOW (ref 8.9–10.3)
Chloride: 99 mmol/L (ref 98–111)
Creatinine, Ser: 1.56 mg/dL — ABNORMAL HIGH (ref 0.61–1.24)
GFR, Estimated: 51 mL/min — ABNORMAL LOW (ref >60)
Glucose, Bld: 131 mg/dL — ABNORMAL HIGH (ref 70–99)
Potassium: 4.5 mmol/L (ref 3.5–5.1)
Sodium: 138 mmol/L (ref 135–145)

## 2023-10-13 LAB — MAGNESIUM: Magnesium: 2 mg/dL (ref 1.7–2.4)

## 2023-10-13 LAB — GLUCOSE, CAPILLARY
Glucose-Capillary: 92 mg/dL (ref 70–99)
Glucose-Capillary: 167 mg/dL — ABNORMAL HIGH (ref 70–99)
Glucose-Capillary: 168 mg/dL — ABNORMAL HIGH (ref 70–99)
Glucose-Capillary: 185 mg/dL — ABNORMAL HIGH (ref 70–99)

## 2023-10-13 MED ORDER — APIXABAN 5 MG PO TABS
5.0000 mg | ORAL_TABLET | Freq: Two times a day (BID) | ORAL | Status: DC
  Administered 2023-10-13 – 2023-10-15 (×5): 5 mg via ORAL
  Filled 2023-10-13 (×5): qty 1

## 2023-10-13 MED ORDER — TORSEMIDE 20 MG PO TABS: 40.0000 mg | ORAL_TABLET | Freq: Every day | ORAL | Status: DC

## 2023-10-13 MED ORDER — AMIODARONE HCL 200 MG PO TABS
200.0000 mg | ORAL_TABLET | Freq: Two times a day (BID) | ORAL | Status: DC
  Administered 2023-10-13 – 2023-10-15 (×5): 200 mg via ORAL
  Filled 2023-10-13 (×5): qty 1

## 2023-10-13 MED ORDER — TORSEMIDE 20 MG PO TABS: 20.0000 mg | ORAL_TABLET | Freq: Every day | ORAL | Status: DC

## 2023-10-13 MED ORDER — ASPIRIN 81 MG PO CHEW
81.0000 mg | CHEWABLE_TABLET | Freq: Every day | ORAL | Status: DC
  Administered 2023-10-14 – 2023-10-15 (×2): 81 mg via ORAL
  Filled 2023-10-13 (×2): qty 1

## 2023-10-13 MED ORDER — SACUBITRIL-VALSARTAN 24-26 MG PO TABS
1.0000 | ORAL_TABLET | Freq: Two times a day (BID) | ORAL | Status: DC
  Administered 2023-10-13 – 2023-10-15 (×4): 1 via ORAL
  Filled 2023-10-13 (×4): qty 1

## 2023-10-13 NOTE — Progress Notes (Signed)
  Subjective: No new c/o  Objective: Vital signs in last 24 hours: Temp:  [97.3 F (36.3 C)-98.2 F (36.8 C)] 97.3 F (36.3 C) (07/10 0300) Pulse Rate:  [81-100] 81 (07/10 0300) Cardiac Rhythm: Atrial flutter;Bundle branch block (07/10 0452) Resp:  [14-18] 18 (07/10 0300) BP: (96-112)/(67-77) 101/74 (07/10 0300) SpO2:  [94 %-98 %] 96 % (07/10 0300) Weight:  [88.5 kg] 88.5 kg (07/10 0500)  Hemodynamic parameters for last 24 hours:    Intake/Output from previous day: 07/09 0701 - 07/10 0700 In: 300 [IV Piggyback:300] Out: 2250 [Urine:2250] Intake/Output this shift: No intake/output data recorded.  General appearance: alert, cooperative, and no distress Heart: regular rate and rhythm Lungs: clear to auscultation bilaterally Abdomen: benign Extremities: minor edema Wound: stable, erythema near resolved   Lab Results: Recent Labs    10/12/23 0714 10/13/23 0311  WBC 8.4 10.0  HGB 11.7* 11.9*  HCT 35.2* 35.7*  PLT 321 334   BMET:  Recent Labs    10/12/23 0714 10/13/23 0311  NA 136 138  K 4.0 4.5  CL 98 99  CO2 30 31  GLUCOSE 175* 131*  BUN 33* 39*  CREATININE 1.18 1.56*  CALCIUM  8.6* 8.8*    PT/INR: No results for input(s): LABPROT, INR in the last 72 hours. ABG    Component Value Date/Time   PHART 7.260 (L) 09/22/2023 1758   HCO3 20.2 09/22/2023 1758   TCO2 22 09/22/2023 1758   ACIDBASEDEF 7.0 (H) 09/22/2023 1758   O2SAT 91 09/22/2023 1758   CBG (last 3)  Recent Labs    10/12/23 1627 10/12/23 2136 10/13/23 0605  GLUCAP 183* 227* 92    Meds Scheduled Meds:  aspirin  EC  325 mg Oral Daily   atorvastatin   40 mg Oral QHS   docusate sodium   100 mg Oral BID   enoxaparin  (LOVENOX ) injection  40 mg Subcutaneous Q24H   gabapentin   100 mg Oral BID   insulin  aspart  0-5 Units Subcutaneous QHS   insulin  aspart  0-9 Units Subcutaneous TID WC   insulin  aspart  8 Units Subcutaneous TID WC   insulin  glargine-yfgn  24 Units Subcutaneous BID    metoprolol  succinate  25 mg Oral Daily   potassium chloride   20 mEq Oral Daily   sacubitril -valsartan   1 tablet Oral BID   sodium chloride  flush  3 mL Intravenous Q12H   spironolactone   25 mg Oral Daily   torsemide   40 mg Oral Daily   Continuous Infusions:   ceFAZolin  (ANCEF ) IV 1 g (10/13/23 0616)   PRN Meds:.acetaminophen  **OR** acetaminophen , ondansetron  (ZOFRAN ) IV, oxyCODONE   Xrays No results found.  Assessment/Plan:   1 afeb, VSS, LBBB, short burst of Vtach, very short episode of afib, monitor, K+ and MG++ have been ok- cont low dose beta blocker 2 sats good on RA 3 good UOP, >2 liters- diuretics and GDMT as per AHF team 4 conts ancef  day 6/7, dressing changes 5 BS quite variable 6 CKD 3-BUN/ creat has increased 39/1.56- likely will need to decrease diuretics at this point 7 no leukocytosis 8 H/H remain stable 9 lovenox  for DVT ppx 10 cont dressing changes and rehab modalities      LOS: 8 days    Lemond FORBES Cera PA-C Pager [phone] 10/13/2023

## 2023-10-13 NOTE — Progress Notes (Addendum)
 Advanced Heart Failure Rounding Note  Cardiologist: Madonna Large, DO  Chief Complaint: Heart Failure  Subjective:    Wt stable, SCr 1.18>1.56 today. SBPs low 100s.   Remains on IV abx for leg wounds.   Feels fine this morning. Still walking 4-5 laps in the hall daily.   Objective:   Weight Range: 88.5 kg Body mass index is 32.47 kg/m.   Vital Signs:   Temp:  [97.3 F (36.3 C)-98.2 F (36.8 C)] 97.5 F (36.4 C) (07/10 0834) Pulse Rate:  [81-100] 82 (07/10 0834) Resp:  [14-18] 17 (07/10 0834) BP: (96-112)/(67-77) 102/74 (07/10 0834) SpO2:  [94 %-98 %] 95 % (07/10 0834) Weight:  [88.5 kg] 88.5 kg (07/10 0500) Last BM Date : 10/11/23  Weight change: Filed Weights   10/11/23 0500 10/12/23 0500 10/13/23 0500  Weight: 88.5 kg 88.2 kg 88.5 kg   Intake/Output:   Intake/Output Summary (Last 24 hours) at 10/13/2023 0837 Last data filed at 10/13/2023 0745 Gross per 24 hour  Intake 300 ml  Output 2200 ml  Net -1900 ml    Physical Exam  General:  well appearing.  No respiratory difficulty. Sitting on EOB.  Neck: supple. JVD flat.  Cor: PMI nondisplaced. Regular rate & rhythm. No rubs, gallops or murmurs. Lungs: clear, diminished bases Extremities: no cyanosis, clubbing, rash, edema.  Neuro: alert & oriented x 3. Moves all 4 extremities w/o difficulty. Affect pleasant.   Telemetry   NSR 90s, 0-13 PVCs/hr (Personally reviewed)    EKG    N/A  Labs    CBC Recent Labs    10/12/23 0714 10/13/23 0311  WBC 8.4 10.0  HGB 11.7* 11.9*  HCT 35.2* 35.7*  MCV 85.4 85.6  PLT 321 334   Basic Metabolic Panel Recent Labs    92/91/74 0416 10/12/23 0714 10/13/23 0311  NA 139 136 138  K 4.2 4.0 4.5  CL 99 98 99  CO2 32 30 31  GLUCOSE 73 175* 131*  BUN 29* 33* 39*  CREATININE 1.26* 1.18 1.56*  CALCIUM  9.0 8.6* 8.8*  MG 2.0  --  2.0   Liver Function Tests No results for input(s): AST, ALT, ALKPHOS, BILITOT, PROT, ALBUMIN  in the last 72 hours.  No  results for input(s): LIPASE, AMYLASE in the last 72 hours. Cardiac Enzymes No results for input(s): CKTOTAL, CKMB, CKMBINDEX, TROPONINI in the last 72 hours.  BNP: BNP (last 3 results) Recent Labs    09/10/23 2142 09/16/23 1722 10/05/23 2003  BNP 440.4* 376.4* 459.1*   ProBNP (last 3 results) Recent Labs    06/06/23 1633 06/28/23 1402 07/27/23 1314  PROBNP 1,093* 1,172* 1,063*   D-Dimer No results for input(s): DDIMER in the last 72 hours. Hemoglobin A1C No results for input(s): HGBA1C in the last 72 hours. Fasting Lipid Panel No results for input(s): CHOL, HDL, LDLCALC, TRIG, CHOLHDL, LDLDIRECT in the last 72 hours. Thyroid Function Tests No results for input(s): TSH, T4TOTAL, T3FREE, THYROIDAB in the last 72 hours.  Invalid input(s): FREET3  Other results:  Imaging  No results found.  Medications:   Scheduled Medications:  aspirin  EC  325 mg Oral Daily   atorvastatin   40 mg Oral QHS   docusate sodium   100 mg Oral BID   enoxaparin  (LOVENOX ) injection  40 mg Subcutaneous Q24H   gabapentin   100 mg Oral BID   insulin  aspart  0-5 Units Subcutaneous QHS   insulin  aspart  0-9 Units Subcutaneous TID WC   insulin  aspart  8  Units Subcutaneous TID WC   insulin  glargine-yfgn  24 Units Subcutaneous BID   metoprolol  succinate  25 mg Oral Daily   potassium chloride   20 mEq Oral Daily   sacubitril -valsartan   1 tablet Oral BID   sodium chloride  flush  3 mL Intravenous Q12H   spironolactone   25 mg Oral Daily   torsemide   40 mg Oral Daily    Infusions:   ceFAZolin  (ANCEF ) IV 1 g (10/13/23 0616)    PRN Medications: acetaminophen  **OR** acetaminophen , ondansetron  (ZOFRAN ) IV, oxyCODONE  Patient Profile   58 y.o. male with history of HFrEF, HTN, T2DM, CKD 2, and aortic stenosis s/p bioprothestic AVR with Dr. Kerrin on 09/22/23.   Assessment/Plan  1. Acute on chronic systolic CHF: Nonischemic cardiomyopathy.  ?Valvular related to  severe AS versus LBBB cardiomyopathy.  Cath in pre-AVR showed no significant CAD but markedly elevated filling pressures.  Echo in 6/25 (pre-op) showed EF 20-25% with septal-lateral dyssynchrony, normal RV function, severe aortic stenosis.  Patient is now s/p bioprosthetic AVR.  Intra-op TEE reported EF 50%. 2D echo post-op, 10/06/23 echo showed EF 45-50%, significant LV dyssynchrony, LV with GHK, RV mildly reduced, LA mildly dilated, trivial MR, normal structure and function of AV prosthesis - volume ok on exam. Will hold Torsemide  today with AKI. Plan to restart tomorrow at decreased dose 20 mg daily.   - Continue spiro 25 mg daily  - Continue Toprol  XL 25 mg daily - Continue Entresto  24-26 mg BID. BP too soft to titrate  - Allergy to farxiga  and invokana- syncope/dizziness.  - Renal function stable.  - Would benefit from CRT-D give LBBB if EF remains low a few months after AVR and medical optimization.   2. Aortic stenosis: S/p bioprosthetic AVR in 6/25.  - Normal structure and function of AV prosthesis. AV mean gradient 8 mmHG  3. AKI on CKD stage 3: Creatinine baseline ~ 1.5-1.7 - SCr up to 1.56 today. Holding diuretics as above.   4. Venous stasis ulcers: R and LLE wounds. WOC consulted.  - Continues on IV cefazolin  6/7 today. Hopefully home when IV abx complete.  - management per primary team    Length of Stay: 8  Beckey LITTIE Coe, NP  10/13/2023, 8:37 AM  Advanced Heart Failure Team Pager [phone] (M-F; 7a - 5p)  Please contact [facility] for night-coverage after hours (5p -7a ) and weekends on [email or web address]  Patient seen with NP, I formulated the plan and agree with the above note.   Patient went into atrial fibrillation with RVR this morning, HR in 120s in AF when I first saw him then later in morning converted on his own back to NSR.  He did not feel the atrial fibrillation.  He is currently in NSR.    Creatinine up today to 1.56, SBP 100s (but also in setting of AF).   No  dyspnea, has been walking.   General: NAD Neck: No JVD, no thyromegaly or thyroid nodule.  Lungs: Clear to auscultation bilaterally with normal respiratory effort. CV: Nondisplaced PMI.  Heart tachy, irregular S1/S2, no S3/S4, no murmur.  Trace ankle edema.   Abdomen: Soft, nontender, no hepatosplenomegaly, no distention.  Skin: Intact without lesions or rashes.  Neurologic: Alert and oriented x 3.  Psych: Normal affect. Extremities: No clubbing or cyanosis. LE ulcers wrapped.  HEENT: Normal.   New atrial fibrillation with RVR.  He is now out several weeks from AVR.  I think he will need to be anticoagulated.  - Decrease  ASA to 81 mg daily.  - Start Eliquis  5 mg bid.  - I will start him on amiodarone  200 mg bid to try to maintain NSR.   He does not look volume overloaded on exam and creatinine up to 1.5 today.  Stop torsemide  for now, resume lower dose when creatinine trends down.  I will also hold his morning dose of Entresto  and resume in afternoon.   He is on day 6/7 of cefazolin  for LE ulcerations and possible cellulitis.   Ezra Shuck 10/13/2023 10:03 AM

## 2023-10-13 NOTE — Telephone Encounter (Signed)
 Patient Product/process development scientist completed.    The patient is insured through [employer]. Patient has [employer], may use a copay card, and/or apply for patient assistance if available.    Ran test claim for Eliquis  5 mg and the current 30 day co-pay is $150.00.   This test claim was processed through [facility]- copay amounts may vary at other pharmacies due to pharmacy/plan contracts, or as the patient moves through the different stages of their insurance plan.     Reyes Sharps, CPHT Pharmacy Technician III Certified Patient Advocate [facility] Patient Advocate Team Direct Number: [phone]  Fax: [phone]

## 2023-10-14 ENCOUNTER — Other Ambulatory Visit (HOSPITAL_COMMUNITY): Payer: Self-pay

## 2023-10-14 ENCOUNTER — Telehealth (HOSPITAL_COMMUNITY): Payer: Self-pay | Admitting: Pharmacy Technician

## 2023-10-14 DIAGNOSIS — I4891 Unspecified atrial fibrillation: Secondary | ICD-10-CM

## 2023-10-14 DIAGNOSIS — I4892 Unspecified atrial flutter: Secondary | ICD-10-CM

## 2023-10-14 DIAGNOSIS — Z952 Presence of prosthetic heart valve: Secondary | ICD-10-CM | POA: Diagnosis not present

## 2023-10-14 LAB — BASIC METABOLIC PANEL WITH GFR
Anion gap: 6 (ref 5–15)
BUN: 38 mg/dL — ABNORMAL HIGH (ref 6–20)
CO2: 29 mmol/L (ref 22–32)
Calcium: 8.8 mg/dL — ABNORMAL LOW (ref 8.9–10.3)
Chloride: 103 mmol/L (ref 98–111)
Creatinine, Ser: 1.15 mg/dL (ref 0.61–1.24)
GFR, Estimated: >60 mL/min (ref >60)
Glucose, Bld: 88 mg/dL (ref 70–99)
Potassium: 4.3 mmol/L (ref 3.5–5.1)
Sodium: 138 mmol/L (ref 135–145)

## 2023-10-14 LAB — GLUCOSE, CAPILLARY
Glucose-Capillary: 78 mg/dL (ref 70–99)
Glucose-Capillary: 225 mg/dL — ABNORMAL HIGH (ref 70–99)
Glucose-Capillary: 152 mg/dL — ABNORMAL HIGH (ref 70–99)
Glucose-Capillary: 136 mg/dL — ABNORMAL HIGH (ref 70–99)

## 2023-10-14 LAB — CBC
HCT: 34.8 % — ABNORMAL LOW (ref 39.0–52.0)
Hemoglobin: 11.5 g/dL — ABNORMAL LOW (ref 13.0–17.0)
MCH: 28.3 pg (ref 26.0–34.0)
MCHC: 33 g/dL (ref 30.0–36.0)
MCV: 85.5 fL (ref 80.0–100.0)
Platelets: 337 K/uL (ref 150–400)
RBC: 4.07 MIL/uL — ABNORMAL LOW (ref 4.22–5.81)
RDW: 12.7 % (ref 11.5–15.5)
WBC: 8.6 K/uL (ref 4.0–10.5)
nRBC: 0 % (ref 0.0–0.2)

## 2023-10-14 MED ORDER — GABAPENTIN 100 MG PO CAPS: 100.0000 mg | ORAL_CAPSULE | Freq: Two times a day (BID) | ORAL | Status: DC

## 2023-10-14 MED ORDER — SACUBITRIL-VALSARTAN 24-26 MG PO TABS
1.0000 | ORAL_TABLET | Freq: Two times a day (BID) | ORAL | 1 refills | Status: DC
  Filled 2023-10-14: qty 60, 30d supply, fill #0

## 2023-10-14 MED ORDER — AMIODARONE HCL 200 MG PO TABS
200.0000 mg | ORAL_TABLET | Freq: Two times a day (BID) | ORAL | 1 refills | Status: DC
  Filled 2023-10-14: qty 60, 30d supply, fill #0

## 2023-10-14 MED ORDER — OXYCODONE HCL 5 MG PO TABS
5.0000 mg | ORAL_TABLET | Freq: Two times a day (BID) | ORAL | 0 refills | Status: AC | PRN
  Filled 2023-10-14: qty 14, 7d supply, fill #0

## 2023-10-14 MED ORDER — TORSEMIDE 20 MG PO TABS
20.0000 mg | ORAL_TABLET | Freq: Every day | ORAL | Status: DC
  Administered 2023-10-14 – 2023-10-15 (×2): 20 mg via ORAL
  Filled 2023-10-14 (×2): qty 1

## 2023-10-14 MED ORDER — TORSEMIDE 20 MG PO TABS
20.0000 mg | ORAL_TABLET | Freq: Every day | ORAL | 1 refills | Status: DC
  Filled 2023-10-14: qty 30, 30d supply, fill #0

## 2023-10-14 MED ORDER — SPIRONOLACTONE 25 MG PO TABS
25.0000 mg | ORAL_TABLET | Freq: Every day | ORAL | 1 refills | Status: DC
  Filled 2023-10-14: qty 30, 30d supply, fill #0

## 2023-10-14 MED ORDER — APIXABAN 5 MG PO TABS
5.0000 mg | ORAL_TABLET | Freq: Two times a day (BID) | ORAL | 1 refills | Status: DC
  Filled 2023-10-14: qty 60, 30d supply, fill #0

## 2023-10-14 MED ORDER — ASPIRIN 81 MG PO CHEW: 81.0000 mg | CHEWABLE_TABLET | Freq: Every day | ORAL | Status: AC

## 2023-10-14 NOTE — TOC Progression Note (Addendum)
 Transition of Care [facility]) - Progression Note    Patient Details  Name: Jonathon Bray MRN: [id number] Date of Birth: [birth year]  [facility] CM/SW Contact  Justina Delcia Czar, RN Phone Number: [phone] 10/14/2023, 4:59 PM  Clinical Narrative:    TOC CM spoke to pt and agreeable to wound care center [facility] [phone] Fax: [phone]. Attempted contact to Wound care center and they are closed on Friday. Unable to locate [facility] RN for dressing changes to legs. If dc on weekend, pt will need additional material to complete dressing changes for a few days until able to schedule with wound care center on Monday.   Will send attending a message to add to dc summary or send ambulatory referral for wound care at [facility].  Pt states has scale to do daily weights, and Living Better with HF booklet. Discussed with pt low sodium/heart healthy diet.  Wife will provide transportation home.    Expected Discharge Plan: Home w Home Health Services Barriers to Discharge: Continued Medical Work up  Expected Discharge Plan and Services   Discharge Planning Services: CM Consult   Living arrangements for the past 2 months: Single Family Home                                       Social Determinants of Health (SDOH) Interventions SDOH Screenings   Food Insecurity: No Food Insecurity (10/05/2023)  Housing: Low Risk  (10/05/2023)  Transportation Needs: No Transportation Needs (10/05/2023)  Utilities: Not At Risk (10/05/2023)  Social Connections: Unknown (08/17/2021)   Received from [facility]  Tobacco Use: Low Risk  (10/11/2023)    Readmission Risk Interventions    09/28/2023    2:07 PM  Readmission Risk Prevention Plan  Transportation Screening Complete  PCP or Specialist Appt within 5-7 Days Complete  Home Care Screening Complete  Medication Review (RN CM) Complete

## 2023-10-14 NOTE — Progress Notes (Addendum)
  Subjective: Feels well  Objective: Vital signs in last 24 hours: Temp:  [97.5 F (36.4 C)-98.2 F (36.8 C)] 97.8 F (36.6 C) (07/11 0300) Pulse Rate:  [82-92] 83 (07/11 0300) Cardiac Rhythm: Sinus tachycardia;Bundle branch block (07/10 1920) Resp:  [12-23] 12 (07/11 0300) BP: (102-120)/(68-76) 112/75 (07/11 0300) SpO2:  [95 %-98 %] 96 % (07/11 0300) Weight:  [89.4 kg] 89.4 kg (07/11 0500)  Hemodynamic parameters for last 24 hours:    Intake/Output from previous day: 07/10 0701 - 07/11 0700 In: 390 [P.O.:240; IV Piggyback:150] Out: 300 [Urine:300] Intake/Output this shift: No intake/output data recorded.  General appearance: alert, cooperative, and no distress Heart: regular rate and rhythm Lungs: clear to auscultation bilaterally Abdomen: benign Extremities: min edema LE's Wound: incis /wounds stable, cellulitis appears resolved  Lab Results: Recent Labs    10/13/23 0311 10/14/23 0316  WBC 10.0 8.6  HGB 11.9* 11.5*  HCT 35.7* 34.8*  PLT 334 337   BMET:  Recent Labs    10/13/23 0311 10/14/23 0316  NA 138 138  K 4.5 4.3  CL 99 103  CO2 31 29  GLUCOSE 131* 88  BUN 39* 38*  CREATININE 1.56* 1.15  CALCIUM  8.8* 8.8*    PT/INR: No results for input(s): LABPROT, INR in the last 72 hours. ABG    Component Value Date/Time   PHART 7.260 (L) 09/22/2023 1758   HCO3 20.2 09/22/2023 1758   TCO2 22 09/22/2023 1758   ACIDBASEDEF 7.0 (H) 09/22/2023 1758   O2SAT 91 09/22/2023 1758   CBG (last 3)  Recent Labs    10/13/23 1657 10/13/23 2054 10/14/23 0558  GLUCAP 168* 185* 78    Meds Scheduled Meds:  amiodarone   200 mg Oral BID   apixaban   5 mg Oral BID   aspirin   81 mg Oral Daily   atorvastatin   40 mg Oral QHS   docusate sodium   100 mg Oral BID   gabapentin   100 mg Oral BID   insulin  aspart  0-5 Units Subcutaneous QHS   insulin  aspart  0-9 Units Subcutaneous TID WC   insulin  aspart  8 Units Subcutaneous TID WC   insulin  glargine-yfgn  24 Units  Subcutaneous BID   metoprolol  succinate  25 mg Oral Daily   potassium chloride   20 mEq Oral Daily   sacubitril -valsartan   1 tablet Oral BID   sodium chloride  flush  3 mL Intravenous Q12H   spironolactone   25 mg Oral Daily   Continuous Infusions:   ceFAZolin  (ANCEF ) IV 1 g (10/14/23 0646)   PRN Meds:.acetaminophen  **OR** acetaminophen , ondansetron  (ZOFRAN ) IV, oxyCODONE   Xrays No results found.  Assessment/Plan:  1 afeb, VSS, has had afib, cards started on amiodarone  and eliquis  2 O2 sats good on RA 3 voiding- not all  measured, weight fairly stab;e fpr several days 4 BS variable, 78-178 range 5 creat improved to normal range, BUN 38- demedex held yesterday 6 no  leukocytosis  7 H/H stable 8 stable from surgical perspective- will see what cardiology recommends in terms of home diuretic/GDMT, may want to observe further since just started on amio for Afib.      LOS: 9 days    Lemond FORBES Cera PA-C Pager [phone]  10/14/2023 Patient seen and examined, agree with above Some A fib/ flutter with RVR- started on PO amio and Eliquis  Home tomorrow Am if no further rhythm issues  Elspeth C. Kerrin, MD [facility] [phone]

## 2023-10-14 NOTE — Telephone Encounter (Signed)
 Pharmacy Patient Advocate Encounter  Received notification from [facility] that Prior Authorization for oxyCODONE  HCl 5MG  tablets  has been APPROVED from 10/14/2023 to 10/21/2023   PA #/Case ID/Reference #: [id number]

## 2023-10-14 NOTE — Plan of Care (Signed)

## 2023-10-14 NOTE — Telephone Encounter (Signed)
 Pharmacy Patient Advocate Encounter   Received notification from Inpatient Request that prior authorization for oxyCODONE  HCl 5MG  tablets is required/requested.   Insurance verification completed.   The patient is insured through [employer] .   Per test claim: PA required; PA submitted to above mentioned insurance via CoverMyMeds Key/confirmation #/EOC [id number] Status is pending

## 2023-10-14 NOTE — Progress Notes (Addendum)
 Advanced Heart Failure Rounding Note  Cardiologist: Madonna Large, DO  Chief Complaint: Heart Failure  Subjective:    Wt stable. Diuretics held yesterday with rise in SCr. SCr 1.18>1.56>1.15 today. Weight up 2lbs. SBPs low 100s.   IV abx complete today for leg wounds.   Feels good this morning. Wife at bedside.   Objective:   Weight Range: 89.4 kg Body mass index is 32.8 kg/m.   Vital Signs:   Temp:  [97.5 F (36.4 C)-98.2 F (36.8 C)] 97.7 F (36.5 C) (07/11 0802) Pulse Rate:  [78-92] 78 (07/11 0802) Resp:  [12-23] 16 (07/11 0802) BP: (102-130)/(68-82) 130/82 (07/11 0802) SpO2:  [95 %-99 %] 99 % (07/11 0802) Weight:  [89.4 kg] 89.4 kg (07/11 0500) Last BM Date : 10/12/23  Weight change: Filed Weights   10/12/23 0500 10/13/23 0500 10/14/23 0500  Weight: 88.2 kg 88.5 kg 89.4 kg   Intake/Output:   Intake/Output Summary (Last 24 hours) at 10/14/2023 0811 Last data filed at 10/14/2023 9390 Gross per 24 hour  Intake 390 ml  Output --  Net 390 ml    Physical Exam  General:  well appearing.  No respiratory difficulty.  Sitting on EOB.  Neck: supple. JVD flat.  Cor: PMI nondisplaced. Regular rate & rhythm. No rubs, gallops or murmurs. Lungs: clear Extremities: no cyanosis, clubbing, rash, edema. Both legs wrapped.  Neuro: alert & oriented x 3. Moves all 4 extremities w/o difficulty. Affect pleasant.   Telemetry   NSR 90s  (Personally reviewed)    EKG    N/A  Labs    CBC Recent Labs    10/13/23 0311 10/14/23 0316  WBC 10.0 8.6  HGB 11.9* 11.5*  HCT 35.7* 34.8*  MCV 85.6 85.5  PLT 334 337   Basic Metabolic Panel Recent Labs    92/89/74 0311 10/14/23 0316  NA 138 138  K 4.5 4.3  CL 99 103  CO2 31 29  GLUCOSE 131* 88  BUN 39* 38*  CREATININE 1.56* 1.15  CALCIUM  8.8* 8.8*  MG 2.0  --    Liver Function Tests No results for input(s): AST, ALT, ALKPHOS, BILITOT, PROT, ALBUMIN  in the last 72 hours.  No results for input(s):  LIPASE, AMYLASE in the last 72 hours. Cardiac Enzymes No results for input(s): CKTOTAL, CKMB, CKMBINDEX, TROPONINI in the last 72 hours.  BNP: BNP (last 3 results) Recent Labs    09/10/23 2142 09/16/23 1722 10/05/23 2003  BNP 440.4* 376.4* 459.1*   ProBNP (last 3 results) Recent Labs    06/06/23 1633 06/28/23 1402 07/27/23 1314  PROBNP 1,093* 1,172* 1,063*   D-Dimer No results for input(s): DDIMER in the last 72 hours. Hemoglobin A1C No results for input(s): HGBA1C in the last 72 hours. Fasting Lipid Panel No results for input(s): CHOL, HDL, LDLCALC, TRIG, CHOLHDL, LDLDIRECT in the last 72 hours. Thyroid Function Tests No results for input(s): TSH, T4TOTAL, T3FREE, THYROIDAB in the last 72 hours.  Invalid input(s): FREET3  Other results:  Imaging  No results found.  Medications:   Scheduled Medications:  amiodarone   200 mg Oral BID   apixaban   5 mg Oral BID   aspirin   81 mg Oral Daily   atorvastatin   40 mg Oral QHS   docusate sodium   100 mg Oral BID   gabapentin   100 mg Oral BID   insulin  aspart  0-5 Units Subcutaneous QHS   insulin  aspart  0-9 Units Subcutaneous TID WC   insulin  aspart  8 Units  Subcutaneous TID WC   insulin  glargine-yfgn  24 Units Subcutaneous BID   metoprolol  succinate  25 mg Oral Daily   potassium chloride   20 mEq Oral Daily   sacubitril -valsartan   1 tablet Oral BID   sodium chloride  flush  3 mL Intravenous Q12H   spironolactone   25 mg Oral Daily    Infusions:   ceFAZolin  (ANCEF ) IV 1 g (10/14/23 0646)    PRN Medications: acetaminophen  **OR** acetaminophen , ondansetron  (ZOFRAN ) IV, oxyCODONE  Patient Profile   58 y.o. male with history of HFrEF, HTN, T2DM, CKD 2, and aortic stenosis s/p bioprothestic AVR with Dr. Kerrin on 09/22/23.   Assessment/Plan  1. Acute on chronic systolic CHF: Nonischemic cardiomyopathy.  ?Valvular related to severe AS versus LBBB cardiomyopathy.  Cath in pre-AVR  showed no significant CAD but markedly elevated filling pressures.  Echo in 6/25 (pre-op) showed EF 20-25% with septal-lateral dyssynchrony, normal RV function, severe aortic stenosis.  Patient is now s/p bioprosthetic AVR.  Intra-op TEE reported EF 50%. 2D echo post-op, 10/06/23 echo showed EF 45-50%, significant LV dyssynchrony, LV with GHK, RV mildly reduced, LA mildly dilated, trivial MR, normal structure and function of AV prosthesis - volume ok on exam. Diuretics held yesterday with bump in SCr, now back to baseline. Restart Torsemide  20 mg daily.   - Continue spiro 25 mg daily  - Continue Toprol  XL 25 mg daily - Continue Entresto  24-26 mg BID. BP too soft to titrate  - Allergy to farxiga  and invokana- syncope/dizziness.  - Renal function stable.  - Would benefit from CRT-D give LBBB if EF remains low a few months after AVR and medical optimization.   2. Aortic stenosis: S/p bioprosthetic AVR in 6/25.  - Normal structure and function of AV prosthesis. AV mean gradient 8 mmHG  3. AKI on CKD stage 3: Creatinine baseline ~ 1.5-1.7 - SCr back down. 1.15 this morning after holding diuretics yesterday.   4. Venous stasis ulcers: R and LLE wounds. WOC consulted.  - Continues on IV cefazolin  7/7 today. Hopefully home when IV abx complete.  - management per primary team   5. A fib RVR - New this admission - Continue eliquis  5 mg BID - Continue amiodarone  200 mg BID - in NSR this morning.  - Will need EKG at [facility].   Appears stable for discharge from AHF standpoint, pending MD eval. Will arrange close f/u in AHF clinic. Please send meds to [facility] pharmacy.   AHF meds at discharge:  - Amiodarone  200 mg BID x 10 days then 200 mg daily (plan to wean at f/u) - Eliquis  5 mg BID - ASA 80 mg daily - Atorvastatin  40 mg at bedtime - toprol  XL 25 mg daily - Entresto  24-26 mg BID - Spironolactone  25 mg daily - Torsemide  20 mg daily   Length of Stay: 9  Beckey LITTIE Coe, NP  10/14/2023, 8:11  AM  Advanced Heart Failure Team Pager [phone] (M-F; 7a - 5p)  Please contact [facility] for night-coverage after hours (5p -7a ) and weekends on [email or web address]  Patient seen with NP, I formulated the plan and agree with the above note.   Creatinine lower today at 1.15, BP 130/82. No further atrial fibrillation.   Feels good, no dyspnea.   General: NAD Neck: No JVD, no thyromegaly or thyroid nodule.  Lungs: Clear to auscultation bilaterally with normal respiratory effort. CV: Nondisplaced PMI.  Heart regular S1/S2, no S3/S4, no murmur.  Trace peripheral edema.    Abdomen: Soft, nontender,  no hepatosplenomegaly, no distention.  Skin: Intact without lesions or rashes.  Neurologic: Alert and oriented x 3.  Psych: Normal affect. Extremities: No clubbing or cyanosis. LE wounds healing.  HEENT: Normal.   He has completed IV abx for possible cellulitis.   No further atrial fibrillation noted.  Continue amiodarone  200 bid x 10 days then 200 daily.  Will stop amiodarone  after a month or so if no further AF.  Continue Eliquis .   Volume status looks ok.  Can resume torsemide  at lower dose 20 mg daily today with improved creatinine 1.15.  Continue other GDMT.   He can go home today with close followup in CHF clinic.  Meds as above.  Would get BMET next week.   Ezra Shuck 10/14/2023 10:37 AM

## 2023-10-14 NOTE — Progress Notes (Signed)
 OT Cancellation Note  Patient Details Name: Jonathon Bray MRN: [id number] DOB: [birth year]   Cancelled Treatment:    Reason Eval/Treat Not Completed: Other (comment) Pt expressing frustration with not discharging home as planned today. Politely declined to engage in OOB activity with OT. Wife at bedside, endorses ability to assist pt as needed and pt reporting much improved endurance/independence. No further skilled OT services needed at this time.  Maraki Macquarrie 10/14/2023, 1:57 PM

## 2023-10-15 DIAGNOSIS — Z952 Presence of prosthetic heart valve: Secondary | ICD-10-CM | POA: Diagnosis not present

## 2023-10-15 LAB — BASIC METABOLIC PANEL WITH GFR
Anion gap: 9 (ref 5–15)
BUN: 37 mg/dL — ABNORMAL HIGH (ref 6–20)
CO2: 28 mmol/L (ref 22–32)
Calcium: 8.6 mg/dL — ABNORMAL LOW (ref 8.9–10.3)
Chloride: 98 mmol/L (ref 98–111)
Creatinine, Ser: 1.19 mg/dL (ref 0.61–1.24)
GFR, Estimated: >60 mL/min (ref >60)
Glucose, Bld: 125 mg/dL — ABNORMAL HIGH (ref 70–99)
Potassium: 4.2 mmol/L (ref 3.5–5.1)
Sodium: 135 mmol/L (ref 135–145)

## 2023-10-15 LAB — CBC
HCT: 35.3 % — ABNORMAL LOW (ref 39.0–52.0)
Hemoglobin: 11.7 g/dL — ABNORMAL LOW (ref 13.0–17.0)
MCH: 28.3 pg (ref 26.0–34.0)
MCHC: 33.1 g/dL (ref 30.0–36.0)
MCV: 85.3 fL (ref 80.0–100.0)
Platelets: 324 K/uL (ref 150–400)
RBC: 4.14 MIL/uL — ABNORMAL LOW (ref 4.22–5.81)
RDW: 12.9 % (ref 11.5–15.5)
WBC: 8.7 K/uL (ref 4.0–10.5)
nRBC: 0 % (ref 0.0–0.2)

## 2023-10-15 LAB — GLUCOSE, CAPILLARY: Glucose-Capillary: 103 mg/dL — ABNORMAL HIGH (ref 70–99)

## 2023-10-15 NOTE — Progress Notes (Signed)
 Advanced Heart Failure Rounding Note  Cardiologist: Madonna Large, DO  Chief Complaint: Heart Failure  Subjective:    Breathing good   Eager to go home   Objective:   Weight Range: 89 kg Body mass index is 32.67 kg/m.   Vital Signs:   Temp:  [97.7 F (36.5 C)-98.1 F (36.7 C)] 97.9 F (36.6 C) (07/12 0300) Pulse Rate:  [76-86] 76 (07/12 0300) Resp:  [16-18] 17 (07/12 0300) BP: (102-130)/(69-82) 112/73 (07/12 0300) SpO2:  [96 %-100 %] 98 % (07/12 0300) Weight:  [89 kg] 89 kg (07/12 0500) Last BM Date : 10/14/23  Weight change: Filed Weights   10/13/23 0500 10/14/23 0500 10/15/23 0500  Weight: 88.5 kg 89.4 kg 89 kg   Intake/Output:   Intake/Output Summary (Last 24 hours) at 10/15/2023 0640 Last data filed at 10/15/2023 0300 Gross per 24 hour  Intake 550 ml  Output --  Net 550 ml    Physical Exam  General:  Pt in NAD  Laying flat in bed  Neck: JVP is normal  Cor:RRR  No murmurs  Lungs: clear Extremities: Tr  edema  Lower legs with bandages   Telemetry   NSR80s   (Personally reviewed)    EKG    N/A  Labs    CBC Recent Labs    10/14/23 0316 10/15/23 0321  WBC 8.6 8.7  HGB 11.5* 11.7*  HCT 34.8* 35.3*  MCV 85.5 85.3  PLT 337 324   Basic Metabolic Panel Recent Labs    92/89/74 0311 10/14/23 0316 10/15/23 0321  NA 138 138 135  K 4.5 4.3 4.2  CL 99 103 98  CO2 31 29 28   GLUCOSE 131* 88 125*  BUN 39* 38* 37*  CREATININE 1.56* 1.15 1.19  CALCIUM  8.8* 8.8* 8.6*  MG 2.0  --   --    Liver Function Tests No results for input(s): AST, ALT, ALKPHOS, BILITOT, PROT, ALBUMIN  in the last 72 hours.  No results for input(s): LIPASE, AMYLASE in the last 72 hours. Cardiac Enzymes No results for input(s): CKTOTAL, CKMB, CKMBINDEX, TROPONINI in the last 72 hours.  BNP: BNP (last 3 results) Recent Labs    09/10/23 2142 09/16/23 1722 10/05/23 2003  BNP 440.4* 376.4* 459.1*   ProBNP (last 3 results) Recent Labs     06/06/23 1633 06/28/23 1402 07/27/23 1314  PROBNP 1,093* 1,172* 1,063*   D-Dimer No results for input(s): DDIMER in the last 72 hours. Hemoglobin A1C No results for input(s): HGBA1C in the last 72 hours. Fasting Lipid Panel No results for input(s): CHOL, HDL, LDLCALC, TRIG, CHOLHDL, LDLDIRECT in the last 72 hours. Thyroid Function Tests No results for input(s): TSH, T4TOTAL, T3FREE, THYROIDAB in the last 72 hours.  Invalid input(s): FREET3  Other results:  Imaging  No results found.  Medications:   Scheduled Medications:  amiodarone   200 mg Oral BID   apixaban   5 mg Oral BID   aspirin   81 mg Oral Daily   atorvastatin   40 mg Oral QHS   docusate sodium   100 mg Oral BID   gabapentin   100 mg Oral BID   insulin  aspart  0-5 Units Subcutaneous QHS   insulin  aspart  0-9 Units Subcutaneous TID WC   insulin  aspart  8 Units Subcutaneous TID WC   insulin  glargine-yfgn  24 Units Subcutaneous BID   metoprolol  succinate  25 mg Oral Daily   potassium chloride   20 mEq Oral Daily   sacubitril -valsartan   1 tablet Oral BID  sodium chloride  flush  3 mL Intravenous Q12H   spironolactone   25 mg Oral Daily   torsemide   20 mg Oral Daily    Infusions:   ceFAZolin  (ANCEF ) IV 1 g (10/15/23 0613)    PRN Medications: acetaminophen  **OR** acetaminophen , ondansetron  (ZOFRAN ) IV, oxyCODONE  Patient Profile   58 y.o. male with history of HFrEF, HTN, T2DM, CKD 2, and aortic stenosis s/p bioprothestic AVR with Dr. Kerrin on 09/22/23.   Assessment/Plan  1. Acute on chronic systolic CHF: Nonischemic cardiomyopathy.  ?Valvular related to severe AS versus LBBB cardiomyopathy.  Cath in pre-AVR showed no significant CAD but markedly elevated filling pressures.  Echo in 6/25 (pre-op) showed EF 20-25% with septal-lateral dyssynchrony, normal RV function, severe aortic stenosis.  Patient is now s/p bioprosthetic AVR.  Intra-op TEE reported EF 50%. 2D echo post-op, 10/06/23 echo  showed EF 45-50%, significant LV dyssynchrony, LV with GHK, RV mildly reduced, LA mildly dilated, trivial MR, normal structure and function of AV prosthesis - PT is back on torsemide  po - Continue spiro 25 mg daily  - Continue Toprol  XL 25 mg daily - Continue Entresto  24-26 mg BID. BP too soft to titrate  - Allergy to farxiga  and invokana- syncope/dizziness.  - Renal function remains stable.  - Would benefit from CRT-D give LBBB if EF remains low a few months after AVR and medical optimization.   Will confirm appt in AHF clinic   2. Aortic stenosis: S/p bioprosthetic AVR in 6/25.  - Normal structure and function of AV prosthesis. AV mean gradient 8 mmHG  3. AKI on CKD stage 3: Creatinine baseline ~ 1.5-1.7 - SCr back down. 1.19 Will follow as outpt   4. Venous stasis ulcers: R and LLE wounds. WOC consulted.  - Continues on IV cefazolin  7/7 today. Hopefully home when IV abx complete.  - management per primary team   5. A fib RVR - New this admission  Currently in SR   - Continue eliquis  5 mg BID - Continue amiodarone  200 mg BID  - Will need EKG at [facility].     AHF meds at discharge:  - Amiodarone  200 mg BID x 10 days then 200 mg daily (plan to wean at f/u) - Eliquis  5 mg BID - ASA 80 mg daily - Atorvastatin  40 mg at bedtime - toprol  XL 25 mg daily - Entresto  24-26 mg BID - Spironolactone  25 mg daily - Torsemide  20 mg daily   Length of Stay: 10  Vina Gull, MD  10/15/2023, 6:40 AM

## 2023-10-15 NOTE — Discharge Summary (Addendum)
 [address] [place] [address]             [phone]        Physician Discharge Summary  Patient ID: Jonathon Bray MRN: [id number] DOB/AGE: 1966-01-28 58 y.o.  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   Nonrheumatic aortic valve stenosis 05/11/2022   Family history of premature CAD 05/11/2022   Atherosclerosis of aorta (HCC) 05/11/2022   Aortic dilatation (HCC) 05/11/2022   Hyperlipidemia associated with type 2 diabetes mellitus (HCC) 05/11/2022   Benign hypertension 05/11/2022   Non-ST elevation (NSTEMI) myocardial infarction (HCC) 05/10/2022   Hypercholesteremia 05/10/2022   Type 2 diabetes mellitus with complication, with long-term current use of insulin  (HCC) 05/10/2022   NSTEMI (non-ST elevated myocardial infarction) (HCC) 05/10/2022   Abnormal cardiac enzyme level 05/10/2022   Spondylolysis, lumbar region 01/01/2021   Solitary pulmonary nodule on lung CT 04/02/2020   Orthostatic dizziness 01/24/2014   Primary hypertension 01/24/2014   Diabetes mellitus (HCC) 06/01/2012     Discharge Diagnoses:  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   Nonrheumatic aortic valve stenosis 05/11/2022   Family history of premature CAD 05/11/2022   Atherosclerosis of aorta (HCC) 05/11/2022   Aortic dilatation (HCC) 05/11/2022   Hyperlipidemia associated with type 2 diabetes mellitus (HCC) 05/11/2022    Benign hypertension 05/11/2022   Non-ST elevation (NSTEMI) myocardial infarction (HCC) 05/10/2022   Hypercholesteremia 05/10/2022   Type 2 diabetes mellitus with complication, with long-term current use of insulin  (HCC) 05/10/2022   NSTEMI (non-ST elevated myocardial infarction) (HCC) 05/10/2022   Abnormal cardiac enzyme level 05/10/2022   Spondylolysis, lumbar region 01/01/2021   Solitary pulmonary nodule on lung CT 04/02/2020   Orthostatic dizziness 01/24/2014   Primary hypertension 01/24/2014   Diabetes mellitus (HCC) 06/01/2012     Discharged Condition: stable  History of Presenting Illness:  Jonathon Bray is a 58 year old man with a history of hypertension, type 2 insulin -dependent diabetes, stage II chronic kidney disease, 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 Kerrin earlier this year for management of severe aortic stenosis.  He ultimately had bioprosthetic aortic valve replacement on 09/22/2023 by Dr. Kerrin with a 23 mm Edwards Inspiris Resilia pericardial tissue valve.  The postoperative course was notable for silent aspiration early after surgery with improvement of his swallowing function by the time of his discharge.  He also had acute on chronic renal insufficiency postoperatively with a peak creatinine of 1.73.  He was diuresed aggressively postoperatively for significant volume excess with torsemide .  He was discharged on torsemide  with Unna boots in place to mitigate the lower extremity edema.  We attempted to arrange home health nursing along with home physical and occupational therapies but we could not get an agency to agree to come to his home located in [place] .  He was discharged home on 09/28/2023 and asked to return today for a postoperative checkup with Bmet  and to evaluate whether or not he needs to continue use of the Unna boots.   Mr. Fugate presented to the office today with his wife.  Overall, he feels like he is  making progress since his discharge to home.  He has been taking medications as prescribed and notes that he is lost a lot of water weight.  He had no difficulty swallowing since his discharge.  From an activity standpoint, he is primarily just been walking around inside the house.  He has not had any significant shortness of breath and the parasternal pain has been well-controlled.  He did develop a lot of discomfort related to the Unna boots and he took them off himself 2 days ago.  Since then, he has had persistent watery drainage from blisters on the anterior surface of both shins.  Dr. Lucas determined the patient would benefit from readmission. The patient was admitted to [facility] on 10/06/23.  Hospital Course: Upon admission the advanced heart failure team was consulted as well as wound care. Unna boots were replaced but they were removed about 3 hours later due to discomfort. His Toprol  XL 25mg  was continued and he was started on aggressive IV Lasix  80mg  BID per AHF team. Spironolactone  25mg  daily was also added. Echocardiogram was ordered and showed LV EF of 40-45% and appropriate function of the prosthetic aortic valve. Venous duplex studies on bilateral legs were ordered and showed no sign of DVT. He remained hyperglycemic, Semglee  and novolog  were increased, diabetes coordinator was consulted. He has a history of CKD stage 3, creatinine remained stable.  He has done well in terms of his diuresis significant improvement of lower extremity edema.  He did have significant blistering of some areas and dressing changes have been initiated per wound care recommendations.  He was felt to have early cellulitis and was treated with a course of intravenous Ancef  for 7 days with resolution of erythema. By the time of discharge, the right leg wound was scabbed over and dry with no evidence of retained fluid. The left leg blister had been debrided with dressing changes and exposing a clean, moist  skin layer that was healing with no sign of complication.  Peripheral edema had resolved appetite had improved and he was ambulating independently without difficulty.     Consults: cardiology  Significant Diagnostic Studies:  Lower Venous DVT Study  Patient Name:  NUNO BRUBACHER  Date of Exam:   10/06/2023 Medical Rec #: [id number]          Accession #:    [id number] Date of Birth: [birth year]          Patient Gender: M Patient Age:   14 years Exam Location:  [facility] Procedure:      VAS US  LOWER EXTREMITY VENOUS (DVT) Referring Phys: CON CHAMBERS   --------------------------------------------------------------------------- -----   Indications: Swelling, and Edema. Other Indications: Status post Aortic Valve Repair 09/22/23.  Performing Technologist: Ricka Sturdivant-Jones RDMS, RVT    Examination Guidelines: A complete evaluation includes B-mode imaging, spectral Doppler, color Doppler, and power Doppler as needed of all accessible portions of each vessel. Bilateral testing is considered an integral part of a complete examination. Limited examinations for reoccurring indications may be performed as noted. The reflux portion of the exam is performed with the patient in reverse Trendelenburg.     +---------+---------------+---------+-----------+----------+--------------+  RIGHT    CompressibilityPhasicitySpontaneityPropertiesThrombus Aging +---------+---------------+---------+-----------+----------+--------------+  CFV      Full  Yes      Yes                                  +---------+---------------+---------+-----------+----------+--------------+  SFJ      Full                                                         +---------+---------------+---------+-----------+----------+--------------+  FV Prox  Full                                                          +---------+---------------+---------+-----------+----------+--------------+  FV Mid   Full                                                         +---------+---------------+---------+-----------+----------+--------------+  FV DistalFull                                                         +---------+---------------+---------+-----------+----------+--------------+  PFV      Full                                                         +---------+---------------+---------+-----------+----------+--------------+  POP      Full           Yes      Yes                                  +---------+---------------+---------+-----------+----------+--------------+  PTV      Full                                                         +---------+---------------+---------+-----------+----------+--------------+  PERO     Full                                                         +---------+---------------+---------+-----------+----------+--------------+         +---------+---------------+---------+-----------+----------+--------------+  LEFT     CompressibilityPhasicitySpontaneityPropertiesThrombus Aging +---------+---------------+---------+-----------+----------+--------------+  CFV      Full           Yes      Yes                                  +---------+---------------+---------+-----------+----------+--------------+  SFJ      Full                                                         +---------+---------------+---------+-----------+----------+--------------+  FV Prox  Full                                                         +---------+---------------+---------+-----------+----------+--------------+  FV Mid   Full                                                         +---------+---------------+---------+-----------+----------+--------------+  FV DistalFull                                                          +---------+---------------+---------+-----------+----------+--------------+  PFV      Full                                                         +---------+---------------+---------+-----------+----------+--------------+  POP      Full           Yes      Yes                                  +---------+---------------+---------+-----------+----------+--------------+  PTV      Full                                                         +---------+---------------+---------+-----------+----------+--------------+  PERO     Full                                                         +---------+---------------+---------+-----------+----------+--------------+  Summary: BILATERAL: - No evidence of deep vein thrombosis seen in the lower extremities, bilaterally. -No evidence of popliteal cyst, bilaterally.   *See table(s) above for measurements and observations.     Preliminary      ECHOCARDIOGRAM REPORT       Patient Name:   FABION GATSON Date of Exam: 10/06/2023  Medical Rec #:  [id number]         Height:       65.0 in  Accession #:    [id number]  Weight:       204.1 lb  Date of Birth:  1965-06-12         BSA:          1.995 m  Patient Age:    57 years          BP:           136/78 mmHg  Patient Gender: M                 HR:           85 bpm.  Exam Location:  Inpatient   Procedure: 2D Echo, Cardiac Doppler, Color Doppler and Intracardiac             Opacification Agent (Both Spectral and Color Flow Doppler were             utilized during procedure).   Indications:    Congestive Heart Failure I50.9    History:        Patient has prior history of Echocardiogram examinations,  most                 recent 09/11/2023. Aortic Valve Disease; Risk  Factors:Hypertension                 and Diabetes.                  Aortic Valve: 23 mm INSPIRIS RESILIA AORTIC pericardial  valve is                   present in the aortic position. Procedure Date: 09/22/23.    Sonographer:    Jayson Gaskins  Referring Phys: [id number] S[place] LEE   IMPRESSIONS     1. Cannot exclude hypokinesis of inferior wall, even with use of echo  contrast. Significant LV dyssynchrony noted. Left ventricular ejection  fraction, by estimation, is 45 to 50%. The left ventricle has mildly  decreased function. The left ventricle  demonstrates global hypokinesis. Left ventricular diastolic parameters are  indeterminate.   2. Right ventricular systolic function is mildly reduced. The right  ventricular size is normal.   3. Left atrial size was mildly dilated.   4. The mitral valve is grossly normal. Trivial mitral valve  regurgitation. No evidence of mitral stenosis.   5. The aortic valve has been repaired/replaced. Aortic valve  regurgitation is not visualized. There is a 23 mm INSPIRIS RESILIA AORTIC  pericardial valve present in the aortic position. Procedure Date: 09/22/23.  Echo findings are consistent with normal  structure and function of the aortic valve prosthesis. Aortic valve mean  gradient measures 8.0 mmHg.   Comparison(s): Changes from prior study are noted.   Conclusion(s)/Recommendation(s): S/P surgical AVR since last echo. EF  improved from prior 20-25% to 45-50% with LV dyssynchrony.   FINDINGS   Left Ventricle: Cannot exclude hypokinesis of inferior wall, even with  use of echo contrast. Significant LV dyssynchrony noted. Left ventricular  ejection fraction, by estimation, is 45 to 50%. The left ventricle has  mildly decreased function. The left  ventricle demonstrates global hypokinesis. Definity  contrast agent was  given IV to delineate the left ventricular endocardial borders. The left  ventricular internal cavity size was normal in size. There is borderline  left ventricular hypertrophy.  Abnormal (paradoxical) septal motion, consistent with left bundle branch  block. Left ventricular  diastolic parameters are indeterminate.   Right Ventricle: The right ventricular size is normal. Right vetricular  wall thickness was not  well visualized. Right ventricular systolic  function is mildly reduced.   Left Atrium: Left atrial size was mildly dilated.   Right Atrium: Right atrial size was normal in size.   Pericardium: There is no evidence of pericardial effusion.   Mitral Valve: The mitral valve is grossly normal. Trivial mitral valve  regurgitation. No evidence of mitral valve stenosis.   Tricuspid Valve: The tricuspid valve is not well visualized. Tricuspid  valve regurgitation is trivial. No evidence of tricuspid stenosis.   Aortic Valve: The aortic valve has been repaired/replaced. Aortic valve  regurgitation is not visualized. Aortic valve mean gradient measures 8.0  mmHg. Aortic valve peak gradient measures 13.8 mmHg. Aortic valve area, by  VTI measures 2.39 cm. There is a  23 mm INSPIRIS RESILIA AORTIC pericardial valve present in the aortic  position. Procedure Date: 09/22/23. Echo findings are consistent with  normal structure and function of the aortic valve prosthesis.   Pulmonic Valve: The pulmonic valve was not well visualized. Pulmonic valve  regurgitation is not visualized. No evidence of pulmonic stenosis.   Aorta: The aortic root is normal in size and structure, the ascending  aorta was not well visualized and the aortic arch was not well visualized.   Venous: The inferior vena cava was not well visualized.   IAS/Shunts: The atrial septum is grossly normal.     LEFT VENTRICLE  PLAX 2D  LVIDd:         4.40 cm   Diastology  LVIDs:         3.00 cm   LV e' medial:    5.87 cm/s  LV PW:         1.10 cm   LV E/e' medial:  21.3  LV IVS:        0.90 cm   LV e' lateral:   9.46 cm/s  LVOT diam:     1.90 cm   LV E/e' lateral: 13.2  LV SV:         81  LV SV Index:   40  LVOT Area:     2.84 cm     RIGHT VENTRICLE  RV S prime:     9.03 cm/s  TAPSE  (M-mode): 1.2 cm   LEFT ATRIUM             Index        RIGHT ATRIUM           Index  LA Vol (A2C):   30.9 ml 15.49 ml/m  RA Area:     15.35 cm  LA Vol (A4C):   67.5 ml 33.83 ml/m  RA Volume:   37.90 ml  18.99 ml/m  LA Biplane Vol: 47.2 ml 23.66 ml/m   AORTIC VALVE  AV Area (Vmax):    2.32 cm  AV Area (Vmean):   2.30 cm  AV Area (VTI):     2.39 cm  AV Vmax:           186.00 cm/s  AV Vmean:          139.000 cm/s  AV VTI:            0.337 m  AV Peak Grad:      13.8 mmHg  AV Mean Grad:      8.0 mmHg  LVOT Vmax:         152.00 cm/s  LVOT Vmean:        113.000 cm/s  LVOT VTI:  0.284 m  LVOT/AV VTI ratio: 0.84    AORTA  Ao Root diam: 3.20 cm   MITRAL VALVE  MV Area (PHT): 3.40 cm     SHUNTS  MV Decel Time: 223 msec     Systemic VTI:  0.28 m  MV E velocity: 125.00 cm/s  Systemic Diam: 1.90 cm  MV A velocity: 94.90 cm/s  MV E/A ratio:  1.32   Shelda Bruckner MD  Electronically signed by Shelda Bruckner MD  Signature Date/Time: 10/06/2023/4:38:23 PM        Treatments: surgery: Operative Report    DATE OF PROCEDURE: 09/22/2023   PREOPERATIVE DIAGNOSIS:  Critical aortic stenosis with valvular cardiomyopathy.   POSTOPERATIVE DIAGNOSIS:  Critical aortic stenosis with valvular cardiomyopathy.   PROCEDURE:   Median sternotomy, extracorporeal circulation,  Aortic valve replacement with 23 mm Edwards Inspiris Resilia pericardial tissue valve (model #[id number], serial number [id number]).   SURGEON:  Elspeth BROCKS. Kerrin, MD   Discharge Exam: Blood pressure 111/68, pulse 82, temperature 97.6 F (36.4 C), temperature source Oral, resp. rate 14, height 5' 5 (1.651 m), weight 89 kg, SpO2 100%.  General appearance: alert, cooperative, and no distress Heart: regular rate and rhythm, no a-fib past 24 hours Lungs: clear to auscultation bilaterally Abdomen: benign Extremities: no LE edema Wound: the blister over the right shin has is scabbed over with no  evidence of any retained fluid. The left shin wound is superficial measuring ~4x5cm. It is clean but has some serosanguinous oozing.    Discharge Medications:  The patient has been discharged on:   1.Beta Blocker:  Yes [ X  ]                              No   [   ]                              If No, reason:  2.Ace Inhibitor/ARB: Yes [ x ]                                     No  [    ]                                     If No, reason:  3.Statin:   Yes [ X  ]                  No  [   ]                  If No, reason:  4.Ecasa:  Yes  [  X ]                  No   [   ]                  If No, reason:  Patient had ACS upon admission: No  Plavix/P2Y12 inhibitor: Yes [   ]                                      No  [  X ]  Allergies as of 10/15/2023       Reactions   Invokana [canagliflozin] Other (See Comments)   Dizziness Syncope    Levaquin [levofloxacin] Rash   Zestril  [lisinopril ] Cough   Farxiga  [dapagliflozin ] Other (See Comments)   dizziness        Medication List     STOP taking these medications    aspirin  EC 325 MG tablet Replaced by: aspirin  81 MG chewable tablet       TAKE these medications    acetaminophen  325 MG tablet Commonly known as: Tylenol  Take 2 tablets (650 mg total) by mouth every 6 (six) hours as needed for mild pain (pain score 1-3).   amiodarone  200 MG tablet Commonly known as: PACERONE  Take 1 tablet (200 mg total) by mouth 2 (two) times daily.   apixaban  5 MG Tabs tablet Commonly known as: ELIQUIS  Take 1 tablet (5 mg total) by mouth 2 (two) times daily.   aspirin  81 MG chewable tablet Chew 1 tablet (81 mg total) by mouth daily. Replaces: aspirin  EC 325 MG tablet   atorvastatin  40 MG tablet Commonly known as: LIPITOR Take 1 tablet (40 mg total) by mouth at bedtime.   gabapentin  100 MG capsule Commonly known as: NEURONTIN  Take 1 capsule (100 mg total) by mouth 2 (two) times daily. If you feel you no longer require  this medication for pain please DO NOT stop abruptly. Please wean to 1 tablet daily for 1 week and then discontinue. What changed: how much to take   insulin  lispro 100 UNIT/ML KwikPen Commonly known as: HUMALOG  Inject 16 Units into the skin 3 (three) times daily.   metoprolol  succinate 25 MG 24 hr tablet Commonly known as: Toprol  XL Take 1 tablet (25 mg total) by mouth daily.   oxyCODONE  5 MG immediate release tablet Commonly known as: Oxy IR/ROXICODONE  Take 1 tablet (5 mg total) by mouth every 12 (twelve) hours as needed for up to 7 days for moderate pain (pain score 4-6). What changed:  when to take this reasons to take this   Ozempic (0.25 or 0.5 MG/DOSE) 2 MG/3ML Sopn Generic drug: Semaglutide(0.25 or 0.5MG /DOS) Inject 0.25 mg into the skin once a week.   sacubitril -valsartan  24-26 MG Commonly known as: ENTRESTO  Take 1 tablet by mouth 2 (two) times daily.   spironolactone  25 MG tablet Commonly known as: ALDACTONE  Take 1 tablet (25 mg total) by mouth daily.   torsemide  20 MG tablet Commonly known as: DEMADEX  Take 1 tablet (20 mg total) by mouth daily. What changed: when to take this   Tresiba FlexTouch 200 UNIT/ML FlexTouch Pen Generic drug: insulin  degludec Inject 54 Units into the skin daily.        Follow-up Information     Kerrin Elspeth BROCKS, MD Follow up on 11/01/2023.   Specialty: Cardiothoracic Surgery Why: Appointment is at 11:45AM, please get a chest xray at 10:45AM on the 3rd floor of our office Contact information: [address] [place] [address] [phone]         Madie Jon Garre, PA Follow up on 10/26/2023.   Specialties: Cardiology, Radiology Why: Cardiology appointment is at 2:20PM Contact information: [address] [place] [address] [phone]         Michele Richardson, DO Follow up on 10/27/2023.   Specialties: Cardiology, Vascular Surgery Why: Cardiology follow up is at 4:00PM Contact  information: [address] [place] [address] [phone]         [facility] CV Img Echo at [facility] [place] Follow up on 11/08/2023.   Specialty: Cardiology Why: Echocardiogram is at 7:20AM Contact information: [address] [place]  [address] [phone]        [facility] Follow up on 10/24/2023.   Specialty: Cardiology Why: Follow up in the Advanced Heart Failure Clinic 7/21 at 1030 Entrance C, free valet Contact information: [address] [place]  [phone]        [facility] Follow up on 10/20/2023.   Specialty: Cardiology Why: Labs in AHF clinic 10/20/23 at 10am Contact information: [address] [place]  [address] [phone]        Rosamond Leta NOVAK, MD Follow up.   Specialty: Internal Medicine Why: please schedule a hospital follow up appt to be seen in 7-14 days Contact information: [address] [place] [address] [phone]                 Signed:  Laurel JUDITHANN Becket, PA-C  10/15/2023, 7:54 AM

## 2023-10-15 NOTE — Progress Notes (Signed)
Patient given discharge instructions. PIV removed. Telemetry box removed, CCMD notified. Patient taken to vehicle in wheelchair by staff.  Chidera Dearcos L Anzal Bartnick, RN  

## 2023-10-15 NOTE — Progress Notes (Addendum)
 [address]       [place] [address]             [phone]         Subjective: Awake and alert, no new concerns. Walking independently, minimal soreness in his chest.  Eager to return home.   Objective: Vital signs in last 24 hours: Temp:  [97.7 F (36.5 C)-98.1 F (36.7 C)] 97.9 F (36.6 C) (07/12 0300) Pulse Rate:  [76-86] 76 (07/12 0300) Cardiac Rhythm: Normal sinus rhythm (07/11 2100) Resp:  [16-18] 17 (07/12 0300) BP: (102-130)/(69-82) 112/73 (07/12 0300) SpO2:  [96 %-100 %] 98 % (07/12 0300) Weight:  [89 kg] 89 kg (07/12 0500)  Intake/Output from previous day: 07/11 0701 - 07/12 0700 In: 550 [P.O.:400; IV Piggyback:150] Out: -  Intake/Output this shift: No intake/output data recorded.  General appearance: alert, cooperative, and no distress Heart: regular rate and rhythm, no a-fib past 24 hours Lungs: clear to auscultation bilaterally Abdomen: benign Extremities: no LE edema Wound: the blister over the right shin has is scabbed over with no evidence of any retained fluid. The left shin wound is superficial measuring ~4x5cm. It is clean but has some serosanguinous oozing.    Lab Results: Recent Labs    10/14/23 0316 10/15/23 0321  WBC 8.6 8.7  HGB 11.5* 11.7*  HCT 34.8* 35.3*  PLT 337 324   BMET:  Recent Labs    10/14/23 0316 10/15/23 0321  NA 138 135  K 4.3 4.2  CL 103 98  CO2 29 28  GLUCOSE 88 125*  BUN 38* 37*  CREATININE 1.15 1.19  CALCIUM  8.8* 8.6*    PT/INR: No results for input(s): LABPROT, INR in the last 72 hours. ABG    Component Value Date/Time   PHART 7.260 (L) 09/22/2023 1758   HCO3 20.2 09/22/2023 1758   TCO2 22 09/22/2023 1758   ACIDBASEDEF 7.0 (H) 09/22/2023 1758   O2SAT 91 09/22/2023 1758   CBG (last 3)  Recent Labs    10/14/23 1640 10/14/23 2100 10/15/23 0620  GLUCAP 152* 136* 103*    Meds Scheduled Meds:  amiodarone   200 mg Oral BID   apixaban   5 mg Oral BID   aspirin   81 mg  Oral Daily   atorvastatin   40 mg Oral QHS   docusate sodium   100 mg Oral BID   gabapentin   100 mg Oral BID   insulin  aspart  0-5 Units Subcutaneous QHS   insulin  aspart  0-9 Units Subcutaneous TID WC   insulin  aspart  8 Units Subcutaneous TID WC   insulin  glargine-yfgn  24 Units Subcutaneous BID   metoprolol  succinate  25 mg Oral Daily   potassium chloride   20 mEq Oral Daily   sacubitril -valsartan   1 tablet Oral BID   sodium chloride  flush  3 mL Intravenous Q12H   spironolactone   25 mg Oral Daily   torsemide   20 mg Oral Daily   Continuous Infusions:   ceFAZolin  (ANCEF ) IV 1 g (10/15/23 0613)   PRN Meds:.acetaminophen  **OR** acetaminophen , ondansetron  (ZOFRAN ) IV, oxyCODONE   Xrays No results found.  Assessment/Plan:  -Hospital day 9 readmission after bioprosthetic AVR with acute on chronic systolic HF with LE edema, draining LE wounds and early cellulitis. Wounds have improved and cellulitis resolved after completing 7 days IV Ancef  and correction of volume excess. Plan discharge today.  OK to leave the right leg wound open to air. Would continue dressing changes to the left leg wound.  Has close follow up planned with cardiology, advanced heart failure, and with our office (this Thursday).   -Atrial fibrillation- has maintained SR past 24 hours. Continue amiodarone  200mg  PO BID for 9 more days then 200mg  daily per cardiology recs.   -ENDO- type 2 DM- control reasonable. Appetite is better. Will resume his usual insulin  regimen at home.  -AKI- Resolved, creat stable at 1.19.    LOS: 10 days    Laurel JUDITHANN Becket PA-C Pager [phone]  10/15/2023 Patient seen and examined, agree with above Will dc home today  Elspeth BROCKS. Kerrin, MD [facility] [phone]

## 2023-10-15 NOTE — TOC Transition Note (Signed)
 Transition of Care [facility]) - Discharge Note   Patient Details  Name: Jonathon Bray MRN: [id number] Date of Birth: 1965-10-04  [facility] CM/SW Contact:  Robynn Eileen Hoose, RN Phone Number: 10/15/2023, 8:22 AM   Clinical Narrative:   Patient is being discharged home today. Outpatient referral placed to [facility] for wound care. Contact information placed on AVS.    Final next level of care: Home/Self Care Barriers to Discharge: No Barriers Identified   Patient Goals and CMS Choice Patient states their goals for this hospitalization and ongoing recovery are:: wants to get better          Discharge Placement                       Discharge Plan and Services Additional resources added to the After Visit Summary for     Discharge Planning Services: CM Consult                                 Social Drivers of Health (SDOH) Interventions SDOH Screenings   Food Insecurity: No Food Insecurity (10/05/2023)  Housing: Low Risk  (10/05/2023)  Transportation Needs: No Transportation Needs (10/05/2023)  Utilities: Not At Risk (10/05/2023)  Social Connections: Unknown (08/17/2021)   Received from [facility]  Tobacco Use: Low Risk  (10/11/2023)     Readmission Risk Interventions    09/28/2023    2:07 PM  Readmission Risk Prevention Plan  Transportation Screening Complete  PCP or Specialist Appt within 5-7 Days Complete  Home Care Screening Complete  Medication Review (RN CM) Complete

## 2023-10-18 ENCOUNTER — Ambulatory Visit (HOSPITAL_COMMUNITY)
Admission: RE | Admit: 2023-10-18 | Discharge: 2023-10-18 | Disposition: A | Discharge status: Home / Self Care | Source: Ambulatory Visit | Attending: *Deleted | Admitting: *Deleted

## 2023-10-18 ENCOUNTER — Ambulatory Visit (HOSPITAL_COMMUNITY)
Admission: RE | Admit: 2023-10-18 | Discharge: 2023-10-18 | Disposition: A | Discharge status: Home / Self Care | Source: Ambulatory Visit | Attending: Physician Assistant | Admitting: Physician Assistant

## 2023-10-18 DIAGNOSIS — Z794 Long term (current) use of insulin: Secondary | ICD-10-CM | POA: Diagnosis not present

## 2023-10-18 DIAGNOSIS — I129 Hypertensive chronic kidney disease with stage 1 through stage 4 chronic kidney disease, or unspecified chronic kidney disease: Secondary | ICD-10-CM | POA: Insufficient documentation

## 2023-10-18 DIAGNOSIS — N182 Chronic kidney disease, stage 2 (mild): Secondary | ICD-10-CM | POA: Insufficient documentation

## 2023-10-18 DIAGNOSIS — Z952 Presence of prosthetic heart valve: Secondary | ICD-10-CM | POA: Diagnosis not present

## 2023-10-18 DIAGNOSIS — E1122 Type 2 diabetes mellitus with diabetic chronic kidney disease: Secondary | ICD-10-CM | POA: Diagnosis not present

## 2023-10-18 DIAGNOSIS — E669 Obesity, unspecified: Secondary | ICD-10-CM | POA: Insufficient documentation

## 2023-10-18 DIAGNOSIS — R1310 Dysphagia, unspecified: Secondary | ICD-10-CM | POA: Diagnosis present

## 2023-10-18 DIAGNOSIS — I77810 Thoracic aortic ectasia: Secondary | ICD-10-CM | POA: Diagnosis not present

## 2023-10-18 DIAGNOSIS — T17900A Unspecified foreign body in respiratory tract, part unspecified causing asphyxiation, initial encounter: Secondary | ICD-10-CM | POA: Insufficient documentation

## 2023-10-18 NOTE — Progress Notes (Unsigned)
 [address] [place] [address]             [phone]       HPI: [name] is a 58 year old male with medical history of HTN, NSTEMI, heart failure, type 2 DM, stage II chronic kidney disease, and hyperlipidemia who presents to the office for follow up of leg wounds.  Patient had a bioprosthetic aortic valve replacement on 09/22/2023 by Dr. Kerrin with a 23 mm Edwards Inspiris Resilia pericardial tissue valve. He was discharged from the hospital on 09/28/2023 on torsemide  with Unna boots in place to mitigate the lower extremity edema.  He then was seen in the clinic on 10/05/2023 for follow up where he had lower leg edema with open blisters on the anterior surface of both shins and his feet. He was unable to tolerate the Unna boots due to discomfort.  He was then readmitted to the hospital for wound care since he was unable to see outpatient wound care for 2 weeks.  He was discharged on 10/15/2023 and the right leg wound was scabbed over and dry with no evidence of retained fluid. The left leg blister had been debrided with dressing changes and exposing a clean, moist skin layer that was healing with no sign of complication. Peripheral edema had resolved.    [name] presents today for wound check.  Wound on right shin is scabbed over without drainage or erythema.  Wound on left shin was without drainage.  He has been keeping the areas clean and dry.  He continues to do daily wound dressing changes. He denies fever and lower leg swelling.     Current Outpatient Medications  Medication Sig Dispense Refill   acetaminophen  (TYLENOL ) 325 MG tablet Take 2 tablets (650 mg total) by mouth every 6 (six) hours as needed for mild pain (pain score 1-3).     amiodarone  (PACERONE ) 200 MG tablet Take 1 tablet (200 mg total) by mouth 2 (two) times daily. 60 tablet 1   apixaban  (ELIQUIS ) 5 MG TABS tablet Take 1 tablet (5 mg total) by mouth 2 (two) times daily. 60 tablet  1   aspirin  81 MG chewable tablet Chew 1 tablet (81 mg total) by mouth daily.     atorvastatin  (LIPITOR) 40 MG tablet Take 1 tablet (40 mg total) by mouth at bedtime. 90 tablet 3   gabapentin  (NEURONTIN ) 100 MG capsule Take 1 capsule (100 mg total) by mouth 2 (two) times daily. If you feel you no longer require this medication for pain please DO NOT stop abruptly. Please wean to 1 tablet daily for 1 week and then discontinue.     insulin  lispro (HUMALOG ) 100 UNIT/ML KwikPen Inject 16 Units into the skin 3 (three) times daily.     metoprolol  succinate (TOPROL  XL) 25 MG 24 hr tablet Take 1 tablet (25 mg total) by mouth daily. 60 tablet 1   oxyCODONE  (OXY IR/ROXICODONE ) 5 MG immediate release tablet Take 1 tablet (5 mg total) by mouth every 12 (twelve) hours as needed for up to 7 days for moderate pain (pain score 4-6). 14 tablet 0   OZEMPIC, 0.25 OR 0.5 MG/DOSE, 2 MG/3ML SOPN Inject 0.25 mg into the skin once a week.     sacubitril -valsartan  (ENTRESTO ) 24-26 MG Take 1 tablet by mouth 2 (two) times daily. 60 tablet 1   spironolactone  (ALDACTONE ) 25 MG tablet Take 1 tablet (25 mg total) by  mouth daily. 30 tablet 1   torsemide  (DEMADEX ) 20 MG tablet Take 1 tablet (20 mg total) by mouth daily. 30 tablet 1   TRESIBA FLEXTOUCH 200 UNIT/ML FlexTouch Pen Inject 54 Units into the skin daily.     No current facility-administered medications for this visit.   Vitals:   10/20/23 1114  BP: 114/76  Pulse: 79  Resp: 20  SpO2: 96%   Review of Systems  Constitutional: Negative.  Negative for chills and fever.  Respiratory: Negative.  Negative for cough.   Cardiovascular: Negative.  Negative for chest pain, palpitations and leg swelling.  Skin:        Wounds to bilateral shins     Physical Exam: Physical Exam Constitutional:      Appearance: Normal appearance.  HENT:     Head: Normocephalic and atraumatic.  Cardiovascular:     Rate and Rhythm: Normal rate and regular rhythm.     Heart sounds: S1  normal and S2 normal. Murmur heard.     Comments: Soft systolic murmur consistent with the bioprosthetic aortic valve Pulmonary:     Effort: Pulmonary effort is normal.     Breath sounds: Normal breath sounds.  Musculoskeletal:     Right ankle: No swelling.     Left ankle: No swelling.     Right foot: No swelling.     Left foot: No swelling.  Skin:    General: Skin is warm and dry.     Findings: Wound present.         Comments: Well healing wounds to bilateral shins. No erythema or drainage.  Right and left shin with scabbing  Neurological:     General: No focal deficit present.     Mental Status: He is alert and oriented to person, place, and time.        Plan: Continue to keep bilateral shin wounds clean and dry.  Continue with daily dressing changes.  Try to keep legs elevated when sitting to help with lower leg edema.  He understands that if wounds start to have increased drainage, erythema, swelling, tenderness to touch, he would need to notify the office for wound care referral.    He has apt with [facility] on 11/01/2023. He also has apts with the heart failure clinic and cardiology in the next week.    [name], PA-C [facility] [phone]

## 2023-10-18 NOTE — Therapy (Signed)
 Modified Barium Swallow Study  Patient Details  Name: Jonathon Bray MRN: [id number] Date of Birth: 10-May-1965  Today's Date: 10/18/2023  Modified Barium Swallow completed.  Full report located under Chart Review in the Imaging Section.  History of Present Illness Jonathon Bray is a 58 y.o. male with PMH: hypertension, type 2 insulin -dependent diabetes, stage II chronic kidney disease, obesity ascending aortic ectasia, and aortic stenosis s/p open heart surgery for replacement of aortic valve on 09/22/23. During that hospitalization he had a FEES on 6/20 which showed silent aspiration of thin liquids as well as an MBS on 6/23 which showed continued dysphagia with aspiration of thin liquids and penetration of nectar thick liquids. During discussion with patient prior to today's repeat MBS, he indicated that following previous MBS, he has been drinking thickened drinks to nectar thick as recommended for about 4 days but has been drinking regular drinks since. He has no complaints about his swallowing and reported the test was ordered to see if it's gotten better.   Clinical Impression Jonathon Bray continues to present with swallow initiation delays with thin and nectar thick liquids as in previous MBS last month, however during today's MBS, even when challenged with consecutive cup sips and straw sips of thin liquid, no instances of vestibular penetration or aspiration were observed and no pharyngeal residuals observed s/p initial swallow. SLP is recommending he continue on current PO diet which he reports is regular texture solids and thin liquids. No further skilled SLP intervention warranted at this time.   Swallow Evaluation Recommendations Recommendations: PO diet PO Diet Recommendation: Regular;Thin liquids (Level 0) Liquid Administration via: Cup;Straw Medication Administration: Whole meds with liquid Supervision: Patient able to self-feed;Full supervision/cueing for swallowing  strategies Swallowing strategies  : Small bites/sips Postural changes: Position pt fully upright for meals  Norleen IVAR Blase, MA, CCC-SLP Speech Therapy

## 2023-10-20 ENCOUNTER — Ambulatory Visit

## 2023-10-20 ENCOUNTER — Ambulatory Visit
Admit: 2023-10-20 | Discharge: 2023-10-20 | Disposition: A | Discharge status: Home / Self Care | Source: Ambulatory Visit | Attending: Cardiology | Admitting: Cardiology

## 2023-10-20 VITALS — BP 114/76 | HR 79 | Resp 20 | Ht 65.0 in | Wt 198.0 lb

## 2023-10-20 DIAGNOSIS — Z794 Long term (current) use of insulin: Secondary | ICD-10-CM | POA: Diagnosis not present

## 2023-10-20 DIAGNOSIS — I509 Heart failure, unspecified: Secondary | ICD-10-CM | POA: Diagnosis not present

## 2023-10-20 DIAGNOSIS — E785 Hyperlipidemia, unspecified: Secondary | ICD-10-CM | POA: Insufficient documentation

## 2023-10-20 DIAGNOSIS — Z953 Presence of xenogenic heart valve: Secondary | ICD-10-CM | POA: Diagnosis not present

## 2023-10-20 DIAGNOSIS — I130 Hypertensive heart and chronic kidney disease with heart failure and stage 1 through stage 4 chronic kidney disease, or unspecified chronic kidney disease: Secondary | ICD-10-CM | POA: Diagnosis not present

## 2023-10-20 DIAGNOSIS — Z7985 Long-term (current) use of injectable non-insulin antidiabetic drugs: Secondary | ICD-10-CM | POA: Insufficient documentation

## 2023-10-20 DIAGNOSIS — I252 Old myocardial infarction: Secondary | ICD-10-CM | POA: Insufficient documentation

## 2023-10-20 DIAGNOSIS — N182 Chronic kidney disease, stage 2 (mild): Secondary | ICD-10-CM | POA: Diagnosis not present

## 2023-10-20 DIAGNOSIS — Z4800 Encounter for change or removal of nonsurgical wound dressing: Secondary | ICD-10-CM | POA: Insufficient documentation

## 2023-10-20 DIAGNOSIS — E1122 Type 2 diabetes mellitus with diabetic chronic kidney disease: Secondary | ICD-10-CM | POA: Diagnosis not present

## 2023-10-20 LAB — BASIC METABOLIC PANEL WITH GFR
Anion gap: 12 (ref 5–15)
BUN: 48 mg/dL — ABNORMAL HIGH (ref 6–20)
CO2: 31 mmol/L (ref 22–32)
Calcium: 9.6 mg/dL (ref 8.9–10.3)
Chloride: 96 mmol/L — ABNORMAL LOW (ref 98–111)
Creatinine, Ser: 1.53 mg/dL — ABNORMAL HIGH (ref 0.61–1.24)
GFR, Estimated: 53 mL/min — ABNORMAL LOW (ref >60)
Glucose, Bld: 178 mg/dL — ABNORMAL HIGH (ref 70–99)
Potassium: 5.3 mmol/L — ABNORMAL HIGH (ref 3.5–5.1)
Sodium: 139 mmol/L (ref 135–145)

## 2023-10-20 NOTE — Patient Instructions (Signed)
-  Continue to keep bilateral shin wounds clean and dry -Continue with daily dressing changes

## 2023-10-21 ENCOUNTER — Telehealth (HOSPITAL_COMMUNITY): Payer: Self-pay | Admitting: Adult Health

## 2023-10-21 ENCOUNTER — Telehealth (HOSPITAL_COMMUNITY): Payer: Self-pay

## 2023-10-21 MED ORDER — TORSEMIDE 20 MG PO TABS: 20.0000 mg | ORAL_TABLET | ORAL | Status: DC

## 2023-10-21 NOTE — Telephone Encounter (Signed)
 I reviewed BMET from 10/20/23. Sent to our office from [facility] surguery.   K and Creatinine elevated.   Please call. Hold torsemide  x 2 days then start torsemide  20 mg every other day. Stop spironolactone . Needs BMET next week.   Maahir Horst NP-C  9:15 AM

## 2023-10-21 NOTE — Addendum Note (Signed)
 Addended by: TITA AGE B on: 10/21/2023 09:55 AM   Modules accepted: Orders

## 2023-10-21 NOTE — Telephone Encounter (Signed)
 Spoke with patient regarding the following results. Patient made aware and patient verbalized understanding.   Patient aware of medication changes that are needed and verbalized understanding.   Patient scheduled for clinic visit next week- will re check labs at that time.   Medication list updated.   Advised patient to call back to office with any issues, questions, or concerns. Patient verbalized understanding.

## 2023-10-21 NOTE — Telephone Encounter (Signed)
 Called to confirm/remind patient of their appointment at the [facility] on 10/24/23.   Appointment:   [x] Confirmed  [] Left mess   [] No answer/No voice mail  [] VM Full/unable to leave message  [] Phone not in service  Patient reminded to bring all medications and/or complete list.  Confirmed patient has transportation. Gave directions, instructed to utilize valet parking.

## 2023-10-24 ENCOUNTER — Encounter (HOSPITAL_COMMUNITY): Payer: Self-pay

## 2023-10-24 ENCOUNTER — Ambulatory Visit (HOSPITAL_COMMUNITY): Payer: Self-pay | Admitting: Family Medicine

## 2023-10-24 ENCOUNTER — Telehealth (HOSPITAL_COMMUNITY): Payer: Self-pay | Admitting: Pharmacy Technician

## 2023-10-24 ENCOUNTER — Other Ambulatory Visit (HOSPITAL_COMMUNITY): Payer: Self-pay

## 2023-10-24 ENCOUNTER — Ambulatory Visit (HOSPITAL_COMMUNITY)
Admission: RE | Admit: 2023-10-24 | Discharge: 2023-10-24 | Disposition: A | Discharge status: Home / Self Care | Source: Ambulatory Visit | Attending: Family Medicine | Admitting: Family Medicine

## 2023-10-24 VITALS — BP 118/74 | HR 72 | Ht 65.0 in | Wt 207.0 lb

## 2023-10-24 DIAGNOSIS — Z888 Allergy status to other drugs, medicaments and biological substances status: Secondary | ICD-10-CM | POA: Diagnosis not present

## 2023-10-24 DIAGNOSIS — Z79899 Other long term (current) drug therapy: Secondary | ICD-10-CM | POA: Diagnosis not present

## 2023-10-24 DIAGNOSIS — I428 Other cardiomyopathies: Secondary | ICD-10-CM | POA: Insufficient documentation

## 2023-10-24 DIAGNOSIS — Z7985 Long-term (current) use of injectable non-insulin antidiabetic drugs: Secondary | ICD-10-CM | POA: Diagnosis not present

## 2023-10-24 DIAGNOSIS — I5022 Chronic systolic (congestive) heart failure: Secondary | ICD-10-CM | POA: Diagnosis not present

## 2023-10-24 DIAGNOSIS — Z794 Long term (current) use of insulin: Secondary | ICD-10-CM | POA: Insufficient documentation

## 2023-10-24 DIAGNOSIS — N183 Chronic kidney disease, stage 3 unspecified: Secondary | ICD-10-CM | POA: Diagnosis not present

## 2023-10-24 DIAGNOSIS — I350 Nonrheumatic aortic (valve) stenosis: Secondary | ICD-10-CM | POA: Diagnosis not present

## 2023-10-24 DIAGNOSIS — E1122 Type 2 diabetes mellitus with diabetic chronic kidney disease: Secondary | ICD-10-CM | POA: Insufficient documentation

## 2023-10-24 DIAGNOSIS — I83009 Varicose veins of unspecified lower extremity with ulcer of unspecified site: Secondary | ICD-10-CM | POA: Insufficient documentation

## 2023-10-24 DIAGNOSIS — I130 Hypertensive heart and chronic kidney disease with heart failure and stage 1 through stage 4 chronic kidney disease, or unspecified chronic kidney disease: Secondary | ICD-10-CM | POA: Diagnosis present

## 2023-10-24 DIAGNOSIS — Z7901 Long term (current) use of anticoagulants: Secondary | ICD-10-CM | POA: Insufficient documentation

## 2023-10-24 DIAGNOSIS — Z833 Family history of diabetes mellitus: Secondary | ICD-10-CM | POA: Diagnosis not present

## 2023-10-24 DIAGNOSIS — Z953 Presence of xenogenic heart valve: Secondary | ICD-10-CM | POA: Diagnosis not present

## 2023-10-24 DIAGNOSIS — I878 Other specified disorders of veins: Secondary | ICD-10-CM | POA: Diagnosis not present

## 2023-10-24 DIAGNOSIS — R2689 Other abnormalities of gait and mobility: Secondary | ICD-10-CM | POA: Insufficient documentation

## 2023-10-24 DIAGNOSIS — Z8249 Family history of ischemic heart disease and other diseases of the circulatory system: Secondary | ICD-10-CM | POA: Insufficient documentation

## 2023-10-24 DIAGNOSIS — I4891 Unspecified atrial fibrillation: Secondary | ICD-10-CM | POA: Insufficient documentation

## 2023-10-24 LAB — BASIC METABOLIC PANEL WITH GFR
Anion gap: 7 (ref 5–15)
BUN: 32 mg/dL — ABNORMAL HIGH (ref 6–20)
CO2: 29 mmol/L (ref 22–32)
Calcium: 9.4 mg/dL (ref 8.9–10.3)
Chloride: 98 mmol/L (ref 98–111)
Creatinine, Ser: 1.25 mg/dL — ABNORMAL HIGH (ref 0.61–1.24)
GFR, Estimated: >60 mL/min (ref >60)
Glucose, Bld: 210 mg/dL — ABNORMAL HIGH (ref 70–99)
Potassium: 5.9 mmol/L — ABNORMAL HIGH (ref 3.5–5.1)
Sodium: 134 mmol/L — ABNORMAL LOW (ref 135–145)

## 2023-10-24 LAB — CBC
HCT: 42.6 % (ref 39.0–52.0)
Hemoglobin: 14.5 g/dL (ref 13.0–17.0)
MCH: 28.9 pg (ref 26.0–34.0)
MCHC: 34 g/dL (ref 30.0–36.0)
MCV: 85 fL (ref 80.0–100.0)
Platelets: 369 K/uL (ref 150–400)
RBC: 5.01 MIL/uL (ref 4.22–5.81)
RDW: 13.2 % (ref 11.5–15.5)
WBC: 10.6 K/uL — ABNORMAL HIGH (ref 4.0–10.5)
nRBC: 0 % (ref 0.0–0.2)

## 2023-10-24 LAB — BRAIN NATRIURETIC PEPTIDE: B Natriuretic Peptide: 400.3 pg/mL — ABNORMAL HIGH (ref 0.0–100.0)

## 2023-10-24 MED ORDER — AMIODARONE HCL 200 MG PO TABS: 200.0000 mg | ORAL_TABLET | Freq: Every day | ORAL | 3 refills | Status: DC

## 2023-10-24 MED ORDER — LOKELMA 10 G PO PACK: 10.0000 g | PACK | Freq: Once | ORAL | 0 refills | Status: AC

## 2023-10-24 MED ORDER — SPIRONOLACTONE 25 MG PO TABS: 12.5000 mg | ORAL_TABLET | Freq: Every day | ORAL | 3 refills | Status: DC

## 2023-10-24 NOTE — Patient Instructions (Signed)
 Decrease Amiodarone  to 200 mg daily - updated Rx sent. Re-start Spiro 12.5 mg daily - Rx sent. Labs today - will call you if abnormal. Repeat labs in one week at [facility] lab - orders placed.  Return to Heart Failure APP Clinic in 3 - 4 weeks. See below. Return to see Dr. Rolan in 3 months.  CALL [phone] IN SEPTEMBER TO SCHEDULE THIS APPOINTMENT.  Please call us  at [phone] if any questions or concerns prior to your next appointment.    It is ok to cancel with [facility] GLENWOOD Harlene will text her.

## 2023-10-24 NOTE — Progress Notes (Deleted)
 Cardiology Office Note:    Date:  10/24/2023   ID:  Jonathon Bray, DOB April 21, 1965, MRN [id number]  PCP:  Rosamond Leta NOVAK, MD   [facility] Providers Cardiologist:  Madonna Large, DO { Click to update primary MD,subspecialty MD or APP then REFRESH:1}    Referring MD: Rosamond Leta NOVAK, MD   No chief complaint on file. ***  History of Present Illness:    Jonathon Bray is a 58 y.o. male with a hx of chronic systolic heart failure, HTN, DM2, CKD 2, and aortic stenosis s/p AVR 09/22/23. Post op echo with LVEF 50% and normal RV function. Echo prior to AVR with  LVEF 20-25% with septal-lateral dyssynchrony. LHC prior to AVR showed no significant CAD but markedly elevated filling pressures.   Since surgery, he has struggled with LE edema despite torsemide . He required readmission for LE edema, unna boots removed by patient due to discomfort. He had watery drainage form blisters on both shins. Repeat echo ordered and showed LVEF 40-45% with good AVR function. He was also treated for early cellulitis. He was seen by [facility] 10/24/23 who adjusted GDMT.  Appears to be on 200 mg amiodarone  for Afib PPX.  He returns for general cardiology follow up.     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  25 mg Toprol , 20/26 Entresto  twice daily -At [facility] visit BP did not allow titration - He has an allergy to Farxiga  and Invokana causing syncope and dizziness - Will need to consider CRT-D with LBBB if EF remains low, due to check during routine echocardiogram post valve surgery   Severe aortic stenosis S/p AVR - echo pending - if EF still low, ICD consideration as above - continue amiodarone  2 more months and then discontinued   CKD stage 3 - sCr 10/20/23 prior to discharge had bumped to 1.53 with K 5.3 - need repeat BMP       Past Medical History:  Diagnosis Date   Aortic stenosis    Diabetes mellitus  without complication (HCC)    type two - LIBRE SYSTEM ON RIGHT UPPER OUTER ARM   Hypertension    PONV (postoperative nausea and vomiting)     Past Surgical History:  Procedure Laterality Date   AORTIC VALVE REPLACEMENT N/A 09/22/2023   Procedure: REPLACEMENT, AORTIC VALVE, OPEN UTILIZING INSPIRIS RESILIA AORTIC VALVE;  Surgeon: Kerrin Elspeth BROCKS, MD;  Location: [facility] OR;  Service: Open Heart Surgery;  Laterality: N/A;   COLONOSCOPY  2020   CORONARY ANGIOGRAPHY N/A 05/10/2022   Procedure: CORONARY ANGIOGRAPHY;  Surgeon: Ladona Heinz, MD;  Location: [facility] INVASIVE CV LAB;  Service: Cardiovascular;  Laterality: N/A;   INTRAOPERATIVE TRANSESOPHAGEAL ECHOCARDIOGRAM N/A 09/22/2023   Procedure: ECHOCARDIOGRAM, TRANSESOPHAGEAL, INTRAOPERATIVE;  Surgeon: Kerrin Elspeth BROCKS, MD;  Location: [facility] OR;  Service: Open Heart Surgery;  Laterality: N/A;   KYPHOPLASTY N/A 06/19/2020   Procedure: ATTEMPTED LUMBAR TWO KYPHOPLASTY;  Surgeon: Gillie Duncans, MD;  Location: [facility] OR;  Service: Neurosurgery;  Laterality: N/A;   RIGHT HEART CATH AND CORONARY ANGIOGRAPHY N/A 06/16/2023   Procedure: RIGHT HEART CATH AND CORONARY ANGIOGRAPHY;  Surgeon: Wendel Lurena POUR, MD;  Location: [facility] INVASIVE CV LAB;  Service: Cardiovascular;  Laterality: N/A;    Current Medications: No outpatient medications have been marked as taking for the 10/26/23 encounter (Appointment) with Madie Jon Garre, PA.     Allergies:   Invokana [canagliflozin], Levaquin [levofloxacin],  Zestril  [lisinopril ], and Farxiga  [dapagliflozin ]   Social History   Socioeconomic History   Marital status: Married    Spouse name: Not on file   Number of children: 3   Years of education: Not on file   Highest education level: Not on file  Occupational History   Not on file  Tobacco Use   Smoking status: Never   Smokeless tobacco: Never  Vaping Use   Vaping status: Never Used  Substance and Sexual Activity   Alcohol use: No   Drug use: No   Sexual  activity: Yes  Other Topics Concern   Not on file  Social History Narrative   Not on file   Social Drivers of Health   Financial Resource Strain: Not on file  Food Insecurity: No Food Insecurity (10/05/2023)   Hunger Vital Sign    Worried About Running Out of Food in the Last Year: Never true    Ran Out of Food in the Last Year: Never true  Transportation Needs: No Transportation Needs (10/05/2023)   PRAPARE - Administrator, Civil Service (Medical): No    Lack of Transportation (Non-Medical): No  Physical Activity: Not on file  Stress: Not on file  Social Connections: Unknown (08/17/2021)   Received from [facility]   Social Network    Social Network: Not on file     Family History: The patient's ***family history includes Diabetes in his brother, father, and mother; Heart attack in his brother and sister; Heart disease in his father; Hypertension in his sister; Other (age of onset: 86) in his brother; Other (age of onset: 5) in his brother.  ROS:   Please see the history of present illness.    *** All other systems reviewed and are negative.  EKGs/Labs/Other Studies Reviewed:    The following studies were reviewed today: ***      Recent Labs: 07/27/2023: NT-Pro BNP 1,063 10/05/2023: ALT 12; B Natriuretic Peptide 459.1 10/13/2023: Magnesium  2.0 10/15/2023: Hemoglobin 11.7; Platelets 324 10/20/2023: BUN 48; Creatinine, Ser 1.53; Potassium 5.3; Sodium 139  Recent Lipid Panel    Component Value Date/Time   CHOL 116 05/10/2022 1356   CHOL 193 02/22/2022 1004   TRIG 63 05/10/2022 1356   HDL 45 05/10/2022 1356   HDL 47 02/22/2022 1004   CHOLHDL 2.6 05/10/2022 1356   VLDL 13 05/10/2022 1356   LDLCALC 58 05/10/2022 1356   LDLCALC 114 (H) 02/22/2022 1004   LDLDIRECT 119 (H) 02/22/2022 1004     Risk Assessment/Calculations:   {Does this patient have ATRIAL FIBRILLATION?:[id number]}  No BP recorded.  {Refresh Note OR Click here to enter BP  :1}***          Physical Exam:    VS:  There were no vitals taken for this visit.    Wt Readings from Last 3 Encounters:  10/20/23 198 lb (89.8 kg)  10/15/23 196 lb 4.8 oz (89 kg)  10/05/23 202 lb (91.6 kg)     GEN: *** Well nourished, well developed in no acute distress HEENT: Normal NECK: No JVD; No carotid bruits LYMPHATICS: No lymphadenopathy CARDIAC: ***RRR, no murmurs, rubs, gallops RESPIRATORY:  Clear to auscultation without rales, wheezing or rhonchi  ABDOMEN: Soft, non-tender, non-distended MUSCULOSKELETAL:  No edema; No deformity  SKIN: Warm and dry NEUROLOGIC:  Alert and oriented x 3 PSYCHIATRIC:  Normal affect   ASSESSMENT:    No diagnosis found. PLAN:    In order of problems listed above:  ***      {  Are you ordering a CV Procedure (e.g. stress test, cath, DCCV, TEE, etc)?   Press F2        :[id number]}    Medication Adjustments/Labs and Tests Ordered: Current medicines are reviewed at length with the patient today.  Concerns regarding medicines are outlined above.  No orders of the defined types were placed in this encounter.  No orders of the defined types were placed in this encounter.   There are no Patient Instructions on file for this visit.   Signed, Jon Nat Hails, [place]  10/24/2023 9:41 AM    [facility]

## 2023-10-24 NOTE — Progress Notes (Signed)
 [facility] CONSULT NOTE   Primary Care: Rosamond Leta NOVAK, MD Primary Cardiologist: Madonna Large, DO HF Cardiologist: Dr. Rolan  HPI: 58 y.o. male with history of HFrEF, HTN, T2DM, CKD 2, and aortic stenosis s/p bioprothestic AVR with Dr. Kerrin on 09/22/23. Echo post-op with EF 50% and nl RV, previously 20-25%.   Since procedure he has been struggling with significant lower extremity edema. He was discharge home with unna boots and torsemide . Attempts were made to have home health nursing and PT see him, however they were unable to visit since he lives in [place] .    He was seen 10/05/23 for post-op follow up in [facility] with continued significant lower extremity volume + weeping and blisters. Admitted for IV diuresis, AHF consulted. Echo showed EF 45-50%, significant LV dyssynchrony, LV with GHK, RV mildly reduced, LA mildly dilated, trivial MR, normal structure and function of AV prosthesis. He was diuresed with IV lasix , and GDMT titrated. Received IV cefazolin  for venous stasis ulcers. Hospitalization complicated by AF with RVR, loaded with PO amiodarone . He was discharged home, weight 197 lbs.  Today he returns for post hospital HF follow up. Overall feeling fine. Main issues is poor balance x 3 days. Walking with a cane, no falls. No SOB when walking on flat ground or walking up steps. Leg swelling improved. Denies palpitations, abnormal bleeding, CP, dizziness, or PND/Orthopnea. Appetite ok. Weight at home 198 pounds. Taking all medications. Planning PT/CR in [place].   ReDs reading: 34 %, normal  ECG (personally reviewed): NSR 72 bpm  Cardiac Studies  - Echo 10/06/23: EF 45-50%, significant LV dyssynchrony, LV with GHK, RV mildly reduced, LA mildly dilated, trivial MR, normal structure and function of AV prosthesis.  - Intra-Op TEE 09/22/23: EF 50%, RV normal  - Echo 09/11/23: EF 20-25%, RV normal, mild AI, severe AS LFLG, AoV mean gradient 21.0 mmHg  - R/LHC  3/35: normal cors; RA 19, PA 70/38 (52), PCWP 33 with V waves to 45 mmHg, PVR 4.5 WU, PAPi 1.7, CO/CI (Fick) 4.2/2.1  - Echo 2/25: EF 35-40%, RV normal, severe AS with mean gradient 47 mmhg  Past Medical History:  Diagnosis Date   Aortic stenosis    Diabetes mellitus without complication (HCC)    type two - LIBRE SYSTEM ON RIGHT UPPER OUTER ARM   Hypertension    PONV (postoperative nausea and vomiting)    Current Outpatient Medications  Medication Sig Dispense Refill   acetaminophen  (TYLENOL ) 325 MG tablet Take 2 tablets (650 mg total) by mouth every 6 (six) hours as needed for mild pain (pain score 1-3).     amiodarone  (PACERONE ) 200 MG tablet Take 1 tablet (200 mg total) by mouth 2 (two) times daily. 60 tablet 1   apixaban  (ELIQUIS ) 5 MG TABS tablet Take 1 tablet (5 mg total) by mouth 2 (two) times daily. 60 tablet 1   aspirin  81 MG chewable tablet Chew 1 tablet (81 mg total) by mouth daily.     atorvastatin  (LIPITOR) 40 MG tablet Take 1 tablet (40 mg total) by mouth at bedtime. 90 tablet 3   gabapentin  (NEURONTIN ) 100 MG capsule Take 1 capsule (100 mg total) by mouth 2 (two) times daily. If you feel you no longer require this medication for pain please DO NOT stop abruptly. Please wean to 1 tablet daily for 1 week and then discontinue.     insulin  lispro (HUMALOG ) 100 UNIT/ML KwikPen Inject 16 Units into the skin 3 (three) times  daily.     metoprolol  succinate (TOPROL  XL) 25 MG 24 hr tablet Take 1 tablet (25 mg total) by mouth daily. 60 tablet 1   OZEMPIC, 0.25 OR 0.5 MG/DOSE, 2 MG/3ML SOPN Inject 0.25 mg into the skin once a week.     sacubitril -valsartan  (ENTRESTO ) 24-26 MG Take 1 tablet by mouth 2 (two) times daily. 60 tablet 1   torsemide  (DEMADEX ) 20 MG tablet Take 1 tablet (20 mg total) by mouth every other day.     TRESIBA FLEXTOUCH 200 UNIT/ML FlexTouch Pen Inject 54 Units into the skin daily.     No current facility-administered medications for this encounter.   Allergies   Allergen Reactions   Invokana [Canagliflozin] Other (See Comments)    Dizziness Syncope    Levaquin [Levofloxacin] Rash   Zestril  [Lisinopril ] Cough   Farxiga  [Dapagliflozin ] Other (See Comments)    dizziness     Social History   Socioeconomic History   Marital status: Married    Spouse name: Not on file   Number of children: 3   Years of education: Not on file   Highest education level: Not on file  Occupational History   Not on file  Tobacco Use   Smoking status: Never   Smokeless tobacco: Never  Vaping Use   Vaping status: Never Used  Substance and Sexual Activity   Alcohol use: No   Drug use: No   Sexual activity: Yes  Other Topics Concern   Not on file  Social History Narrative   Not on file   Social Drivers of Health   Financial Resource Strain: Not on file  Food Insecurity: No Food Insecurity (10/05/2023)   Hunger Vital Sign    Worried About Running Out of Food in the Last Year: Never true    Ran Out of Food in the Last Year: Never true  Transportation Needs: No Transportation Needs (10/05/2023)   PRAPARE - Administrator, Civil Service (Medical): No    Lack of Transportation (Non-Medical): No  Physical Activity: Not on file  Stress: Not on file  Social Connections: Unknown (08/17/2021)   Received from [facility]   Social Network    Social Network: Not on file  Intimate Partner Violence: Not At Risk (10/05/2023)   Humiliation, Afraid, Rape, and Kick questionnaire    Fear of Current or Ex-Partner: No    Emotionally Abused: No    Physically Abused: No    Sexually Abused: No   Family History  Problem Relation Age of Onset   Diabetes Mother    Diabetes Father    Heart disease Father    Heart attack Sister    Hypertension Sister    Heart attack Brother    Other Brother 51       Shot in the head   Other Brother 5       Polio   Diabetes Brother    Wt Readings from Last 3 Encounters:  10/24/23 93.9 kg (207 lb)  10/20/23 89.8 kg (198  lb)  10/15/23 89 kg (196 lb 4.8 oz)   BP 118/74   Pulse 72   Ht 5' 5 (1.651 m)   Wt 93.9 kg (207 lb)   SpO2 97%   BMI 34.45 kg/m   PHYSICAL EXAM: General:  NAD. No resp difficulty, arrived in [place], chronically-ill appearing. HEENT: Normal Neck: Supple. No JVD. Cor: Regular rate & rhythm. No rubs, gallops or murmurs. Lungs: Clear Abdomen: Soft, obese, nontender, nondistended.  Extremities: No  cyanosis, clubbing, rash, trace BLE edema w/ healing venous stasis ulcers Neuro: Alert & oriented x 3, moves all 4 extremities w/o difficulty. Affect pleasant.  ASSESSMENT & PLAN: 1. Chronic systolic CHF: Nonischemic cardiomyopathy.  ?Valvular related to severe AS versus LBBB cardiomyopathy.  Cath in pre-AVR showed no significant CAD but markedly elevated filling pressures.  Echo in 6/25 (pre-op) showed EF 20-25% with septal-lateral dyssynchrony, normal RV function, severe aortic stenosis.  Patient is now s/p bioprosthetic AVR.  Intra-op TEE reported EF 50%. 2D echo post-op, 10/06/23 echo showed EF 45-50%, significant LV dyssynchrony, LV with GHK, RV mildly reduced, LA mildly dilated, trivial MR, normal structure and function of AV prosthesis. NYHA II, limited by unsteadiness/balance issues. He is not volume overloaded by exam, ReDs 34%. - Restart spironolactone  12.5 mg daily. BMET/BNP today, repeat BMET in 1 week - Continue torsemide  20 mg every other day - Continue Toprol  XL 25 mg daily - Continue Entresto  24-26 mg bid - Allergy to Farxiga  and Invokana (syncope/dizziness).  - Post-Op echo has been arranged, 11/08/23 2. Aortic stenosis: S/p bioprosthetic AVR in 6/25.  - Normal structure and function of AV prosthesis. AV mean gradient 8 mm HG 3. CKD stage 3: Creatinine baseline ~ 1.5-1.7.  - Adding spiro today. Follow renal function closely. 4. Venous stasis ulcers: Received IV abx during recent admission. Legs healing, no open areas. Swelling improved. 5. Atrial fibrillation: Had AF with RVR this  admission. NSR on ECG today. - Decrease amiodarone  to 200 mg daily. Will need regular eye exam while on amiodarone . - Continue Eliquis  5 mg bid. No bleeding issues. CBC today.   Follow up in 3-4 weeks with APP and 3 months with Dr. Rolan.    He has follow up with [facility], PA on 7/23 and Dr Michele on 7/24. I think he can cancel 7/23 appt. Will send message to [place].  Harlene Gainer, FNP-BC 10/24/23

## 2023-10-24 NOTE — Progress Notes (Signed)
 ReDS Vest / Clip - 10/24/23 1002       ReDS Vest / Clip   Station Marker B    Ruler Value 35    ReDS Value Range Low volume    ReDS Actual Value 34

## 2023-10-24 NOTE — Telephone Encounter (Signed)
 Pharmacy Patient Advocate Encounter  Insurance verification completed.   The patient is insured through [id number].   Ran test claim for Lokelma . Currently a quantity of 30 packs is a 30 day supply and the co-pay is $150 (with co-pay card, $0). Veltassa - 30 packs would be a 30 day supply and the co-pay would be $150 ($0 with co-pay card).  This test claim was processed through [facility]- copay amounts may vary at other pharmacies due to pharmacy/plan contracts, or as the patient moves through the different stages of their insurance plan.   Almarie JULIANNA Pa, CPhT

## 2023-10-26 ENCOUNTER — Ambulatory Visit: Admitting: Physician Assistant

## 2023-10-27 ENCOUNTER — Other Ambulatory Visit (HOSPITAL_COMMUNITY)

## 2023-10-27 ENCOUNTER — Encounter: Payer: Self-pay | Admitting: Cardiology

## 2023-10-27 ENCOUNTER — Other Ambulatory Visit: Payer: Self-pay | Admitting: *Deleted

## 2023-10-27 ENCOUNTER — Ambulatory Visit: Attending: Cardiology | Admitting: Cardiology

## 2023-10-27 ENCOUNTER — Other Ambulatory Visit: Payer: Self-pay | Admitting: Thoracic Surgery (Cardiothoracic Vascular Surgery)

## 2023-10-27 VITALS — BP 105/75 | HR 83 | Resp 16 | Ht 65.0 in | Wt 201.8 lb

## 2023-10-27 DIAGNOSIS — Z953 Presence of xenogenic heart valve: Secondary | ICD-10-CM | POA: Diagnosis not present

## 2023-10-27 DIAGNOSIS — I447 Left bundle-branch block, unspecified: Secondary | ICD-10-CM

## 2023-10-27 DIAGNOSIS — I5022 Chronic systolic (congestive) heart failure: Secondary | ICD-10-CM

## 2023-10-27 DIAGNOSIS — Z794 Long term (current) use of insulin: Secondary | ICD-10-CM

## 2023-10-27 DIAGNOSIS — I359 Nonrheumatic aortic valve disorder, unspecified: Secondary | ICD-10-CM

## 2023-10-27 DIAGNOSIS — Z79899 Other long term (current) drug therapy: Secondary | ICD-10-CM

## 2023-10-27 DIAGNOSIS — I4819 Other persistent atrial fibrillation: Secondary | ICD-10-CM

## 2023-10-27 DIAGNOSIS — Z7901 Long term (current) use of anticoagulants: Secondary | ICD-10-CM

## 2023-10-27 DIAGNOSIS — E1165 Type 2 diabetes mellitus with hyperglycemia: Secondary | ICD-10-CM

## 2023-10-27 DIAGNOSIS — I77810 Thoracic aortic ectasia: Secondary | ICD-10-CM

## 2023-10-27 DIAGNOSIS — I10 Essential (primary) hypertension: Secondary | ICD-10-CM

## 2023-10-27 NOTE — Progress Notes (Signed)
 Cardiology Office Note:  .   ID:  Jonathon Bray, DOB [birth year], MRN [id number] PCP:  Rosamond Leta NOVAK, MD  Former Cardiology Providers: N/A [facility] Providers Cardiologist:  Madonna Large, DO , [facility] (established care 01/18/2022) Electrophysiologist:  None  Click to update primary MD,subspecialty MD or APP then REFRESH:1}    Chief Complaint  Patient presents with   Follow-up    Status post aortic valve replacement. Heart failure and atrial fibrillation management    History of Present Illness: .   Jonathon Bray is a 58 y.o. Caucasian male whose past medical history and cardiovascular risk factors includes: Heart failure with improved EF, status post bioprosthetic aortic valve with Dr. 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   Patient has known history of aortic stenosis which is being monitored by serial echocardiogram.  One of the routine echocardiograms noted reduction in LVEF and progression in severity of aortic stenosis.  Patient underwent aortic valve replacement workup and was referred to Dr. Dalene for further evaluation and management.  Patient was recommended to optimize his glycemic control and obtain a dental clearance prior to proceeding forward.    However due to symptoms of near syncope and syncope patient came to the hospital electively for more expedited evaluation.  He underwent aortic valve replacement with 23 mm Edwards Inspiris Resilia pericardial tissue valve.  Postoperative course managed appropriately and discharged safely.  However when he followed up with his regular cardiothoracic postsurgical/discharge appointment on  10/05/2023 patient was significantly volume overloaded and had weeping blisters in the lower extremities.  He was admitted for IV diuresis and seen by advanced heart failure team.  Repeat echocardiogram noted LVEF 45-50% with global hypokinesis.  During the hospitalization patient went into A-fib with RVR was started on amiodarone  and anticoagulation.   He followed up with advanced heart failure team earlier this week on 10/24/2023.  GDMT was uptitrated and labs were ordered.  Labs from 10/24/2023 independently reviewed which noted hyponatremia and hyperkalemia.  Patient was given Lokelma  and was advised to hold off on initiation of spironolactone .     Review of Systems: .   Review of Systems  Cardiovascular:  Positive for leg swelling. Negative for chest pain, claudication, irregular heartbeat, near-syncope, orthopnea, palpitations, paroxysmal nocturnal dyspnea and syncope.       Lower extremity wounds are healing, see media section  Respiratory:  Negative for shortness of breath.   Hematologic/Lymphatic: Negative for bleeding problem.  Musculoskeletal:  Negative for falls and joint pain.    Studies Reviewed:    Echocardiogram: 05/31/2022: LVEF 60-65%, Moderate to severe aortic valve stenosis (peak velocity 3.70m/s, MG 37.25mmHg, DI 0.20).   06/02/2023: LVEF 35-40%, grade 1 diastolic dysfunction, right ventricular size and function normal, severe aortic stenosis  June 2025: LVEF 20-25%, global hypokinesis, right ventricular size and function normal severe aortic valve stenosis low-flow low gradient.  July 2025 (fingerprint echo): LVEF of 45-50%, LV dyssynchrony,?  Regional wall motion abnormalities, right ventricular size normal systolic function mildly reduced, left atrial size mildly dilated, trivial MR aortic valve replaced with 23 mm pericardial tissue valve with normal function  L/RHCP 06/2023: 1.  Normal right dominant circulation. 2.  Fick cardiac output of 4.2 L/min Fick cardiac index  of 2.1  L/min/m with the following hemodynamics:            Right atrial pressure mean of 19 mmHg            Right ventricular pressure 71/11 with an end-diastolic pressure of 24 mmHg            PA pressure 70/38 with a mean of 52 mmHg            Mean wedge pressure of 33 mmHg V waves to 45 mmHg            PVR 4.5 Woods units            PA pulsatility index of 1.7   Recommendation: Will refer for surgical aortic valve replacement.  Given elevated filling pressures, 60 mg of Lasix  IV x 1 will be administered following the cardiac catheterization and the patient's torsemide  10 mg daily will be increased to 20 mg twice daily.  A BMP will be checked next week.  The patient should be aggressively diuresed prior to surgical aortic valve replacement.   Consider repeat right heart catheterization prior to surgery to ensure volume optimization.   Cardiac MRI  05/11/2022: Modified [facility] Criteria is met for myocarditis.   LGE pattern can also be seen in obstructive coronary disease, but in lieu of this, with slight pericardial thickening and with lack of wall motion abnormality, study is most consistent with myocarditis.   At least moderate aortic stenosis.   Carotid Duplex 02/16/2022: Right Carotid: The extracranial vessels were near-normal with only minimal wall thickening or plaque.  Left Carotid: The extracranial vessels were near-normal with only minimal wall thickening or plaque.  Vertebrals: Bilateral vertebral arteries demonstrate antegrade flow.  Subclavians: Normal flow hemodynamics were seen in bilateral subclavian arteries.   Risk Assessment/Calculations:   NA   Labs:       Latest Ref Rng & Units 10/24/2023   11:51 AM 10/15/2023    3:21 AM 10/14/2023    3:16 AM  CBC  WBC 4.0 - 10.5 K/uL 10.6  8.7  8.6   Hemoglobin 13.0 - 17.0 g/dL 85.4  88.2  88.4   Hematocrit 39.0 - 52.0 % 42.6  35.3  34.8   Platelets 150 - 400 K/uL 369  324  337        Latest Ref Rng & Units  10/24/2023   11:51 AM 10/20/2023   10:39 AM 10/15/2023    3:21 AM  BMP  Glucose 70 - 99 mg/dL 789  821  874   BUN 6 - 20 mg/dL 32  48  37   Creatinine 0.61 - 1.24 mg/dL 8.74  8.46  8.80   Sodium 135 - 145 mmol/L 134  139  135   Potassium 3.5 - 5.1 mmol/L 5.9  5.3  4.2   Chloride 98 - 111 mmol/L 98  96  98   CO2 22 - 32 mmol/L 29  31  28    Calcium  8.9 - 10.3 mg/dL 9.4  9.6  8.6       Latest Ref Rng & Units 10/24/2023   11:51 AM 10/20/2023   10:39 AM 10/15/2023    3:21 AM  CMP  Glucose 70 - 99 mg/dL 789  821  874   BUN 6 - 20 mg/dL 32  48  37   Creatinine 0.61 - 1.24 mg/dL 8.74  8.46  8.80   Sodium 135 - 145 mmol/L 134  139  135   Potassium 3.5 - 5.1  mmol/L 5.9  5.3  4.2   Chloride 98 - 111 mmol/L 98  96  98   CO2 22 - 32 mmol/L 29  31  28    Calcium  8.9 - 10.3 mg/dL 9.4  9.6  8.6     Lab Results  Component Value Date   CHOL 116 05/10/2022   HDL 45 05/10/2022   LDLCALC 58 05/10/2022   LDLDIRECT 119 (H) 02/22/2022   TRIG 63 05/10/2022   CHOLHDL 2.6 05/10/2022   No results for input(s): LIPOA in the last 8760 hours. No components found for: NTPROBNP Recent Labs    06/06/23 1633 06/28/23 1402 07/27/23 1314  PROBNP 1,093* 1,172* 1,063*   No results for input(s): TSH in the last 8760 hours.  External Labs: Collected: 05/08/2023 [facility] health care Care Everywhere Sodium 135, potassium 5.2, chloride 96, bicarb 30.7. BUN 35, creatinine 1.45. eGFR 56. Blood glucose 473. AST 9, ALT 16, alkaline phosphatase 76 Hemoglobin 15.3 g/dL BNP 101  Collected: 0/0/7975  A1c 10.8  Physical Exam:    Today's Vitals   10/27/23 1600  BP: 105/75  Pulse: 83  Resp: 16  SpO2: 96%  Weight: 201 lb 12.8 oz (91.5 kg)  Height: 5' 5 (1.651 m)   Body mass index is 33.58 kg/m. Wt Readings from Last 3 Encounters:  10/27/23 201 lb 12.8 oz (91.5 kg)  10/24/23 207 lb (93.9 kg)  10/20/23 198 lb (89.8 kg)    Physical Exam  Constitutional: No distress. He appears chronically ill.   Appears older than stated age, hemodynamically stable, presents in wheelchair  HENT:  Poor dental hygiene  Neck: No JVD present.  Cardiovascular: Normal rate, regular rhythm, S1 normal, S2 normal and intact distal pulses. Exam reveals no gallop, no S3 and no S4.  No murmur heard. Pulmonary/Chest: Effort normal and breath sounds normal. No stridor. He has no wheezes. He has no rales.  Abdominal: Soft. Bowel sounds are normal. He exhibits no distension. There is no abdominal tenderness.  Musculoskeletal:        General: Edema (Trace bilateral) present.     Cervical back: Neck supple.     Comments: Ecchymosis and bilateral lower extremity wounds left greater than right.  Granulation tissue present.  No active drainage or malodorous odor  Neurological: He is alert and oriented to person, place, and time. He has intact cranial nerves (2-12).  Skin: Skin is warm and moist.   Impression & Recommendation(s):  Impression:   ICD-10-CM   1. Aortic valve disease  I35.9     2. S/P aortic valve replacement with bioprosthetic valve  Z95.3     3. Chronic heart failure with mildly reduced ejection fraction (HFmrEF, 41-49%) (HCC)  I50.22     4. LBBB (left bundle branch block)  I44.7     5. Essential hypertension  I10     6. Type 2 diabetes mellitus with hyperglycemia, with long-term current use of insulin  (HCC)  E11.65    Z79.4     7. Ascending aorta dilatation (HCC)  I77.810     8. Persistent atrial fibrillation (HCC)  I48.19     9. Long term current use of antiarrhythmic drug  Z79.899     10. Long term (current) use of anticoagulants  Z79.01        Recommendation(s):  Aortic valve disease S/P aortic valve replacement with bioprosthetic valve As per the intraoperative findings: severely stenotic, heavily calcified, grossly disfigured aortic valve bicuspid with fusion of right and non-coronary cusps, oversized left  coronary cusp, moderate annular calcification. Status post aortic valve  replacement with 23 mm Edwards Inspiris Resilia pericardial tissue valve (model #11500A, serial number [id number]) on 09/22/2023 Fingerprint echo reviewed Antibiotic prophylaxis will be needed prior to dental procedures Given the open wounds/venous ulcers recommended being conscientious of infectious symptoms and to seek medical attention immediately Monitor for now  Chronic heart failure with mildly reduced ejection fraction (HFmrEF, 41-49%) (HCC) LBBB (left bundle branch block) Etiology likely multifactorial: Underlying valvular heart disease versus LBBB No significant obstructive disease on heart catheterization prior to aortic valve replacement 05/31/2022: LVEF 60-65%, 06/02/2023: LVEF 35-40% June 2025: LVEF 20-25% July 2025 (fingerprint echo): LVEF of 45-50% Currently being followed by advanced heart failure team, last office visit 10/24/2023, progress note reviewed. Office blood pressures are soft and unable to uptitrate GDMT for right now. Initial plan was to start spironolactone  but held secondary to hyperkalemia. Patient had labs earlier today prior to the visit for follow-up Continue torsemide  20 mg every other day. Continue Entresto  24/26 mg p.o. twice daily. Continue Toprol -XL 25 mg p.o. daily. Has not tolerated Invokana or Farxiga  in the past as discussed above Patient will continue to follow-up with advanced heart failure team for now as originally planned.  Essential hypertension Office blood pressures are soft. Medications as discussed above.   Type 2 diabetes mellitus with hyperglycemia, with long-term current use of insulin  (HCC) Currently on insulin  therapy. Reemphasize importance of glycemic control. 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  Cardiac MRI February 2024: 39 mm ascending aorta Echocardiogram February 2025: Ascending aorta 36 mm Reemphasized importance of blood pressure management. Currently on ARNI as well as Toprol -XL Patient is aware of symptoms suggestive of aortic syndrome and if present should go to the closest ER via EMS. You should avoid use of Ciprofloxacin and other associated antibiotics (flouroquinolone antibiotics ) It is  best to avoid activities that cause grunting or straining (medically referred to as a "valsalva maneuver"). This happens when a person bears down against a closed throat to increase the strength of arm or abdominal muscles. There's often a tendency to do this when lifting heavy weights, doing sit-ups, push-ups or chin-ups, etc., but it may be harmful.   Persistent atrial fibrillation (HCC) Long term current use of antiarrhythmic drug Long term (current) use of anticoagulants Rate control: Metoprolol . Rhythm control: Amiodarone  Thromboembolic prophylaxis: Eliquis  Patient is aware that amiodarone  requires long-term monitoring Risks, benefits, and alternatives to anticoagulation discussed Monitor H&H and renal function and TSH and LFTs every 6 months. Recommend annual PFTs, chest x-ray, and eye exams  Patient lives in [place]  and therefore cardiac rehab and wound care clinics follow-ups are hard to arrange.  Patient states that he is supposed to have cardiac rehab at [facility] .  I have asked him to reach out to them to arrange care our office to be more than happy to arrange any referrals if needed.  In addition, I have asked him to follow-up with PCP to see if they can help coordinate home health care for wound management and glycemic control as well.  To minimize confusion I recommended that he follows up with advanced heart failure who has been graciously managing his uptitration of  GDMT given the recent aortic valve replacement.  Once he graduates from their practice I will see him again for longitudinal care.  Patient agreeable with the plan.  Total time spent: 40 minutes Reviewed the intraoperative report from 09/22/2023. Discharge summary Advanced heart failure follow-up note 10/24/2023. Independently reviewed labs from 10/24/2023. Independently reviewed echocardiogram from 10/06/2023 Prescription drug management Coordination of care  Orders Placed:  No orders of the defined types were placed in this encounter.  Final Medication List:    No orders of the defined types were placed in this encounter.   Medications Discontinued During This Encounter  Medication Reason   spironolactone  (ALDACTONE ) 25 MG tablet Discontinued by provider      Current Outpatient Medications:    acetaminophen  (TYLENOL ) 325 MG tablet, Take 2 tablets (650 mg total) by mouth every 6 (six) hours as needed for mild pain (pain score 1-3)., Disp: , Rfl:    amiodarone  (PACERONE ) 200 MG tablet, Take 1 tablet (200 mg total) by mouth daily., Disp: 90 tablet, Rfl: 3   apixaban  (ELIQUIS ) 5 MG TABS tablet, Take 1 tablet (5 mg total) by mouth 2 (two) times daily., Disp: 60 tablet, Rfl: 1   aspirin  81 MG chewable tablet, Chew 1 tablet (81 mg total) by mouth daily., Disp: , Rfl:    atorvastatin  (LIPITOR) 40 MG tablet, Take 1 tablet (40 mg total) by mouth at bedtime., Disp: 90 tablet, Rfl: 3   gabapentin  (NEURONTIN ) 100 MG capsule, Take 1 capsule (100 mg total) by mouth 2 (two) times daily. If you feel you no longer require this medication for pain please DO NOT stop abruptly. Please wean to 1 tablet daily for 1 week and then discontinue., Disp: , Rfl:    insulin  lispro (HUMALOG ) 100 UNIT/ML KwikPen, Inject 16 Units into the skin 3 (three) times daily., Disp: , Rfl:    metoprolol  succinate (TOPROL  XL) 25 MG 24 hr tablet, Take 1 tablet (25 mg total) by mouth daily., Disp: 60 tablet, Rfl: 1   OZEMPIC, 0.25  OR 0.5 MG/DOSE, 2 MG/3ML SOPN, Inject 0.25 mg into the skin once a week., Disp: , Rfl:    sacubitril -valsartan  (ENTRESTO ) 24-26 MG, Take 1 tablet by mouth 2 (two) times daily., Disp: 60 tablet, Rfl: 1   torsemide  (DEMADEX ) 20 MG tablet, Take 1 tablet (20 mg total) by mouth every other day., Disp: , Rfl:    TRESIBA FLEXTOUCH 200 UNIT/ML FlexTouch Pen, Inject 54 Units into the skin daily., Disp: , Rfl:   Consent:   NA  Disposition:   38-month follow-up sooner if needed His questions and concerns were addressed to his satisfaction. He voices understanding of the recommendations provided during this encounter.    Signed, Madonna Michele HAS, [facility]  A Division of [facility] [address] [place], [place] [address]  [place], [place] [address]

## 2023-10-27 NOTE — Patient Instructions (Addendum)
 Follow-Up: At [facility], you and your health needs are our priority.  As part of our continuing mission to provide you with exceptional heart care, our providers are all part of one team.  This team includes your primary Cardiologist (physician) and Advanced Practice Providers or APPs (Physician Assistants and Nurse Practitioners) who all work together to provide you with the care you need, when you need it.  Your next appointment:   6 months  Provider:   Madonna Large, DO    We recommend signing up for the patient portal called MyChart.  Sign up information is provided on this After Visit Summary.  MyChart is used to connect with patients for Virtual Visits (Telemedicine).  Patients are able to view lab/test results, encounter notes, upcoming appointments, etc.  Non-urgent messages can be sent to your provider as well.   To learn more about what you can do with MyChart, go to [email or web address].   Other Instructions Contact [facility] to see if they have a cardiac rehab. If so, let our office know and we will send a referral.  Contact your primary care provider to establish wound care for your legs.  Continue care with Heart Failure clinic.

## 2023-10-28 LAB — BASIC METABOLIC PANEL WITH GFR
BUN/Creatinine Ratio: 25 — ABNORMAL HIGH (ref 9–20)
BUN: 28 mg/dL — ABNORMAL HIGH (ref 6–24)
CO2: 26 mmol/L (ref 20–29)
Calcium: 9 mg/dL (ref 8.7–10.2)
Chloride: 100 mmol/L (ref 96–106)
Creatinine, Ser: 1.14 mg/dL (ref 0.76–1.27)
Glucose: 110 mg/dL — ABNORMAL HIGH (ref 70–99)
Potassium: 5.3 mmol/L — ABNORMAL HIGH (ref 3.5–5.2)
Sodium: 138 mmol/L (ref 134–144)
eGFR: 75 mL/min/1.73 (ref >59)

## 2023-11-01 ENCOUNTER — Telehealth: Payer: Self-pay | Admitting: *Deleted

## 2023-11-01 ENCOUNTER — Ambulatory Visit (HOSPITAL_COMMUNITY)
Admission: RE | Admit: 2023-11-01 | Discharge: 2023-11-01 | Disposition: A | Discharge status: Home / Self Care | Source: Ambulatory Visit | Attending: Cardiology | Admitting: Cardiology

## 2023-11-01 ENCOUNTER — Ambulatory Visit

## 2023-11-01 ENCOUNTER — Encounter: Payer: Self-pay | Admitting: *Deleted

## 2023-11-01 ENCOUNTER — Ambulatory Visit: Payer: 59 | Admitting: Neurology

## 2023-11-01 ENCOUNTER — Encounter: Payer: Self-pay | Admitting: Neurology

## 2023-11-01 ENCOUNTER — Ambulatory Visit: Admitting: Thoracic Surgery (Cardiothoracic Vascular Surgery)

## 2023-11-01 VITALS — BP 126/87 | HR 66 | Resp 20 | Ht 65.0 in | Wt 215.7 lb

## 2023-11-01 DIAGNOSIS — Z953 Presence of xenogenic heart valve: Secondary | ICD-10-CM | POA: Insufficient documentation

## 2023-11-01 DIAGNOSIS — Z952 Presence of prosthetic heart valve: Secondary | ICD-10-CM

## 2023-11-01 MED ORDER — GABAPENTIN 100 MG PO CAPS: 100.0000 mg | ORAL_CAPSULE | Freq: Two times a day (BID) | ORAL | 0 refills | Status: DC

## 2023-11-01 NOTE — Patient Instructions (Signed)
Endocarditis is a potentially serious infection of heart valves or inside lining of the heart.  It occurs more commonly in patients with diseased heart valves (such as patient's with aortic or mitral valve disease) and in patients who have undergone heart valve repair or replacement.  Certain surgical and dental procedures may put you at risk, such as dental cleaning, other dental procedures, or any surgery involving the respiratory, urinary, gastrointestinal tract, gallbladder or prostate gland.   To minimize your chances for develooping endocarditis, maintain good oral health and seek prompt medical attention for any infections involving the mouth, teeth, gums, skin or urinary tract.    Always notify your doctor or dentist about your underlying heart valve condition before having any invasive procedures. You will need to take antibiotics before certain procedures, including all routine dental cleanings or other dental procedures.  Your cardiologist or dentist should prescribe these antibiotics for you to be taken ahead of time.       

## 2023-11-01 NOTE — Telephone Encounter (Signed)
 Disability paperwork completed and faxed to [employer] at [phone]

## 2023-11-01 NOTE — Progress Notes (Signed)
 [address] [place] [address]             [phone]       HPI: [name] is a 58 year old male with medical history of HTN, NSTEMI, heart failure, type 2 DM, stage II chronic kidney disease, and hyperlipidemia who presents to the office for post operative follow up.  Patient had a bioprosthetic aortic valve replacement on 09/22/2023 by Dr. Kerrin with a 23 mm Edwards Inspiris Resilia pericardial tissue valve. He was discharged from the hospital on 09/28/2023 on torsemide  with Unna boots in place to mitigate the lower extremity edema.  He then was seen in the clinic on 10/05/2023 for follow up where he had lower leg edema with open blisters on the anterior surface of both shins and his feet. He was unable to tolerate the Unna boots due to discomfort.  He was then readmitted to the hospital for wound care since he was unable to see outpatient wound care for 2 weeks.  He was discharged on 10/15/2023 and the right leg wound was scabbed over and dry with no evidence of retained fluid. The left leg blister had been debrided with dressing changes and exposing a clean, moist skin layer that was healing with no sign of complication. Peripheral edema had resolved.   Since hospital discharge the patient reports that he has been doing well.  He has been seen by heart failure clinic and also cardiology.  They have been making slight changes to his medications.  He has been tending to his lower leg wounds and they are almost completely resolved at this time.  He reports that his blood sugars are still wide in range.  He reports blood sugars as high as 250.  He is using insulin  to try to get better glycemic control.  He has been slowly increasing his exercise by walking around his house and has been using a cane to help with stability.  He denies chest pain, shortness of breath, lower leg swelling.   Current Outpatient Medications  Medication Sig Dispense Refill    acetaminophen  (TYLENOL ) 325 MG tablet Take 2 tablets (650 mg total) by mouth every 6 (six) hours as needed for mild pain (pain score 1-3).     amiodarone  (PACERONE ) 200 MG tablet Take 1 tablet (200 mg total) by mouth daily. 90 tablet 3   apixaban  (ELIQUIS ) 5 MG TABS tablet Take 1 tablet (5 mg total) by mouth 2 (two) times daily. 60 tablet 1   aspirin  81 MG chewable tablet Chew 1 tablet (81 mg total) by mouth daily.     atorvastatin  (LIPITOR) 40 MG tablet Take 1 tablet (40 mg total) by mouth at bedtime. 90 tablet 3   insulin  lispro (HUMALOG ) 100 UNIT/ML KwikPen Inject 16 Units into the skin 3 (three) times daily.     metoprolol  succinate (TOPROL  XL) 25 MG 24 hr tablet Take 1 tablet (25 mg total) by mouth daily. 60 tablet 1   OZEMPIC, 0.25 OR 0.5 MG/DOSE, 2 MG/3ML SOPN Inject 0.25 mg into the skin once a week.     sacubitril -valsartan  (ENTRESTO ) 24-26 MG Take 1 tablet by mouth 2 (two) times daily. 60 tablet 1   torsemide  (DEMADEX ) 20 MG tablet Take 1 tablet (20 mg total) by mouth every other day.     TRESIBA FLEXTOUCH 200 UNIT/ML FlexTouch Pen Inject 54 Units into the skin daily.     gabapentin  (NEURONTIN )  100 MG capsule Take 1 capsule (100 mg total) by mouth 2 (two) times daily. If you feel you no longer require this medication for pain please DO NOT stop abruptly. Please wean to 1 tablet daily for 1 week and then discontinue. 60 capsule 0   No current facility-administered medications for this visit.    Vitals:   11/01/23 1041  BP: 126/87  Pulse: 66  Resp: 20  SpO2: 96%   Review of Systems  Respiratory: Negative.  Negative for cough and shortness of breath.   Cardiovascular: Negative.  Negative for chest pain, palpitations and leg swelling.  Neurological: Negative.  Negative for dizziness.  All other systems reviewed and are negative.   Physical Exam: Physical Exam Constitutional:      Appearance: Normal appearance.  HENT:     Head: Normocephalic and atraumatic.  Cardiovascular:      Rate and Rhythm: Normal rate and regular rhythm.     Heart sounds: S1 normal and S2 normal. Murmur heard.     Comments: Soft systolic murmur consistent with the bioprosthetic aortic valve Skin:    General: Skin is warm and dry.      Neurological:     General: No focal deficit present.     Mental Status: He is alert and oriented to person, place, and time.      Diagnostic Tests: CLINICAL DATA:  Status post aortic valve replacement.   EXAM: CHEST - 2 VIEW   COMPARISON:  Chest radiograph dated 10/05/2023   FINDINGS: Shallow inspiration. No focal consolidation, pleural effusion or pneumothorax. The cardiac silhouette is within limits. Median sternotomy wires and mechanical aortic valve. No acute osseous pathology.   IMPRESSION: No active cardiopulmonary disease.     Electronically Signed   By: Vanetta Chou M.D.   On: 11/01/2023 11:52   Plan: S/P AVR (aortic valve replacement)  We reviewed today's chest x ray. We discussed driving and he is able to start driving since he is no longer taking narcotics for pain.  He should start by going short distances in the day time and can increase from there. He would like to participate in cardiac rehab and he would like to participate in [place], [place]  if possible since that is where he lives.  He should continue to follow sternal precautions until a full 8 weeks from surgery (11/17/2023).  Provided patient to refill gabapentin  today for pain.  Discussed the need for good glucose control.  He is to continue to use insulin  as prescribed and is to call his PCP since he is unable to pay for his Ozempic at this time.  He has been seen by cardiology and heart failure clinic. They have been making adjustments to his medications based on lab results. He has continued follow-up with the heart failure clinic in the next month.  He has echocardiogram scheduled on 11/08/2023.  He has a follow-up appointment with Dr. Kerrin in 1 month.   Provided letter for patient with return to work date.  Also filled out disability forms and faxed. Will need prophylaxis of antibiotics before dental procedures and some surgeries.   [name], PA-C [facility] [phone]

## 2023-11-08 ENCOUNTER — Ambulatory Visit (HOSPITAL_COMMUNITY)
Admission: RE | Admit: 2023-11-08 | Discharge: 2023-11-08 | Disposition: A | Discharge status: Home / Self Care | Source: Ambulatory Visit | Attending: Cardiology | Admitting: Cardiology

## 2023-11-08 DIAGNOSIS — Z953 Presence of xenogenic heart valve: Secondary | ICD-10-CM | POA: Insufficient documentation

## 2023-11-08 LAB — ECHOCARDIOGRAM COMPLETE
AR max vel: 2.42 cm2
AV Area VTI: 2.41 cm2
AV Area mean vel: 2.36 cm2
AV Mean grad: 6.9 mmHg
AV Peak grad: 12.8 mmHg
Ao pk vel: 1.79 m/s
Area-P 1/2: 3.99 cm2
S' Lateral: 3.1 cm

## 2023-11-10 ENCOUNTER — Other Ambulatory Visit (HOSPITAL_COMMUNITY): Payer: Self-pay

## 2023-11-16 ENCOUNTER — Other Ambulatory Visit (HOSPITAL_COMMUNITY): Payer: Self-pay

## 2023-11-16 DIAGNOSIS — I5022 Chronic systolic (congestive) heart failure: Secondary | ICD-10-CM

## 2023-11-18 ENCOUNTER — Telehealth (HOSPITAL_COMMUNITY): Payer: Self-pay

## 2023-11-18 NOTE — Progress Notes (Signed)
 ADV[facility]ED HF CLINIC NOTE   Primary Care: Rosamond Leta NOVAK, MD Primary Cardiologist: Madonna Large, DO HF Cardiologist: Dr. Rolan  HPI: 58 y.o. male with history of HFrEF, HTN, T2DM, CKD 2, and aortic stenosis s/p bioprothestic AVR with Dr. Kerrin on 09/22/23. Echo post-op with EF 50% and nl RV, previously 20-25%.   Since procedure he has been struggling with significant lower extremity edema. He was discharge home with unna boots and torsemide . Attempts were made to have home health nursing and PT see him, however they were unable to visit since he lives in [place] .    He was seen 10/05/23 for post-op follow up in [facility] with continued significant lower extremity volume + weeping and blisters. Admitted for IV diuresis, AHF consulted. Echo showed EF 45-50%, significant LV dyssynchrony, LV with GHK, RV mildly reduced, LA mildly dilated, trivial MR, normal structure and function of AV prosthesis. He was diuresed with IV lasix , and GDMT titrated. Received IV cefazolin  for venous stasis ulcers. Hospitalization complicated by AF with RVR, loaded with PO amiodarone . He was discharged home, weight 197 lbs.  Post-Op echo 11/08/23 showed EF 50-55%, RV mildly reduced,  stable AoV prosthesis with mean gradient 6.9 mmHg.   Today he returns for HF follow up with his wife. Overall feeling fine. Legs feel weak. Doing CR and gets worn out after sessions. Left hand is weak, on-going since surgery. He has rare incisional chest soreness when he coughs. Leg swelling greatly improved. Denies palpitations, abnormal bleeding, CP, dizziness, edema, or PND/Orthopnea. Appetite ok. Weight at home 198-200 pounds. Taking all medications. Doing cardiac rehab in [place].   ECG (personally reviewed): none ordered today  Labs (8/25): K 5.3, creatinine 1.42  Cardiac Studies - Echo (11/08/23):  EF 50-55%, RV mildly reduced,  stable AoV prosthesis with mean gradient 6.9 mmHg.   - Echo 10/06/23: EF 45-50%, significant LV  dyssynchrony, LV with GHK, RV mildly reduced, LA mildly dilated, trivial MR, normal structure and function of AV prosthesis.  - Intra-Op TEE 09/22/23: EF 50%, RV normal  - Echo 09/11/23: EF 20-25%, RV normal, mild AI, severe AS LFLG, AoV mean gradient 21.0 mmHg  - R/LHC 3/35: normal cors; RA 19, PA 70/38 (52), PCWP 33 with V waves to 45 mmHg, PVR 4.5 WU, PAPi 1.7, CO/CI (Fick) 4.2/2.1  - Echo 2/25: EF 35-40%, RV normal, severe AS with mean gradient 47 mmhg  Past Medical History:  Diagnosis Date   Aortic stenosis    Diabetes mellitus without complication (HCC)    type two - LIBRE SYSTEM ON RIGHT UPPER OUTER ARM   Hypertension    PONV (postoperative nausea and vomiting)    Current Outpatient Medications  Medication Sig Dispense Refill   acetaminophen  (TYLENOL ) 325 MG tablet Take 2 tablets (650 mg total) by mouth every 6 (six) hours as needed for mild pain (pain score 1-3).     amiodarone  (PACERONE ) 200 MG tablet Take 1 tablet (200 mg total) by mouth daily. 90 tablet 3   apixaban  (ELIQUIS ) 5 MG TABS tablet Take 1 tablet (5 mg total) by mouth 2 (two) times daily. 60 tablet 1   aspirin  81 MG chewable tablet Chew 1 tablet (81 mg total) by mouth daily.     atorvastatin  (LIPITOR) 40 MG tablet Take 1 tablet (40 mg total) by mouth at bedtime. 90 tablet 3   gabapentin  (NEURONTIN ) 100 MG capsule Take 1 capsule (100 mg total) by mouth 2 (two) times daily. If you feel you no  longer require this medication for pain please DO NOT stop abruptly. Please wean to 1 tablet daily for 1 week and then discontinue. 60 capsule 0   insulin  lispro (HUMALOG ) 100 UNIT/ML KwikPen Inject 16 Units into the skin 3 (three) times daily.     metoprolol  succinate (TOPROL  XL) 25 MG 24 hr tablet Take 1 tablet (25 mg total) by mouth daily. 60 tablet 1   sacubitril -valsartan  (ENTRESTO ) 24-26 MG Take 1 tablet by mouth 2 (two) times daily. 60 tablet 1   torsemide  (DEMADEX ) 20 MG tablet Take 1 tablet (20 mg total) by mouth every other  day.     TRESIBA FLEXTOUCH 200 UNIT/ML FlexTouch Pen Inject 54 Units into the skin daily.     OZEMPIC, 0.25 OR 0.5 MG/DOSE, 2 MG/3ML SOPN Inject 0.25 mg into the skin once a week. (Patient not taking: Reported on 11/21/2023)     No current facility-administered medications for this encounter.   Allergies  Allergen Reactions   Invokana [Canagliflozin] Other (See Comments)    Dizziness Syncope    Levaquin [Levofloxacin] Rash   Zestril  [Lisinopril ] Cough   Farxiga  [Dapagliflozin ] Other (See Comments)    dizziness   Social History   Socioeconomic History   Marital status: Married    Spouse name: Not on file   Number of children: 3   Years of education: Not on file   Highest education level: Not on file  Occupational History   Not on file  Tobacco Use   Smoking status: Never   Smokeless tobacco: Never  Vaping Use   Vaping status: Never Used  Substance and Sexual Activity   Alcohol use: No   Drug use: No   Sexual activity: Yes  Other Topics Concern   Not on file  Social History Narrative   Not on file   Social Drivers of Health   Financial Resource Strain: Not on file  Food Insecurity: No Food Insecurity (10/05/2023)   Hunger Vital Sign    Worried About Running Out of Food in the Last Year: Never true    Ran Out of Food in the Last Year: Never true  Transportation Needs: No Transportation Needs (10/05/2023)   PRAPARE - Administrator, Civil Service (Medical): No    Lack of Transportation (Non-Medical): No  Physical Activity: Not on file  Stress: Not on file  Social Connections: Unknown (08/17/2021)   Received [facility]   Social Network    Social Network: Not on file  Intimate Partner Violence: Not At Risk (10/05/2023)   Humiliation, Afraid, Rape, and Kick questionnaire    Fear of Current or Ex-Partner: No    Emotionally Abused: No    Physically Abused: No    Sexually Abused: No   Family History  Problem Relation Age of Onset   Diabetes Mother     Diabetes Father    Heart disease Father    Heart attack Sister    Hypertension Sister    Heart attack Brother    Other Brother 59       Shot in the head   Other Brother 5       Polio   Diabetes Brother    Wt Readings from Last 3 Encounters:  11/21/23 97.8 kg (215 lb 9.6 oz)  11/01/23 97.8 kg (215 lb 11.2 oz)  10/27/23 91.5 kg (201 lb 12.8 oz)   BP 108/78 (BP Location: Left Arm, Patient Position: Sitting)   Pulse (!) 59   Ht 5' 5 (  1.651 m)   Wt 97.8 kg (215 lb 9.6 oz)   BMI 35.88 kg/m   PHYSICAL EXAM: General:  NAD. No resp difficulty, arrived in [facility] HEENT: Normal Neck: Supple. No JVD. Cor: Regular rate & rhythm. No rubs, gallops or murmurs. Lungs: Clear Abdomen: Soft, obese, nontender, nondistended.  Extremities: No cyanosis, clubbing, rash, trace BLE edema Neuro: Alert & oriented x 3, moves all 4 extremities w/o difficulty. LUE grip 4/5. Affect pleasant.  ASSESSMENT & PLAN: 1. Chronic systolic CHF: Nonischemic cardiomyopathy.  ?Valvular related to severe AS versus LBBB cardiomyopathy.  Cath in pre-AVR showed no significant CAD but markedly elevated filling pressures.  Echo in 6/25 (pre-op) showed EF 20-25% with septal-lateral dyssynchrony, normal RV function, severe aortic stenosis.  Patient is now s/p bioprosthetic AVR.  Intra-op TEE reported EF 50%. 2D echo post-op, 10/06/23 echo showed EF 45-50%, significant LV dyssynchrony, LV with GHK, RV mildly reduced, LA mildly dilated, trivial MR, normal structure and function of AV prosthesis. Post op echo (11/08/23):  EF 50-55%, RV mildly reduced,  stable AoV prosthesis with mean gradient 6.9 mmHg. NYHA II, limited by leg weakness. He is not volume overloaded by exam. - Off spiro with hyperkalemia. - Continue torsemide  20 mg every other day. BMET and BNP today. - Continue Toprol  XL 25 mg daily - Continue Entresto  24-26 mg bid - Allergy to Farxiga  and Invokana (syncope/dizziness).  2. Aortic stenosis: S/p bioprosthetic AVR in 6/25.   - Normal structure and function of AV prosthesis. AV mean gradient 6.8 mm HG on recent echo. 3. CKD stage 3: Creatinine baseline ~ 1.5-1.7.  - Labs today, with recent hyperkalemia. 4. Venous stasis ulcers: Received IV abx during recent admission. Legs healing, no open areas. Swelling improved. 5. Atrial fibrillation: Had AF with RVR this admission. Regular on exam today - Decrease amiodarone  to 100 mg daily. Will need regular eye exam while on amiodarone . Check LFTs and TSH today. Hopefully can stop in near future. - Continue Eliquis  5 mg bid. No bleeding issues.   Follow up in 2 months with Dr. Rolan.    Harlene Gainer, FNP-BC 11/21/23

## 2023-11-18 NOTE — Telephone Encounter (Signed)
 Called to confirm/remind patient of their appointment at the [facility] on 11/21/23.   Appointment:   [] Confirmed  [x] Left mess   [] No answer/No voice mail  [] VM Full/unable to leave message  [] Phone not in service  And to bring in all medications and/or complete list.

## 2023-11-19 LAB — BASIC METABOLIC PANEL WITH GFR
BUN/Creatinine Ratio: 23 — ABNORMAL HIGH (ref 9–20)
BUN: 32 mg/dL — ABNORMAL HIGH (ref 6–24)
CO2: 28 mmol/L (ref 20–29)
Calcium: 9.2 mg/dL (ref 8.7–10.2)
Chloride: 97 mmol/L (ref 96–106)
Creatinine, Ser: 1.42 mg/dL — ABNORMAL HIGH (ref 0.76–1.27)
Glucose: 292 mg/dL — ABNORMAL HIGH (ref 70–99)
Potassium: 5.3 mmol/L — ABNORMAL HIGH (ref 3.5–5.2)
Sodium: 138 mmol/L (ref 134–144)
eGFR: 58 mL/min/1.73 — ABNORMAL LOW (ref >59)

## 2023-11-21 ENCOUNTER — Ambulatory Visit (HOSPITAL_COMMUNITY)
Admission: RE | Admit: 2023-11-21 | Discharge: 2023-11-21 | Disposition: A | Discharge status: Home / Self Care | Source: Ambulatory Visit | Attending: Family Medicine

## 2023-11-21 ENCOUNTER — Ambulatory Visit (HOSPITAL_COMMUNITY): Payer: Self-pay | Admitting: Family Medicine

## 2023-11-21 ENCOUNTER — Encounter (HOSPITAL_COMMUNITY): Payer: Self-pay

## 2023-11-21 VITALS — BP 108/78 | HR 59 | Ht 65.0 in | Wt 215.6 lb

## 2023-11-21 DIAGNOSIS — I5022 Chronic systolic (congestive) heart failure: Secondary | ICD-10-CM | POA: Insufficient documentation

## 2023-11-21 DIAGNOSIS — Z7901 Long term (current) use of anticoagulants: Secondary | ICD-10-CM | POA: Diagnosis not present

## 2023-11-21 DIAGNOSIS — I872 Venous insufficiency (chronic) (peripheral): Secondary | ICD-10-CM | POA: Insufficient documentation

## 2023-11-21 DIAGNOSIS — I428 Other cardiomyopathies: Secondary | ICD-10-CM | POA: Diagnosis not present

## 2023-11-21 DIAGNOSIS — N183 Chronic kidney disease, stage 3 unspecified: Secondary | ICD-10-CM | POA: Diagnosis not present

## 2023-11-21 DIAGNOSIS — Z952 Presence of prosthetic heart valve: Secondary | ICD-10-CM | POA: Insufficient documentation

## 2023-11-21 DIAGNOSIS — L97909 Non-pressure chronic ulcer of unspecified part of unspecified lower leg with unspecified severity: Secondary | ICD-10-CM | POA: Insufficient documentation

## 2023-11-21 DIAGNOSIS — E1122 Type 2 diabetes mellitus with diabetic chronic kidney disease: Secondary | ICD-10-CM | POA: Insufficient documentation

## 2023-11-21 DIAGNOSIS — I130 Hypertensive heart and chronic kidney disease with heart failure and stage 1 through stage 4 chronic kidney disease, or unspecified chronic kidney disease: Secondary | ICD-10-CM | POA: Insufficient documentation

## 2023-11-21 DIAGNOSIS — I480 Paroxysmal atrial fibrillation: Secondary | ICD-10-CM

## 2023-11-21 DIAGNOSIS — Z794 Long term (current) use of insulin: Secondary | ICD-10-CM | POA: Insufficient documentation

## 2023-11-21 DIAGNOSIS — I350 Nonrheumatic aortic (valve) stenosis: Secondary | ICD-10-CM | POA: Diagnosis not present

## 2023-11-21 DIAGNOSIS — I4891 Unspecified atrial fibrillation: Secondary | ICD-10-CM | POA: Insufficient documentation

## 2023-11-21 DIAGNOSIS — I878 Other specified disorders of veins: Secondary | ICD-10-CM

## 2023-11-21 LAB — COMPREHENSIVE METABOLIC PANEL WITH GFR
ALT: 14 U/L (ref 0–44)
AST: 13 U/L — ABNORMAL LOW (ref 15–41)
Albumin: 3.4 g/dL — ABNORMAL LOW (ref 3.5–5.0)
Alkaline Phosphatase: 54 U/L (ref 38–126)
Anion gap: 9 (ref 5–15)
BUN: 34 mg/dL — ABNORMAL HIGH (ref 6–20)
CO2: 31 mmol/L (ref 22–32)
Calcium: 9.7 mg/dL (ref 8.9–10.3)
Chloride: 98 mmol/L (ref 98–111)
Creatinine, Ser: 1.47 mg/dL — ABNORMAL HIGH (ref 0.61–1.24)
GFR, Estimated: 55 mL/min — ABNORMAL LOW (ref >60)
Glucose, Bld: 175 mg/dL — ABNORMAL HIGH (ref 70–99)
Potassium: 5.2 mmol/L — ABNORMAL HIGH (ref 3.5–5.1)
Sodium: 138 mmol/L (ref 135–145)
Total Bilirubin: 0.6 mg/dL (ref 0.0–1.2)
Total Protein: 7.2 g/dL (ref 6.5–8.1)

## 2023-11-21 LAB — BRAIN NATRIURETIC PEPTIDE: B Natriuretic Peptide: 115.2 pg/mL — ABNORMAL HIGH (ref 0.0–100.0)

## 2023-11-21 LAB — TSH: TSH: 2.759 u[IU]/mL (ref 0.350–4.500)

## 2023-11-21 MED ORDER — AMIODARONE HCL 200 MG PO TABS: 200.0000 mg | ORAL_TABLET | Freq: Every day | ORAL | 3 refills | Status: DC

## 2023-11-21 MED ORDER — METOPROLOL SUCCINATE ER 25 MG PO TB24: 25.0000 mg | ORAL_TABLET | Freq: Every day | ORAL | 11 refills | Status: DC

## 2023-11-21 MED ORDER — ATORVASTATIN CALCIUM 40 MG PO TABS: 40.0000 mg | ORAL_TABLET | Freq: Every day | ORAL | 3 refills | Status: AC

## 2023-11-21 MED ORDER — APIXABAN 5 MG PO TABS: 5.0000 mg | ORAL_TABLET | Freq: Two times a day (BID) | ORAL | 11 refills | Status: AC

## 2023-11-21 MED ORDER — SACUBITRIL-VALSARTAN 24-26 MG PO TABS: 1.0000 | ORAL_TABLET | Freq: Two times a day (BID) | ORAL | 11 refills | Status: DC

## 2023-11-21 MED ORDER — TORSEMIDE 20 MG PO TABS: 20.0000 mg | ORAL_TABLET | ORAL | 11 refills | Status: DC

## 2023-11-21 NOTE — Patient Instructions (Addendum)
 Thank you for coming in today  If you had labs drawn today, any labs that are abnormal the clinic will call you No news is good news  Medications: Decrease Amiodarone  to 100 mg 1/2 tablet daily   Follow up appointments:  Your physician recommends that you schedule a follow-up appointment in:  2 months With Dr. Rolan    Do the following things EVERYDAY: Weigh yourself in the morning before breakfast. Write it down and keep it in a log. Take your medicines as prescribed Eat low salt foods--Limit salt (sodium) to 2000 mg per day.  Stay as active as you can everyday Limit all fluids for the day to less than 2 liters   At the [facility], you and your health needs are our priority. As part of our continuing mission to provide you with exceptional heart care, we have created designated Provider Care Teams. These Care Teams include your primary Cardiologist (physician) and Advanced Practice Providers (APPs- Physician Assistants and Nurse Practitioners) who all work together to provide you with the care you need, when you need it.   You may see any of the following providers on your designated Care Team at your next follow up: Dr Toribio Fuel Dr Ezra Rolan Dr. Ria Gardenia Greig Lenetta, NP Caffie Shed, [place] [facility] [place], [place] Beckey Coe, NP Tinnie Redman, PharmD   Please be sure to bring in all your medications bottles to every appointment.    Thank you for choosing [facility]  If you have any questions or concerns before your next appointment please send us  a message through [place] or call our office at [phone].    TO LEAVE A MESSAGE FOR THE NURSE SELECT OPTION 2, PLEASE LEAVE A MESSAGE INCLUDING: YOUR NAME DATE OF BIRTH CALL BACK NUMBER REASON FOR CALL**this is important as we prioritize the call backs  YOU WILL RECEIVE A CALL BACK THE SAME DAY AS LONG AS YOU CALL BEFORE 4:00  PM

## 2023-11-22 ENCOUNTER — Ambulatory Visit: Payer: Self-pay | Admitting: Cardiology

## 2023-12-06 ENCOUNTER — Ambulatory Visit
Payer: Self-pay | Attending: Thoracic Surgery (Cardiothoracic Vascular Surgery) | Admitting: Thoracic Surgery (Cardiothoracic Vascular Surgery)

## 2023-12-06 ENCOUNTER — Other Ambulatory Visit: Payer: Self-pay

## 2023-12-06 VITALS — BP 131/82 | HR 75 | Resp 20 | Ht 65.0 in | Wt 223.0 lb

## 2023-12-06 DIAGNOSIS — Z952 Presence of prosthetic heart valve: Secondary | ICD-10-CM

## 2023-12-06 NOTE — Progress Notes (Signed)
 [address], [place] [address]             [phone]     HPI: [name] returns for scheduled follow-up after recent aortic valve replacement.  Jonathon Bray is a 58 year old man with a history of hypertension, congestive heart failure, critical aortic valve stenosis, bioprosthetic AVR, ascending aortic ectasia, non-ST elevation MI, type 2 diabetes, stage II chronic kidney disease, hyperlipidemia, and lung nodule.  He presented in March 2025 with congestive heart failure.  Echocardiogram showed progression of aortic stenosis and a low ejection fraction.  He was deemed too high risk at that time with uncontrolled diabetes and need for dental evaluation.  He did have cardiac catheterization which showed no hemodynamically significant coronary disease.  He presented back with a syncopal episode.  He underwent aortic valve replacement with a 23 mm Inspiris Resilia pericardial valve on 09/22/2023.  His postoperative course was complicated by left and right ventricular failure and acute kidney injury.  Ultimately was discharged home on 09/28/2023.  He was readmitted on 10/05/2023 after he developed blisters and serous drainage from his legs.  He was treated with IV diuresis and local wound care.  He was discharged home on 10/15/2023.  He is seen Jonathon Bray in the office twice since then.  Today he feels well.  He has not had any incisional pain since the surgery.  Swelling is improved dramatically.  Wounds have essentially healed at this point.  He does complain of some numbness in the 4th and 5th fingers and weakness in his left hand.  Says he noticed that for about a month but may be longer.  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    Nonrheumatic aortic valve stenosis 05/11/2022   Family history of premature CAD 05/11/2022   Atherosclerosis of aorta (HCC) 05/11/2022   Aortic dilatation (HCC) 05/11/2022   Hyperlipidemia associated with type 2 diabetes mellitus (HCC) 05/11/2022   Benign hypertension 05/11/2022   Non-ST elevation (NSTEMI) myocardial infarction (HCC) 05/10/2022   Hypercholesteremia 05/10/2022   Type 2 diabetes mellitus with complication, with long-term current use of insulin  (HCC) 05/10/2022   NSTEMI (non-ST elevated myocardial infarction) (HCC) 05/10/2022   Abnormal cardiac enzyme level 05/10/2022   Spondylolysis, lumbar region 01/01/2021   Solitary pulmonary nodule on lung CT 04/02/2020   Orthostatic dizziness 01/24/2014   Primary hypertension 01/24/2014   Diabetes mellitus (HCC) 06/01/2012    Current Outpatient Medications  Medication Sig Dispense Refill   acetaminophen  (TYLENOL ) 325 MG tablet Take 2 tablets (650 mg total) by mouth every 6 (six) hours as needed for mild pain (pain score 1-3).     amiodarone  (PACERONE ) 200 MG tablet Take 1 tablet (200 mg total) by mouth daily. 90 tablet 3   apixaban  (ELIQUIS ) 5 MG TABS tablet Take 1 tablet (5 mg total) by mouth 2 (two) times daily. 60 tablet 11   aspirin  81 MG chewable tablet Chew 1 tablet (81 mg total) by mouth daily.     atorvastatin  (LIPITOR) 40 MG tablet Take 1 tablet (40 mg total) by mouth at bedtime. 90 tablet 3   gabapentin  (NEURONTIN ) 100 MG capsule Take 1 capsule (100 mg total) by mouth 2 (two) times daily. If you feel you no  longer require this medication for pain please DO NOT stop abruptly. Please wean to 1 tablet daily for 1 week and then discontinue. 60 capsule 0   insulin  lispro (HUMALOG ) 100 UNIT/ML KwikPen Inject 16 Units into the skin 3 (three) times daily.     metoprolol  succinate (TOPROL  XL) 25 MG 24 hr tablet Take 1 tablet (25 mg total) by mouth daily. 30 tablet 11   sacubitril -valsartan  (ENTRESTO ) 24-26 MG Take 1 tablet by mouth  2 (two) times daily. 60 tablet 11   torsemide  (DEMADEX ) 20 MG tablet Take 1 tablet (20 mg total) by mouth every other day. 15 tablet 11   TRESIBA FLEXTOUCH 200 UNIT/ML FlexTouch Pen Inject 54 Units into the skin daily.     OZEMPIC, 0.25 OR 0.5 MG/DOSE, 2 MG/3ML SOPN Inject 0.25 mg into the skin once a week. (Patient not taking: Reported on 12/06/2023)     No current facility-administered medications for this visit.    Physical Exam BP 131/82   Pulse 75   Resp 20   Ht 5' 5 (1.651 m)   Wt 223 lb (101.2 kg)   SpO2 95% Comment: RA  BMI 37.98 kg/m  58 year old man in no acute distress Alert and oriented x 3 with no focal deficits Lungs diminished at bases but otherwise clear bilaterally Cardiac regular rate and rhythm with faint systolic murmur Sternum stable, incision well-healed Trace edema lower extremities bilaterally, Wounds healed Decreased sensation 4th and 5th fingers left hand, good range of motion, brisk capillary refill, mild decreased grip strength  Diagnostic Tests: None  Impression: Jonathon Bray is a 58 year old man with a history of hypertension, congestive heart failure, critical aortic valve stenosis, bioprosthetic AVR, ascending aortic ectasia, non-ST elevation MI, type 2 diabetes, stage II chronic kidney disease, hyperlipidemia, and lung nodule.  Status post aortic valve replacement-sternal incision healed and sternum is stable.  Had a chest x-ray at his last visit which showed no pleural effusions.  Acute on chronic left and right ventricular failure secondary to valvular cardiomyopathy-symptoms well-controlled on current regimen.  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  Will get physical therapy to work with him on that.  Plan: Continue medication regimen as prescribed by cardiology Physical therapy for strengthening left hand in the setting of probable brachial  plexopathy Tentatively plan to return to work on October 16 Return in 6 weeks for checkup prior to return to work  Jonathon JAYSON Millers, MD [facility] [phone]

## 2023-12-07 ENCOUNTER — Other Ambulatory Visit: Payer: Self-pay | Admitting: *Deleted

## 2023-12-07 DIAGNOSIS — G540 Brachial plexus disorders: Secondary | ICD-10-CM

## 2023-12-08 ENCOUNTER — Other Ambulatory Visit (HOSPITAL_BASED_OUTPATIENT_CLINIC_OR_DEPARTMENT_OTHER): Payer: Self-pay

## 2024-01-11 ENCOUNTER — Encounter (HOSPITAL_COMMUNITY): Payer: Self-pay | Admitting: Cardiology

## 2024-01-11 ENCOUNTER — Ambulatory Visit (HOSPITAL_COMMUNITY)
Admission: RE | Admit: 2024-01-11 | Discharge: 2024-01-11 | Disposition: A | Discharge status: Home / Self Care | Source: Ambulatory Visit | Attending: Cardiology | Admitting: Cardiology

## 2024-01-11 VITALS — BP 108/70 | HR 99 | Wt 223.6 lb

## 2024-01-11 DIAGNOSIS — M545 Low back pain, unspecified: Secondary | ICD-10-CM | POA: Insufficient documentation

## 2024-01-11 DIAGNOSIS — Z952 Presence of prosthetic heart valve: Secondary | ICD-10-CM | POA: Insufficient documentation

## 2024-01-11 DIAGNOSIS — I428 Other cardiomyopathies: Secondary | ICD-10-CM | POA: Insufficient documentation

## 2024-01-11 DIAGNOSIS — M79604 Pain in right leg: Secondary | ICD-10-CM | POA: Insufficient documentation

## 2024-01-11 DIAGNOSIS — M79605 Pain in left leg: Secondary | ICD-10-CM | POA: Insufficient documentation

## 2024-01-11 DIAGNOSIS — I447 Left bundle-branch block, unspecified: Secondary | ICD-10-CM | POA: Insufficient documentation

## 2024-01-11 DIAGNOSIS — I5022 Chronic systolic (congestive) heart failure: Secondary | ICD-10-CM | POA: Diagnosis not present

## 2024-01-11 DIAGNOSIS — Z7982 Long term (current) use of aspirin: Secondary | ICD-10-CM | POA: Diagnosis not present

## 2024-01-11 DIAGNOSIS — N183 Chronic kidney disease, stage 3 unspecified: Secondary | ICD-10-CM | POA: Insufficient documentation

## 2024-01-11 DIAGNOSIS — Z7901 Long term (current) use of anticoagulants: Secondary | ICD-10-CM | POA: Diagnosis not present

## 2024-01-11 DIAGNOSIS — E1122 Type 2 diabetes mellitus with diabetic chronic kidney disease: Secondary | ICD-10-CM | POA: Insufficient documentation

## 2024-01-11 DIAGNOSIS — I130 Hypertensive heart and chronic kidney disease with heart failure and stage 1 through stage 4 chronic kidney disease, or unspecified chronic kidney disease: Secondary | ICD-10-CM | POA: Insufficient documentation

## 2024-01-11 DIAGNOSIS — I4891 Unspecified atrial fibrillation: Secondary | ICD-10-CM | POA: Diagnosis present

## 2024-01-11 LAB — COMPREHENSIVE METABOLIC PANEL WITH GFR
ALT: 14 U/L (ref 0–44)
AST: 16 U/L (ref 15–41)
Albumin: 3.5 g/dL (ref 3.5–5.0)
Alkaline Phosphatase: 58 U/L (ref 38–126)
Anion gap: 12 (ref 5–15)
BUN: 39 mg/dL — ABNORMAL HIGH (ref 6–20)
CO2: 28 mmol/L (ref 22–32)
Calcium: 9.2 mg/dL (ref 8.9–10.3)
Chloride: 95 mmol/L — ABNORMAL LOW (ref 98–111)
Creatinine, Ser: 1.54 mg/dL — ABNORMAL HIGH (ref 0.61–1.24)
GFR, Estimated: 52 mL/min — ABNORMAL LOW (ref >60)
Glucose, Bld: 169 mg/dL — ABNORMAL HIGH (ref 70–99)
Potassium: 5.1 mmol/L (ref 3.5–5.1)
Sodium: 135 mmol/L (ref 135–145)
Total Bilirubin: 0.7 mg/dL (ref 0.0–1.2)
Total Protein: 7.2 g/dL (ref 6.5–8.1)

## 2024-01-11 LAB — TSH: TSH: 1.558 u[IU]/mL (ref 0.350–4.500)

## 2024-01-11 LAB — BRAIN NATRIURETIC PEPTIDE: B Natriuretic Peptide: 76 pg/mL (ref 0.0–100.0)

## 2024-01-11 MED ORDER — GABAPENTIN 100 MG PO CAPS: 100.0000 mg | ORAL_CAPSULE | Freq: Two times a day (BID) | ORAL | 0 refills | Status: DC

## 2024-01-11 MED ORDER — METOPROLOL SUCCINATE ER 25 MG PO TB24: 50.0000 mg | ORAL_TABLET | Freq: Every day | ORAL | 3 refills | Status: AC

## 2024-01-11 NOTE — Patient Instructions (Signed)
 STOP Amiodarone   INCREASE Toprol  XL to 50 mg daily.  Labs done today, your results will be available in MyChart, we will contact you for abnormal readings.  Your physician recommends that you schedule a follow-up appointment in: 6 weeks.  If you have any questions or concerns before your next appointment please send us  a message through [place] or call our office at [phone].    TO LEAVE A MESSAGE FOR THE NURSE SELECT OPTION 2, PLEASE LEAVE A MESSAGE INCLUDING: YOUR NAME DATE OF BIRTH CALL BACK NUMBER REASON FOR CALL**this is important as we prioritize the call backs  YOU WILL RECEIVE A CALL BACK THE SAME DAY AS LONG AS YOU CALL BEFORE 4:00 PM  At the [facility], you and your health needs are our priority. As part of our continuing mission to provide you with exceptional heart care, we have created designated Provider Care Teams. These Care Teams include your primary Cardiologist (physician) and Advanced Practice Providers (APPs- Physician Assistants and Nurse Practitioners) who all work together to provide you with the care you need, when you need it.   You may see any of the following providers on your designated Care Team at your next follow up: Dr Toribio Fuel Dr Ezra Shuck Dr. Ria Commander Dr. Morene Brownie Amy Lenetta, NP Caffie Shed, [place] [facility] [place], [place] Beckey Coe, NP [place] Lee, NP Ellouise Class, NP Tinnie Redman, PharmD Jaun Bash, PharmD   Please be sure to bring in all your medications bottles to every appointment.    Thank you for choosing [facility]

## 2024-01-12 ENCOUNTER — Ambulatory Visit (HOSPITAL_COMMUNITY): Payer: Self-pay | Admitting: Cardiology

## 2024-01-12 NOTE — Progress Notes (Signed)
 ADVANCED HF CLINIC NOTE   Primary Care: Rosamond Leta NOVAK, MD HF Cardiologist: Dr. Rolan  Chief complaint: CHF  HPI: 58 y.o. male with history of HFrEF, HTN, T2DM, CKD 3, and severe bicuspid aortic stenosis s/p bioprothestic AVR with Dr. Kerrin on 09/22/23. Echo post-op with EF 50% and nl RV. Pre-AVR echo in 6/25 showed EF 20-25%, RV normal, mild AI, severe AS LFLG, AoV mean gradient 21.0 mmHg.   After AVR, patient struggled with significant lower extremity edema. He was discharged home with unna boots and torsemide . Attempts were made to have home health nursing and PT see him, however they were unable to visit since he lives in [place] .  He was seen 10/05/23 for post-op follow up in [facility] with continued significant lower extremity volume + weeping and blisters. Admitted for IV diuresis, AHF consulted. Echo in 7/25 showed EF 45-50%, significant LV dyssynchrony, LV with global HK, RV mildly reduced, LA mildly dilated, trivial MR, normal structure and function of AV prosthesis. He was diuresed with IV lasix , and GDMT titrated. Received IV cefazolin  for venous stasis ulcers with possible infection. Hospitalization complicated by atrial fibrillation with RVR, loaded with amiodarone . He was discharged home, weight 197 lbs.  Post-op echo 11/08/23 showed EF 50-55%, RV mildly reduced systolic function, stable AoV prosthesis with mean gradient 7 mmHg.   He returns today for followup of CHF with his wife.  He is doing cardiac rehab in [place].  Main complaint is low back pain and leg pain which he attributes to being out of gabapentin  for a couple of weeks.  He has a history of prior back surgery.  He also has neuropathic-type pain in his feet.  He uses a cane due to back pain.  He has not had significant dyspnea walking around or going to cardiac rehab.  No chest pain other than surgical site soreness.  No orthopnea/PND.  No lightheadedness. 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   ECG (personally reviewed): NSR, LBBB 154 msec  Labs (8/25): K 5.3, creatinine 1.42, BNP 115, TSH normal, LFTs normal  PMH: 1. Aortic stenosis: Bicuspid aortic valve with severe AS.  S/p bioprosthetic AVR 6/25. AoV prosthesis functioning normally on post-op echoes.  2. Chronic systolic CHF: Nonischemic cardiomyopathy.  EF decreased in setting of severe AS. Suspect this was primarily as valvular cardiomyopathy as EF was improved post-op.  He also has LBBB.  - Echo (2/25): EF 35-40%, RV normal, severe AS with mean gradient 47 mmhg - R/LHC (3/25): Normal coronaries; RA 19, PA 70/38 (52), PCWP 33 with V waves to 45 mmHg, PVR 4.5 WU, PAPi 1.7, CO/CI (Fick) 4.2/2.1 - Echo (6/25): EF 20-25%, RV normal, mild AI, severe AS LFLG, AoV mean gradient 21.0 mmHg - Echo (7/25): EF 45-50%, significant LV dyssynchrony, LV with GHK, RV mildly reduced, LA mildly dilated, trivial MR, normal structure and function of AV prosthesis. - Echo (11/08/23):  EF 50-55%, RV mildly reduced,  stable AoV prosthesis with mean gradient 7 mmHg.  3. CKD stage 3 4. H/o back surgery  5. Type 2 diabetes 6. HTN  SH: Married with 3 kids, nonsmoker, lives in [place].  Works with heavy equipment.   Current Outpatient Medications  Medication Sig Dispense Refill   acetaminophen  (TYLENOL ) 325 MG tablet Take 2 tablets (650 mg total) by mouth every 6 (six) hours as needed for mild pain (pain score 1-3).     apixaban  (ELIQUIS ) 5 MG TABS tablet Take  1 tablet (5 mg total) by mouth 2 (two) times daily. 60 tablet 11   aspirin  81 MG chewable tablet Chew 1 tablet (81 mg total) by mouth daily.     atorvastatin  (LIPITOR) 40 MG tablet Take 1 tablet (40 mg total) by mouth at bedtime. 90 tablet 3   insulin  lispro (HUMALOG ) 100 UNIT/ML KwikPen Inject 16 Units into the skin 3 (three) times daily.     sacubitril -valsartan  (ENTRESTO ) 24-26 MG Take 1 tablet by mouth 2 (two) times daily. 60 tablet 11   torsemide  (DEMADEX ) 20 MG  tablet Take 1 tablet (20 mg total) by mouth every other day. 15 tablet 11   TRESIBA FLEXTOUCH 200 UNIT/ML FlexTouch Pen Inject 54 Units into the skin daily.     gabapentin  (NEURONTIN ) 100 MG capsule Take 1 capsule (100 mg total) by mouth 2 (two) times daily. If you feel you no longer require this medication for pain please DO NOT stop abruptly. Please wean to 1 tablet daily for 1 week and then discontinue. 60 capsule 0   metoprolol  succinate (TOPROL  XL) 25 MG 24 hr tablet Take 2 tablets (50 mg total) by mouth daily. 180 tablet 3   OZEMPIC, 0.25 OR 0.5 MG/DOSE, 2 MG/3ML SOPN Inject 0.25 mg into the skin once a week. (Patient not taking: Reported on 01/11/2024)     No current facility-administered medications for this encounter.   Allergies  Allergen Reactions   Invokana [Canagliflozin] Other (See Comments)    Dizziness Syncope    Levaquin [Levofloxacin] Rash   Zestril  [Lisinopril ] Cough   Farxiga  [Dapagliflozin ] Other (See Comments)    dizziness   Family History  Problem Relation Age of Onset   Diabetes Mother    Diabetes Father    Heart disease Father    Heart attack Sister    Hypertension Sister    Heart attack Brother    Other Brother 68       Shot in the head   Other Brother 5       Polio   Diabetes Brother    Wt Readings from Last 3 Encounters:  01/11/24 101.4 kg (223 lb 9.6 oz)  12/06/23 101.2 kg (223 lb)  11/21/23 97.8 kg (215 lb 9.6 oz)   BP 108/70   Pulse 99   Wt 101.4 kg (223 lb 9.6 oz)   SpO2 96%   BMI 37.21 kg/m   PHYSICAL EXAM: General: NAD Neck: No JVD, no thyromegaly or thyroid  nodule.  Lungs: Clear to auscultation bilaterally with normal respiratory effort. CV: Nondisplaced PMI.  Heart regular S1/S2, no S3/S4, 1/6 SEM RUSB.  Trace ankle edema.  No carotid bruit.  Normal pedal pulses.  Abdomen: Soft, nontender, no hepatosplenomegaly, no distention.  Skin: Intact without lesions or rashes.  Neurologic: Alert and oriented x 3.  Psych: Normal  affect. Extremities: No clubbing or cyanosis. Ulcers on lower legs have healed.  HEENT: Normal.   ASSESSMENT & PLAN: 1. Chronic systolic CHF: Nonischemic cardiomyopathy.  Suspect valvular related to severe AS, LBBB also contributes. Cath 3/25 pre-AVR showed no significant CAD but markedly elevated filling pressures.  Echo in 6/25 (pre-op) showed EF 20-25% with septal-lateral dyssynchrony, normal RV function, severe aortic stenosis.  Patient is now s/p bioprosthetic AVR.  7/25 post-op echo showed EF 45-50%, significant LV dyssynchrony, LV with GHK, RV mildly reduced systolic function, LA mildly dilated, trivial MR, normal structure and function of AV prosthesis. Echo in 8/25 showed  EF 50-55%, RV mildly reduced,  stable AoV prosthesis with  mean gradient 7 mmHg. NYHA II, more limited by pain than breathing.  He is not volume overloaded on exam today. - Off spironolactone  with hyperkalemia. - Continue torsemide  20 mg every other day. BMET and BNP today. - Increase Toprol  XL to 50 mg daily after stopping amiodarone .  - Continue Entresto  24-26 mg bid - Allergy to Farxiga  and Invokana (syncope/dizziness).  2. Aortic stenosis: Bicuspid aortic valve.  S/p bioprosthetic AVR in 6/25. Bioprosthetic valve appeared normal on 8/25 echo.  - Will need eventual screening of thoracic aorta for aneurysm given bicuspid AoV.  3. CKD stage 3: Creatinine baseline ~ 1.5-1.7.  - BMET today.  4. Venous stasis ulcers: Resolved.  5. Atrial fibrillation: Had AF with RVR during admission in 7/25. No further AF or palpitations noted on amiodarone .  - Stop amiodarone  today and follow for AF recurrence.  Check LFTs and TSH today.  - I will increase Toprol  XL to 50 mg daily with stopping amiodarone .  - Continue Eliquis  5 mg bid.    Follow up in 6 wks with APP.    I spent 32 minutes reviewing records and echo, interviewing/examining patient, and managing orders.   Ezra Shuck 01/12/24

## 2024-01-16 ENCOUNTER — Other Ambulatory Visit: Payer: Self-pay | Admitting: Cardiology

## 2024-01-16 DIAGNOSIS — R7989 Other specified abnormal findings of blood chemistry: Secondary | ICD-10-CM

## 2024-01-17 ENCOUNTER — Ambulatory Visit: Attending: Thoracic Surgery (Cardiothoracic Vascular Surgery)

## 2024-01-17 VITALS — BP 117/81 | HR 88 | Resp 18 | Ht 65.0 in | Wt 230.0 lb

## 2024-01-17 DIAGNOSIS — I350 Nonrheumatic aortic (valve) stenosis: Secondary | ICD-10-CM

## 2024-01-17 DIAGNOSIS — Z953 Presence of xenogenic heart valve: Secondary | ICD-10-CM | POA: Diagnosis not present

## 2024-01-17 NOTE — Patient Instructions (Signed)
Follow up with our clinic as needed.

## 2024-01-17 NOTE — Progress Notes (Signed)
 [address] [place] [id number]             [phone]       HPI: [name] is a 58 year old male with medical history of HTN, NSTEMI, heart failure, type 2 DM, stage II chronic kidney disease, and hyperlipidemia who presents to the office for post operative follow up.  Patient had a bioprosthetic aortic valve replacement on 09/22/2023 by Dr. Kerrin with a 23 mm Edwards Inspiris Resilia pericardial tissue valve.  His postoperative course was complicated by left and right ventricular failure and acute kidney injury.  Ultimately was discharged home on 09/28/2023.  He was readmitted on 10/05/2023 after he developed blisters and serous drainage from his legs.  He was treated with IV diuresis and local wound care.  He was discharged home on 10/15/2023.     He presents to the clinic for continued postsurgical follow up. He has been participating in cardiac rehab.  He does have pain in his feet that limits his abilities to walk and stand for long periods. He states he has been evaluated by his PCP for this and they found nothing wrong.   He still has some numbness in his 4th and 5th finger in his left hand.  He is apprehensive about going back to work since he does not think his job will be accommodating after his surgery.      Current Outpatient Medications  Medication Sig Dispense Refill   acetaminophen  (TYLENOL ) 325 MG tablet Take 2 tablets (650 mg total) by mouth every 6 (six) hours as needed for mild pain (pain score 1-3).     apixaban  (ELIQUIS ) 5 MG TABS tablet Take 1 tablet (5 mg total) by mouth 2 (two) times daily. 60 tablet 11   aspirin  81 MG chewable tablet Chew 1 tablet (81 mg total) by mouth daily.     atorvastatin  (LIPITOR) 40 MG tablet Take 1 tablet (40 mg total) by mouth at bedtime. 90 tablet 3   gabapentin  (NEURONTIN ) 100 MG capsule Take 1 capsule (100 mg total) by mouth 2 (two) times daily. If you feel you no longer require this medication for  pain please DO NOT stop abruptly. Please wean to 1 tablet daily for 1 week and then discontinue. 60 capsule 0   insulin  lispro (HUMALOG ) 100 UNIT/ML KwikPen Inject 16 Units into the skin 3 (three) times daily.     metoprolol  succinate (TOPROL  XL) 25 MG 24 hr tablet Take 2 tablets (50 mg total) by mouth daily. 180 tablet 3   OZEMPIC, 0.25 OR 0.5 MG/DOSE, 2 MG/3ML SOPN Inject 0.25 mg into the skin once a week.     sacubitril -valsartan  (ENTRESTO ) 24-26 MG Take 1 tablet by mouth 2 (two) times daily. 60 tablet 11   torsemide  (DEMADEX ) 20 MG tablet Take 1 tablet (20 mg total) by mouth every other day. 15 tablet 11   TRESIBA FLEXTOUCH 200 UNIT/ML FlexTouch Pen Inject 54 Units into the skin daily.     No current facility-administered medications for this visit.    Vitals:   01/17/24 1513  BP: 117/81  Pulse: 88  Resp: 18  SpO2: 94%   Review of Systems  Respiratory: Negative.  Negative for cough and shortness of breath.   Cardiovascular: Negative.  Negative for chest pain, palpitations and leg swelling.  Neurological:  Positive for sensory change. Negative for dizziness.       Numbness In 4th  and 5th fingers left hand   All other systems reviewed and are negative.   Physical Exam: Physical Exam Constitutional:      Appearance: Normal appearance.  HENT:     Head: Normocephalic and atraumatic.  Cardiovascular:     Rate and Rhythm: Normal rate and regular rhythm.     Heart sounds: S1 normal and S2 normal. Murmur heard.     Comments: Soft systolic murmur consistent with the bioprosthetic aortic valve Skin:    General: Skin is warm and dry.  Neurological:     General: No focal deficit present.     Mental Status: He is alert and oriented to person, place, and time.      Diagnostic Tests: N/A   Plan: S/P AVR (aortic valve replacement) -We discussed continuance of cardiac rehab and exercise since this will help with his endurance.  -Provided return to work note for 01/23/2024.  Gave  restrictions that he should be allowed breaks and slowly return to full lifting -He is to continue follow up with heart failure clinic and his PCP -Numbness in left hand should improve with time.  Continue to do hand strengthening exercises -Follow up with [facility] as needed    Jonathon CHRISTELLA Rough, PA-C [facility] [phone]

## 2024-01-20 ENCOUNTER — Telehealth: Payer: Self-pay | Admitting: Cardiology

## 2024-01-20 NOTE — Telephone Encounter (Signed)
 Left message for patient to call back

## 2024-01-20 NOTE — Telephone Encounter (Signed)
 Pt is requesting a callback wanting to know when he was first diagnosed by MD. Please advise

## 2024-01-24 NOTE — Telephone Encounter (Signed)
Unable to reach pt or leave a message mailbox is full 

## 2024-01-25 ENCOUNTER — Encounter: Payer: Self-pay | Admitting: *Deleted

## 2024-01-27 NOTE — Telephone Encounter (Signed)
 Have not been able to contact patient.  Will close this encounter and awit a call back with any further questions or concerns.

## 2024-02-20 ENCOUNTER — Encounter (HOSPITAL_COMMUNITY): Payer: Self-pay

## 2024-02-20 ENCOUNTER — Ambulatory Visit (HOSPITAL_COMMUNITY)
Admission: RE | Admit: 2024-02-20 | Discharge: 2024-02-20 | Disposition: A | Source: Ambulatory Visit | Attending: Adult Health | Admitting: Adult Health

## 2024-02-20 VITALS — BP 136/84 | HR 87 | Ht 65.0 in | Wt 233.6 lb

## 2024-02-20 DIAGNOSIS — N183 Chronic kidney disease, stage 3 unspecified: Secondary | ICD-10-CM | POA: Insufficient documentation

## 2024-02-20 DIAGNOSIS — Z7901 Long term (current) use of anticoagulants: Secondary | ICD-10-CM | POA: Diagnosis not present

## 2024-02-20 DIAGNOSIS — E669 Obesity, unspecified: Secondary | ICD-10-CM | POA: Insufficient documentation

## 2024-02-20 DIAGNOSIS — Z888 Allergy status to other drugs, medicaments and biological substances status: Secondary | ICD-10-CM | POA: Diagnosis not present

## 2024-02-20 DIAGNOSIS — I4891 Unspecified atrial fibrillation: Secondary | ICD-10-CM | POA: Insufficient documentation

## 2024-02-20 DIAGNOSIS — E1122 Type 2 diabetes mellitus with diabetic chronic kidney disease: Secondary | ICD-10-CM | POA: Insufficient documentation

## 2024-02-20 DIAGNOSIS — I350 Nonrheumatic aortic (valve) stenosis: Secondary | ICD-10-CM | POA: Diagnosis not present

## 2024-02-20 DIAGNOSIS — Z8249 Family history of ischemic heart disease and other diseases of the circulatory system: Secondary | ICD-10-CM | POA: Insufficient documentation

## 2024-02-20 DIAGNOSIS — Z953 Presence of xenogenic heart valve: Secondary | ICD-10-CM | POA: Insufficient documentation

## 2024-02-20 DIAGNOSIS — Z79899 Other long term (current) drug therapy: Secondary | ICD-10-CM | POA: Insufficient documentation

## 2024-02-20 DIAGNOSIS — Z6838 Body mass index (BMI) 38.0-38.9, adult: Secondary | ICD-10-CM | POA: Insufficient documentation

## 2024-02-20 DIAGNOSIS — I480 Paroxysmal atrial fibrillation: Secondary | ICD-10-CM

## 2024-02-20 DIAGNOSIS — I130 Hypertensive heart and chronic kidney disease with heart failure and stage 1 through stage 4 chronic kidney disease, or unspecified chronic kidney disease: Secondary | ICD-10-CM | POA: Insufficient documentation

## 2024-02-20 DIAGNOSIS — I428 Other cardiomyopathies: Secondary | ICD-10-CM | POA: Diagnosis not present

## 2024-02-20 DIAGNOSIS — N529 Male erectile dysfunction, unspecified: Secondary | ICD-10-CM | POA: Insufficient documentation

## 2024-02-20 DIAGNOSIS — Z794 Long term (current) use of insulin: Secondary | ICD-10-CM | POA: Diagnosis not present

## 2024-02-20 DIAGNOSIS — Z833 Family history of diabetes mellitus: Secondary | ICD-10-CM | POA: Insufficient documentation

## 2024-02-20 DIAGNOSIS — I5022 Chronic systolic (congestive) heart failure: Secondary | ICD-10-CM | POA: Insufficient documentation

## 2024-02-20 DIAGNOSIS — Q2381 Bicuspid aortic valve: Secondary | ICD-10-CM | POA: Insufficient documentation

## 2024-02-20 MED ORDER — SILDENAFIL CITRATE 50 MG PO TABS: 50.0000 mg | ORAL_TABLET | Freq: Every day | ORAL | 1 refills | Status: AC | PRN

## 2024-02-20 NOTE — Patient Instructions (Signed)
 Medication Changes:  START SILDENAFIL(VIAGRA) 50MG  DAILY AS NEEDED   Follow-Up in: 6 weeks as scheduled   At the [facility], you and your health needs are our priority. We have a designated team specialized in the treatment of Heart Failure. This Care Team includes your primary Heart Failure Specialized Cardiologist (physician), Advanced Practice Providers (APPs- Physician Assistants and Nurse Practitioners), and Pharmacist who all work together to provide you with the care you need, when you need it.   You may see any of the following providers on your designated Care Team at your next follow up:  Dr. Toribio Fuel Dr. Ezra Shuck Dr. Odis Brownie Greig Mosses, NP Caffie Shed, [place] [facility] [place], [place] Beckey Coe, NP Jordan Lee, NP Tinnie Redman, PharmD   Please be sure to bring in all your medications bottles to every appointment.   Need to Contact Us :  If you have any questions or concerns before your next appointment please send us  a message through [place] or call our office at [phone].    TO LEAVE A MESSAGE FOR THE NURSE SELECT OPTION 2, PLEASE LEAVE A MESSAGE INCLUDING: YOUR NAME DATE OF BIRTH CALL BACK NUMBER REASON FOR CALL**this is important as we prioritize the call backs  YOU WILL RECEIVE A CALL BACK THE SAME DAY AS LONG AS YOU CALL BEFORE 4:00 PM

## 2024-02-20 NOTE — Progress Notes (Signed)
 ADVANCED HF CLINIC NOTE   Primary Care: Rosamond Leta NOVAK, MD HF Cardiologist: Dr. Rolan  Chief complaint: Heart Failure   HPI: 58 y.o. male with history of HFrEF, HTN, T2DM, CKD 3, and severe bicuspid aortic stenosis s/p bioprothestic AVR with Dr. Kerrin on 09/22/23. Echo post-op with EF 50% and nl RV. Pre-AVR echo in 6/25 showed EF 20-25%, RV normal, mild AI, severe AS LFLG, AoV mean gradient 21.0 mmHg.   After AVR, patient struggled with significant lower extremity edema. He was discharged home with unna boots and torsemide . Attempts were made to have home health nursing and PT see him, however they were unable to visit since he lives in [place] .  He was seen 10/05/23 for post-op follow up in [facility] with continued significant lower extremity volume + weeping and blisters. Admitted for IV diuresis, AHF consulted. Echo in 7/25 showed EF 45-50%, significant LV dyssynchrony, LV with global HK, RV mildly reduced, LA mildly dilated, trivial MR, normal structure and function of AV prosthesis. He was diuresed with IV lasix , and GDMT titrated. Received IV cefazolin  for venous stasis ulcers with possible infection. Hospitalization complicated by atrial fibrillation with RVR, loaded with amiodarone . He was discharged home, weight 197 lbs.  Post-op echo 11/08/23 showed EF 50-55%, RV mildly reduced systolic function, stable AoV prosthesis with mean gradient 7 mmHg.   Today he returns for HF follow up. Complaining of erectile dysfunction.  He does get short of breath bending over. Working full time again 5 days a week. Occasionally short of breath walking. Denies PND/Orthopnea. Appetite ok. No fever or chills. He has not been weighing at home. He has cut back on sodas and potatoes. Taking all medications. Completed cardiac rehab. He has started going to the gym. He can not afford ozempic.   Labs (8/25): K 5.3, creatinine 1.42, BNP 115, TSH normal, LFTs normal  PMH: 1. Aortic stenosis: Bicuspid aortic  valve with severe AS.  S/p bioprosthetic AVR 6/25. AoV prosthesis functioning normally on post-op echoes.  2. Chronic systolic CHF: Nonischemic cardiomyopathy.  EF decreased in setting of severe AS. Suspect this was primarily as valvular cardiomyopathy as EF was improved post-op.  He also has LBBB.  - Echo (2/25): EF 35-40%, RV normal, severe AS with mean gradient 47 mmhg - R/LHC (3/25): Normal coronaries; RA 19, PA 70/38 (52), PCWP 33 with V waves to 45 mmHg, PVR 4.5 WU, PAPi 1.7, CO/CI (Fick) 4.2/2.1 - Echo (6/25): EF 20-25%, RV normal, mild AI, severe AS LFLG, AoV mean gradient 21.0 mmHg - Echo (7/25): EF 45-50%, significant LV dyssynchrony, LV with GHK, RV mildly reduced, LA mildly dilated, trivial MR, normal structure and function of AV prosthesis. - Echo (11/08/23):  EF 50-55%, RV mildly reduced,  stable AoV prosthesis with mean gradient 7 mmHg.  3. CKD stage 3 4. H/o back surgery  5. Type 2 diabetes 6. HTN  SH: Married with 3 kids, nonsmoker, lives in [place].  Works with heavy equipment.   Current Outpatient Medications  Medication Sig Dispense Refill   acetaminophen  (TYLENOL ) 325 MG tablet Take 2 tablets (650 mg total) by mouth every 6 (six) hours as needed for mild pain (pain score 1-3).     apixaban  (ELIQUIS ) 5 MG TABS tablet Take 1 tablet (5 mg total) by mouth 2 (two) times daily. 60 tablet 11   aspirin  81 MG chewable tablet Chew 1 tablet (81 mg total) by mouth daily.     atorvastatin  (LIPITOR) 40 MG tablet Take 1 tablet (  40 mg total) by mouth at bedtime. 90 tablet 3   gabapentin  (NEURONTIN ) 100 MG capsule Take 1 capsule (100 mg total) by mouth 2 (two) times daily. If you feel you no longer require this medication for pain please DO NOT stop abruptly. Please wean to 1 tablet daily for 1 week and then discontinue. 60 capsule 0   insulin  lispro (HUMALOG ) 100 UNIT/ML KwikPen Inject 16 Units into the skin 3 (three) times daily.     metoprolol  succinate (TOPROL  XL) 25 MG 24 hr tablet  Take 2 tablets (50 mg total) by mouth daily. 180 tablet 3   sacubitril -valsartan  (ENTRESTO ) 24-26 MG Take 1 tablet by mouth 2 (two) times daily. 60 tablet 11   torsemide  (DEMADEX ) 20 MG tablet Take 1 tablet (20 mg total) by mouth every other day. 45 tablet 2   TRESIBA FLEXTOUCH 200 UNIT/ML FlexTouch Pen Inject 54 Units into the skin daily.     No current facility-administered medications for this encounter.   Allergies  Allergen Reactions   Invokana [Canagliflozin] Other (See Comments)    Dizziness Syncope    Levaquin [Levofloxacin] Rash   Zestril  [Lisinopril ] Cough   Farxiga  [Dapagliflozin ] Other (See Comments)    dizziness   Family History  Problem Relation Age of Onset   Diabetes Mother    Diabetes Father    Heart disease Father    Heart attack Sister    Hypertension Sister    Heart attack Brother    Other Brother 65       Shot in the head   Other Brother 5       Polio   Diabetes Brother    Wt Readings from Last 3 Encounters:  02/20/24 106 kg (233 lb 9.6 oz)  01/17/24 104.3 kg (230 lb)  01/11/24 101.4 kg (223 lb 9.6 oz)   BP 136/84   Pulse 87   Ht 5' 5 (1.651 m)   Wt 106 kg (233 lb 9.6 oz)   SpO2 95%   BMI 38.87 kg/m   PHYSICAL EXAM: General:   No resp difficulty Neck: no JVD.  Cor: Regular rate & rhythm.  Lungs: clear Abdomen: soft, nontender, nondistended.  Extremities: R and LLE traced edema.  Neuro: alert & oriented x3  ASSESSMENT & PLAN: 1. Chronic systolic CHF: Nonischemic cardiomyopathy.  Suspect valvular related to severe AS, LBBB also contributes. Cath 3/25 pre-AVR showed no significant CAD but markedly elevated filling pressures.  Echo in 6/25 (pre-op) showed EF 20-25% with septal-lateral dyssynchrony, normal RV function, severe aortic stenosis.  Patient is now s/p bioprosthetic AVR.  7/25 post-op echo showed EF 45-50%, significant LV dyssynchrony, LV with GHK, RV mildly reduced systolic function, LA mildly dilated, trivial MR, normal structure and  function of AV prosthesis. Echo in 8/25 showed  EF 50-55%, RV mildly reduced,  stable AoV prosthesis with mean gradient 7 mmHg.  Completed cardiac rehab.  NYHA II. Appears euvolemic.  - Off spironolactone  with hyperkalemia. - Continue torsemide  20 mg every other day.  - Continue Toprol  XL to 50 mg daily for now. He is concerned about ED.  - Continue Entresto  24-26 mg bid - Allergy to Farxiga  and Invokana (syncope/dizziness).  2. Aortic stenosis: Bicuspid aortic valve.  S/p bioprosthetic AVR in 6/25. Bioprosthetic valve appeared normal on 8/25 echo.  - Will need eventual screening of thoracic aorta for aneurysm given bicuspid AoV.  3. CKD stage 3: Creatinine baseline ~ 1.5-1.7.   Reviewed BMET from 01/11/24, stable.  4. Venous stasis ulcers:  Resolved.  5. Atrial fibrillation: Had AF with RVR during admission in 7/25. No further AF or palpitations noted on amiodarone .  Regular on exam.  - Off amio.  - Continue Toprol  XL to 50 mg daily .  - Continue Eliquis  5 mg bid.  6.Erectile dysfunction.  Suspect related to bb.  Will try sildenafil 50 mg as needed. Instructed to stop taking if gets dizzy. Provided with coupon for sildenafil.  May need referral to Urology.   7. Obesity  Body mass index is 38.87 kg/m. Unable to afford ozempic.  Discussed portion control.   Follow up 6 weeks with APP or Dr Rolan.     Mallarie Voorhies NP-C  02/20/24

## 2024-02-22 ENCOUNTER — Encounter (HOSPITAL_COMMUNITY)

## 2024-03-16 ENCOUNTER — Encounter: Payer: Self-pay | Admitting: Cardiology

## 2024-04-03 ENCOUNTER — Ambulatory Visit (HOSPITAL_COMMUNITY)
Admission: RE | Admit: 2024-04-03 | Discharge: 2024-04-03 | Disposition: A | Discharge status: Home / Self Care | Source: Ambulatory Visit | Attending: Family Medicine | Admitting: Family Medicine

## 2024-04-03 ENCOUNTER — Other Ambulatory Visit (HOSPITAL_COMMUNITY): Payer: Self-pay

## 2024-04-03 ENCOUNTER — Telehealth (HOSPITAL_COMMUNITY): Payer: Self-pay

## 2024-04-03 ENCOUNTER — Encounter (HOSPITAL_COMMUNITY): Payer: Self-pay

## 2024-04-03 VITALS — BP 150/90 | HR 85 | Wt 245.4 lb

## 2024-04-03 DIAGNOSIS — E1122 Type 2 diabetes mellitus with diabetic chronic kidney disease: Secondary | ICD-10-CM | POA: Diagnosis not present

## 2024-04-03 DIAGNOSIS — Z6841 Body Mass Index (BMI) 40.0 and over, adult: Secondary | ICD-10-CM | POA: Diagnosis not present

## 2024-04-03 DIAGNOSIS — I130 Hypertensive heart and chronic kidney disease with heart failure and stage 1 through stage 4 chronic kidney disease, or unspecified chronic kidney disease: Secondary | ICD-10-CM | POA: Diagnosis present

## 2024-04-03 DIAGNOSIS — I447 Left bundle-branch block, unspecified: Secondary | ICD-10-CM | POA: Diagnosis not present

## 2024-04-03 DIAGNOSIS — I878 Other specified disorders of veins: Secondary | ICD-10-CM

## 2024-04-03 DIAGNOSIS — I5022 Chronic systolic (congestive) heart failure: Secondary | ICD-10-CM | POA: Insufficient documentation

## 2024-04-03 DIAGNOSIS — I350 Nonrheumatic aortic (valve) stenosis: Secondary | ICD-10-CM | POA: Diagnosis not present

## 2024-04-03 DIAGNOSIS — Z7901 Long term (current) use of anticoagulants: Secondary | ICD-10-CM | POA: Insufficient documentation

## 2024-04-03 DIAGNOSIS — Z7982 Long term (current) use of aspirin: Secondary | ICD-10-CM | POA: Diagnosis not present

## 2024-04-03 DIAGNOSIS — I428 Other cardiomyopathies: Secondary | ICD-10-CM | POA: Insufficient documentation

## 2024-04-03 DIAGNOSIS — R5383 Other fatigue: Secondary | ICD-10-CM | POA: Diagnosis not present

## 2024-04-03 DIAGNOSIS — Z952 Presence of prosthetic heart valve: Secondary | ICD-10-CM | POA: Insufficient documentation

## 2024-04-03 DIAGNOSIS — I480 Paroxysmal atrial fibrillation: Secondary | ICD-10-CM

## 2024-04-03 DIAGNOSIS — Z79899 Other long term (current) drug therapy: Secondary | ICD-10-CM | POA: Diagnosis not present

## 2024-04-03 DIAGNOSIS — N183 Chronic kidney disease, stage 3 unspecified: Secondary | ICD-10-CM | POA: Insufficient documentation

## 2024-04-03 DIAGNOSIS — Z794 Long term (current) use of insulin: Secondary | ICD-10-CM | POA: Insufficient documentation

## 2024-04-03 DIAGNOSIS — E669 Obesity, unspecified: Secondary | ICD-10-CM | POA: Diagnosis not present

## 2024-04-03 LAB — IRON AND TIBC
Iron: 60 ug/dL (ref 45–182)
Saturation Ratios: 21 % (ref 17.9–39.5)
TIBC: 281 ug/dL (ref 250–450)
UIBC: 221 ug/dL

## 2024-04-03 LAB — BASIC METABOLIC PANEL WITH GFR
Anion gap: 7 (ref 5–15)
BUN: 52 mg/dL — ABNORMAL HIGH (ref 6–20)
CO2: 29 mmol/L (ref 22–32)
Calcium: 8.8 mg/dL — ABNORMAL LOW (ref 8.9–10.3)
Chloride: 100 mmol/L (ref 98–111)
Creatinine, Ser: 1.34 mg/dL — ABNORMAL HIGH (ref 0.61–1.24)
GFR, Estimated: >60 mL/min
Glucose, Bld: 162 mg/dL — ABNORMAL HIGH (ref 70–99)
Potassium: 5.8 mmol/L — ABNORMAL HIGH (ref 3.5–5.1)
Sodium: 136 mmol/L (ref 135–145)

## 2024-04-03 LAB — FERRITIN: Ferritin: 186 ng/mL (ref 24–336)

## 2024-04-03 LAB — CBC
HCT: 42.8 % (ref 39.0–52.0)
Hemoglobin: 14.9 g/dL (ref 13.0–17.0)
MCH: 29 pg (ref 26.0–34.0)
MCHC: 34.8 g/dL (ref 30.0–36.0)
MCV: 83.4 fL (ref 80.0–100.0)
Platelets: 274 K/uL (ref 150–400)
RBC: 5.13 MIL/uL (ref 4.22–5.81)
RDW: 12.3 % (ref 11.5–15.5)
WBC: 11.5 K/uL — ABNORMAL HIGH (ref 4.0–10.5)
nRBC: 0 % (ref 0.0–0.2)

## 2024-04-03 LAB — PRO BRAIN NATRIURETIC PEPTIDE: Pro Brain Natriuretic Peptide: 188 pg/mL

## 2024-04-03 MED ORDER — TORSEMIDE 20 MG PO TABS: 20.0000 mg | ORAL_TABLET | Freq: Every day | ORAL | 2 refills | Status: DC

## 2024-04-03 MED ORDER — SACUBITRIL-VALSARTAN 49-51 MG PO TABS: 1.0000 | ORAL_TABLET | Freq: Two times a day (BID) | ORAL | 11 refills | Status: DC

## 2024-04-03 NOTE — Patient Instructions (Signed)
 CHANGE Entresto  to 49/51 mg Twice daily  CHANGE Torsemide  to 20 mg daily.  Labs done today, your results will be available in MyChart, we will contact you for abnormal readings.  REPEAT blood work in 2 weeks.  Your physician recommends that you schedule a follow-up appointment in: as scheduled.   If you have any questions or concerns before your next appointment please send us  a message through [place] or call our office at [phone].    TO LEAVE A MESSAGE FOR THE NURSE SELECT OPTION 2, PLEASE LEAVE A MESSAGE INCLUDING: YOUR NAME DATE OF BIRTH CALL BACK NUMBER REASON FOR CALL**this is important as we prioritize the call backs  YOU WILL RECEIVE A CALL BACK THE SAME DAY AS LONG AS YOU CALL BEFORE 4:00 PM  At the [facility], you and your health needs are our priority. As part of our continuing mission to provide you with exceptional heart care, we have created designated Provider Care Teams. These Care Teams include your primary Cardiologist (physician) and Advanced Practice Providers (APPs- Physician Assistants and Nurse Practitioners) who all work together to provide you with the care you need, when you need it.   You may see any of the following providers on your designated Care Team at your next follow up: Dr Toribio Fuel Dr Ezra Shuck Dr. Morene Brownie Greig Mosses, NP Caffie Shed, [place] [facility] [place], [place] Beckey Coe, NP Jordan Lee, NP Ellouise Class, NP Tinnie Redman, PharmD Jaun Bash, PharmD   Please be sure to bring in all your medications bottles to every appointment.    Thank you for choosing [facility]

## 2024-04-03 NOTE — Telephone Encounter (Signed)
 Called to confirm/remind patient of their appointment at the [facility] on 04/03/24.   Appointment:   [] Confirmed  [x] Left mess   [] No answer/No voice mail  [] VM Full/unable to leave message  [] Phone not in service  And to bring in all medications and/or complete list.

## 2024-04-03 NOTE — Progress Notes (Signed)
 "  ADVANCED HF CLINIC NOTE   Primary Care: Rosamond Leta NOVAK, MD HF Cardiologist: Dr. Rolan  HPI: 58 y.o. male with history of HFrEF, HTN, T2DM, CKD 3, and severe bicuspid aortic stenosis s/p bioprothestic AVR with Dr. Kerrin on 09/22/23. Echo post-op with EF 50% and nl RV. Pre-AVR echo in 6/25 showed EF 20-25%, RV normal, mild AI, severe AS LFLG, AoV mean gradient 21.0 mmHg.   After AVR, patient struggled with significant lower extremity edema. He was discharged home with unna boots and torsemide . Attempts were made to have home health nursing and PT see him, however they were unable to visit since he lives in [place] .  He was seen 10/05/23 for post-op follow up in [facility] with continued significant lower extremity volume + weeping and blisters. Admitted for IV diuresis, AHF consulted. Echo in 7/25 showed EF 45-50%, significant LV dyssynchrony, LV with global HK, RV mildly reduced, LA mildly dilated, trivial MR, normal structure and function of AV prosthesis. He was diuresed with IV lasix , and GDMT titrated. Received IV cefazolin  for venous stasis ulcers with possible infection. Hospitalization complicated by atrial fibrillation with RVR, loaded with amiodarone . He was discharged home, weight 197 lbs.  Post-op echo 11/08/23 showed EF 50-55%, RV mildly reduced systolic function, stable AoV prosthesis with mean gradient 7 mmHg.   Today he returns for HF follow up. He is more SOB with new orthopnea. He has a cough, no fever. He feels occasional chest pinch. Legs are swelling. Denies palpitations, abnormal bleeding, CP, dizziness, or PND. Appetite ok. Weight at home 225-230 pounds. Taking all medications. Works full time (works with machines), completed CR.   ReDs reading: 40 %, abnormal  ECG (personally reviewed): NSR 73 bpm  Labs (8/25): K 5.3, creatinine 1.42, BNP 115, TSH normal, LFTs normal Labs (10/25): K 5.1, creatinine 1.54, TSH normal, LFTs normal  PMH: 1. Aortic stenosis: Bicuspid  aortic valve with severe AS.  S/p bioprosthetic AVR 6/25. AoV prosthesis functioning normally on post-op echoes.  2. Chronic systolic CHF: Nonischemic cardiomyopathy.  EF decreased in setting of severe AS. Suspect this was primarily as valvular cardiomyopathy as EF was improved post-op.  He also has LBBB.  - Echo (2/25): EF 35-40%, RV normal, severe AS with mean gradient 47 mmhg - R/LHC (3/25): Normal coronaries; RA 19, PA 70/38 (52), PCWP 33 with V waves to 45 mmHg, PVR 4.5 WU, PAPi 1.7, CO/CI (Fick) 4.2/2.1 - Echo (6/25): EF 20-25%, RV normal, mild AI, severe AS LFLG, AoV mean gradient 21.0 mmHg - Echo (7/25): EF 45-50%, significant LV dyssynchrony, LV with GHK, RV mildly reduced, LA mildly dilated, trivial MR, normal structure and function of AV prosthesis. - Echo (11/08/23):  EF 50-55%, RV mildly reduced,  stable AoV prosthesis with mean gradient 7 mmHg.  3. CKD stage 3 4. H/o back surgery  5. Type 2 diabetes 6. HTN  SH: Married with 3 kids, nonsmoker, lives in [place].  Works with heavy equipment.   Current Outpatient Medications  Medication Sig Dispense Refill   acetaminophen  (TYLENOL ) 325 MG tablet Take 2 tablets (650 mg total) by mouth every 6 (six) hours as needed for mild pain (pain score 1-3).     apixaban  (ELIQUIS ) 5 MG TABS tablet Take 1 tablet (5 mg total) by mouth 2 (two) times daily. 60 tablet 11   aspirin  81 MG chewable tablet Chew 1 tablet (81 mg total) by mouth daily.     atorvastatin  (LIPITOR) 40 MG tablet Take 1 tablet (40 mg  total) by mouth at bedtime. 90 tablet 3   gabapentin  (NEURONTIN ) 100 MG capsule Take 1 capsule (100 mg total) by mouth 2 (two) times daily. If you feel you no longer require this medication for pain please DO NOT stop abruptly. Please wean to 1 tablet daily for 1 week and then discontinue. 60 capsule 0   insulin  lispro (HUMALOG ) 100 UNIT/ML KwikPen Inject 16 Units into the skin 3 (three) times daily.     metoprolol  succinate (TOPROL  XL) 25 MG 24 hr  tablet Take 2 tablets (50 mg total) by mouth daily. 180 tablet 3   sacubitril -valsartan  (ENTRESTO ) 24-26 MG Take 1 tablet by mouth 2 (two) times daily. 60 tablet 11   torsemide  (DEMADEX ) 20 MG tablet Take 1 tablet (20 mg total) by mouth every other day. 45 tablet 2   TRESIBA FLEXTOUCH 200 UNIT/ML FlexTouch Pen Inject 54 Units into the skin daily.     sildenafil  (VIAGRA ) 50 MG tablet Take 1 tablet (50 mg total) by mouth daily as needed for erectile dysfunction. (Patient not taking: Reported on 04/03/2024) 10 tablet 1   No current facility-administered medications for this encounter.   Allergies  Allergen Reactions   Invokana [Canagliflozin] Other (See Comments)    Dizziness Syncope    Levaquin [Levofloxacin] Rash   Zestril  [Lisinopril ] Cough   Farxiga  [Dapagliflozin ] Other (See Comments)    dizziness   Family History  Problem Relation Age of Onset   Diabetes Mother    Diabetes Father    Heart disease Father    Heart attack Sister    Hypertension Sister    Heart attack Brother    Other Brother 58       Shot in the head   Other Brother 5       Polio   Diabetes Brother    Wt Readings from Last 3 Encounters:  04/03/24 111.3 kg (245 lb 6.4 oz)  02/20/24 106 kg (233 lb 9.6 oz)  01/17/24 104.3 kg (230 lb)   BP (!) 150/90   Pulse 85   Wt 111.3 kg (245 lb 6.4 oz)   SpO2 90%   BMI 40.84 kg/m   PHYSICAL EXAM: General:  NAD. No resp difficulty, walked into clinic, fatigued-appearing. HEENT: Normal Neck: Supple. JVP 10, thick neck Cor: Regular rate & rhythm. No rubs, gallops or murmurs. Lungs: Clear Abdomen: Obese, nontender, nondistended.  Extremities: No cyanosis, clubbing, rash, 2+ BLE edema Neuro: Alert & oriented x 3, moves all 4 extremities w/o difficulty. Affect pleasant.  ASSESSMENT & PLAN: 1. Chronic systolic CHF: Nonischemic cardiomyopathy.  Suspect valvular related to severe AS, LBBB also contributes. Cath 3/25 pre-AVR showed no significant CAD but markedly  elevated filling pressures.  Echo in 6/25 (pre-op) showed EF 20-25% with septal-lateral dyssynchrony, normal RV function, severe aortic stenosis.  Patient is now s/p bioprosthetic AVR.  Echo post-op 7/25 showed EF 45-50%, significant LV dyssynchrony, LV with GHK, RV mildly reduced systolic function, LA mildly dilated, trivial MR, normal structure and function of AV prosthesis. Echo in 8/25 showed  EF 50-55%, RV mildly reduced,  stable AoV prosthesis with mean gradient 7 mmHg. Worse NYHA III, he is volume overloaded today, REDs 40% and weight up 12 lbs.  - Increase torsemide  to 20 mg daily. BMET/BNP today, repeat BMET in 10-14 days. - Increase Entresto  to 49/51 mg bid. - Off spironolactone  with hyperkalemia.  - Continue Toprol  XL 50 mg daily. - Allergy to Farxiga  and Invokana (syncope/dizziness).  2. Aortic stenosis: Bicuspid aortic valve.  S/p bioprosthetic AVR in 6/25. Bioprosthetic valve appeared normal on 8/25 echo.  - Will need eventual screening of thoracic aorta for aneurysm given bicuspid AoV.  3. CKD stage 3: Creatinine baseline ~ 1.5-1.7.  - BMET today. 4. Venous stasis ulcers: Resolved.  5. Atrial fibrillation: Had AF with RVR during admission in 7/25. No further AF or palpitations noted on amiodarone . NSR on ECG today. - He is now off amiodarone . - Continue Toprol  XL 50 mg daily.  - Continue Eliquis  5 mg bid. No bleeding issues. CBC today 6. Obesity: Body mass index is 40.84 kg/m. - Cost was a barrier for GLP1 7. Fatigue: Likely multifactorial. - Check iron panel, discuss sleep study next visit.  Follow up in 3 weeks with APP and 4 months with Dr. Rolan [facility] FNP-BC  04/03/2024 "

## 2024-04-03 NOTE — Progress Notes (Signed)
"   ReDS Vest / Clip - 04/03/24 1500       ReDS Vest / Clip   Station Marker B    Ruler Value 40.5    ReDS Value Range Moderate volume overload    ReDS Actual Value 40          "

## 2024-04-04 ENCOUNTER — Telehealth (HOSPITAL_COMMUNITY): Payer: Self-pay

## 2024-04-04 ENCOUNTER — Ambulatory Visit (HOSPITAL_COMMUNITY)
Admission: RE | Admit: 2024-04-04 | Discharge: 2024-04-04 | Disposition: A | Discharge status: Home / Self Care | Source: Ambulatory Visit | Attending: Cardiology | Admitting: Cardiology

## 2024-04-04 ENCOUNTER — Ambulatory Visit (HOSPITAL_COMMUNITY): Payer: Self-pay | Admitting: Family Medicine

## 2024-04-04 ENCOUNTER — Other Ambulatory Visit (HOSPITAL_COMMUNITY): Payer: Self-pay

## 2024-04-04 DIAGNOSIS — I5022 Chronic systolic (congestive) heart failure: Secondary | ICD-10-CM

## 2024-04-04 LAB — BASIC METABOLIC PANEL WITH GFR
Anion gap: 8 (ref 5–15)
BUN: 45 mg/dL — ABNORMAL HIGH (ref 6–20)
CO2: 29 mmol/L (ref 22–32)
Calcium: 9.3 mg/dL (ref 8.9–10.3)
Chloride: 100 mmol/L (ref 98–111)
Creatinine, Ser: 1.25 mg/dL — ABNORMAL HIGH (ref 0.61–1.24)
GFR, Estimated: >60 mL/min
Glucose, Bld: 117 mg/dL — ABNORMAL HIGH (ref 70–99)
Potassium: 5.2 mmol/L — ABNORMAL HIGH (ref 3.5–5.1)
Sodium: 137 mmol/L (ref 135–145)

## 2024-04-04 MED ORDER — SACUBITRIL-VALSARTAN 24-26 MG PO TABS: 1.0000 | ORAL_TABLET | Freq: Two times a day (BID) | ORAL | 11 refills | Status: DC

## 2024-04-04 NOTE — Telephone Encounter (Signed)
 Pts wife aware repeat labs are needed. Reports pt is at work and will try to contact about results

## 2024-04-04 NOTE — Telephone Encounter (Signed)
 Advanced Heart Failure Patient Advocate Encounter  Test billing for this patient's current coverage (PRXBMS) returns a $0 copay for 90 day supply of Lokelma .  This test claim was processed through  [facility]- copay amounts may vary at other pharmacies due to pharmacy/plan contracts, or as the patient moves through the different stages of their insurance plan.  Rachel DEL, CPhT Rx Patient Advocate Phone: [phone]

## 2024-04-09 ENCOUNTER — Telehealth (HOSPITAL_COMMUNITY): Payer: Self-pay | Admitting: Cardiology

## 2024-04-09 NOTE — Telephone Encounter (Signed)
 Patient called to report blister on L lower leg from fluid  Reports fluid has went down since changing medications. No weights Report LE edema has went down  Denies redness, warm to the touch Reports similar swelling/blister while in the hospital with fluid   Wanted to notify provider, and ask if anything could be done about it (creams or medication)

## 2024-04-12 ENCOUNTER — Other Ambulatory Visit (HOSPITAL_COMMUNITY): Payer: Self-pay | Admitting: Cardiology

## 2024-04-12 NOTE — Telephone Encounter (Signed)
 Patient report blister is better Declines referral Reports he has had [facility]  Nothing to address at this time

## 2024-04-17 NOTE — Telephone Encounter (Signed)
 Attempted to contact pt x 3 mailbox full unable to leave message. Note left for upcoming appt

## 2024-04-17 NOTE — Telephone Encounter (Signed)
 Pt aware and voiced understanding  Reports he does not need referral at this time Will call back if needed

## 2024-04-27 NOTE — Progress Notes (Signed)
 "  ADVANCED HF CLINIC NOTE   Primary Care: Rosamond Leta NOVAK, MD HF Cardiologist: Dr. Rolan  HPI: 59 y.o. male with history of HFrEF, HTN, T2DM, CKD 3, and severe bicuspid aortic stenosis s/p bioprothestic AVR with Dr. Kerrin on 09/22/23. Echo post-op with EF 50% and nl RV. Pre-AVR echo in 6/25 showed EF 20-25%, RV normal, mild AI, severe AS LFLG, AoV mean gradient 21.0 mmHg.   After AVR, patient struggled with significant lower extremity edema. He was discharged home with unna boots and torsemide . Attempts were made to have home health nursing and PT see him, however they were unable to visit since he lives in [place] .  He was seen 10/05/23 for post-op follow up in [facility] with continued significant lower extremity volume + weeping and blisters. Admitted for IV diuresis, AHF consulted. Echo in 7/25 showed EF 45-50%, significant LV dyssynchrony, LV with global HK, RV mildly reduced, LA mildly dilated, trivial MR, normal structure and function of AV prosthesis. He was diuresed with IV lasix , and GDMT titrated. Received IV cefazolin  for venous stasis ulcers with possible infection. Hospitalization complicated by atrial fibrillation with RVR, loaded with amiodarone . He was discharged home, weight 197 lbs.  Post-op echo 11/08/23 showed EF 50-55%, RV mildly reduced systolic function, stable AoV prosthesis with mean gradient 7 mmHg.   Today he returns for HF follow up. Overall feeling fair, main complaint is on-going fatigue since his AVR and ED. Legs, feet and hips hurt, sees Ortho. 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 Has new 3 pillow orthopnea. Denies palpitations, abnormal bleeding, CP, dizziness, edema, or PND. Appetite ok. Weight at home 228 pounds. Taking all medications. Completed CR.   ReDs reading: 35%, normal  ECG (personally reviewed): none ordered today  Labs (8/25): K 5.3, creatinine 1.42, BNP 115, TSH normal, LFTs normal Labs (10/25): K 5.1, creatinine 1.54,  TSH normal, LFTs normal Labs (12/25): K 5.2, creatinine 1.25  PMH: 1. Aortic stenosis: Bicuspid aortic valve with severe AS.  S/p bioprosthetic AVR 6/25. AoV prosthesis functioning normally on post-op echoes.  2. Chronic systolic CHF: Nonischemic cardiomyopathy.  EF decreased in setting of severe AS. Suspect this was primarily as valvular cardiomyopathy as EF was improved post-op.  He also has LBBB.  - Echo (2/25): EF 35-40%, RV normal, severe AS with mean gradient 47 mmhg - R/LHC (3/25): Normal coronaries; RA 19, PA 70/38 (52), PCWP 33 with V waves to 45 mmHg, PVR 4.5 WU, PAPi 1.7, CO/CI (Fick) 4.2/2.1 - Echo (6/25): EF 20-25%, RV normal, mild AI, severe AS LFLG, AoV mean gradient 21.0 mmHg - Echo (7/25): EF 45-50%, significant LV dyssynchrony, LV with GHK, RV mildly reduced, LA mildly dilated, trivial MR, normal structure and function of AV prosthesis. - Echo (11/08/23):  EF 50-55%, RV mildly reduced,  stable AoV prosthesis with mean gradient 7 mmHg.  3. CKD stage 3 4. H/o back surgery  5. Type 2 diabetes 6. HTN  SH: Married with 3 kids, nonsmoker, lives in [place].  Works with heavy equipment.   Current Outpatient Medications  Medication Sig Dispense Refill   acetaminophen  (TYLENOL ) 325 MG tablet Take 2 tablets (650 mg total) by mouth every 6 (six) hours as needed for mild pain (pain score 1-3).     apixaban  (ELIQUIS ) 5 MG TABS tablet Take 1 tablet (5 mg total) by mouth 2 (two) times daily. 60 tablet 11   aspirin  81 MG chewable tablet Chew 1 tablet (81 mg total) by mouth daily.  atorvastatin  (LIPITOR) 40 MG tablet Take 1 tablet (40 mg total) by mouth at bedtime. 90 tablet 3   insulin  lispro (HUMALOG ) 100 UNIT/ML KwikPen Inject 16 Units into the skin 3 (three) times daily.     metoprolol  succinate (TOPROL  XL) 25 MG 24 hr tablet Take 2 tablets (50 mg total) by mouth daily. 180 tablet 3   sacubitril -valsartan  (ENTRESTO ) 24-26 MG Take 1 tablet by mouth 2 (two) times daily. 60 tablet 11    sildenafil  (VIAGRA ) 50 MG tablet Take 1 tablet (50 mg total) by mouth daily as needed for erectile dysfunction. 10 tablet 1   torsemide  (DEMADEX ) 20 MG tablet Take 1 tablet (20 mg total) by mouth daily. 45 tablet 2   TRESIBA FLEXTOUCH 200 UNIT/ML FlexTouch Pen Inject 54 Units into the skin daily.     No current facility-administered medications for this encounter.   Allergies  Allergen Reactions   Invokana [Canagliflozin] Other (See Comments)    Dizziness Syncope    Levaquin [Levofloxacin] Rash   Zestril  [Lisinopril ] Cough   Farxiga  [Dapagliflozin ] Other (See Comments)    dizziness   Family History  Problem Relation Age of Onset   Diabetes Mother    Diabetes Father    Heart disease Father    Heart attack Sister    Hypertension Sister    Heart attack Brother    Other Brother 15       Shot in the head   Other Brother 5       Polio   Diabetes Brother    Wt Readings from Last 3 Encounters:  05/01/24 111.4 kg (245 lb 9.6 oz)  04/03/24 111.3 kg (245 lb 6.4 oz)  02/20/24 106 kg (233 lb 9.6 oz)   BP 118/84   Pulse 76   Ht 5' 5 (1.651 m)   Wt 111.4 kg (245 lb 9.6 oz)   SpO2 97%   BMI 40.87 kg/m   PHYSICAL EXAM: General:  NAD. No resp difficulty, walked into clinic HEENT: Normal Neck: Supple. No JVD. Thick neck Cor: Regular rate & rhythm. No rubs, gallops or murmurs. Lungs: Clear Abdomen: Obese, nontender, nondistended.  Extremities: No cyanosis, clubbing, rash, edema Neuro: Alert & oriented x 3, moves all 4 extremities w/o difficulty. Affect pleasant.  ASSESSMENT & PLAN: 1. Chronic systolic CHF: Nonischemic cardiomyopathy.  Suspect valvular related to severe AS, LBBB also contributes. Cath 3/25 pre-AVR showed no significant CAD but markedly elevated filling pressures.  Echo in 6/25 (pre-op) showed EF 20-25% with septal-lateral dyssynchrony, normal RV function, severe aortic stenosis.  Patient is now s/p bioprosthetic AVR.  Echo post-op 7/25 showed EF 45-50%,  significant LV dyssynchrony, LV with GHK, RV mildly reduced systolic function, LA mildly dilated, trivial MR, normal structure and function of AV prosthesis. Echo in 8/25 showed  EF 50-55%, RV mildly reduced,  stable AoV prosthesis with mean gradient 7 mmHg. NYHA II, suspect current symptoms related to body habitus and inactivity. He is not volume overloaded on exam, ReDs 35%. - Continue torsemide  20 mg daily. BMET today. - Continue Entresto  24/26 mg bid. - Off spironolactone  with hyperkalemia.  - Continue Toprol  XL 50 mg daily. - Allergy to Farxiga  and Invokana (syncope/dizziness).  - Update echo 2. Aortic stenosis: Bicuspid aortic valve.  S/p bioprosthetic AVR in 6/25. Bioprosthetic valve appeared normal on 8/25 echo.  - Will need eventual screening of thoracic aorta for aneurysm given bicuspid AoV.  3. CKD stage 3: Creatinine baseline ~ 1.5-1.7.  - BMET today. 4. Venous stasis  ulcers: Resolved. 5. Atrial fibrillation: Had AF with RVR during admission in 7/25. No further AF or palpitations noted on amiodarone , now off amiodarone . Regular on exam today. - Continue Toprol  XL 50 mg daily.  - Continue Eliquis  5 mg bid. No bleeding issues.   6. Obesity: Body mass index is 40.87 kg/m. - Cost was a barrier for GLP1 7. Fatigue: Likely multifactorial. He has daytime fatigue and sleepiness, he says he does not snore. States fatigue has been on-going since his AVR. - We discussed sleep study today, however he declines. - I encouraged him to get back into the gym for dedicated exercise routine. - Recent CBC, TSH and iron panel stable. - update echo  Follow up in 4 months with Dr. Rolan [facility] FNP-BC  05/01/24 "

## 2024-05-01 ENCOUNTER — Ambulatory Visit (HOSPITAL_COMMUNITY)
Admission: RE | Admit: 2024-05-01 | Discharge: 2024-05-01 | Disposition: A | Source: Ambulatory Visit | Attending: Family Medicine

## 2024-05-01 ENCOUNTER — Encounter (HOSPITAL_COMMUNITY): Payer: Self-pay

## 2024-05-01 VITALS — BP 118/84 | HR 76 | Ht 65.0 in | Wt 245.6 lb

## 2024-05-01 DIAGNOSIS — I350 Nonrheumatic aortic (valve) stenosis: Secondary | ICD-10-CM | POA: Diagnosis not present

## 2024-05-01 DIAGNOSIS — Z794 Long term (current) use of insulin: Secondary | ICD-10-CM | POA: Diagnosis not present

## 2024-05-01 DIAGNOSIS — Z7982 Long term (current) use of aspirin: Secondary | ICD-10-CM | POA: Insufficient documentation

## 2024-05-01 DIAGNOSIS — I878 Other specified disorders of veins: Secondary | ICD-10-CM | POA: Diagnosis not present

## 2024-05-01 DIAGNOSIS — E669 Obesity, unspecified: Secondary | ICD-10-CM | POA: Diagnosis not present

## 2024-05-01 DIAGNOSIS — I5022 Chronic systolic (congestive) heart failure: Secondary | ICD-10-CM | POA: Diagnosis not present

## 2024-05-01 DIAGNOSIS — Z6841 Body Mass Index (BMI) 40.0 and over, adult: Secondary | ICD-10-CM | POA: Insufficient documentation

## 2024-05-01 DIAGNOSIS — Z952 Presence of prosthetic heart valve: Secondary | ICD-10-CM | POA: Insufficient documentation

## 2024-05-01 DIAGNOSIS — N183 Chronic kidney disease, stage 3 unspecified: Secondary | ICD-10-CM | POA: Diagnosis not present

## 2024-05-01 DIAGNOSIS — I4891 Unspecified atrial fibrillation: Secondary | ICD-10-CM | POA: Diagnosis not present

## 2024-05-01 DIAGNOSIS — I447 Left bundle-branch block, unspecified: Secondary | ICD-10-CM | POA: Diagnosis not present

## 2024-05-01 DIAGNOSIS — I130 Hypertensive heart and chronic kidney disease with heart failure and stage 1 through stage 4 chronic kidney disease, or unspecified chronic kidney disease: Secondary | ICD-10-CM | POA: Diagnosis present

## 2024-05-01 DIAGNOSIS — I428 Other cardiomyopathies: Secondary | ICD-10-CM | POA: Diagnosis not present

## 2024-05-01 DIAGNOSIS — Z79899 Other long term (current) drug therapy: Secondary | ICD-10-CM | POA: Diagnosis not present

## 2024-05-01 DIAGNOSIS — I480 Paroxysmal atrial fibrillation: Secondary | ICD-10-CM | POA: Diagnosis not present

## 2024-05-01 DIAGNOSIS — Z7901 Long term (current) use of anticoagulants: Secondary | ICD-10-CM | POA: Diagnosis not present

## 2024-05-01 DIAGNOSIS — R5383 Other fatigue: Secondary | ICD-10-CM

## 2024-05-01 DIAGNOSIS — E1122 Type 2 diabetes mellitus with diabetic chronic kidney disease: Secondary | ICD-10-CM | POA: Diagnosis not present

## 2024-05-01 LAB — BASIC METABOLIC PANEL WITH GFR
Anion gap: 8 (ref 5–15)
BUN: 46 mg/dL — ABNORMAL HIGH (ref 6–20)
CO2: 30 mmol/L (ref 22–32)
Calcium: 9.2 mg/dL (ref 8.9–10.3)
Chloride: 98 mmol/L (ref 98–111)
Creatinine, Ser: 2.09 mg/dL — ABNORMAL HIGH (ref 0.61–1.24)
GFR, Estimated: 36 mL/min — ABNORMAL LOW
Glucose, Bld: 127 mg/dL — ABNORMAL HIGH (ref 70–99)
Potassium: 5.6 mmol/L — ABNORMAL HIGH (ref 3.5–5.1)
Sodium: 136 mmol/L (ref 135–145)

## 2024-05-01 NOTE — Patient Instructions (Addendum)
 Good to  see you today!   Lokelma  sample given today take ONLY if we call you  Labs done today, your results will be available in MyChart, we will contact you for abnormal readings.  Your physician has requested that you have an echocardiogram. Echocardiography is a painless test that uses sound waves to create images of your heart. It provides your doctor with information about the size and shape of your heart and how well your hearts chambers and valves are working. This procedure takes approximately one hour. There are no restrictions for this procedure. Please do NOT wear cologne, perfume, aftershave, or lotions (deodorant is allowed). Please arrive 15 minutes prior to your appointment time.  Please note: We ask at that you not bring children with you during ultrasound (echo/ vascular) testing. Due to room size and safety concerns, children are not allowed in the ultrasound rooms during exams. Our front office staff cannot provide observation of children in our lobby area while testing is being conducted. An adult accompanying a patient to their appointment will only be allowed in the ultrasound room at the discretion of the ultrasound technician under special circumstances. We apologize for any inconvenience.  Your physician recommends that you schedule a follow-up appointment as scheduled  If you have any questions or concerns before your next appointment please send us  a message through [place] or call our office at [phone].    TO LEAVE A MESSAGE FOR THE NURSE SELECT OPTION 2, PLEASE LEAVE A MESSAGE INCLUDING: YOUR NAME DATE OF BIRTH CALL BACK NUMBER REASON FOR CALL**this is important as we prioritize the call backs  YOU WILL RECEIVE A CALL BACK THE SAME DAY AS LONG AS YOU CALL BEFORE 4:00 PM At the [facility], you and your health needs are our priority. As part of our continuing mission to provide you with exceptional heart care, we have created designated  Provider Care Teams. These Care Teams include your primary Cardiologist (physician) and Advanced Practice Providers (APPs- Physician Assistants and Nurse Practitioners) who all work together to provide you with the care you need, when you need it.   You may see any of the following providers on your designated Care Team at your next follow up: Dr Toribio Fuel Dr Ezra Shuck Dr. Morene Brownie Greig Mosses, NP Caffie Shed, [place] [facility] [place], [place] Beckey Coe, NP Jordan Lee, NP Ellouise Class, NP Tinnie Redman, PharmD Jaun Bash, PharmD   Please be sure to bring in all your medications bottles to every appointment.    Thank you for choosing [facility]

## 2024-05-01 NOTE — Addendum Note (Signed)
 Encounter addended by: Genni Buske M, RN on: 05/01/2024 4:08 PM  Actions taken: Clinical Note Signed

## 2024-05-01 NOTE — Progress Notes (Signed)
 Medication Samples have been provided to the patient.  Drug name: Lokelma        Strength: 10 g        Qty: 1  LOT: [id number]  Exp.Date: 09/02/2024  Dosing instructions: take only when called by heart failure clinic  The patient has been instructed regarding the correct time, dose, and frequency of taking this medication, including desired effects and most common side effects.   Jonathon Bray 4:05 PM 05/01/2024

## 2024-05-01 NOTE — Progress Notes (Signed)
"   ReDS Vest / Clip - 05/01/24 1500       ReDS Vest / Clip   Station Marker B    Ruler Value 37    ReDS Value Range Low volume    ReDS Actual Value 35          "

## 2024-05-02 ENCOUNTER — Other Ambulatory Visit (HOSPITAL_COMMUNITY): Payer: Self-pay | Admitting: Cardiology

## 2024-05-02 ENCOUNTER — Ambulatory Visit (HOSPITAL_COMMUNITY): Payer: Self-pay | Admitting: Family Medicine

## 2024-05-02 DIAGNOSIS — I5022 Chronic systolic (congestive) heart failure: Secondary | ICD-10-CM

## 2024-05-02 DIAGNOSIS — I350 Nonrheumatic aortic (valve) stenosis: Secondary | ICD-10-CM

## 2024-05-02 MED ORDER — TORSEMIDE 20 MG PO TABS: ORAL_TABLET | ORAL | 2 refills | Status: AC

## 2024-05-09 ENCOUNTER — Telehealth (HOSPITAL_COMMUNITY): Payer: Self-pay | Admitting: Cardiology

## 2024-05-09 NOTE — Telephone Encounter (Signed)
 Patient called to inform provider he fell a few days ago, reports he landed directly on his chest. Reports he recently had surgery done on his chest.  He was seen by ER and told he may have a bruised rib. Reports since fall increase in bilateral foot pain.  Denies CP,weight gain, or SOB  Reports he only wanted to notify provider since he comes in for echo 2/11  Advised to also contact PCP for foot pain

## 2024-05-16 ENCOUNTER — Ambulatory Visit (HOSPITAL_COMMUNITY)

## 2024-07-31 ENCOUNTER — Ambulatory Visit (HOSPITAL_COMMUNITY): Admitting: Cardiology
